# Patient Record
Sex: Male | Born: 1940
Health system: Southern US, Community
[De-identification: ages and names within clinical notes are randomized; demographics above are authoritative.]

## PROBLEM LIST (undated history)

## (undated) DIAGNOSIS — IMO0001 Reserved for inherently not codable concepts without codable children: Secondary | ICD-10-CM

## (undated) DIAGNOSIS — Z933 Colostomy status: Secondary | ICD-10-CM

## (undated) DIAGNOSIS — N400 Enlarged prostate without lower urinary tract symptoms: Secondary | ICD-10-CM

## (undated) DIAGNOSIS — K439 Ventral hernia without obstruction or gangrene: Secondary | ICD-10-CM

## (undated) DIAGNOSIS — K5720 Diverticulitis of large intestine with perforation and abscess without bleeding: Secondary | ICD-10-CM

## (undated) DIAGNOSIS — M1990 Unspecified osteoarthritis, unspecified site: Secondary | ICD-10-CM

## (undated) DIAGNOSIS — G4733 Obstructive sleep apnea (adult) (pediatric): Secondary | ICD-10-CM

## (undated) DIAGNOSIS — IMO0002 Reserved for concepts with insufficient information to code with codable children: Secondary | ICD-10-CM

## (undated) DIAGNOSIS — Z9989 Dependence on other enabling machines and devices: Secondary | ICD-10-CM

## (undated) DIAGNOSIS — R011 Cardiac murmur, unspecified: Secondary | ICD-10-CM

## (undated) DIAGNOSIS — J449 Chronic obstructive pulmonary disease, unspecified: Secondary | ICD-10-CM

## (undated) DIAGNOSIS — J189 Pneumonia, unspecified organism: Secondary | ICD-10-CM

## (undated) DIAGNOSIS — J329 Chronic sinusitis, unspecified: Secondary | ICD-10-CM

## (undated) DIAGNOSIS — E11621 Type 2 diabetes mellitus with foot ulcer: Secondary | ICD-10-CM

## (undated) DIAGNOSIS — L97509 Non-pressure chronic ulcer of other part of unspecified foot with unspecified severity: Secondary | ICD-10-CM

## (undated) DIAGNOSIS — G629 Polyneuropathy, unspecified: Secondary | ICD-10-CM

## (undated) HISTORY — DX: Dependence on other enabling machines and devices: Z99.89

## (undated) HISTORY — PX: CARDIAC CATHETERIZATION: SHX172

## (undated) HISTORY — PX: OTHER SURGICAL HISTORY: SHX169

## (undated) HISTORY — DX: Obstructive sleep apnea (adult) (pediatric): G47.33

## (undated) HISTORY — PX: INCISE AND DRAIN ABCESS: PRO64

## (undated) HISTORY — DX: Benign prostatic hyperplasia without lower urinary tract symptoms: N40.0

## (undated) HISTORY — PX: CHOLECYSTECTOMY: SHX55

## (undated) SURGERY — Surgical Case
Anesthesia: *Unknown

---

## 2002-02-25 ENCOUNTER — Encounter: Payer: Self-pay | Admitting: Otolaryngology

## 2002-02-25 ENCOUNTER — Encounter: Admission: RE | Admit: 2002-02-25 | Discharge: 2002-02-25 | Payer: Self-pay | Admitting: Otolaryngology

## 2004-11-25 ENCOUNTER — Encounter: Admission: RE | Admit: 2004-11-25 | Discharge: 2004-11-25 | Payer: Self-pay | Admitting: Family Medicine

## 2006-05-08 ENCOUNTER — Ambulatory Visit: Payer: Self-pay

## 2009-08-28 ENCOUNTER — Ambulatory Visit: Payer: Self-pay | Admitting: Family Medicine

## 2009-08-28 DIAGNOSIS — R7309 Other abnormal glucose: Secondary | ICD-10-CM | POA: Insufficient documentation

## 2009-08-28 DIAGNOSIS — N400 Enlarged prostate without lower urinary tract symptoms: Secondary | ICD-10-CM | POA: Insufficient documentation

## 2009-08-28 DIAGNOSIS — G4733 Obstructive sleep apnea (adult) (pediatric): Secondary | ICD-10-CM | POA: Insufficient documentation

## 2009-10-23 ENCOUNTER — Emergency Department: Payer: Self-pay

## 2009-10-24 ENCOUNTER — Inpatient Hospital Stay (HOSPITAL_COMMUNITY): Admission: EM | Admit: 2009-10-24 | Discharge: 2009-10-26 | Payer: Self-pay | Admitting: General Surgery

## 2009-11-02 ENCOUNTER — Ambulatory Visit (HOSPITAL_BASED_OUTPATIENT_CLINIC_OR_DEPARTMENT_OTHER): Admission: RE | Admit: 2009-11-02 | Discharge: 2009-11-03 | Payer: Self-pay | Admitting: Orthopedic Surgery

## 2009-11-03 ENCOUNTER — Telehealth: Payer: Self-pay | Admitting: Family Medicine

## 2009-11-05 ENCOUNTER — Telehealth (INDEPENDENT_AMBULATORY_CARE_PROVIDER_SITE_OTHER): Payer: Self-pay

## 2009-11-05 ENCOUNTER — Emergency Department (HOSPITAL_COMMUNITY): Admission: EM | Admit: 2009-11-05 | Discharge: 2009-11-05 | Payer: Self-pay | Admitting: Emergency Medicine

## 2009-11-05 ENCOUNTER — Telehealth: Payer: Self-pay | Admitting: Family Medicine

## 2010-02-26 ENCOUNTER — Ambulatory Visit: Payer: Self-pay | Admitting: Family Medicine

## 2010-02-26 DIAGNOSIS — G8921 Chronic pain due to trauma: Secondary | ICD-10-CM | POA: Insufficient documentation

## 2010-03-01 LAB — CONVERTED CEMR LAB
AST: 18 units/L (ref 0–37)
Albumin: 4 g/dL (ref 3.5–5.2)
BUN: 21 mg/dL (ref 6–23)
CO2: 24 meq/L (ref 19–32)
Calcium: 9.1 mg/dL (ref 8.4–10.5)
Chloride: 106 meq/L (ref 96–112)
Cholesterol: 197 mg/dL (ref 0–200)
Glucose, Bld: 108 mg/dL — ABNORMAL HIGH (ref 70–99)
HDL: 45 mg/dL (ref >39)
Hgb A1c MFr Bld: 5.8 % — ABNORMAL HIGH (ref <5.7)
Potassium: 3.9 meq/L (ref 3.5–5.3)

## 2010-03-03 ENCOUNTER — Ambulatory Visit: Payer: Self-pay | Admitting: Family Medicine

## 2010-03-03 DIAGNOSIS — J018 Other acute sinusitis: Secondary | ICD-10-CM

## 2010-03-15 ENCOUNTER — Telehealth: Payer: Self-pay | Admitting: Family Medicine

## 2010-03-26 ENCOUNTER — Encounter: Payer: Self-pay | Admitting: Family Medicine

## 2010-03-29 ENCOUNTER — Telehealth: Payer: Self-pay | Admitting: Family Medicine

## 2010-04-01 ENCOUNTER — Telehealth: Payer: Self-pay | Admitting: Family Medicine

## 2010-04-28 ENCOUNTER — Telehealth: Payer: Self-pay | Admitting: Family Medicine

## 2010-05-18 ENCOUNTER — Telehealth: Payer: Self-pay | Admitting: Family Medicine

## 2010-05-28 ENCOUNTER — Telehealth: Payer: Self-pay | Admitting: Family Medicine

## 2010-06-23 ENCOUNTER — Telehealth: Payer: Self-pay | Admitting: Family Medicine

## 2010-06-25 ENCOUNTER — Telehealth: Payer: Self-pay | Admitting: Family Medicine

## 2010-07-01 ENCOUNTER — Ambulatory Visit: Payer: Self-pay | Admitting: Family Medicine

## 2010-07-01 DIAGNOSIS — E785 Hyperlipidemia, unspecified: Secondary | ICD-10-CM

## 2010-07-02 ENCOUNTER — Encounter: Payer: Self-pay | Admitting: Family Medicine

## 2010-07-02 LAB — CONVERTED CEMR LAB
ALT: 22 units/L (ref 0–53)
AST: 20 units/L (ref 0–37)
Albumin: 3.9 g/dL (ref 3.5–5.2)
Alkaline Phosphatase: 54 units/L (ref 39–117)
Direct LDL: 114.7 mg/dL
Total CHOL/HDL Ratio: 5
VLDL: 42.4 mg/dL — ABNORMAL HIGH (ref 0.0–40.0)

## 2010-07-23 ENCOUNTER — Telehealth: Payer: Self-pay | Admitting: Family Medicine

## 2010-08-18 ENCOUNTER — Telehealth: Payer: Self-pay | Admitting: Family Medicine

## 2010-09-17 ENCOUNTER — Telehealth: Payer: Self-pay | Admitting: Family Medicine

## 2010-10-13 ENCOUNTER — Ambulatory Visit
Admission: RE | Admit: 2010-10-13 | Discharge: 2010-10-13 | Payer: Self-pay | Source: Home / Self Care | Attending: Family Medicine | Admitting: Family Medicine

## 2010-10-13 DIAGNOSIS — M2140 Flat foot [pes planus] (acquired), unspecified foot: Secondary | ICD-10-CM | POA: Insufficient documentation

## 2010-10-14 ENCOUNTER — Ambulatory Visit
Admission: RE | Admit: 2010-10-14 | Discharge: 2010-10-14 | Payer: Self-pay | Source: Home / Self Care | Attending: Family Medicine | Admitting: Family Medicine

## 2010-10-14 DIAGNOSIS — Q666 Other congenital valgus deformities of feet: Secondary | ICD-10-CM | POA: Insufficient documentation

## 2010-10-14 DIAGNOSIS — M2020 Hallux rigidus, unspecified foot: Secondary | ICD-10-CM | POA: Insufficient documentation

## 2010-10-14 DIAGNOSIS — G5760 Lesion of plantar nerve, unspecified lower limb: Secondary | ICD-10-CM | POA: Insufficient documentation

## 2010-10-14 DIAGNOSIS — M7750 Other enthesopathy of unspecified foot: Secondary | ICD-10-CM | POA: Insufficient documentation

## 2010-10-14 DIAGNOSIS — M76829 Posterior tibial tendinitis, unspecified leg: Secondary | ICD-10-CM | POA: Insufficient documentation

## 2010-10-20 ENCOUNTER — Telehealth: Payer: Self-pay | Admitting: Family Medicine

## 2010-11-11 NOTE — Assessment & Plan Note (Signed)
Summary: F/U AND TALK ABOUT BLOOD WORK/DLO   Vital Signs:  Patient profile:   70 year old male Height:      72 inches Weight:      242.13 pounds BMI:     32.96 Temp:     97.8 degrees F oral Pulse rate:   76 / minute Pulse rhythm:   regular BP sitting:   140 / 72  (left arm) Cuff size:   regular  Vitals Entered By: Linde Gillis CMA Duncan Dull) (Feb 26, 2010 3:22 PM) CC: follow-up visit   History of Present Illness: 70 yo here for multiple concerns.   1.  Borderline DM- was started on Metformin 500 mg two times a day last year.  Has never checked his blood sugar but has a meter at home.  We never received his old records.  Denies any symptoms of hypoglycemia.  2.  Chronic pain- fell off of the back of a truck in January.  Had bilateral hand surgery and shoulder surgery.  Still having terrible pain and difficulty sleeping.  Having to take Vicodin and Lunesta at night, which make him feel very drugged and sleepy the following morning. Still in PT, feels it is helping.      Current Medications (verified): 1)  Flomax 0.4 Mg Caps (Tamsulosin Hcl) .... Take 2 Tablet By Mouth At At Bedtime 2)  Aleve 220 Mg Tabs (Naproxen Sodium) .... As Needed For Arthritis 3)  Metformin Hcl 500 Mg Tabs (Metformin Hcl) .... Take 1 Tab Twice Daily 4)  Vicodin 5-500 Mg Tabs (Hydrocodone-Acetaminophen) .... Take One Tablet By Mouth Every 4-6 Hours As Needed For Pain 5)  Tramadol Hcl 50 Mg  Tabs (Tramadol Hcl) .Marland Kitchen.. 1 Tab By Mouth Two Times A Day As Needed Pain  Allergies (verified): No Known Drug Allergies  Review of Systems      See HPI General:  Complains of fatigue. Endo:  Denies excessive thirst and excessive urination.  Physical Exam  General:  Well-developed,well-nourished,in no acute distress; alert,appropriate and cooperative throughout examination Psych:  Cognition and judgment appear intact. Alert and cooperative with normal attention span and concentration. No apparent delusions, illusions,  hallucinations   Impression & Recommendations:  Problem # 1:  CHRONIC PAIN DUE TO TRAUMA (ICD-338.21) Assessment Deteriorated Time spent with patient 25 minutes, more than 50% of this time was spent counseling patient on chronic pain.  I do not have records from [facility] or from hand/shoulder surgeon.  Wife will get them for me. I explained that VIcodin and LUnesta combination are likely to blame for him feeling so fatigued all day. Discussed several options, agreed to try Tramadol and if necessary add Lunesta to Tramadol.  Less side effects, less addiction potential. Check BMET, LFTs today since he has been on Vicodin since January. Orders: Prescription Created Electronically [phone])  Problem # 2:  DIABETES MELLITUS, BORDERLINE (ICD-790.29) recheck a1c today. His updated medication list for this problem includes:    Metformin Hcl 500 Mg Tabs (Metformin hcl) .Marland Kitchen... Take 1 tab twice daily  Complete Medication List: 1)  Flomax 0.4 Mg Caps (Tamsulosin hcl) .... Take 2 tablet by mouth at at bedtime 2)  Aleve 220 Mg Tabs (Naproxen sodium) .... As needed for arthritis 3)  Metformin Hcl 500 Mg Tabs (Metformin hcl) .... Take 1 tab twice daily 4)  Vicodin 5-500 Mg Tabs (Hydrocodone-acetaminophen) .... Take one tablet by mouth every 4-6 hours as needed for pain 5)  Tramadol Hcl 50 Mg Tabs (Tramadol hcl) .Marland KitchenMarland KitchenMarland Kitchen  1 tab by mouth two times a day as needed pain  Patient Instructions: 1)  Good to see you. 2)  Let's try the Tramadol with the Lunesta, hold off on the Vicodin. 3)  labwork: fasting lipid, hepatic panel (V77.91), BMET (V58.69), a1c (250.00) Prescriptions: TRAMADOL HCL 50 MG  TABS (TRAMADOL HCL) 1 tab by mouth two times a day as needed pain  #60 x 0   Entered and Authorized by:   Ruthe Mannan MD   Signed by:   Ruthe Mannan MD on 02/26/2010   Method used:   [facility]        [facility] [phone][facility] (retail)       [address]       [place], [place]  [address]       Ph: [phone] or [phone]       Fax: [phone]   RxID:   [id number]   Current Allergies (reviewed today): No known allergies   Appended Document: F/U AND TALK ABOUT BLOOD WORK/DLO

## 2010-11-11 NOTE — Assessment & Plan Note (Signed)
Summary: MED FOLLOWUP/RBH   Vital Signs:  Patient profile:   70 year old male Height:      72 inches Weight:      236 pounds BMI:     32.12 Temp:     98.2 degrees F oral Pulse rate:   80 / minute Pulse rhythm:   regular BP sitting:   122 / 70  (right arm) Cuff size:   regular  Vitals Entered By: Linde Gillis CMA Duncan Dull) (July 01, 2010 12:15 PM) CC: medication follow up   History of Present Illness: 70 yo here for follow pain, DM, and HLD.   1.  Borderline DM- was started on Metformin 500 mg two times a day last year.  Has never checked his blood sugar but has a meter at home but backed down to 500 mg daily because was feeling a litte shaky.  No syncope, nausea, or vomiting.  Has improved since he backed off to 500 mg daily.    2.  Chronic pain- fell off of the back of a truck in January.  Had bilateral hand surgery and shoulder surgery.  Still having terrible pain and difficulty sleeping.  Having to take Vicodin and Lunesta at night, which make him feel very drugged and sleepy the following morning. Still in PT, feels it is helping.  Tramadol has helped a great deal but sometimes has to take 50 mg three times a day.  Spirits are better since he is slowly seeing improvement.  3.  Elevated TG- TG 362 in 02/2010.  LDL and HDL within normal limits.  He has been working on diet, lost 5 pounds since last office visit.  Current Medications (verified): 1)  Flomax 0.4 Mg Caps (Tamsulosin Hcl) .... Take 2 Tablet By Mouth At At Bedtime 2)  Aleve 220 Mg Tabs (Naproxen Sodium) .... As Needed For Arthritis 3)  Metformin Hcl 500 Mg Tabs (Metformin Hcl) .... Take 1 Tab By Mouth Daily 4)  Vicodin 5-500 Mg Tabs (Hydrocodone-Acetaminophen) .... Take One Tablet By Mouth Every 4-6 Hours As Needed For Pain 5)  Tramadol Hcl 50 Mg  Tabs (Tramadol Hcl) .Marland Kitchen.. 1 Tab By Mouth Every 6 Hours As Needed For Pain. 6)  Lunesta 3 Mg Tabs (Eszopiclone) .... One By Mouth At Bedtime As Needed  Allergies  (verified): No Known Drug Allergies  Past History:  Past Medical History: Last updated: 12-Sep-2009 borderline DM OSA- uses CPAP at night BPH  Past Surgical History: Last updated: 09/12/2009 Cholecystectomy 1986  Family History: Last updated: 09/12/2009 Mom died at 28- had DM, skin cancer Dad died at 58 from leukemia  Social History: Last updated: 09/12/09 LIves in [place] with his wife.  Former smoker.  Does not drink.  Works as a Games developer.  Review of Systems      See HPI General:  Denies malaise. Eyes:  Denies blurring. CV:  Denies chest pain or discomfort. Resp:  Denies shortness of breath. GI:  Denies abdominal pain and change in bowel habits.  Physical Exam  General:  Well-developed,well-nourished,in no acute distress; alert,appropriate and cooperative throughout examination Eyes:  vision grossly intact, pupils equal, pupils round, and pupils reactive to light.   Lungs:  Normal respiratory effort, chest expands symmetrically. Lungs are clear to auscultation, no crackles or wheezes. Heart:  Normal rate and regular rhythm. S1 and S2 normal without gallop, murmur, click, rub or other extra sounds. Abdomen:  Bowel sounds positive,abdomen soft and non-tender without masses, organomegaly or hernias noted. Extremities:  No  clubbing, cyanosis, edema, or deformity noted with normal full range of motion of all joints.   Neurologic:  alert & oriented X3.   Skin:  Intact without suspicious lesions or rashes Psych:  Cognition and judgment appear intact. Alert and cooperative with normal attention span and concentration. No apparent delusions, illusions, hallucinations   Impression & Recommendations:  Problem # 1:  HYPERLIPIDEMIA (ICD-272.4) Assessment Unchanged Tg likely improved with lifestyle changes.  Will recheck today.  Congratulated on weight loss. Orders: TLB-Lipid Panel (80061-LIPID)  Problem # 2:  DIABETES MELLITUS, BORDERLINE  (ICD-790.29) Assessment: Unchanged Will decrease Metformin to 500 mg daily and recheck a1c today. His updated medication list for this problem includes:    Metformin Hcl 500 Mg Tabs (Metformin hcl) .Marland Kitchen... Take 1 tab by mouth daily  Orders: Venipuncture (16109) TLB-A1C / Hgb A1C (Glycohemoglobin) (83036-A1C) Prescription Created Electronically [phone])  Problem # 3:  CHRONIC PAIN DUE TO TRAUMA (ICD-338.21) Assessment: Improved Slowly improving.  Continue PT and current pain meds.   Orders: Prescription Created Electronically [id number])  Complete Medication List: 1)  Flomax 0.4 Mg Caps (Tamsulosin hcl) .... Take 2 tablet by mouth at at bedtime 2)  Aleve 220 Mg Tabs (Naproxen sodium) .... As needed for arthritis 3)  Metformin Hcl 500 Mg Tabs (Metformin hcl) .... Take 1 tab by mouth daily 4)  Vicodin 5-500 Mg Tabs (Hydrocodone-acetaminophen) .... Take one tablet by mouth every 4-6 hours as needed for pain 5)  Tramadol Hcl 50 Mg Tabs (Tramadol hcl) .Marland Kitchen.. 1 tab by mouth every 6 hours as needed for pain. 6)  Lunesta 3 Mg Tabs (Eszopiclone) .... One by mouth at bedtime as needed  Other Orders: TLB-Hepatic/Liver Function Pnl (80076-HEPATIC) Prescriptions: TRAMADOL HCL 50 MG  TABS (TRAMADOL HCL) 1 tab by mouth every 6 hours as needed for pain.  #180 x 3   Entered and Authorized by:   Ruthe Mannan MD   Signed by:   Ruthe Mannan MD on 07/01/2010   Method used:   [facility] to        [facility] [phone]* (retail)       [address]       [place], [place]  [address]       Ph: [phone] or [phone]       Fax: [phone]   RxID:   [id number]   Current Allergies (reviewed today): No known allergies

## 2010-11-11 NOTE — Progress Notes (Signed)
Summary: refill request for tramadol  Phone Note Refill Request Call back at Home Phone [phone] Call back at [phone] Message from:  wife  Refills Requested: Medication #1:  TRAMADOL HCL 50 MG  TABS 1 tab by mouth two times a day as needed pain [facility] [place]  Pt is taking one in the morning and one at night so he will need and increased quantity.  Initial call taken by: Lowella Petties CMA,  March 29, 2010 3:55 PM    Prescriptions: TRAMADOL HCL 50 MG  TABS (TRAMADOL HCL) 1 tab by mouth two times a day as needed pain  #120 x 0   Entered and Authorized by:   Ruthe Mannan MD   Signed by:   Ruthe Mannan MD on 03/29/2010   Method used:   Electronically to        [facility] [phone]* (retail)       [address]       [place], [place]  [address]       Ph: [phone] or [phone]       Fax: [phone]   RxID:   [id number]

## 2010-11-11 NOTE — Progress Notes (Signed)
Summary: requests refill on abx  Phone Note Call from Patient Call back at Home Phone [phone]   Caller: Spouse Call For: Ruthe Mannan MD Summary of Call: Pt's wife states pt still has sinus sxs and chest congestion, still has loose productive cough.  Wife is asking if he can get a refill on abx.  [facility] [place]  He is still coughing up and blowing out yellow stuff. Initial call taken by: Lowella Petties CMA,  March 15, 2010 9:55 AM    New/Updated Medications: AZITHROMYCIN 250 MG  TABS (AZITHROMYCIN) 2 by  mouth today and then 1 daily for 4 days Prescriptions: AZITHROMYCIN 250 MG  TABS (AZITHROMYCIN) 2 by  mouth today and then 1 daily for 4 days  #6 x 0   Entered and Authorized by:   Ruthe Mannan MD   Signed by:   Ruthe Mannan MD on 03/15/2010   Method used:   Electronically to        [facility] [phone]* (retail)       [address]       [place], [place]  [address]       Ph: [phone] or [phone]       Fax: [phone]   RxID:   [id number]   Appended Document: requests refill on abx Patient's spouse advised as instructed via telephone.  [facility]

## 2010-11-11 NOTE — Progress Notes (Signed)
Summary: Rx Tramadol  Phone Note Refill Request Call back at [phone] Message from:  [facility] [facility] on June 23, 2010 11:08 AM  Refills Requested: Medication #1:  TRAMADOL HCL 50 MG  TABS 1 tab by mouth two times a day as needed pain   Last Refilled: 05/18/2010 Received e-script request please advise.     Method Requested: Telephone to Pharmacy Initial call taken by: Linde Gillis CMA Duncan Dull),  June 23, 2010 11:09 AM    Prescriptions: TRAMADOL HCL 50 MG  TABS (TRAMADOL HCL) 1 tab by mouth two times a day as needed pain  #120 x 0   Entered and Authorized by:   Ruthe Mannan MD   Signed by:   Ruthe Mannan MD on 06/23/2010   Method used:   Electronically to        [facility] [phone]* (retail)       [address]       [place], [place]  [address]       Ph: [phone] or [phone]       Fax: [phone]   RxID:   [id number]

## 2010-11-11 NOTE — Progress Notes (Signed)
Summary: tramadol  Phone Note Refill Request Call back at Home Phone [phone] Message from:  Patient on May 18, 2010 2:51 PM  Refills Requested: Medication #1:  TRAMADOL HCL 50 MG  TABS 1 tab by mouth two times a day as needed pain [facility] [place]  Initial call taken by: Melody Comas,  May 18, 2010 2:51 PM    Prescriptions: TRAMADOL HCL 50 MG  TABS (TRAMADOL HCL) 1 tab by mouth two times a day as needed pain  #120 x 0   Entered and Authorized by:   Ruthe Mannan MD   Signed by:   Ruthe Mannan MD on 05/18/2010   Method used:   Electronically to        [facility] [phone]* (retail)       [address]       [place], [place]  [address]       Ph: [phone] or [phone]       Fax: [phone]   RxID:   [id number]

## 2010-11-11 NOTE — Progress Notes (Signed)
Summary: refill request for lunesta  Phone Note Refill Request Message from:  Fax from Pharmacy  Refills Requested: Medication #1:  LUNESTA 3 MG TABS one by mouth at bedtime as needed.   Last Refilled: 06/25/2010 [facility] [place], [phone]  Initial call taken by: Lowella Petties CMA,  July 23, 2010 2:49 PM  Follow-up for Phone Call        Rx called to pharmacy Follow-up by: Linde Gillis CMA Duncan Dull),  July 23, 2010 4:12 PM    Prescriptions: LUNESTA 3 MG TABS (ESZOPICLONE) one by mouth at bedtime as needed  #30 x 0   Entered and Authorized by:   Ruthe Mannan MD   Signed by:   Ruthe Mannan MD on 07/23/2010   Method used:   Telephoned to ...       [facility] [phone]* (retail)       [address]       [place], [place]  [address]       Ph: [phone] or [phone]       Fax: [phone]   RxID:   [id number]

## 2010-11-11 NOTE — Progress Notes (Signed)
Summary: lunesta  Phone Note Refill Request Message from:  Fax from Pharmacy on August 18, 2010 11:31 AM  Refills Requested: Medication #1:  LUNESTA 3 MG TABS one by mouth at bedtime as needed.   Last Refilled: 07/23/2010 [facility] [place] [facility] [phone]  Initial call taken by: Melody Comas,  August 18, 2010 11:33 AM  Follow-up for Phone Call        Rx called to pharmacy Follow-up by: Linde Gillis CMA Duncan Dull),  August 18, 2010 11:44 AM    Prescriptions: LUNESTA 3 MG TABS (ESZOPICLONE) one by mouth at bedtime as needed  #30 x 0   Entered and Authorized by:   Ruthe Mannan MD   Signed by:   Ruthe Mannan MD on 08/18/2010   Method used:   Telephoned to ...       [facility] [phone]* (retail)       [address]       [place], [place]  [address]       Ph: [phone] or [phone]       Fax: [phone]   RxID:   [id number]

## 2010-11-11 NOTE — Assessment & Plan Note (Signed)
Summary: RIGHT FOOT/LEG SWELLING PER ARON/RBH   Vital Signs:  Patient profile:   70 year old male Height:      72 inches Weight:      238.75 pounds BMI:     32.50 Temp:     98.1 degrees F oral Pulse rate:   83 / minute Pulse rhythm:   regular BP sitting:   120 / 70  (left arm) Cuff size:   large  Vitals Entered By: Benny Lennert CMA Duncan Dull) (October 14, 2010 10:51 AM)  History of Present Illness: Chief complaint Right foot and leg swelling    Dr. Dayton Martes has asked that I evalute the patient for foot and leg pain, which is happening more on the right compared to left - but is longstanding.  Metatarsalgia and pain B forefeet. numbness around ball of feet and right is having swelling aching. Numbness mostly between the 4th and 5th MT heads, but having some elsewhere. This is greater, R > L.  supervisor -- everything, insurance related damage. Has been up on his feet on concrete for 50 years, up and down ladders, etc.   No specific trauma, but pain in the medial foot and medial aspect of lower leg with some mild swelling.     Allergies (verified): No Known Drug Allergies  Past History:  Past medical, surgical, family and social histories (including risk factors) reviewed, and no changes noted (except as noted below).  Past Medical History: Reviewed history from 08/28/2009 and no changes required. borderline DM OSA- uses CPAP at night BPH  Past Surgical History: Reviewed history from 08/28/2009 and no changes required. Cholecystectomy 1986  Family History: Reviewed history from 08/28/2009 and no changes required. Mom died at 68- had DM, skin cancer Dad died at 42 from leukemia  Social History: Reviewed history from 08/28/2009 and no changes required. LIves in [place] with his wife.  Former smoker.  Does not drink.  Works as a Games developer.  Review of Systems       REVIEW OF SYSTEMS  GEN: No systemic complaints, no fevers, chills, sweats, or other  acute illnesses MSK: Detailed in the HPI GI: tolerating PO intake without difficulty Neuro: No numbness, parasthesias, or tingling associated. Otherwise the pertinent positives of the ROS are noted above.    Physical Exam  General:  GEN: Well-developed,well-nourished,in no acute distress; alert,appropriate and cooperative throughout examination HEENT: Normocephalic and atraumatic without obvious abnormalities. No apparent alopecia or balding. Ears, externally no deformities PULM: Breathing comfortably in no respiratory distress EXT: No clubbing, cyanosis, or edema PSYCH: Normally interactive. Cooperative during the interview. Pleasant. Friendly and conversant. Not anxious or depressed appearing. Normal, full affect.  Msk:  walks with PRONATION, SEVERE, OUTWARD TURNED SOME ON THE RIGHT Echymosis: no Edema: no ROM: full LE B MT pain: YES, WITH SQUEEZE, MORE BETWEEN 3-5 ON R Callus pattern: 2-3 MT HEADS Lateral Mall: NT Medial Mall: NT Talus: NT Navicular: NT Cuboid: NT Calcaneous: NT Metatarsals: NT 5th MT: NT Phalanges: NT Achilles: NT Plantar Fascia: NT Fat Pad: NT Peroneals: NT Post Tib: TTP ALONG LENGTH Great Toe: R TOE WITH RELATIVE DECREASED MOTION Ant Drawer: neg ATFL: NT CFL: NT Deltoid: NT Long arch: SEVERE BREAKDOWN Transverse arch: COMPLETE BREAKDOWN, DROPPED ALL MT HEADS Hindfoot breakdown: L > R HINDFOOT VALGUS   Impression & Recommendations:  Problem # 1:  TIBIALIS TENDINITIS (ICD-726.72) Assessment New Severe foot breakdown throughout, i think hallux rigidus combined with pes planus and hindfoot breakdown is overworking posterior tib  tendons from attachment at navicular through the medial lower extremity.  Also with significant numbness, i suspect more from neuroma formation b/c localized, cannot rule out DM neuropathy component.  He has a brand new pair of shoes, with great support and native insole in it that he got yesterday from [employer].  I placed B MT cookies to help with metatarsalgia and hopefully take pressure off neuroma - I suggested injecting it, which I think would help a lot, but he wanted to proceed and try this first before.  Also, placed medial heel posts All of this will hopefull mechanically unload his PT.  I am bringing back in a month -- if still symptomatic, he is a great orthotics candidate, and i will probably make plastazote orthotic given DM.  >30 minutes spent in face to face time with patient, >50% spent in counselling or coordination of care: as above, discussion, gait analysis, anatomy discussion.  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 Take 2 tablet by mouth at at bedtime 2)  Aleve 220 Mg Tabs (Naproxen sodium) .... As needed for arthritis 3)  Metformin Hcl 500 Mg Tabs (Metformin hcl) .... Take 1 tab by mouth daily 4)  Vicodin 5-500 Mg Tabs (Hydrocodone-acetaminophen) .... Take one tablet by mouth every 4-6 hours as needed for pain 5)  Tramadol Hcl 50 Mg Tabs (Tramadol hcl) .Marland Kitchen.. 1 tab by mouth every 6 hours as needed for pain. 6)  Lunesta 3 Mg Tabs (Eszopiclone) .... One by mouth at bedtime as needed 7)  Azithromycin 250 Mg Tabs (Azithromycin) .... 2 by  mouth today and then 1 daily for 4 days  Patient Instructions: 1)  recheck Dr. Patsy Lager in 1 month 2)  8 AM or 2 PM appt, 45 minutes   Orders Added: 1)  Est. Patient Level IV [84696] 2)  Metatarsal Pads [L3649] 3)  Posting Heel Wedges  [L3350]    Current Allergies (reviewed today): No known allergies

## 2010-11-11 NOTE — Assessment & Plan Note (Signed)
Summary: SHOULDER,BACK PAIN,ST,CONGESTION   Vital Signs:  Patient profile:   70 year old male Height:      72 inches Weight:      240.6 pounds BMI:     32.75 Temp:     98.5 degrees F oral Pulse rate:   76 / minute Pulse rhythm:   regular BP sitting:   140 / 80  (left arm) Cuff size:   regular  Vitals Entered By: Benny Lennert CMA Duncan Dull) (Mar 03, 2010 10:58 AM)  History of Present Illness: 70 yo with over 5 days of worsening congestion.  Started with runny nose, now has severe facial pressure, body aches, and subjective fevers. Taking Tylenol, Mucinex, cough medicine with codeine without much relief of symptoms. Back hurts when he has coughing spells.  Just started coughing up yellow sputum. No wheezing, SOB, or CP.  Throat is raw.  Current Medications (verified): 1)  Flomax 0.4 Mg Caps (Tamsulosin Hcl) .... Take 2 Tablet By Mouth At At Bedtime 2)  Aleve 220 Mg Tabs (Naproxen Sodium) .... As Needed For Arthritis 3)  Metformin Hcl 500 Mg Tabs (Metformin Hcl) .... Take 1 Tab Twice Daily 4)  Vicodin 5-500 Mg Tabs (Hydrocodone-Acetaminophen) .... Take One Tablet By Mouth Every 4-6 Hours As Needed For Pain 5)  Tramadol Hcl 50 Mg  Tabs (Tramadol Hcl) .Marland Kitchen.. 1 Tab By Mouth Two Times A Day As Needed Pain 6)  Lunesta 3 Mg Tabs (Eszopiclone) .... One By Mouth At Bedtime As Needed 7)  Azithromycin 250 Mg  Tabs (Azithromycin) .... 2 By  Mouth Today and Then 1 Daily For 4 Days  Allergies (verified): No Known Drug Allergies  Review of Systems      See HPI General:  Complains of chills and fever. ENT:  Complains of earache, nasal congestion, postnasal drainage, sinus pressure, and sore throat. CV:  Denies chest pain or discomfort. Resp:  Complains of cough and sputum productive; denies shortness of breath and wheezing. GI:  Denies abdominal pain, diarrhea, nausea, and vomiting.  Physical Exam  General:  Well-developed,well-nourished,in no acute distress; alert,appropriate and  cooperative throughout examination, congested Ears:  TMs retracted bilaterally Nose:  boggy turbinates. sinues TTP throughout Mouth:  pharyngeal erythema.   Lungs:  Normal respiratory effort, chest expands symmetrically. Lungs are clear to auscultation, no crackles or wheezes. Heart:  Normal rate and regular rhythm. S1 and S2 normal without gallop, murmur, click, rub or other extra sounds. Extremities:  No clubbing, cyanosis, edema, or deformity noted with normal full range of motion of all joints.   Psych:  Cognition and judgment appear intact. Alert and cooperative with normal attention span and concentration. No apparent delusions, illusions, hallucinations   Impression & Recommendations:  Problem # 1:  OTHER ACUTE SINUSITIS (ICD-461.8) Assessment New Given duration and progression of symptoms, will treat for bacterial sinusitis with Abx. See pt instructions for details. His updated medication list for this problem includes:    Azithromycin 250 Mg Tabs (Azithromycin) .Marland Kitchen... 2 by  mouth today and then 1 daily for 4 days  Orders: Prescription Created Electronically [phone])  Complete Medication List: 1)  Flomax 0.4 Mg Caps (Tamsulosin hcl) .... Take 2 tablet by mouth at at bedtime 2)  Aleve 220 Mg Tabs (Naproxen sodium) .... As needed for arthritis 3)  Metformin Hcl 500 Mg Tabs (Metformin hcl) .... Take 1 tab twice daily 4)  Vicodin 5-500 Mg Tabs (Hydrocodone-acetaminophen) .... Take one tablet by mouth every 4-6 hours as needed for pain 5)  Tramadol Hcl 50 Mg Tabs (Tramadol hcl) .Marland Kitchen.. 1 tab by mouth two times a day as needed pain 6)  Lunesta 3 Mg Tabs (Eszopiclone) .... One by mouth at bedtime as needed 7)  Azithromycin 250 Mg Tabs (Azithromycin) .... 2 by  mouth today and then 1 daily for 4 days  Patient Instructions: 1)  Take zpack as directed.  Drink lots of fluids.  Treat sympotmatically with Mucinex, nasal saline irrigation, and Tylenol/Ibuprofen.You can use warm compresses.   Cough suppressant at night. Call if not improving as expected in 5-7 days.  Prescriptions: AZITHROMYCIN 250 MG  TABS (AZITHROMYCIN) 2 by  mouth today and then 1 daily for 4 days  #6 x 0   Entered and Authorized by:   Ruthe Mannan MD   Signed by:   Ruthe Mannan MD on 03/03/2010   Method used:   [facility]        [facility] [phone]* (retail)       [address]       [place], [place]  [address]       Ph: [phone] or [phone]       Fax: [phone]   RxID:   [id number]   Current Allergies (reviewed today): No known allergies

## 2010-11-11 NOTE — Progress Notes (Signed)
Summary: refill request for lunesta  Phone Note Refill Request Message from:  Fax from Pharmacy  Refills Requested: Medication #1:  LUNESTA 3 MG TABS one by mouth at bedtime as needed.   Last Refilled: 03/02/2010 [facility], [phone].  Initial call taken by: Lowella Petties CMA,  April 01, 2010 10:22 AM  Follow-up for Phone Call        [facility] Follow-up by: Lowella Petties CMA,  April 01, 2010 11:03 AM    Prescriptions: LUNESTA 3 MG TABS (ESZOPICLONE) one by mouth at bedtime as needed  #30 x 0   Entered and Authorized by:   Ruthe Mannan MD   Signed by:   Ruthe Mannan MD on 04/01/2010   Method used:   Telephoned to ...       [facility] [phone]* (retail)       [address]       [place], [place]  [address]       Ph: [phone] or [phone]       Fax: [phone]   RxID:   [id number]

## 2010-11-11 NOTE — Progress Notes (Signed)
Summary: Missed Flomax last night  Phone Note Call from Patient Call back at [phone]   Caller: Patient's family member Call For: Ruthe Mannan MD Summary of Call: Pt missed  last night dose of Flomax and pt wants to know if can take last night's dose now and still take his Flomax tonight or what should pt do. Please advise.  Initial call taken by: Lewanda Rife LPN,  November 03, 2009 9:49 AM  Follow-up for Phone Call        As it can lower your blood pressure, I would hold off on taking one now and resume with  tonight's dose. Follow-up by: Ruthe Mannan MD,  November 03, 2009 10:11 AM  Additional Follow-up for Phone Call Additional follow up Details #1::        Patient is at the surgical center now because he had a bad fall last Friday and has had to undergo some surgery on his wrists, had a concussion, etc.  They had put in a catheter and took it out this a.m. but now he cannot urinate and then realized that he didn't take the Flomax last evening.  By the time I could call back, he had already taken his Flomax.  Wife says he probably will not be allowed to go home until he urinates anyway. Additional Follow-up by: Delilah Shan CMA Duncan Dull),  November 03, 2009 10:22 AM    Additional Follow-up for Phone Call Additional follow up Details #2::    Yeah that is something they need to follow at surgical center.  If he is there, his BP is likely being monitored. Follow-up by: Ruthe Mannan MD,  November 03, 2009 10:25 AM

## 2010-11-11 NOTE — Progress Notes (Signed)
Summary: post of issues  Phone Note From Other Clinic Call back at [phone]   Caller: Penni Bombard at Dr. Isabelle Course Call For: Dr Dayton Martes Summary of Call: Penni Bombard called with pt had surgery on 11/02/09 and pt's wife is calling there office about pt not urinating, having nausea and today pt not having BM. Penni Bombard was advised post op problem. I spoke with Jacki Cones in triage also. Initial call taken by: Lewanda Rife LPN,  November 05, 2009 2:52 PM

## 2010-11-11 NOTE — Letter (Signed)
Summary: Records from Dr. Harlene Salts Office 1610 - 2010  Records from Dr. Harlene Salts Office 1993 - 2010   Imported By: Maryln Gottron 05/21/2010 13:15:31  _____________________________________________________________________  External Attachment:    Type:   Image     Comment:   External Document

## 2010-11-11 NOTE — Progress Notes (Signed)
Summary: Constipated  Phone Note Call from Patient Call back at Home Phone [phone]   Caller: Spouse Call For: Ruthe Mannan MD Summary of Call: Patient's wife called, he just had surgery on both wrists and shoulder on 11/02/2009.  He has not had a good bowel movement since before the surgery.  He went a little on 11/01/2009 but it was hard.  He is on pain medication-Hydrocodone.  They have tried milk of magnesia and suppositories and no relief.  He is very uncomfortable, he is catherized as well.   Initial call taken by: Linde Gillis CMA Duncan Dull),  November 05, 2009 4:21 PM  Follow-up for Phone Call        May need to go to ER if he is that uncomfortable he may need to be manually disimpacted. Follow-up by: Ruthe Mannan MD,  November 05, 2009 4:23 PM  Additional Follow-up for Phone Call Additional follow up Details #1::        Patient's wife advised as instructed. Additional Follow-up by: Linde Gillis CMA Duncan Dull),  November 05, 2009 4:36 PM

## 2010-11-11 NOTE — Progress Notes (Signed)
Summary: refill request for lunesta  Phone Note Refill Request Message from:  Fax from Pharmacy  Refills Requested: Medication #1:  LUNESTA 3 MG TABS one by mouth at bedtime as needed.   Last Refilled: 08/18/2010 [facility], [phone].  Initial call taken by: Lowella Petties CMA, AAMA,  September 17, 2010 10:18 AM  Follow-up for Phone Call        Rx called to pharmacy Follow-up by: Linde Gillis CMA Duncan Dull),  September 17, 2010 10:30 AM    Prescriptions: LUNESTA 3 MG TABS (ESZOPICLONE) one by mouth at bedtime as needed  #30 x 0   Entered and Authorized by:   Ruthe Mannan MD   Signed by:   Ruthe Mannan MD on 09/17/2010   Method used:   Telephoned to ...       [facility] [phone]* (retail)       [address]       [place], [place]  [address]       Ph: [phone] or [phone]       Fax: [phone]   RxID:   [id number]

## 2010-11-11 NOTE — Letter (Signed)
Summary: Generic Letter  [place] [facility]  [address] [place], [place] [address]   Phone: [phone]  Fax: [phone]    07/02/2010  [facility] [address] [place], [place]  [address]  Dear Mr. Ugarte,   We have received your lab results and Dr. Dayton Martes says that she is very pleased with your A1C.  She wants to continue to lower the dose of Metformin.  Bad cholesterol, (LDL) is just a little elevated and Good cholesterol (HDL) is a little low.  Keep working on decreasing added sugars, eliminate trans fats, and increase fiber.  We will recheck your labs at your next office visit.    Sincerely,       Linde Gillis CMA (AAMA)for Dr. Ruthe Mannan

## 2010-11-11 NOTE — Progress Notes (Signed)
Summary: refill request for lunesta  Phone Note Refill Request Message from:  Fax from Pharmacy  Refills Requested: Medication #1:  LUNESTA 3 MG TABS one by mouth at bedtime as needed.   Last Refilled: 04/01/2010 [facility], [phone]  Initial call taken by: Lowella Petties CMA,  April 28, 2010 12:24 PM  Follow-up for Phone Call        [facility] [place] Follow-up by: Lowella Petties CMA,  April 28, 2010 2:09 PM    Prescriptions: LUNESTA 3 MG TABS (ESZOPICLONE) one by mouth at bedtime as needed  #30 x 0   Entered and Authorized by:   Ruthe Mannan MD   Signed by:   Ruthe Mannan MD on 04/28/2010   Method used:   Telephoned to ...       [facility] [phone]* (retail)       [address]       [place], [place]  [address]       Ph: [phone] or [phone]       Fax: [phone]   RxID:   [id number]

## 2010-11-11 NOTE — Progress Notes (Signed)
Summary: refill request for lunesta  Phone Note Refill Request Call back at Home Phone [phone] Message from:  wife  Refills Requested: Medication #1:  LUNESTA 3 MG TABS one by mouth at bedtime as needed. [facility] [place].  Initial call taken by: Lowella Petties CMA,  May 28, 2010 8:52 AM  Follow-up for Phone Call        Rx called to pharmacy, left message on machine at home number that Rx was sent in. Follow-up by: Linde Gillis CMA Duncan Dull),  May 28, 2010 10:21 AM    Prescriptions: LUNESTA 3 MG TABS (ESZOPICLONE) one by mouth at bedtime as needed  #30 x 0   Entered and Authorized by:   Ruthe Mannan MD   Signed by:   Ruthe Mannan MD on 05/28/2010   Method used:   Telephoned to ...       [facility] [phone]* (retail)       [address]       [place], [place]  [address]       Ph: [phone] or [phone]       Fax: [phone]   RxID:   [id number]

## 2010-11-11 NOTE — Progress Notes (Signed)
Summary: vision problem  Phone Note Call from Patient Call back at [phone]   Caller: Patient Call For: Dr Hetty Ely Summary of Call: Pt having problems with vision after exercising. Appt made to evaluate. Pt insturcted not to exercise until seen at appt on 11/09/09 at 3:30pm per Dr. Hetty Ely. Initial call taken by: Lewanda Rife LPN,  November 05, 2009 3:06 PM

## 2010-11-11 NOTE — Progress Notes (Signed)
Summary: refill request for lunesta, tramadol  Phone Note Refill Request Call back at [facility] Phone [phone] Message from:  Patient  Refills Requested: Medication #1:  LUNESTA 3 MG TABS one by mouth at bedtime as needed.  Medication #2:  TRAMADOL HCL 50 MG  TABS 1 tab by mouth two times a day as needed pain Phoned request from pt's wife.  She states pt needs an early refill on tramadol because he has had to take 3 a day, instead of 2 for the last few days.  [facility]  Initial call taken by: Lowella Petties CMA,  June 25, 2010 11:40 AM  Follow-up for Phone Call        Rx's called to pharmacy Follow-up by: Linde Gillis CMA Duncan Dull),  June 25, 2010 12:37 PM    Prescriptions: LUNESTA 3 MG TABS (ESZOPICLONE) one by mouth at bedtime as needed  #30 x 0   Entered and Authorized by:   Ruthe Mannan MD   Signed by:   Ruthe Mannan MD on 06/25/2010   Method used:   Telephoned to ...       [facility] [phone]* (retail)       [address]       [place], [place]  [address]       Ph: [phone] or [phone]       Fax: [phone]   RxID:   [id number] TRAMADOL HCL 50 MG  TABS (TRAMADOL HCL) 1 tab by mouth two times a day as needed pain  #120 x 0   Entered and Authorized by:   Ruthe Mannan MD   Signed by:   Ruthe Mannan MD on 06/25/2010   Method used:   Telephoned to ...       [facility] [phone]* (retail)       [address]       [place], [place]  [address]       Ph: [phone] or [phone]       Fax: [phone]   RxID:   [id number]

## 2010-11-11 NOTE — Progress Notes (Signed)
Summary: lunesta   Phone Note Refill Request Message from:  Fax from Pharmacy on October 20, 2010 10:34 AM  Refills Requested: Medication #1:  LUNESTA 3 MG TABS one by mouth at bedtime as needed.   Last Refilled: 09/20/2010 [facility] [phone]  Initial call taken by: Melody Comas,  October 20, 2010 10:36 AM  Follow-up for Phone Call        Rx called to pharmacy Follow-up by: Linde Gillis CMA Duncan Dull),  October 20, 2010 10:38 AM    Prescriptions: LUNESTA 3 MG TABS (ESZOPICLONE) one by mouth at bedtime as needed  #30 x 0   Entered and Authorized by:   Ruthe Mannan MD   Signed by:   Ruthe Mannan MD on 10/20/2010   Method used:   Telephoned to ...       [facility] [phone]* (retail)       [address]       [place], [place]  [address]       Ph: [phone] or [phone]       Fax: [phone]   RxID:   [id number]
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
## 2010-11-15 ENCOUNTER — Telehealth: Payer: Self-pay | Admitting: Family Medicine

## 2010-11-23 ENCOUNTER — Telehealth: Payer: Self-pay | Admitting: Family Medicine

## 2010-11-25 NOTE — Progress Notes (Signed)
Summary: Metformin  Phone Note Refill Request Message from:  Fax from Pharmacy on November 15, 2010 12:14 PM  Refills Requested: Medication #1:  METFORMIN HCL 500 MG TABS Take 1 tab by mouth daily Our meds list says once daily.  RF request says two times a day.  Please clarify.  [facility] [place]   Method Requested: Electronic Initial call taken by: Delilah Shan CMA Duncan Dull),  November 15, 2010 12:14 PM  Follow-up for Phone Call        please call pt to clarify. Ruthe Mannan MD  November 15, 2010 12:26 PM  Spoke with wife Myriam Jacobson and she stated the he takes it once daily.  Will send Rx to pharmacy. Follow-up by: Linde Gillis CMA Duncan Dull),  November 15, 2010 12:39 PM    Prescriptions: METFORMIN HCL 500 MG TABS (METFORMIN HCL) Take 1 tab by mouth daily  #30 x 6   Entered by:   Linde Gillis CMA (AAMA)   Authorized by:   Ruthe Mannan MD   Signed by:   Linde Gillis CMA (AAMA) on 11/15/2010   Method used:   Electronically to        [facility] [phone]* (retail)       [address]       [place], [place]  [address]       Ph: [phone] or [phone]       Fax: [phone]   RxID:   [id number]   Prior Medications: FLOMAX 0.4 MG CAPS (TAMSULOSIN HCL) Take 2 tablet by mouth at at bedtime ALEVE 220 MG TABS (NAPROXEN SODIUM) as needed for arthritis METFORMIN HCL 500 MG TABS (METFORMIN HCL) Take 1 tab by mouth daily VICODIN 5-500 MG TABS (HYDROCODONE-ACETAMINOPHEN) take one tablet by mouth every 4-6 hours as needed for pain TRAMADOL HCL 50 MG  TABS (TRAMADOL HCL) 1 tab by mouth every 6 hours as needed for pain. LUNESTA 3 MG TABS (ESZOPICLONE) one by mouth at bedtime as needed Current Allergies: No known allergies

## 2010-12-01 NOTE — Progress Notes (Signed)
Summary: refill request for lunesta  Phone Note Refill Request Message from:  Fax from Pharmacy  Refills Requested: Medication #1:  LUNESTA 3 MG TABS one by mouth at bedtime as needed.   Last Refilled: 10/20/2010 [facility], [phone].  Initial call taken by: Lowella Petties CMA, AAMA,  November 23, 2010 9:35 AM  Follow-up for Phone Call        Rx called to pharmacy Follow-up by: Linde Gillis CMA Duncan Dull),  November 23, 2010 9:48 AM    Prescriptions: LUNESTA 3 MG TABS (ESZOPICLONE) one by mouth at bedtime as needed  #30 x 0   Entered and Authorized by:   Ruthe Mannan MD   Signed by:   Ruthe Mannan MD on 11/23/2010   Method used:   Telephoned to ...       [facility] [phone]* (retail)       [address]       [place], [place]  [address]       Ph: [phone] or [phone]       Fax: [phone]   RxID:   [id number]

## 2010-12-02 ENCOUNTER — Encounter: Payer: Self-pay | Admitting: Family Medicine

## 2010-12-02 ENCOUNTER — Ambulatory Visit (INDEPENDENT_AMBULATORY_CARE_PROVIDER_SITE_OTHER): Payer: Medicare Other | Admitting: Family Medicine

## 2010-12-02 DIAGNOSIS — Q666 Other congenital valgus deformities of feet: Secondary | ICD-10-CM

## 2010-12-02 DIAGNOSIS — G609 Hereditary and idiopathic neuropathy, unspecified: Secondary | ICD-10-CM

## 2010-12-02 DIAGNOSIS — M2140 Flat foot [pes planus] (acquired), unspecified foot: Secondary | ICD-10-CM

## 2010-12-02 DIAGNOSIS — R7309 Other abnormal glucose: Secondary | ICD-10-CM

## 2010-12-02 DIAGNOSIS — M76829 Posterior tibial tendinitis, unspecified leg: Secondary | ICD-10-CM

## 2010-12-02 DIAGNOSIS — M7750 Other enthesopathy of unspecified foot: Secondary | ICD-10-CM

## 2010-12-02 DIAGNOSIS — G5760 Lesion of plantar nerve, unspecified lower limb: Secondary | ICD-10-CM

## 2010-12-02 DIAGNOSIS — M2020 Hallux rigidus, unspecified foot: Secondary | ICD-10-CM

## 2010-12-07 NOTE — Assessment & Plan Note (Addendum)
Summary: 45 min f/u JRT   Vital Signs:  Patient profile:   70 year old male Height:      72 inches Weight:      239.25 pounds BMI:     32.57 Temp:     98.6 degrees F oral Pulse rate:   80 / minute Pulse rhythm:   regular BP sitting:   130 / 70  (left arm) Cuff size:   large  Vitals Entered By: Melody Comas (December 02, 2010 2:06 PM) CC: folow up   History of Present Illness:  f/u foot pain:  Longstanding foot pain with severe midfoot, forefoot, hindfoot breakdown and associated neuropathic changes, pure peripheral neuropathy vs. diabetic neuropathy.  neuropathic sensations pain comes and goes, good days and not, maybe a little bit better.  pain comes pretty rapidly.   some of the longer standing foot issues, swelling, forefoot issues are improved compared to issues, but they are still there. (medial posts and MT pads placed last time on OTC orthotic)  Chief complaint Right foot and leg swelling   10/14/2010 OV: Dr. Dayton Martes has asked that I evalute the patient for foot and leg pain, which is happening more on the right compared to left - but is longstanding.  Metatarsalgia and pain B forefeet. numbness around ball of feet and right is having swelling aching. Numbness mostly between the 4th and 5th MT heads, but having some elsewhere. This is greater, R > L.  supervisor -- everything, insurance related damage. Has been up on his feet on concrete for 50 years, up and down ladders, etc.   No specific trauma, but pain in the medial foot and medial aspect of lower leg with some mild swelling.     Allergies (verified): No Known Drug Allergies  Past History:  Past medical, surgical, family and social histories (including risk factors) reviewed, and no changes noted (except as noted below).  Past Medical History: borderline DM OSA- uses CPAP at night BPH  Past Surgical History: Reviewed history from 08/28/2009 and no changes required. Cholecystectomy 1986  Family  History: Reviewed history from 08/28/2009 and no changes required. Mom died at 33- had DM, skin cancer Dad died at 57 from leukemia  Social History: Reviewed history from 08/28/2009 and no changes required. LIves in [place] with his wife.  Former smoker.  Does not drink.  Works as a Games developer.  Review of Systems       REVIEW OF SYSTEMS  GEN: No systemic complaints, no fevers, chills, sweats, or other acute illnesses MSK: Detailed in the HPI GI: tolerating PO intake without difficulty Neuro: as above Otherwise the pertinent positives of the ROS are noted above.    Physical Exam  General:  GEN: Well-developed,well-nourished,in no acute distress; alert,appropriate and cooperative throughout examination HEENT: Normocephalic and atraumatic without obvious abnormalities. No apparent alopecia or balding. Ears, externally no deformities PULM: Breathing comfortably in no respiratory distress EXT: No clubbing, cyanosis, or edema PSYCH: Normally interactive. Cooperative during the interview. Pleasant. Friendly and conversant. Not anxious or depressed appearing. Normal, full affect.  Msk:  walks with PRONATION, SEVERE, OUTWARD TURNED SOME ON THE RIGHT Echymosis: no Edema: no ROM: full LE B MT pain: YES, WITH SQUEEZE, MORE BETWEEN 3-5 ON R - less compared to prior exam today Callus pattern: 2-3 MT HEADS Lateral Mall: NT Medial Mall: NT Talus: NT Navicular: NT Cuboid: NT Calcaneous: NT Metatarsals: NT 5th MT: NT Phalanges: NT Achilles: NT Plantar Fascia: NT Fat Pad: NT Peroneals: NT  Post Tib: TTP, minimally Great Toe: R TOE WITH RELATIVE DECREASED MOTION Ant Drawer: neg ATFL: NT CFL: NT Deltoid: NT Long arch: SEVERE BREAKDOWN Transverse arch: COMPLETE BREAKDOWN, DROPPED ALL MT HEADS Hindfoot breakdown: L > R HINDFOOT VALGUS   Impression & Recommendations:  Problem # 1:  PERIPHERAL NEUROPATHY (ICD-356.9)  Patient was fitted for a standard, cushioned,  semi-rigid PLASTAZOTE orthotic.  The orthotic was heated and the patient stood on the orthotic blank positioned on the orthotic stand. The patient was positioned in subtalar neutral position and 10 degrees of ankle dorsiflexion in a weight bearing stance. After molding, a stable [employer] EVA base was applied to the orthotic blank.   The blank was ground to a stable position for weight bearing. size: base: F4 posting: bilateral medial post, improved foot turn out and alignment at heel additional orthotic padding: none   Ideal candidate for plastazote orthotic, the standard materal used from DM neuropathy and peripheral neuropathy. Severe foot breakdown. I tried to be frank that if we can achieve any improvement in symptoms, then it is a win. 100% relief of symptoms is not realistic, but 25-75% is possible.  In long term mangement, I think neuroma contributes, but I think a considerable component is neuropathy. Neurontin or Lyrica may help with symptoms.  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 Take 1 tab by mouth daily  Orders: [employer], each unit ([id number])  Complete Medication List: 1)  Flomax 0.4 Mg Caps (Tamsulosin hcl) .... Take 2 tablet by mouth at at bedtime 2)  Aleve 220 Mg Tabs (Naproxen  sodium) .... As needed for arthritis 3)  Metformin Hcl 500 Mg Tabs (Metformin hcl) .... Take 1 tab by mouth daily 4)  Vicodin 5-500 Mg Tabs (Hydrocodone-acetaminophen) .... Take one tablet by mouth every 4-6 hours as needed for pain 5)  Tramadol Hcl 50 Mg Tabs (Tramadol hcl) .Marland Kitchen.. 1 tab by mouth every 6 hours as needed for pain. 6)  Lunesta 3 Mg Tabs (Eszopiclone) .... One by mouth at bedtime as needed   Orders Added: 1)  Est. Patient Level IV [88416] 2)  Orthotic Materials, each unit [L3002]    Current Allergies (reviewed today): No known allergies

## 2010-12-10 ENCOUNTER — Ambulatory Visit: Payer: Medicare Other | Admitting: Family Medicine

## 2010-12-13 ENCOUNTER — Ambulatory Visit: Payer: Medicare Other | Admitting: Family Medicine

## 2010-12-16 ENCOUNTER — Ambulatory Visit: Payer: Medicare Other | Admitting: Family Medicine

## 2010-12-23 ENCOUNTER — Telehealth: Payer: Self-pay | Admitting: Family Medicine

## 2010-12-26 LAB — GLUCOSE, CAPILLARY
Glucose-Capillary: 129 mg/dL — ABNORMAL HIGH (ref 70–99)
Glucose-Capillary: 134 mg/dL — ABNORMAL HIGH (ref 70–99)

## 2010-12-27 LAB — URINALYSIS, ROUTINE W REFLEX MICROSCOPIC
Glucose, UA: NEGATIVE mg/dL
Ketones, ur: NEGATIVE mg/dL
pH: 6 (ref 5.0–8.0)

## 2010-12-27 LAB — DIFFERENTIAL
Eosinophils Relative: 1 % (ref 0–5)
Lymphocytes Relative: 12 % (ref 12–46)
Lymphs Abs: 1.1 10*3/uL (ref 0.7–4.0)

## 2010-12-27 LAB — GLUCOSE, CAPILLARY
Glucose-Capillary: 108 mg/dL — ABNORMAL HIGH (ref 70–99)
Glucose-Capillary: 117 mg/dL — ABNORMAL HIGH (ref 70–99)
Glucose-Capillary: 136 mg/dL — ABNORMAL HIGH (ref 70–99)
Glucose-Capillary: 170 mg/dL — ABNORMAL HIGH (ref 70–99)

## 2010-12-27 LAB — BASIC METABOLIC PANEL
CO2: 25 mEq/L (ref 19–32)
Chloride: 103 mEq/L (ref 96–112)
GFR calc Af Amer: >60 mL/min (ref >60)
GFR calc Af Amer: >60 mL/min (ref >60)
GFR calc non Af Amer: >60 mL/min (ref >60)
Potassium: 3.8 mEq/L (ref 3.5–5.1)
Sodium: 136 mEq/L (ref 135–145)
Sodium: 138 mEq/L (ref 135–145)

## 2010-12-27 LAB — CBC
HCT: 42.9 % (ref 39.0–52.0)
Hemoglobin: 14.8 g/dL (ref 13.0–17.0)
Platelets: 219 10*3/uL (ref 150–400)
WBC: 9.1 10*3/uL (ref 4.0–10.5)

## 2010-12-27 LAB — URINE MICROSCOPIC-ADD ON

## 2010-12-27 LAB — URINE CULTURE: Colony Count: NO GROWTH

## 2010-12-28 NOTE — Progress Notes (Signed)
Summary: lunesta   Phone Note Refill Request Message from:  Fax from Pharmacy on December 23, 2010 4:06 PM  Refills Requested: Medication #1:  LUNESTA 3 MG TABS one by mouth at bedtime as needed.   Last Refilled: 11/23/2010 [facility]. [phone]  Initial call taken by: Melody Comas,  December 23, 2010 4:12 PM  Follow-up for Phone Call        please call in.  thanks. Crawford Givens MD  December 23, 2010 4:42 PM   Medication phoned to pharmacy.  Follow-up by: Delilah Shan CMA Jazmene Racz Dull),  December 23, 2010 4:42 PM    Prescriptions: LUNESTA 3 MG TABS (ESZOPICLONE) one by mouth at bedtime as needed  #30 x 0   Entered and Authorized by:   Crawford Givens MD   Signed by:   Crawford Givens MD on 12/23/2010   Method used:   Telephoned to ...       [phone][facility] (retail)       [address]       [place], [place]  [address]       Ph: [phone] or [phone]       Fax: [phone]   RxID:   [id number]

## 2011-01-13 ENCOUNTER — Other Ambulatory Visit: Payer: Self-pay

## 2011-01-13 MED ORDER — TRAMADOL HCL 50 MG PO TABS: 50.0000 mg | ORAL_TABLET | Freq: Four times a day (QID) | ORAL | Status: DC | PRN

## 2011-01-13 NOTE — Telephone Encounter (Signed)
Refilled. plz notify patient.  

## 2011-01-13 NOTE — Telephone Encounter (Signed)
Rx called in as directed.   

## 2011-01-31 ENCOUNTER — Other Ambulatory Visit: Payer: Self-pay | Admitting: *Deleted

## 2011-01-31 MED ORDER — ESZOPICLONE 3 MG PO TABS: 3.0000 mg | ORAL_TABLET | Freq: Every day | ORAL | Status: DC

## 2011-01-31 NOTE — Telephone Encounter (Signed)
Rx called to pharmacy

## 2011-02-22 ENCOUNTER — Encounter: Payer: Self-pay | Admitting: Family Medicine

## 2011-02-23 ENCOUNTER — Ambulatory Visit: Payer: Medicare Other | Admitting: Family Medicine

## 2011-02-25 ENCOUNTER — Ambulatory Visit: Payer: Medicare Other | Admitting: Family Medicine

## 2011-03-02 ENCOUNTER — Encounter: Payer: Self-pay | Admitting: Family Medicine

## 2011-03-02 ENCOUNTER — Ambulatory Visit (INDEPENDENT_AMBULATORY_CARE_PROVIDER_SITE_OTHER): Payer: Medicare Other | Admitting: Family Medicine

## 2011-03-02 VITALS — BP 140/80 | HR 60 | Temp 98.0°F | Wt 238.4 lb

## 2011-03-02 DIAGNOSIS — J018 Other acute sinusitis: Secondary | ICD-10-CM

## 2011-03-02 MED ORDER — AZITHROMYCIN 250 MG PO TABS: ORAL_TABLET | ORAL | Status: AC

## 2011-03-02 MED ORDER — TRAMADOL HCL 50 MG PO TABS: 50.0000 mg | ORAL_TABLET | Freq: Four times a day (QID) | ORAL | Status: DC | PRN

## 2011-03-02 NOTE — Progress Notes (Signed)
Subjective:     Nicholas [name] is a 70 y.o. male who presents for evaluation of sinus pain. Symptoms include: congestion, facial pain, fevers, mouth breathing and nasal congestion. Onset of symptoms was 4 weeks ago. Symptoms have been gradually worsening since that time. Past history is significant for no history of pneumonia or bronchitis. Patient is a non-smoker.  The PMH, PSH, Social History, Family History, Medications, and allergies have been reviewed in [facility], and have been updated if relevant.   Review of Systems See HPI  Objective:  BP 140/80  Pulse 60  Temp(Src) 98 F (36.7 C) (Oral)  Wt 238 lb 6.4 oz (108.138 kg) Gen:  Alert, pleasant, NAD HEENT:  Boggy turbinates, no obstruction.Sinuses TTP throughout. Resp:  CTA bilaterally CVS:  RRR Ext:  No edema    Assessment:    Acute bacterial sinusitis.    Plan:    Nasal saline sprays. Azithromycin per medication orders.

## 2011-04-07 ENCOUNTER — Other Ambulatory Visit: Payer: Self-pay | Admitting: *Deleted

## 2011-04-07 MED ORDER — ESZOPICLONE 3 MG PO TABS: 3.0000 mg | ORAL_TABLET | Freq: Every day | ORAL | Status: DC

## 2011-04-08 NOTE — Telephone Encounter (Signed)
[facility] 

## 2011-05-10 ENCOUNTER — Other Ambulatory Visit: Payer: Self-pay | Admitting: *Deleted

## 2011-05-10 MED ORDER — ESZOPICLONE 3 MG PO TABS: 3.0000 mg | ORAL_TABLET | Freq: Every day | ORAL | Status: DC

## 2011-05-10 NOTE — Telephone Encounter (Signed)
Rx called to pharmacy

## 2011-06-16 ENCOUNTER — Other Ambulatory Visit: Payer: Self-pay

## 2011-06-16 MED ORDER — ESZOPICLONE 3 MG PO TABS: 3.0000 mg | ORAL_TABLET | Freq: Every day | ORAL | Status: DC

## 2011-06-16 NOTE — Telephone Encounter (Signed)
Dr Dayton Martes is out of office and [facility] faxed refill request for Lunesta 3 mg. Last filled 05/10/11.Please advise. Fax request is in Dr Karle Starch in box.

## 2011-06-16 NOTE — Telephone Encounter (Signed)
Okay #30 x 0 

## 2011-06-16 NOTE — Telephone Encounter (Signed)
[facility]

## 2011-06-20 ENCOUNTER — Other Ambulatory Visit: Payer: Self-pay

## 2011-06-20 MED ORDER — METFORMIN HCL ER (OSM) 500 MG PO TB24: 500.0000 mg | ORAL_TABLET | Freq: Every day | ORAL | Status: DC

## 2011-06-20 NOTE — Telephone Encounter (Signed)
[facility] faxed refill request Metformin 500mg  #30 x 1 with note pt needs to call for appt.

## 2011-07-25 ENCOUNTER — Other Ambulatory Visit: Payer: Self-pay | Admitting: *Deleted

## 2011-07-25 MED ORDER — ESZOPICLONE 3 MG PO TABS: 3.0000 mg | ORAL_TABLET | Freq: Every day | ORAL | Status: DC

## 2011-07-25 NOTE — Telephone Encounter (Signed)
rx called into pharmacy

## 2011-07-25 NOTE — Telephone Encounter (Signed)
Okay #30 x 0 

## 2011-08-08 ENCOUNTER — Telehealth: Payer: Self-pay | Admitting: *Deleted

## 2011-08-08 NOTE — Telephone Encounter (Signed)
Form for diabetic supplies is on your desk, I checked with the patient and he does want these supplies.

## 2011-08-08 NOTE — Telephone Encounter (Signed)
In my box

## 2011-08-08 NOTE — Telephone Encounter (Signed)
Form faxed. Original placed up front for scanning.

## 2011-08-15 ENCOUNTER — Other Ambulatory Visit: Payer: Self-pay | Admitting: *Deleted

## 2011-08-15 MED ORDER — METFORMIN HCL ER (OSM) 500 MG PO TB24: 500.0000 mg | ORAL_TABLET | Freq: Every day | ORAL | Status: DC

## 2011-08-25 ENCOUNTER — Encounter: Payer: Self-pay | Admitting: Family Medicine

## 2011-08-25 ENCOUNTER — Ambulatory Visit (INDEPENDENT_AMBULATORY_CARE_PROVIDER_SITE_OTHER): Payer: Medicare Other | Admitting: Family Medicine

## 2011-08-25 VITALS — BP 130/78 | HR 80 | Temp 97.8°F | Wt 241.5 lb

## 2011-08-25 DIAGNOSIS — G8921 Chronic pain due to trauma: Secondary | ICD-10-CM

## 2011-08-25 DIAGNOSIS — E1140 Type 2 diabetes mellitus with diabetic neuropathy, unspecified: Secondary | ICD-10-CM | POA: Insufficient documentation

## 2011-08-25 DIAGNOSIS — S83419A Sprain of medial collateral ligament of unspecified knee, initial encounter: Secondary | ICD-10-CM | POA: Insufficient documentation

## 2011-08-25 DIAGNOSIS — M25569 Pain in unspecified knee: Secondary | ICD-10-CM | POA: Insufficient documentation

## 2011-08-25 DIAGNOSIS — E1149 Type 2 diabetes mellitus with other diabetic neurological complication: Secondary | ICD-10-CM

## 2011-08-25 DIAGNOSIS — E1142 Type 2 diabetes mellitus with diabetic polyneuropathy: Secondary | ICD-10-CM

## 2011-08-25 MED ORDER — NAPROXEN SODIUM 220 MG PO TABS: 220.0000 mg | ORAL_TABLET | Freq: Two times a day (BID) | ORAL | Status: DC

## 2011-08-25 MED ORDER — GABAPENTIN 300 MG PO CAPS: 300.0000 mg | ORAL_CAPSULE | Freq: Three times a day (TID) | ORAL | Status: DC

## 2011-08-25 NOTE — Patient Instructions (Signed)
Start out taking the Gabapentin 300 mg nightly and we can increase to three times a day if you tolerate it. You can apply ice to your knee for 20 to 30 minutes every 3 to 4 hours. Elevate your knee. Take Anaprox as directed.

## 2011-08-25 NOTE — Progress Notes (Signed)
70 yo here for Right knee pain.  Larey Seat over trash can two days ago, twisted right knee.  There was some swelling, that has improved. Most of his pain is in the medial knee but does not hurt to walk.  Bears weight without difficulty.  Not taking anything for it.  He is interested in taking something for diabetic neuropathy in his feet. Just ordered diabetic shoes.  REVIEW OF SYSTEMS  GEN: No systemic complaints, no fevers, chills, sweats, or other acute illnesses  MSK: Detailed in the HPI  GI: tolerating PO intake without difficulty  Neuro: No numbness, parasthesias, or tingling associated.  Otherwise the pertinent positives of the ROS are noted above.   Physical Exam  General: GEN: Well-developed,well-nourished,in no acute distress; alert,appropriate and cooperative throughout examination  HEENT: Normocephalic and atraumatic without obvious abnormalities. No apparent alopecia or balding. Ears, externally no deformities  PULM: Breathing comfortably in no respiratory distress  EXT: No clubbing, cyanosis, or edema  PSYCH: Normally interactive. Cooperative during the interview. Pleasant. Friendly and conversant. Not anxious or depressed appearing. Normal, full affect.  Right Knee- mild effusion, no pain with weight bearing, non TTP over joint lines,  Does have some pain with active flexion, otherwise unremarkable.   Impression & Recommendations:    1. Diabetic neuropathy   Deteriorated. Will add Gabapentin- see pt instructions for details. 2. Knee pain    New but improving- likely very minimal medial sprain but improving with every day. Handout given. Will use Anaprox and ice. The patient indicates understanding of these issues and agrees with the plan.

## 2011-08-29 ENCOUNTER — Other Ambulatory Visit: Payer: Self-pay | Admitting: *Deleted

## 2011-08-29 MED ORDER — ESZOPICLONE 3 MG PO TABS: 3.0000 mg | ORAL_TABLET | Freq: Every day | ORAL | Status: DC

## 2011-08-29 NOTE — Telephone Encounter (Signed)
Last refilled 07/25/2011

## 2011-08-29 NOTE — Telephone Encounter (Signed)
[facility] 

## 2011-09-28 ENCOUNTER — Other Ambulatory Visit: Payer: Self-pay | Admitting: Family Medicine

## 2011-09-28 NOTE — Telephone Encounter (Signed)
Rx sent electronically by Dr. Aron. 

## 2011-09-30 ENCOUNTER — Other Ambulatory Visit: Payer: Self-pay | Admitting: *Deleted

## 2011-09-30 MED ORDER — ESZOPICLONE 3 MG PO TABS: 3.0000 mg | ORAL_TABLET | Freq: Every day | ORAL | Status: DC

## 2011-09-30 NOTE — Telephone Encounter (Signed)
Form in your IN box 

## 2011-09-30 NOTE — Telephone Encounter (Signed)
[facility] 

## 2011-10-11 DIAGNOSIS — K439 Ventral hernia without obstruction or gangrene: Secondary | ICD-10-CM

## 2011-10-11 HISTORY — DX: Ventral hernia without obstruction or gangrene: K43.9

## 2011-10-14 DIAGNOSIS — J019 Acute sinusitis, unspecified: Secondary | ICD-10-CM | POA: Diagnosis not present

## 2011-10-14 DIAGNOSIS — J069 Acute upper respiratory infection, unspecified: Secondary | ICD-10-CM | POA: Diagnosis not present

## 2011-11-11 ENCOUNTER — Other Ambulatory Visit: Payer: Self-pay | Admitting: *Deleted

## 2011-11-11 MED ORDER — ESZOPICLONE 3 MG PO TABS: 3.0000 mg | ORAL_TABLET | Freq: Every day | ORAL | Status: DC

## 2011-11-11 NOTE — Telephone Encounter (Signed)
Rx called to pharmacy

## 2011-11-18 ENCOUNTER — Encounter: Payer: Self-pay | Admitting: Family Medicine

## 2011-11-18 ENCOUNTER — Ambulatory Visit (INDEPENDENT_AMBULATORY_CARE_PROVIDER_SITE_OTHER): Payer: Medicare Other | Admitting: Family Medicine

## 2011-11-18 VITALS — BP 120/64 | HR 76 | Temp 98.0°F | Wt 247.8 lb

## 2011-11-18 DIAGNOSIS — R600 Localized edema: Secondary | ICD-10-CM

## 2011-11-18 DIAGNOSIS — R609 Edema, unspecified: Secondary | ICD-10-CM | POA: Diagnosis not present

## 2011-11-18 DIAGNOSIS — R7309 Other abnormal glucose: Secondary | ICD-10-CM | POA: Diagnosis not present

## 2011-11-18 NOTE — Patient Instructions (Signed)
Good to see you. Try to keep your legs elevated as tolerated. If all your labs come back normal, we will start a low dose water pill (diuretic).

## 2011-11-18 NOTE — Progress Notes (Signed)
71 yo here for Right LE edema.  Has been ongoing for years, multiple injuries to right knee and leg.  Does not like to elevate his feet because they fall asleep. No SOB or chest pain.  Much worse after he has been on his feet all day, right leg gets a little swollen as well.  REVIEW OF SYSTEMS  GEN: No systemic complaints, no fevers, chills, sweats, or other acute illnesses  MSK: Detailed in the HPI  GI: tolerating PO intake without difficulty  Neuro: No numbness, parasthesias, or tingling associated.  Otherwise the pertinent positives of the ROS are noted above.   Physical Exam  BP 120/64  Pulse 76  Temp(Src) 98 F (36.7 C) (Oral)  Wt 247 lb 12 oz (112.379 kg)  General: GEN: Well-developed,well-nourished,in no acute distress; alert,appropriate and cooperative throughout examination  HEENT: Normocephalic and atraumatic without obvious abnormalities. No apparent alopecia or balding. Ears, externally no deformities  PULM: Breathing comfortably in no respiratory distress  EXT: No clubbing, cyanosis, or edema  PSYCH: Normally interactive. Cooperative during the interview. Pleasant. Friendly and conversant. Not anxious or depressed appearing. Normal, full affect.  Right LE- 1+ non pitting edema, trace edema on left.    Impression & Recommendations:    1. Lower extremity edema  Deteriorated. Likely dependent edema worsened by traumatic injury to leg. Will check labs to rule out other possible contributing factors.  If all normal, consider low dose as needed diuretic. The patient indicates understanding of these issues and agrees with the plan.  Basic Metabolic Panel (BMET), Hepatic function panel, TSH  2. DIABETES MELLITUS, BORDERLINE  HgB A1c

## 2011-11-19 LAB — HEMOGLOBIN A1C
Hgb A1c MFr Bld: 5.8 % — ABNORMAL HIGH (ref <5.7)
Mean Plasma Glucose: 120 mg/dL — ABNORMAL HIGH (ref <117)

## 2011-11-19 LAB — BASIC METABOLIC PANEL
Calcium: 9.3 mg/dL (ref 8.4–10.5)
Glucose, Bld: 119 mg/dL — ABNORMAL HIGH (ref 70–99)
Sodium: 137 mEq/L (ref 135–145)

## 2011-11-19 LAB — HEPATIC FUNCTION PANEL
ALT: 17 U/L (ref 0–53)
Bilirubin, Direct: 0.1 mg/dL (ref 0.0–0.3)
Total Bilirubin: 0.4 mg/dL (ref 0.3–1.2)

## 2011-11-19 LAB — TSH: TSH: 1.261 u[IU]/mL (ref 0.350–4.500)

## 2011-11-21 ENCOUNTER — Other Ambulatory Visit: Payer: Self-pay | Admitting: *Deleted

## 2011-11-21 MED ORDER — HYDROCHLOROTHIAZIDE 12.5 MG PO CAPS: 12.5000 mg | ORAL_CAPSULE | Freq: Every day | ORAL | Status: DC

## 2011-12-07 ENCOUNTER — Other Ambulatory Visit: Payer: Self-pay | Admitting: Family Medicine

## 2011-12-18 DIAGNOSIS — J019 Acute sinusitis, unspecified: Secondary | ICD-10-CM | POA: Diagnosis not present

## 2011-12-18 DIAGNOSIS — J209 Acute bronchitis, unspecified: Secondary | ICD-10-CM | POA: Diagnosis not present

## 2011-12-23 ENCOUNTER — Inpatient Hospital Stay (HOSPITAL_COMMUNITY)
Admission: EM | Admit: 2011-12-23 | Discharge: 2011-12-31 | DRG: 331 | Disposition: A | Discharge status: Home Health | Payer: Medicare Other | Attending: General Surgery | Admitting: General Surgery

## 2011-12-23 ENCOUNTER — Emergency Department (HOSPITAL_COMMUNITY): Payer: Medicare Other

## 2011-12-23 ENCOUNTER — Encounter (HOSPITAL_COMMUNITY): Payer: Self-pay | Admitting: Anesthesiology

## 2011-12-23 ENCOUNTER — Other Ambulatory Visit: Payer: Self-pay

## 2011-12-23 ENCOUNTER — Encounter (HOSPITAL_COMMUNITY): Admission: EM | Disposition: A | Discharge status: Home Health | Payer: Self-pay | Source: Home / Self Care

## 2011-12-23 ENCOUNTER — Encounter (HOSPITAL_COMMUNITY): Payer: Self-pay | Admitting: Emergency Medicine

## 2011-12-23 ENCOUNTER — Emergency Department (HOSPITAL_COMMUNITY): Payer: Medicare Other | Admitting: Anesthesiology

## 2011-12-23 DIAGNOSIS — K668 Other specified disorders of peritoneum: Secondary | ICD-10-CM | POA: Diagnosis present

## 2011-12-23 DIAGNOSIS — G4733 Obstructive sleep apnea (adult) (pediatric): Secondary | ICD-10-CM | POA: Diagnosis present

## 2011-12-23 DIAGNOSIS — N401 Enlarged prostate with lower urinary tract symptoms: Secondary | ICD-10-CM | POA: Diagnosis present

## 2011-12-23 DIAGNOSIS — F17200 Nicotine dependence, unspecified, uncomplicated: Secondary | ICD-10-CM | POA: Diagnosis present

## 2011-12-23 DIAGNOSIS — E119 Type 2 diabetes mellitus without complications: Secondary | ICD-10-CM | POA: Diagnosis not present

## 2011-12-23 DIAGNOSIS — G894 Chronic pain syndrome: Secondary | ICD-10-CM | POA: Diagnosis present

## 2011-12-23 DIAGNOSIS — G5790 Unspecified mononeuropathy of unspecified lower limb: Secondary | ICD-10-CM | POA: Diagnosis present

## 2011-12-23 DIAGNOSIS — N138 Other obstructive and reflux uropathy: Secondary | ICD-10-CM | POA: Diagnosis present

## 2011-12-23 DIAGNOSIS — E669 Obesity, unspecified: Secondary | ICD-10-CM | POA: Diagnosis not present

## 2011-12-23 DIAGNOSIS — R339 Retention of urine, unspecified: Secondary | ICD-10-CM | POA: Diagnosis not present

## 2011-12-23 DIAGNOSIS — K5732 Diverticulitis of large intestine without perforation or abscess without bleeding: Principal | ICD-10-CM | POA: Diagnosis present

## 2011-12-23 DIAGNOSIS — R109 Unspecified abdominal pain: Secondary | ICD-10-CM | POA: Diagnosis not present

## 2011-12-23 DIAGNOSIS — K631 Perforation of intestine (nontraumatic): Secondary | ICD-10-CM | POA: Diagnosis not present

## 2011-12-23 DIAGNOSIS — Z01811 Encounter for preprocedural respiratory examination: Secondary | ICD-10-CM | POA: Diagnosis not present

## 2011-12-23 DIAGNOSIS — K5720 Diverticulitis of large intestine with perforation and abscess without bleeding: Secondary | ICD-10-CM

## 2011-12-23 HISTORY — PX: LAPAROTOMY: SHX154

## 2011-12-23 HISTORY — PX: APPENDECTOMY: SHX54

## 2011-12-23 HISTORY — PX: COLOSTOMY REVISION: SHX5232

## 2011-12-23 HISTORY — DX: Polyneuropathy, unspecified: G62.9

## 2011-12-23 LAB — CBC
HCT: 41.4 % (ref 39.0–52.0)
MCV: 87.9 fL (ref 78.0–100.0)
Platelets: 165 10*3/uL (ref 150–400)
RBC: 4.71 MIL/uL (ref 4.22–5.81)
RDW: 13 % (ref 11.5–15.5)
WBC: 12.9 10*3/uL — ABNORMAL HIGH (ref 4.0–10.5)

## 2011-12-23 LAB — COMPREHENSIVE METABOLIC PANEL
ALT: 19 U/L (ref 0–53)
AST: 17 U/L (ref 0–37)
Alkaline Phosphatase: 57 U/L (ref 39–117)
CO2: 28 mEq/L (ref 19–32)
Chloride: 98 mEq/L (ref 96–112)
GFR calc Af Amer: >90 mL/min (ref >90)
GFR calc non Af Amer: 82 mL/min — ABNORMAL LOW (ref >90)
Glucose, Bld: 143 mg/dL — ABNORMAL HIGH (ref 70–99)
Sodium: 135 mEq/L (ref 135–145)
Total Bilirubin: 1.1 mg/dL (ref 0.3–1.2)

## 2011-12-23 LAB — GLUCOSE, CAPILLARY: Glucose-Capillary: 157 mg/dL — ABNORMAL HIGH (ref 70–99)

## 2011-12-23 LAB — DIFFERENTIAL
Basophils Absolute: 0 10*3/uL (ref 0.0–0.1)
Lymphocytes Relative: 5 % — ABNORMAL LOW (ref 12–46)
Lymphs Abs: 0.6 10*3/uL — ABNORMAL LOW (ref 0.7–4.0)
Neutro Abs: 11.4 10*3/uL — ABNORMAL HIGH (ref 1.7–7.7)

## 2011-12-23 LAB — URINE MICROSCOPIC-ADD ON

## 2011-12-23 LAB — URINALYSIS, ROUTINE W REFLEX MICROSCOPIC
Bilirubin Urine: NEGATIVE
Glucose, UA: NEGATIVE mg/dL
Ketones, ur: NEGATIVE mg/dL
Protein, ur: NEGATIVE mg/dL

## 2011-12-23 LAB — APTT: aPTT: 38 seconds — ABNORMAL HIGH (ref 24–37)

## 2011-12-23 SURGERY — Surgical Case
Anesthesia: *Unknown

## 2011-12-23 SURGERY — LAPAROTOMY, EXPLORATORY
Anesthesia: General | Site: Abdomen | Wound class: Dirty or Infected

## 2011-12-23 MED ORDER — HEPARIN SODIUM (PORCINE) 5000 UNIT/ML IJ SOLN
5000.0000 [IU] | Freq: Three times a day (TID) | INTRAMUSCULAR | Status: DC
  Administered 2011-12-24 – 2011-12-30 (×20): 5000 [IU] via SUBCUTANEOUS
  Filled 2011-12-23 (×24): qty 1

## 2011-12-23 MED ORDER — SODIUM CHLORIDE 0.9 % IV SOLN
1.0000 g | INTRAVENOUS | Status: DC
  Administered 2011-12-23 – 2011-12-30 (×8): 1 g via INTRAVENOUS
  Filled 2011-12-23 (×10): qty 1

## 2011-12-23 MED ORDER — ONDANSETRON HCL 4 MG/2ML IJ SOLN
INTRAMUSCULAR | Status: DC | PRN
  Administered 2011-12-23: 4 mg via INTRAVENOUS

## 2011-12-23 MED ORDER — ONDANSETRON HCL 4 MG/2ML IJ SOLN
4.0000 mg | Freq: Once | INTRAMUSCULAR | Status: AC
  Administered 2011-12-23: 4 mg via INTRAVENOUS
  Filled 2011-12-23: qty 2

## 2011-12-23 MED ORDER — DIPHENHYDRAMINE HCL 12.5 MG/5ML PO ELIX
12.5000 mg | ORAL_SOLUTION | Freq: Four times a day (QID) | ORAL | Status: DC | PRN
  Filled 2011-12-23: qty 5

## 2011-12-23 MED ORDER — ROCURONIUM BROMIDE 100 MG/10ML IV SOLN
INTRAVENOUS | Status: DC | PRN
  Administered 2011-12-23 (×2): 10 mg via INTRAVENOUS
  Administered 2011-12-23: 40 mg via INTRAVENOUS

## 2011-12-23 MED ORDER — INSULIN ASPART 100 UNIT/ML ~~LOC~~ SOLN
0.0000 [IU] | SUBCUTANEOUS | Status: DC
  Administered 2011-12-23: 2 [IU] via SUBCUTANEOUS
  Administered 2011-12-23: 3 [IU] via SUBCUTANEOUS
  Administered 2011-12-24 – 2011-12-29 (×7): 2 [IU] via SUBCUTANEOUS

## 2011-12-23 MED ORDER — HYDROMORPHONE HCL PF 1 MG/ML IJ SOLN
INTRAMUSCULAR | Status: DC | PRN
  Administered 2011-12-23 (×2): 1 mg via INTRAVENOUS

## 2011-12-23 MED ORDER — PANTOPRAZOLE SODIUM 40 MG IV SOLR: 40.0000 mg | Freq: Two times a day (BID) | INTRAVENOUS | Status: DC

## 2011-12-23 MED ORDER — PANTOPRAZOLE SODIUM 40 MG IV SOLR
40.0000 mg | INTRAVENOUS | Status: DC
  Administered 2011-12-24 – 2011-12-29 (×6): 40 mg via INTRAVENOUS
  Filled 2011-12-23 (×8): qty 40

## 2011-12-23 MED ORDER — IOHEXOL 300 MG/ML  SOLN
100.0000 mL | Freq: Once | INTRAMUSCULAR | Status: AC | PRN
  Administered 2011-12-23: 100 mL via INTRAVENOUS

## 2011-12-23 MED ORDER — SODIUM CHLORIDE 0.9 % IV SOLN
Freq: Once | INTRAVENOUS | Status: AC
  Administered 2011-12-23: 11:00:00 via INTRAVENOUS

## 2011-12-23 MED ORDER — LIDOCAINE HCL (CARDIAC) 20 MG/ML IV SOLN
INTRAVENOUS | Status: DC | PRN
  Administered 2011-12-23: 50 mg via INTRAVENOUS

## 2011-12-23 MED ORDER — METRONIDAZOLE IN NACL 5-0.79 MG/ML-% IV SOLN
500.0000 mg | Freq: Once | INTRAVENOUS | Status: AC
  Administered 2011-12-23: 500 mg via INTRAVENOUS
  Filled 2011-12-23: qty 100

## 2011-12-23 MED ORDER — MORPHINE SULFATE (PF) 1 MG/ML IV SOLN
INTRAVENOUS | Status: DC
  Administered 2011-12-23: 18:00:00 via INTRAVENOUS
  Administered 2011-12-23: 3 mg via INTRAVENOUS
  Administered 2011-12-24: 22.2 mg via INTRAVENOUS
  Administered 2011-12-24: 03:00:00 via INTRAVENOUS
  Administered 2011-12-24: 5.87 mg via INTRAVENOUS
  Administered 2011-12-24: 12:00:00 via INTRAVENOUS
  Administered 2011-12-24: 19.5 mg via INTRAVENOUS
  Administered 2011-12-24: 09:00:00 via INTRAVENOUS
  Administered 2011-12-24: 12 mg via INTRAVENOUS
  Administered 2011-12-24: 22.5 mg via INTRAVENOUS
  Administered 2011-12-25: 13:00:00 via INTRAVENOUS
  Administered 2011-12-25: 3 mg via INTRAVENOUS
  Administered 2011-12-25: 18 mg via INTRAVENOUS
  Administered 2011-12-25: 7.15 mg via INTRAVENOUS
  Administered 2011-12-25: 08:00:00 via INTRAVENOUS
  Administered 2011-12-25: 7.5 mg via INTRAVENOUS
  Administered 2011-12-25: 12 mg via INTRAVENOUS
  Administered 2011-12-26: 22:00:00 via INTRAVENOUS
  Administered 2011-12-26: 6 mg via INTRAVENOUS
  Administered 2011-12-26: 12 mg via INTRAVENOUS
  Administered 2011-12-26: 11.5 mg via INTRAVENOUS
  Administered 2011-12-26: 6 mg via INTRAVENOUS
  Administered 2011-12-26: 7.5 mg via INTRAVENOUS
  Administered 2011-12-27: 3 mg via INTRAVENOUS
  Administered 2011-12-27: 9.77 mg via INTRAVENOUS
  Administered 2011-12-27: 14:00:00 via INTRAVENOUS
  Administered 2011-12-27: 1.5 mg via INTRAVENOUS
  Administered 2011-12-27: 12 mg via INTRAVENOUS
  Administered 2011-12-27: 4.5 mg via INTRAVENOUS
  Administered 2011-12-27: 6 mg via INTRAVENOUS
  Administered 2011-12-28: 12 mg via INTRAVENOUS
  Administered 2011-12-28: 7.5 mg via INTRAVENOUS
  Administered 2011-12-28: 4.5 mg via INTRAVENOUS
  Administered 2011-12-28: 13:00:00 via INTRAVENOUS
  Administered 2011-12-28: 12 mg via INTRAVENOUS
  Administered 2011-12-28: 7.1 mg via INTRAVENOUS
  Administered 2011-12-28 – 2011-12-29 (×2): 6 mg via INTRAVENOUS
  Administered 2011-12-29: 9 mg via INTRAVENOUS
  Administered 2011-12-29: 6 mg via INTRAVENOUS
  Administered 2011-12-29: 6.85 mg via INTRAVENOUS
  Administered 2011-12-29: 20.47 mg via INTRAVENOUS
  Administered 2011-12-29: 6 mg via INTRAVENOUS
  Administered 2011-12-29 – 2011-12-30 (×2): via INTRAVENOUS
  Filled 2011-12-23 (×14): qty 25

## 2011-12-23 MED ORDER — LACTATED RINGERS IV SOLN: INTRAVENOUS | Status: DC

## 2011-12-23 MED ORDER — ONDANSETRON HCL 4 MG/2ML IJ SOLN
4.0000 mg | INTRAMUSCULAR | Status: DC | PRN
Start: 2011-12-23 — End: 2011-12-31
  Administered 2011-12-23 – 2011-12-30 (×7): 4 mg via INTRAVENOUS
  Filled 2011-12-23 (×8): qty 2

## 2011-12-23 MED ORDER — NEOSTIGMINE METHYLSULFATE 1 MG/ML IJ SOLN
INTRAMUSCULAR | Status: DC | PRN
  Administered 2011-12-23: 4 mg via INTRAVENOUS

## 2011-12-23 MED ORDER — ONDANSETRON HCL 4 MG/2ML IJ SOLN: 4.0000 mg | Freq: Four times a day (QID) | INTRAMUSCULAR | Status: DC | PRN

## 2011-12-23 MED ORDER — CIPROFLOXACIN IN D5W 400 MG/200ML IV SOLN
400.0000 mg | Freq: Once | INTRAVENOUS | Status: AC
  Administered 2011-12-23: 400 mg via INTRAVENOUS
  Filled 2011-12-23: qty 200

## 2011-12-23 MED ORDER — SODIUM CHLORIDE 0.9 % IV SOLN
INTRAVENOUS | Status: DC | PRN
  Administered 2011-12-23: 15:00:00 via INTRAVENOUS

## 2011-12-23 MED ORDER — CIPROFLOXACIN IN D5W 400 MG/200ML IV SOLN
400.0000 mg | Freq: Two times a day (BID) | INTRAVENOUS | Status: DC
  Filled 2011-12-23: qty 200

## 2011-12-23 MED ORDER — KETOROLAC TROMETHAMINE 30 MG/ML IJ SOLN
30.0000 mg | Freq: Once | INTRAMUSCULAR | Status: AC
  Administered 2011-12-23: 30 mg via INTRAVENOUS
  Filled 2011-12-23: qty 1

## 2011-12-23 MED ORDER — HYDROMORPHONE HCL PF 1 MG/ML IJ SOLN
INTRAMUSCULAR | Status: AC
  Administered 2011-12-23: 0.5 mg via INTRAVENOUS
  Filled 2011-12-23: qty 1

## 2011-12-23 MED ORDER — MORPHINE SULFATE 2 MG/ML IJ SOLN: 1.0000 mg | INTRAMUSCULAR | Status: DC | PRN

## 2011-12-23 MED ORDER — PROPOFOL 10 MG/ML IV EMUL
INTRAVENOUS | Status: DC | PRN
  Administered 2011-12-23: 180 mg via INTRAVENOUS

## 2011-12-23 MED ORDER — SODIUM CHLORIDE 0.9 % IV SOLN: INTRAVENOUS | Status: DC

## 2011-12-23 MED ORDER — LACTATED RINGERS IV SOLN
INTRAVENOUS | Status: DC
  Administered 2011-12-23 – 2011-12-24 (×2): via INTRAVENOUS
  Administered 2011-12-24: 1000 mL via INTRAVENOUS
  Administered 2011-12-24: 21:00:00 via INTRAVENOUS
  Administered 2011-12-25: 1000 mL via INTRAVENOUS
  Administered 2011-12-25: 13:00:00 via INTRAVENOUS
  Administered 2011-12-25 – 2011-12-26 (×2): 1000 mL via INTRAVENOUS
  Administered 2011-12-26 – 2011-12-30 (×11): via INTRAVENOUS

## 2011-12-23 MED ORDER — SODIUM CHLORIDE 0.9 % IV SOLN
1.0000 g | INTRAVENOUS | Status: DC | PRN
  Administered 2011-12-23: 1 g via INTRAVENOUS

## 2011-12-23 MED ORDER — SODIUM CHLORIDE 0.9 % IV SOLN
INTRAVENOUS | Status: AC
  Filled 2011-12-23: qty 1

## 2011-12-23 MED ORDER — SODIUM CHLORIDE 0.9 % IJ SOLN: 9.0000 mL | INTRAMUSCULAR | Status: DC | PRN

## 2011-12-23 MED ORDER — DIPHENHYDRAMINE HCL 50 MG/ML IJ SOLN: 12.5000 mg | Freq: Four times a day (QID) | INTRAMUSCULAR | Status: DC | PRN

## 2011-12-23 MED ORDER — LACTATED RINGERS IV SOLN
INTRAVENOUS | Status: DC | PRN
  Administered 2011-12-23 (×3): via INTRAVENOUS

## 2011-12-23 MED ORDER — MORPHINE SULFATE (PF) 1 MG/ML IV SOLN
INTRAVENOUS | Status: AC
  Administered 2011-12-24: 6 mg via INTRAVENOUS
  Filled 2011-12-23: qty 25

## 2011-12-23 MED ORDER — GLYCOPYRROLATE 0.2 MG/ML IJ SOLN
INTRAMUSCULAR | Status: DC | PRN
  Administered 2011-12-23: .6 mg via INTRAVENOUS

## 2011-12-23 MED ORDER — METRONIDAZOLE IN NACL 5-0.79 MG/ML-% IV SOLN
500.0000 mg | Freq: Three times a day (TID) | INTRAVENOUS | Status: DC
  Filled 2011-12-23 (×2): qty 100

## 2011-12-23 MED ORDER — HYDROMORPHONE HCL PF 1 MG/ML IJ SOLN
1.0000 mg | Freq: Once | INTRAMUSCULAR | Status: AC
  Administered 2011-12-23: 1 mg via INTRAVENOUS
  Filled 2011-12-23: qty 1

## 2011-12-23 MED ORDER — NALOXONE HCL 0.4 MG/ML IJ SOLN: 0.4000 mg | INTRAMUSCULAR | Status: DC | PRN

## 2011-12-23 MED ORDER — SUCCINYLCHOLINE CHLORIDE 20 MG/ML IJ SOLN
INTRAMUSCULAR | Status: DC | PRN
  Administered 2011-12-23: 100 mg via INTRAVENOUS

## 2011-12-23 MED ORDER — FENTANYL CITRATE 0.05 MG/ML IJ SOLN
INTRAMUSCULAR | Status: DC | PRN
  Administered 2011-12-23 (×2): 100 ug via INTRAVENOUS
  Administered 2011-12-23: 50 ug via INTRAVENOUS

## 2011-12-23 MED ORDER — HYDROMORPHONE HCL PF 1 MG/ML IJ SOLN
0.2500 mg | INTRAMUSCULAR | Status: DC | PRN
  Administered 2011-12-23 (×3): 0.5 mg via INTRAVENOUS

## 2011-12-23 MED ORDER — HYDROMORPHONE HCL PF 1 MG/ML IJ SOLN
INTRAMUSCULAR | Status: AC
  Filled 2011-12-23: qty 1

## 2011-12-23 MED ORDER — ONDANSETRON HCL 4 MG/2ML IJ SOLN
4.0000 mg | Freq: Four times a day (QID) | INTRAMUSCULAR | Status: DC | PRN
  Administered 2011-12-28: 4 mg via INTRAVENOUS

## 2011-12-23 SURGICAL SUPPLY — 52 items
APPLICATOR COTTON TIP 6IN STRL (MISCELLANEOUS) ×1 IMPLANT
BARRIER SKIN 2 3/4 (OSTOMY) ×2 IMPLANT
BARRIER SKIN OD2.25 2 3/4 FLNG (OSTOMY) IMPLANT
BLADE EXTENDED COATED 6.5IN (ELECTRODE) ×1 IMPLANT
BLADE HEX COATED 2.75 (ELECTRODE) ×2 IMPLANT
BRR SKN FLT 2.75X2.25 2 PC (OSTOMY) ×1
CANISTER SUCTION 2500CC (MISCELLANEOUS) ×2 IMPLANT
CLOTH BEACON ORANGE TIMEOUT ST (SAFETY) ×2 IMPLANT
COVER MAYO STAND STRL (DRAPES) ×2 IMPLANT
DRAIN CHANNEL 19F RND (DRAIN) ×1 IMPLANT
DRAPE LAPAROSCOPIC ABDOMINAL (DRAPES) ×2 IMPLANT
DRAPE UTILITY XL STRL (DRAPES) ×2 IMPLANT
DRAPE WARM FLUID 44X44 (DRAPE) ×1 IMPLANT
DRSG PAD ABDOMINAL 8X10 ST (GAUZE/BANDAGES/DRESSINGS) ×1 IMPLANT
ELECT REM PT RETURN 9FT ADLT (ELECTROSURGICAL) ×2
ELECTRODE REM PT RTRN 9FT ADLT (ELECTROSURGICAL) ×1 IMPLANT
GLOVE BIOGEL PI IND STRL 7.0 (GLOVE) ×1 IMPLANT
GLOVE BIOGEL PI INDICATOR 7.0 (GLOVE) ×2
GLOVE ECLIPSE 8.0 STRL XLNG CF (GLOVE) ×3 IMPLANT
GLOVE INDICATOR 8.0 STRL GRN (GLOVE) ×4 IMPLANT
GOWN STRL NON-REIN LRG LVL3 (GOWN DISPOSABLE) ×3 IMPLANT
GOWN STRL REIN XL XLG (GOWN DISPOSABLE) ×4 IMPLANT
KIT BASIN OR (CUSTOM PROCEDURE TRAY) ×2 IMPLANT
LIGASURE IMPACT 36 18CM CVD LR (INSTRUMENTS) ×1 IMPLANT
NS IRRIG 1000ML POUR BTL (IV SOLUTION) ×7 IMPLANT
PACK GENERAL/GYN (CUSTOM PROCEDURE TRAY) ×2 IMPLANT
POUCH OSTOMY 2 3/4  H 3804 (WOUND CARE) ×1
POUCH OSTOMY 2 3/4 H 3804 (WOUND CARE) ×1
POUCH OSTOMY 2 PC DRNBL 2.75 (WOUND CARE) IMPLANT
RELOAD PROXIMATE 75MM BLUE (ENDOMECHANICALS) ×6 IMPLANT
RELOAD STAPLE 75 3.8 BLU REG (ENDOMECHANICALS) IMPLANT
SPONGE DRAIN TRACH 4X4 STRL 2S (GAUZE/BANDAGES/DRESSINGS) ×1 IMPLANT
SPONGE GAUZE 4X4 12PLY (GAUZE/BANDAGES/DRESSINGS) ×2 IMPLANT
SPONGE LAP 18X18 X RAY DECT (DISPOSABLE) ×1 IMPLANT
STAPLER PROXIMATE 75MM BLUE (STAPLE) ×1 IMPLANT
STAPLER VISISTAT 35W (STAPLE) ×2 IMPLANT
SUCTION POOLE TIP (SUCTIONS) ×1 IMPLANT
SUT PDS AB 1 CTX 36 (SUTURE) IMPLANT
SUT PROLENE 2 0 CT2 30 (SUTURE) ×1 IMPLANT
SUT SILK 2 0 (SUTURE) ×2
SUT SILK 2 0 SH CR/8 (SUTURE) ×1 IMPLANT
SUT SILK 2-0 18XBRD TIE 12 (SUTURE) IMPLANT
SUT SILK 3 0 (SUTURE) ×2
SUT SILK 3 0 SH CR/8 (SUTURE) ×1 IMPLANT
SUT SILK 3-0 18XBRD TIE 12 (SUTURE) IMPLANT
SUT VIC AB 3-0 SH 8-18 (SUTURE) ×1 IMPLANT
SUT VICRYL 2 0 18  UND BR (SUTURE)
SUT VICRYL 2 0 18 UND BR (SUTURE) IMPLANT
TAPE CLOTH SURG 4X10 WHT LF (GAUZE/BANDAGES/DRESSINGS) ×1 IMPLANT
TOWEL OR 17X26 10 PK STRL BLUE (TOWEL DISPOSABLE) ×4 IMPLANT
TRAY FOLEY CATH 14FRSI W/METER (CATHETERS) IMPLANT
YANKAUER SUCT BULB TIP NO VENT (SUCTIONS) ×1 IMPLANT

## 2011-12-23 NOTE — Anesthesia Postprocedure Evaluation (Signed)
  Anesthesia Post-op Note  Patient: Nicholas [name]  Procedure(s) Performed: Procedure(s) (LRB): EXPLORATORY LAPAROTOMY (N/A) APPENDECTOMY (N/A) COLON RESECTION SIGMOID (N/A)  Patient Location: PACU  Anesthesia Type: General  Level of Consciousness: awake and alert   Airway and Oxygen Therapy: Patient Spontanous Breathing  Post-op Pain: mild  Post-op Assessment: Post-op Vital signs reviewed, Patient's Cardiovascular Status Stable, Respiratory Function Stable, Patent Airway and No signs of Nausea or vomiting  Post-op Vital Signs: stable  Complications: No apparent anesthesia complications

## 2011-12-23 NOTE — Op Note (Signed)
Operative Note  Nicholas [name] male 71 y.o. 12/23/2011  PREOPERATIVE DX:  Perforated viscous  POSTOPERATIVE DX:  Perforated sigmoid colon  PROCEDURE:  Exploratory laparotomy, sigmoid colectomy, colostomy, incidental appendectomy         Surgeon: Adolph Pollack   Assistants: Claud Kelp  Anesthesia: General endotracheal anesthesia  Indications: This is a 71 year old male with the acute onset of lower abdominal pain 24 hours ago. He presented to the emergency department and was found to have a pneumoperitoneum and findings suspicious for perforated sigmoid diverticulitis. He is brought to the operating room for emergency for laparotomy for perforated viscus.    Procedure Detail:  He was brought to the operating room placed supine on the operating table and a general anesthetic was administered. The hair on the abdominal wall was clipped. A Foley catheter and nasogastric tube were inserted. The abdominal wall was sterilely prepped and draped.  A lower midline incision was made through skin subcutaneous tissue and fascia. The peritoneum was then divided and the peritoneal cavity entered. Purulent fluid was noted in the pelvis. An inflamed segment of sigmoid colon was noted and was the source of the perforation. I extended the incision above the umbilicus for better exposure. Some of the small intestine was adhered to this area and these adhesions were divided sharply mobilizing the small intestine out of the pelvis.  The appendix was also in the pelvis and this was mobilized.  I chose an area of normal colon proximal to the perforation and divided this with the GIA stapler. I then divided the rectosigmoid junction with a GIA stapler. The mesentery was was divided close to the colon and above the ureter with the LigaSure device. Small bleeding vessels were controlled with suture ligatures. The specimen was handed off the field and sent to pathology.  The left colon was mobilized by  dividing its lateral attachments to allow enough for a colostomy to be brought through the abdominal wall without tension. I then copiously irrigated out the pelvis, the central abdomen, and all 4 quadrants with saline solution. The small intestine was examined and there were no intraloop abscesses.  I decided to perform an incidental appendectomy.  The mesoappendix was divided with the LigaSure. The appendix was then amputated off the cecum with the GIA stapler. It was sent to pathology. The staple line was solid.   The rectal stump staple line was marked with 2 2-0 Prolene sutures cut long. Hemostasis was adequate at this time.  A stab wound was placed in the right lower quadrant and a size 19 Blake drain was inserted through this and placed in the pelvis. It was anchored to the skin with nylon suture.  Following this, a circular full-thickness skin incision was made in the left midabdomen and carried through the subcutaneous tissues. The anterior and posterior fascias were identified and a cruciate incision was made in these. These were dilated to 2 fingerbreadths. The descending colon stapled off stump was brought up through this defect and the descending colon anchored to the anterior fascia with interrupted 2-0 Vicryl sutures. There was no tension present.   The fascia was then closed with a running #1 PDS suture. At this time needle sponge and instrument counts were reported to be correct. The subcutaneous tissue was left open. The colostomy was matured with interrupted 3-0 Vicryl sutures. A colostomy appliance was applied.  Saline moistened gauze was then packed into the midline wound and a bulky dry dressing was applied.  He tolerated  the procedure well without any apparent complications and was taken to the recovery room in satisfactory condition.    Findings: Perforated sigmoid colon  Estimated Blood Loss:  300 mL         Drains: JACKSON-PRATT (JP)          Blood Given: none           Specimens: Sigmoid colon        Complications:  * No complications entered in OR log *         Disposition: PACU - hemodynamically stable.         Condition: stable

## 2011-12-23 NOTE — Anesthesia Preprocedure Evaluation (Addendum)
Anesthesia Evaluation  Patient identified by MRN, date of birth, ID band Patient awake    Reviewed: Allergy & Precautions, H&P , NPO status , Patient's Chart, lab work & pertinent test results  Airway Mallampati: III TM Distance: >3 FB Neck ROM: full    Dental  (+) Missing and Dental Advisory Given,    Pulmonary neg pulmonary ROS, sleep apnea and Continuous Positive Airway Pressure Ventilation ,  breath sounds clear to auscultation  Pulmonary exam normal       Cardiovascular Exercise Tolerance: Good negative cardio ROS  Rhythm:regular Rate:Normal     Neuro/Psych negative neurological ROS  negative psych ROS   GI/Hepatic negative GI ROS, Neg liver ROS,   Endo/Other  negative endocrine ROSDiabetes mellitus-, Well Controlled, Type 2Borderline DM  Renal/GU negative Renal ROS  negative genitourinary   Musculoskeletal   Abdominal   Peds  Hematology negative hematology ROS (+)   Anesthesia Other Findings   Reproductive/Obstetrics negative OB ROS                          Anesthesia Physical Anesthesia Plan  ASA: III and Emergent  Anesthesia Plan: General ETT and General   Post-op Pain Management:    Induction: Intravenous  Airway Management Planned: Oral ETT  Additional Equipment:   Intra-op Plan:   Post-operative Plan: Extubation in OR  Informed Consent: I have reviewed the patients History and Physical, chart, labs and discussed the procedure including the risks, benefits and alternatives for the proposed anesthesia with the patient or authorized representative who has indicated his/her understanding and acceptance.   Dental Advisory Given  Plan Discussed with: CRNA and Surgeon  Anesthesia Plan Comments:         Anesthesia Quick Evaluation

## 2011-12-23 NOTE — Transfer of Care (Signed)
Immediate Anesthesia Transfer of Care Note  Patient: Nicholas [name]  Procedure(s) Performed: Procedure(s) (LRB): EXPLORATORY LAPAROTOMY (N/A) APPENDECTOMY (N/A) COLON RESECTION SIGMOID (N/A)  Patient Location: PACU  Anesthesia Type: General  Level of Consciousness: awake and alert   Airway & Oxygen Therapy: Patient Spontanous Breathing and Patient connected to face mask oxygen  Post-op Assessment: Report given to PACU RN and Post -op Vital signs reviewed and stable  Post vital signs: Reviewed and stable  Complications: No apparent anesthesia complications

## 2011-12-23 NOTE — ED Provider Notes (Signed)
Medical screening examination/treatment/procedure(s) were conducted as a shared visit with non-physician practitioner(s) and myself.  I personally evaluated the patient during the encounter.  71 year old male with abdominal pain. Abdomen is diffusely tender with voluntary guarding. CT scan shows evidence of sigmoid diverticulitis with perforation. There is a moderate amount of free air. Surgery was consulted that, Marlyne Beards. Will evaluate patient in the emergency room. Patient is n.p.o. Abx ordered. Patient does have a prior surgical history significant for cholecystectomy done approximately 20 years ago. Patient cannot recall the name of the surgeon that did then.  Raeford Razor, MD 12/24/11 [phone]

## 2011-12-23 NOTE — H&P (Signed)
Nicholas [name] is an 71 y.o. male.   Chief Complaint: Severe lower abdominal pain HPI: This is a 71 year old male in his usual state of health until yesterday 3:30 PM when he developed the abrupt onset of severe sharp and twisting lower bowel pain that persisted. He tried to void or have a bowel movement and neither of these helped with the pain. He had a fever of 103 and chills last night. He presented to the emergency department and was evaluated. A CT scan demonstrated a pneumoperitoneum with inflammatory changes around the sigmoid colon consistent with perforated sigmoid diverticulitis. He was started on intravenous ciprofloxacin and Flagyl and we were called to see him.  He has been taking a Z-Pak for an upper respiratory infection.  Past Medical History  Diagnosis Date  . Diabetes mellitus     boarderline  . OSA on CPAP   . BPH (benign prostatic hypertrophy)   . Neuropathy       Chronic pain syndrome  Past Surgical History  Procedure Date  . Cholecystectomy     Left hand and shoulder surgery.  Family History  Problem Relation Age of Onset  . Diabetes Mother   . Cancer Mother     skin  . Cancer Father     Leukemia   Social History:  reports that he has quit smoking. He does not have any smokeless tobacco history on file. He reports that he does not drink alcohol. His drug history not on file.  Allergies:  Allergies  Allergen Reactions  . Sudafed (Pseudoephedrine Hcl)     "makes me feel drunk"    Medications Prior to Admission  Medication Dose Route Frequency Provider Last Rate Last Dose  . 0.9 %  sodium chloride infusion   Intravenous Once Scarlette Calico C. [place], PA 50 mL/hr at 12/23/11 1043    . ciprofloxacin (CIPRO) IVPB 400 mg  400 mg Intravenous Once Scarlette Calico C. [place], [place]      . HYDROmorphone (DILAUDID) injection 1 mg  1 mg Intravenous Once Scarlette Calico C. [place], PA   1 mg at 12/23/11 1042  . iohexol (OMNIPAQUE) 300 MG/ML solution 100 mL  100 mL Intravenous Once PRN  Medication Radiologist, MD   100 mL at 12/23/11 1346  . ketorolac (TORADOL) 30 MG/ML injection 30 mg  30 mg Intravenous Once Scarlette Calico C. [place], PA   30 mg at 12/23/11 1308  . metroNIDAZOLE (FLAGYL) IVPB 500 mg  500 mg Intravenous Once Scarlette Calico C. [place], PA      . ondansetron [facility]) injection 4 mg  4 mg Intravenous Once Scarlette Calico C. [place], [place]   4 mg at 12/23/11 1042   Medications Prior to Admission  Medication Sig Dispense Refill  . gabapentin (NEURONTIN) 300 MG capsule TAKE ONE CAPSULE BY MOUTH 3 TIMES A DAY  90 capsule  2  . metformin (FORTAMET) 500 MG (OSM) 24 hr tablet Take 1 tablet (500 mg total) by mouth daily.  30 tablet  6  . Tamsulosin HCl (FLOMAX) 0.4 MG CAPS Take 0.8 mg by mouth at bedtime.       . traMADol (ULTRAM) 50 MG tablet TAKE 1 TABLET BY MOUTH EVERY 6 HOURS AS NEEDED FOR PAIN  180 tablet  3   Prior to Admission medications   Medication Sig Start Date End Date Taking? Authorizing Provider  acetaminophen (TYLENOL) 500 MG tablet Take 1,000 mg by mouth every 6 (six) hours as needed. For pain.   Yes Historical Provider, MD  aspirin EC 81 MG  tablet Take 81 mg by mouth daily.   Yes Historical Provider, MD  gabapentin (NEURONTIN) 300 MG capsule TAKE ONE CAPSULE BY MOUTH 3 TIMES A DAY 12/07/11  Yes Dianne Dun, MD  metformin (FORTAMET) 500 MG (OSM) 24 hr tablet Take 1 tablet (500 mg total) by mouth daily. 08/15/11  Yes Dianne Dun, MD  naproxen sodium (ANAPROX) 220 MG tablet Take 220 mg by mouth 2 (two) times daily as needed. For pain.   Yes Historical Provider, MD  Tamsulosin HCl (FLOMAX) 0.4 MG CAPS Take 0.8 mg by mouth at bedtime.    Yes Historical Provider, MD  traMADol (ULTRAM) 50 MG tablet TAKE 1 TABLET BY MOUTH EVERY 6 HOURS AS NEEDED FOR PAIN 09/28/11  Yes Dianne Dun, MD  azithromycin (ZITHROMAX) 250 MG tablet Take 250-500 mg by mouth daily.    Historical Provider, MD   Results for orders placed during the hospital encounter of 12/23/11 (from the past 48 hour(s))  CBC      Status: Abnormal   Collection Time   12/23/11 10:35 AM      Component Value Range Comment   WBC 12.9 (*) 4.0 - 10.5 (K/uL)    RBC 4.71  4.22 - 5.81 (MIL/uL)    Hemoglobin 15.0  13.0 - 17.0 (g/dL)    HCT 96.0  45.4 - 09.8 (%)    MCV 87.9  78.0 - 100.0 (fL)    MCH 31.8  26.0 - 34.0 (pg)    MCHC 36.2 (*) 30.0 - 36.0 (g/dL)    RDW 11.9  14.7 - 82.9 (%)    Platelets 165  150 - 400 (K/uL)   DIFFERENTIAL     Status: Abnormal   Collection Time   12/23/11 10:35 AM      Component Value Range Comment   Neutrophils Relative 88 (*) 43 - 77 (%)    Neutro Abs 11.4 (*) 1.7 - 7.7 (K/uL)    Lymphocytes Relative 5 (*) 12 - 46 (%)    Lymphs Abs 0.6 (*) 0.7 - 4.0 (K/uL)    Monocytes Relative 7  3 - 12 (%)    Monocytes Absolute 0.9  0.1 - 1.0 (K/uL)    Eosinophils Relative 0  0 - 5 (%)    Eosinophils Absolute 0.0  0.0 - 0.7 (K/uL)    Basophils Relative 0  0 - 1 (%)    Basophils Absolute 0.0  0.0 - 0.1 (K/uL)   COMPREHENSIVE METABOLIC PANEL     Status: Abnormal   Collection Time   12/23/11 10:35 AM      Component Value Range Comment   Sodium 135  135 - 145 (mEq/L)    Potassium 3.7  3.5 - 5.1 (mEq/L)    Chloride 98  96 - 112 (mEq/L)    CO2 28  19 - 32 (mEq/L)    Glucose, Bld 143 (*) 70 - 99 (mg/dL)    BUN 14  6 - 23 (mg/dL)    Creatinine, Ser 5.62  0.50 - 1.35 (mg/dL)    Calcium 9.8  8.4 - 10.5 (mg/dL)    Total Protein 7.4  6.0 - 8.3 (g/dL)    Albumin 3.8  3.5 - 5.2 (g/dL)    AST 17  0 - 37 (U/L)    ALT 19  0 - 53 (U/L)    Alkaline Phosphatase 57  39 - 117 (U/L)    Total Bilirubin 1.1  0.3 - 1.2 (mg/dL)    GFR calc non Af [place]yse Dago  82 (*) >90 (mL/min)    GFR calc Af Amer >90  >90 (mL/min)   LIPASE, BLOOD     Status: Normal   Collection Time   12/23/11 10:35 AM      Component Value Range Comment   Lipase 18  11 - 59 (U/L)   URINALYSIS, ROUTINE W REFLEX MICROSCOPIC     Status: Abnormal   Collection Time   12/23/11 10:51 AM      Component Value Range Comment   Color, Urine YELLOW  YELLOW      APPearance CLEAR  CLEAR     Specific Gravity, Urine 1.013  1.005 - 1.030     pH 7.0  5.0 - 8.0     Glucose, UA NEGATIVE  NEGATIVE (mg/dL)    Hgb urine dipstick SMALL (*) NEGATIVE     Bilirubin Urine NEGATIVE  NEGATIVE     Ketones, ur NEGATIVE  NEGATIVE (mg/dL)    Protein, ur NEGATIVE  NEGATIVE (mg/dL)    Urobilinogen, UA 1.0  0.0 - 1.0 (mg/dL)    Nitrite NEGATIVE  NEGATIVE     Leukocytes, UA NEGATIVE  NEGATIVE    URINE MICROSCOPIC-ADD ON     Status: Normal   Collection Time   12/23/11 10:51 AM      Component Value Range Comment   WBC, UA 0-2  <3 (WBC/hpf)    RBC / HPF 3-6  <3 (RBC/hpf)    Bacteria, UA RARE  RARE     Ct Abdomen Pelvis W Contrast  12/23/2011  *RADIOLOGY REPORT*  Clinical Data: Abdominal pain, nausea.  CT ABDOMEN AND PELVIS WITH CONTRAST  Technique:  Multidetector CT imaging of the abdomen and pelvis was performed following the standard protocol during bolus administration of intravenous contrast.  Contrast:  11/05/2009  Comparison: None.  Findings: There is free intraperitoneal air noted.  The probable source is the sigmoid colon.  Diverticulosis changes with surrounding inflammatory stranding compatible with active diverticulitis.  Secondary reactive wall thickening within the adjacent small bowel loops.  No other focal bowel abnormality noted.  Heart is normal size.  Lung bases are clear.  Liver, spleen, pancreas, adrenals and kidneys are unremarkable. Mild aneurysmal dilatation of the infrarenal abdominal aorta, 3.1 cm maximally.  Foley catheter is in place with the bladder decompressed.  IMPRESSION: Sigmoid diverticulosis and active diverticulitis with evidence of bowel perforation.  Free air scattered throughout the abdomen and pelvis, most pronounced adjacent to the liver in the upper abdomen.  These results were called by telephone on 12/23/2011  at  1/55 the to  [facility], who verbally acknowledged these results.  Original Report Authenticated By: Cyndie Chime, M.D.      Review of Systems  Constitutional: Positive for fever and chills.  HENT: Positive for congestion.   Respiratory: Negative.   Cardiovascular: Negative for chest pain.  Gastrointestinal: Positive for abdominal pain and constipation. Negative for blood in stool.  Genitourinary: Negative for dysuria.  Musculoskeletal: Positive for joint pain.  Neurological: Negative.   Endo/Heme/Allergies: Does not bruise/bleed easily.    Blood pressure 126/57, pulse 92, temperature 97.8 F (36.6 C), temperature source Oral, resp. rate 16, SpO2 95.00%. Physical Exam  Constitutional:       Ill appearing overweight male.  HENT:  Head: Normocephalic and atraumatic.  Eyes: EOM are normal. No scleral icterus.  Neck: Normal range of motion.  Cardiovascular: Normal rate and regular rhythm.   Respiratory: Effort normal and breath sounds normal.  GI: Soft. He exhibits no mass.  There is tenderness (diffuse to palpation and percussion). There is guarding.       Right subcostal scar  Musculoskeletal: He exhibits edema (RLE-chronic).       Right hand and shoulder scars  Lymphadenopathy:    He has no cervical adenopathy.  Neurological: He is alert.  Skin: Skin is warm and dry.  Psychiatric: He has a normal mood and affect. His behavior is normal.     Assessment/Plan Acute abdomen secondary to bowel perforation most likely perforated sigmoid diverticulitis.  Plan. Emergency exploratory laparotomy with possible partial colectomy and colostomy.  I have explained the procedure and risks of colon resection and colostomy.  Risks include but are not limited to bleeding, infection, wound problems, anesthesia, problems with the colostomy, injury to intraabominal organs (such as intestine, spleen, kidney, bladder, ureter, etc.), ileus, irregular bowel habits.  He seems to understand and agrees to proceed.  Benjamine Strout J 12/23/2011, 2:26 PM

## 2011-12-23 NOTE — Progress Notes (Signed)
Spoke with patient about CPAP, pt wears at home although states would prefer to not wear tonight and was concerned about comfort of mask with NG tube in place. Pt is on 2L via nasal cannula. VSS. RT will assist as needed and patient instructed to call if at any point this changes. RN aware.

## 2011-12-23 NOTE — Anesthesia Procedure Notes (Signed)
Procedure Name: Intubation Date/Time: 12/23/2011 3:50 PM Performed by: [place], Caydee Talkington C Pre-anesthesia Checklist: Patient identified, Timeout performed, Emergency Drugs available, Suction available and Patient being monitored Patient Re-evaluated:Patient Re-evaluated prior to inductionOxygen Delivery Method: Circle system utilized Preoxygenation: Pre-oxygenation with 100% oxygen Intubation Type: IV induction, Rapid sequence and Cricoid Pressure applied Laryngoscope Size: Mac and 4 Grade View: Grade I Tube type: Oral Tube size: 7.5 mm Number of attempts: 1 Airway Equipment and Method: Stylet Placement Confirmation: ETT inserted through vocal cords under direct vision,  breath sounds checked- equal and bilateral,  positive ETCO2 and CO2 detector Secured at: 21 cm Tube secured with: Tape Dental Injury: Teeth and Oropharynx as per pre-operative assessment

## 2011-12-23 NOTE — H&P (Signed)
Nicholas [name] is an 71 y.o. male.   Chief Complaint: Abdominal pain started yesterday HPI: The patient is a 71 year old gentleman who had the onset of abdominal pain yesterday. He's become progressively worse and was finally brought to the emergency room this morning around 9 AM. Admission labs shows white count of 12,900. CT scan was obtained that showed sigmoid diverticulitis with evidence of bowel perforation with scattered free air throughout the abdomen the pelvis most pronounced adjacent to the liver and the upper abdomen. The ER physician contacted [place] and he was seen in the emergency room and evaluated by Dr. Abbey Chatters. His opinion patient has a perforated sigmoid diverticulitis and requires surgery at this time. We anticipate a colostomy this was discussed with the patient's wife in detail. Antibiotics have been ordered, preadmission lab studies are being completed, we plan to the operating room as soon as possible.  Past Medical History  Diagnosis Date  . Diabetes mellitus on Metformin       . OSA on CPAP  /  Wife will bring in tomorrow.  He does not know settings   . BPH (benign prostatic hypertrophy)   . Neuropathy both lower legs and feet. Chronic arm and hand pain after trauma BMI 34.4 TOBACCO USE 40 YEARS 1-3PPD Cardiac catheterization during sleep apnea reportedly showed nonobstructive disease.      Past Surgical History  Procedure Date  . Cholecystectomy Bilateral hand and arm surgery on Right after trauma     Family History  Problem Relation Age of Onset  . Diabetes Mother   . Cancer Mother     skin  . Cancer Father     Leukemia   Social History:  reports that he has quit smoking. He does not have any smokeless tobacco history on file. He reports that he does not drink alcohol. His drug history not on file.  Allergies:  Allergies  Allergen Reactions  . Sudafed (Pseudoephedrine Hcl)     "makes me feel drunk"  HOME MEDS: Tramadol 50 mg when necessary, Flomax 0.4  mg, 2 daily, gabapentin 300 mg 3 times a day, metformin 500 mg daily  Medications Prior to Admission  Medication Dose Route Frequency Provider Last Rate Last Dose  . 0.9 %  sodium chloride infusion   Intravenous Once Scarlette Calico C. [place], PA 50 mL/hr at 12/23/11 1043    . ciprofloxacin (CIPRO) IVPB 400 mg  400 mg Intravenous Once Scarlette Calico C. [place], [place]      . HYDROmorphone (DILAUDID) injection 1 mg  1 mg Intravenous Once Scarlette Calico C. [place], PA   1 mg at 12/23/11 1042  . iohexol (OMNIPAQUE) 300 MG/ML solution 100 mL  100 mL Intravenous Once PRN Medication Radiologist, MD   100 mL at 12/23/11 1346  . ketorolac (TORADOL) 30 MG/ML injection 30 mg  30 mg Intravenous Once Scarlette Calico C. [place], PA   30 mg at 12/23/11 1308  . metroNIDAZOLE (FLAGYL) IVPB 500 mg  500 mg Intravenous Once Scarlette Calico C. [place], [place]      . ondansetron [facility]) injection 4 mg  4 mg Intravenous Once Scarlette Calico C. [place], [place]   4 mg at 12/23/11 1042   Medications Prior to Admission  Medication Sig Dispense Refill  . gabapentin (NEURONTIN) 300 MG capsule TAKE ONE CAPSULE BY MOUTH 3 TIMES A DAY  90 capsule  2  . metformin (FORTAMET) 500 MG (OSM) 24 hr tablet Take 1 tablet (500 mg total) by mouth daily.  30 tablet  6  . Tamsulosin HCl (  FLOMAX) 0.4 MG CAPS Take 0.8 mg by mouth at bedtime.       . traMADol (ULTRAM) 50 MG tablet TAKE 1 TABLET BY MOUTH EVERY 6 HOURS AS NEEDED FOR PAIN  180 tablet  3    Results for orders placed during the hospital encounter of 12/23/11 (from the past 48 hour(s))  CBC     Status: Abnormal   Collection Time   12/23/11 10:35 AM      Component Value Range Comment   WBC 12.9 (*) 4.0 - 10.5 (K/uL)    RBC 4.71  4.22 - 5.81 (MIL/uL)    Hemoglobin 15.0  13.0 - 17.0 (g/dL)    HCT 09.8  11.9 - 14.7 (%)    MCV 87.9  78.0 - 100.0 (fL)    MCH 31.8  26.0 - 34.0 (pg)    MCHC 36.2 (*) 30.0 - 36.0 (g/dL)    RDW 82.9  56.2 - 13.0 (%)    Platelets 165  150 - 400 (K/uL)   DIFFERENTIAL     Status: Abnormal   Collection  Time   12/23/11 10:35 AM      Component Value Range Comment   Neutrophils Relative 88 (*) 43 - 77 (%)    Neutro Abs 11.4 (*) 1.7 - 7.7 (K/uL)    Lymphocytes Relative 5 (*) 12 - 46 (%)    Lymphs Abs 0.6 (*) 0.7 - 4.0 (K/uL)    Monocytes Relative 7  3 - 12 (%)    Monocytes Absolute 0.9  0.1 - 1.0 (K/uL)    Eosinophils Relative 0  0 - 5 (%)    Eosinophils Absolute 0.0  0.0 - 0.7 (K/uL)    Basophils Relative 0  0 - 1 (%)    Basophils Absolute 0.0  0.0 - 0.1 (K/uL)   COMPREHENSIVE METABOLIC PANEL     Status: Abnormal   Collection Time   12/23/11 10:35 AM      Component Value Range Comment   Sodium 135  135 - 145 (mEq/L)    Potassium 3.7  3.5 - 5.1 (mEq/L)    Chloride 98  96 - 112 (mEq/L)    CO2 28  19 - 32 (mEq/L)    Glucose, Bld 143 (*) 70 - 99 (mg/dL)    BUN 14  6 - 23 (mg/dL)    Creatinine, Ser 8.65  0.50 - 1.35 (mg/dL)    Calcium 9.8  8.4 - 10.5 (mg/dL)    Total Protein 7.4  6.0 - 8.3 (g/dL)    Albumin 3.8  3.5 - 5.2 (g/dL)    AST 17  0 - 37 (U/L)    ALT 19  0 - 53 (U/L)    Alkaline Phosphatase 57  39 - 117 (U/L)    Total Bilirubin 1.1  0.3 - 1.2 (mg/dL)    GFR calc non Af Amer 82 (*) >90 (mL/min)    GFR calc Af Amer >90  >90 (mL/min)   LIPASE, BLOOD     Status: Normal   Collection Time   12/23/11 10:35 AM      Component Value Range Comment   Lipase 18  11 - 59 (U/L)   URINALYSIS, ROUTINE W REFLEX MICROSCOPIC     Status: Abnormal   Collection Time   12/23/11 10:51 AM      Component Value Range Comment   Color, Urine YELLOW  YELLOW     APPearance CLEAR  CLEAR     Specific Gravity, Urine 1.013  1.005 -  1.030     pH 7.0  5.0 - 8.0     Glucose, UA NEGATIVE  NEGATIVE (mg/dL)    Hgb urine dipstick SMALL (*) NEGATIVE     Bilirubin Urine NEGATIVE  NEGATIVE     Ketones, ur NEGATIVE  NEGATIVE (mg/dL)    Protein, ur NEGATIVE  NEGATIVE (mg/dL)    Urobilinogen, UA 1.0  0.0 - 1.0 (mg/dL)    Nitrite NEGATIVE  NEGATIVE     Leukocytes, UA NEGATIVE  NEGATIVE    URINE MICROSCOPIC-ADD ON      Status: Normal   Collection Time   12/23/11 10:51 AM      Component Value Range Comment   WBC, UA 0-2  <3 (WBC/hpf)    RBC / HPF 3-6  <3 (RBC/hpf)    Bacteria, UA RARE  RARE     Ct Abdomen Pelvis W Contrast  12/23/2011  *RADIOLOGY REPORT*  Clinical Data: Abdominal pain, nausea.  CT ABDOMEN AND PELVIS WITH CONTRAST  Technique:  Multidetector CT imaging of the abdomen and pelvis was performed following the standard protocol during bolus administration of intravenous contrast.  Contrast:  11/05/2009  Comparison: None.  Findings: There is free intraperitoneal air noted.  The probable source is the sigmoid colon.  Diverticulosis changes with surrounding inflammatory stranding compatible with active diverticulitis.  Secondary reactive wall thickening within the adjacent small bowel loops.  No other focal bowel abnormality noted.  Heart is normal size.  Lung bases are clear.  Liver, spleen, pancreas, adrenals and kidneys are unremarkable. Mild aneurysmal dilatation of the infrarenal abdominal aorta, 3.1 cm maximally.  Foley catheter is in place with the bladder decompressed.  IMPRESSION: Sigmoid diverticulosis and active diverticulitis with evidence of bowel perforation.  Free air scattered throughout the abdomen and pelvis, most pronounced adjacent to the liver in the upper abdomen.  These results were called by telephone on 12/23/2011  at  1/55 the to  [facility], who verbally acknowledged these results.  Original Report Authenticated By: Cyndie Chime, M.D.   Dg Chest Port 1 View  12/23/2011  *RADIOLOGY REPORT*  Clinical Data: Preop  PORTABLE CHEST - 1 VIEW  Comparison: None.  Findings: Cardiomediastinal silhouette is unremarkable.  No acute infiltrate or pleural effusion.  No pulmonary edema.  Bony thorax is unremarkable.  IMPRESSION: No active disease.  Original Report Authenticated By: Natasha Mead, M.D.    Review of Systems  Constitutional: Positive for fever and chills. Negative for weight  loss, malaise/fatigue and diaphoresis.  Eyes: Negative.   Respiratory: Positive for cough, sputum production, shortness of breath (DOE) and wheezing.        Uses CPAP to breath at night.  Does not know setting i have ask wife to bring in tomorrow.  SINUS issues/infection, with draining sinus make breathing more difficult  Cardiovascular: Positive for orthopnea and leg swelling.  Gastrointestinal: Positive for nausea, abdominal pain and constipation. Negative for vomiting, diarrhea, blood in stool and melena.  Genitourinary:       Chronic problems with starting and stopping urine  Musculoskeletal:       Chronic arm pain from trauma. Bilat lower extremity neuropathy on neurontin  Skin: Negative.   Neurological: Negative.  Negative for weakness.  Endo/Heme/Allergies: Negative.   Psychiatric/Behavioral: Negative.     Blood pressure 126/57, pulse 92, temperature 97.8 F (36.6 C), temperature source Oral, resp. rate 16, SpO2 95.00%. Physical Exam  Constitutional: He is oriented to person, place, and time. He appears well-developed and well-nourished. No distress.  Overweight  NAD  HENT:  Head: Normocephalic.  Nose: Nose normal.  Mouth/Throat: Oropharynx is clear and moist.  Eyes: Conjunctivae and EOM are normal. Pupils are equal, round, and reactive to light. Right eye exhibits no discharge. Left eye exhibits no discharge. No scleral icterus.  Neck: Normal range of motion. Neck supple. No JVD present. No tracheal deviation present. No thyromegaly present.  Cardiovascular: Exam reveals no gallop and no friction rub.   Murmur (I-II/VI SEM) heard.      HR 105 RANGE 154/75 92-94% SAT ON r/a.  Respiratory: Effort normal and breath sounds normal. No respiratory distress. He has no wheezes. He has no rales. He exhibits no tenderness.  GI: He exhibits distension. He exhibits no mass. There is tenderness (diffusely tender, all over). There is rebound and guarding.       Hypoactive BS    Musculoskeletal: He exhibits edema (trace in LE).  Lymphadenopathy:    He has no cervical adenopathy.  Neurological: He is alert and oriented to person, place, and time. No cranial nerve deficit.  Skin: Skin is warm and dry. No rash noted. He is not diaphoretic. No erythema.  Psychiatric: He has a normal mood and affect. His behavior is normal. Judgment and thought content normal.     Assessment/Plan 1.Sigmoid diverticulitis with perforation. 2. Diabetes mellitus on oral medication. 3. Obstructive sleep apnea with CPAP. 4. Chronic hand and arm pain status post trauma 5. Lower extremity neuropathy. 6. Significant BPH 7. History of heavy tobacco use. 8. BMI of 34.4  Plan: Patient's been prepped currently and we'll plan to the OR soon as possible. Further workup and treatment as indicated. Risk and benefits were discussed with the patient and his wife by Dr. Abbey Chatters. Will [facility] physician assistant for Dr. Avel Peace.  Anica Alcaraz 12/23/2011, 2:36 PM

## 2011-12-23 NOTE — ED Notes (Signed)
Per EMS.  Pt has been having lower abd pain radiating to groin with nausea and dry heaves since yesterday at 1500.  Pt states he has new trouble urinating.  EMS states abd feels tight and is tender.

## 2011-12-23 NOTE — ED Notes (Signed)
JXB:[id number]<GN> Expected date:12/23/11<BR> Expected time: 9:21 AM<BR> Means of arrival:Ambulance<BR> Comments:<BR> Urinary retention

## 2011-12-23 NOTE — ED Provider Notes (Signed)
History     CSN: [id number]  Arrival date & time 12/23/11  1308   First MD Initiated Contact with Patient 12/23/11 1003      Chief Complaint  Patient presents with  . Abdominal Pain  . Dysuria  . Nausea    (Consider location/radiation/quality/duration/timing/severity/associated sxs/prior treatment) HPI Comments: Patient here with lower abdominal pain with radiation into his right groin - reports pain and fever started yesterday - over the night last night wife reports fever over 103.  Reports dribbling urine and now with worsening pain that radiates above the umbilicus.  Last urination was earlier today but continues to dribble.  Reports abdomen feels "tight" and cannot put his hands on it.   Patient is a 71 y.o. male presenting with abdominal pain and dysuria. The history is provided by the patient and the spouse. No language interpreter was used.  Abdominal Pain The primary symptoms of the illness include abdominal pain, fever, nausea, vomiting and dysuria. The primary symptoms of the illness do not include fatigue, shortness of breath, diarrhea, hematemesis or hematochezia. The current episode started yesterday. The onset of the illness was gradual. The problem has been gradually worsening.  The abdominal pain began 6 to 12 hours ago. The pain came on gradually. The abdominal pain has been gradually worsening since its onset. The abdominal pain is located in the LLQ, RLQ and suprapubic region. The abdominal pain radiates to the epigastric region. The severity of the abdominal pain is 10/10. The abdominal pain is relieved by nothing.  The fever began yesterday. The fever has been gradually worsening since its onset. The maximum temperature recorded prior to his arrival was 103 to 104 F. The temperature was taken by an oral thermometer.  The dysuria is associated with frequency. The dysuria is not associated with hematuria or urgency.  The patient states that she believes she is currently  not pregnant. The patient has not had a change in bowel habit. Additional symptoms associated with the illness include chills, anorexia and frequency. Symptoms associated with the illness do not include diaphoresis, heartburn, constipation, urgency, hematuria or back pain.  Dysuria  Associated symptoms include chills, nausea, vomiting and frequency. Pertinent negatives include no hematuria and no urgency.    Past Medical History  Diagnosis Date  . Diabetes mellitus     boarderline  . OSA on CPAP   . BPH (benign prostatic hypertrophy)   . Neuropathy     Past Surgical History  Procedure Date  . Cholecystectomy     Family History  Problem Relation Age of Onset  . Diabetes Mother   . Cancer Mother     skin  . Cancer Father     Leukemia    History  Substance Use Topics  . Smoking status: Former Games developer  . Smokeless tobacco: Not on file  . Alcohol Use: No      Review of Systems  Constitutional: Positive for fever and chills. Negative for diaphoresis and fatigue.  Respiratory: Negative for shortness of breath.   Gastrointestinal: Positive for nausea, vomiting, abdominal pain and anorexia. Negative for heartburn, diarrhea, constipation, hematochezia and hematemesis.  Genitourinary: Positive for dysuria and frequency. Negative for urgency and hematuria.  Musculoskeletal: Negative for back pain.  All other systems reviewed and are negative.    Allergies  Sudafed  Home Medications   Current Outpatient Rx  Name Route Sig Dispense Refill  . GABAPENTIN 300 MG PO CAPS  TAKE ONE CAPSULE BY MOUTH 3 TIMES A DAY 90  capsule 2  . METFORMIN HCL ER (OSM) 500 MG PO TB24 Oral Take 1 tablet (500 mg total) by mouth daily. 30 tablet 6  . NAPROXEN SODIUM 220 MG PO TABS Oral Take 1 tablet (220 mg total) by mouth 2 (two) times daily with a meal. 60 tablet 0  . TAMSULOSIN HCL 0.4 MG PO CAPS  Take two tablets by mouth at bedtime     . TRAMADOL HCL 50 MG PO TABS  TAKE 1 TABLET BY MOUTH EVERY 6  HOURS AS NEEDED FOR PAIN 180 tablet 3    BP 126/57  Pulse 92  Temp(Src) 97.8 F (36.6 C) (Oral)  Resp 16  SpO2 95%  Physical Exam  Nursing note and vitals reviewed. Constitutional: He is oriented to person, place, and time. He appears well-developed and well-nourished. He appears distressed.  HENT:  Head: Normocephalic and atraumatic.  Right Ear: External ear normal.  Left Ear: External ear normal.  Nose: Nose normal.  Mouth/Throat: Oropharynx is clear and moist. No oropharyngeal exudate.  Eyes: Conjunctivae are normal. Pupils are equal, round, and reactive to light. No scleral icterus.  Neck: Normal range of motion. Neck supple.  Cardiovascular: Normal rate, regular rhythm and normal heart sounds.  Exam reveals no gallop and no friction rub.   No murmur heard. Pulmonary/Chest: Effort normal and breath sounds normal. No respiratory distress. He has no wheezes. He has no rales. He exhibits no tenderness.  Abdominal: Bowel sounds are normal. He exhibits distension. He exhibits no mass. There is generalized tenderness. There is guarding. There is no CVA tenderness.    Musculoskeletal: Normal range of motion. He exhibits edema. He exhibits no tenderness.       2+ pitting edema  Lymphadenopathy:    He has no cervical adenopathy.  Neurological: He is alert and oriented to person, place, and time. No cranial nerve deficit.  Skin: Skin is warm and dry. No rash noted. No erythema. No pallor.  Psychiatric: He has a normal mood and affect. His behavior is normal. Judgment and thought content normal.    ED Course  Procedures (including critical care time)   Labs Reviewed  CBC  DIFFERENTIAL  COMPREHENSIVE METABOLIC PANEL  LIPASE, BLOOD  URINALYSIS, ROUTINE W REFLEX MICROSCOPIC  URINE CULTURE   No results found.  Results for orders placed during the hospital encounter of 12/23/11  CBC      Component Value Range   WBC 12.9 (*) 4.0 - 10.5 (K/uL)   RBC 4.71  4.22 - 5.81 (MIL/uL)     Hemoglobin 15.0  13.0 - 17.0 (g/dL)   HCT 28.4  13.2 - 44.0 (%)   MCV 87.9  78.0 - 100.0 (fL)   MCH 31.8  26.0 - 34.0 (pg)   MCHC 36.2 (*) 30.0 - 36.0 (g/dL)   RDW 10.2  72.5 - 36.6 (%)   Platelets 165  150 - 400 (K/uL)  DIFFERENTIAL      Component Value Range   Neutrophils Relative 88 (*) 43 - 77 (%)   Neutro Abs 11.4 (*) 1.7 - 7.7 (K/uL)   Lymphocytes Relative 5 (*) 12 - 46 (%)   Lymphs Abs 0.6 (*) 0.7 - 4.0 (K/uL)   Monocytes Relative 7  3 - 12 (%)   Monocytes Absolute 0.9  0.1 - 1.0 (K/uL)   Eosinophils Relative 0  0 - 5 (%)   Eosinophils Absolute 0.0  0.0 - 0.7 (K/uL)   Basophils Relative 0  0 - 1 (%)   Basophils  Absolute 0.0  0.0 - 0.1 (K/uL)  COMPREHENSIVE METABOLIC PANEL      Component Value Range   Sodium 135  135 - 145 (mEq/L)   Potassium 3.7  3.5 - 5.1 (mEq/L)   Chloride 98  96 - 112 (mEq/L)   CO2 28  19 - 32 (mEq/L)   Glucose, Bld 143 (*) 70 - 99 (mg/dL)   BUN 14  6 - 23 (mg/dL)   Creatinine, Ser 5.40  0.50 - 1.35 (mg/dL)   Calcium 9.8  8.4 - 98.1 (mg/dL)   Total Protein 7.4  6.0 - 8.3 (g/dL)   Albumin 3.8  3.5 - 5.2 (g/dL)   AST 17  0 - 37 (U/L)   ALT 19  0 - 53 (U/L)   Alkaline Phosphatase 57  39 - 117 (U/L)   Total Bilirubin 1.1  0.3 - 1.2 (mg/dL)   GFR calc non Af Amer 82 (*) >90 (mL/min)   GFR calc Af Amer >90  >90 (mL/min)  LIPASE, BLOOD      Component Value Range   Lipase 18  11 - 59 (U/L)  URINALYSIS, ROUTINE W REFLEX MICROSCOPIC      Component Value Range   Color, Urine YELLOW  YELLOW    APPearance CLEAR  CLEAR    Specific Gravity, Urine 1.013  1.005 - 1.030    pH 7.0  5.0 - 8.0    Glucose, UA NEGATIVE  NEGATIVE (mg/dL)   Hgb urine dipstick SMALL (*) NEGATIVE    Bilirubin Urine NEGATIVE  NEGATIVE    Ketones, ur NEGATIVE  NEGATIVE (mg/dL)   Protein, ur NEGATIVE  NEGATIVE (mg/dL)   Urobilinogen, UA 1.0  0.0 - 1.0 (mg/dL)   Nitrite NEGATIVE  NEGATIVE    Leukocytes, UA NEGATIVE  NEGATIVE   URINE MICROSCOPIC-ADD ON      Component Value Range    WBC, UA 0-2  <3 (WBC/hpf)   RBC / HPF 3-6  <3 (RBC/hpf)   Bacteria, UA RARE  RARE    Ct Abdomen Pelvis W Contrast  12/23/2011  *RADIOLOGY REPORT*  Clinical Data: Abdominal pain, nausea.  CT ABDOMEN AND PELVIS WITH CONTRAST  Technique:  Multidetector CT imaging of the abdomen and pelvis was performed following the standard protocol during bolus administration of intravenous contrast.  Contrast:  11/05/2009  Comparison: None.  Findings: There is free intraperitoneal air noted.  The probable source is the sigmoid colon.  Diverticulosis changes with surrounding inflammatory stranding compatible with active diverticulitis.  Secondary reactive wall thickening within the adjacent small bowel loops.  No other focal bowel abnormality noted.  Heart is normal size.  Lung bases are clear.  Liver, spleen, pancreas, adrenals and kidneys are unremarkable. Mild aneurysmal dilatation of the infrarenal abdominal aorta, 3.1 cm maximally.  Foley catheter is in place with the bladder decompressed.  IMPRESSION: Sigmoid diverticulosis and active diverticulitis with evidence of bowel perforation.  Free air scattered throughout the abdomen and pelvis, most pronounced adjacent to the liver in the upper abdomen.  These results were called by telephone on 12/23/2011  at  1/55 the to  [facility], who verbally acknowledged these results.  Original Report Authenticated By: Cyndie Chime, M.D.     Perforated Diverticulitis Urinary Retention    MDM  Patient here with acute onset of lower abdominal pain with nausea and vomiting - intially placed foley catheter after which we obtained about 1000 cc dark yellow urine, the patient reports moderate relief from this, continues with ttp to bilateral  lower quads and will get CT scan which ended up showing perforated diverticulitis - placed on cipro and flagyl IV and called General Surgery who will see the patient here in the ED.        Izola Price [place], [place] 12/23/11 1413

## 2011-12-24 DIAGNOSIS — K5720 Diverticulitis of large intestine with perforation and abscess without bleeding: Secondary | ICD-10-CM

## 2011-12-24 HISTORY — DX: Diverticulitis of large intestine with perforation and abscess without bleeding: K57.20

## 2011-12-24 LAB — GLUCOSE, CAPILLARY
Glucose-Capillary: 127 mg/dL — ABNORMAL HIGH (ref 70–99)
Glucose-Capillary: 125 mg/dL — ABNORMAL HIGH (ref 70–99)

## 2011-12-24 LAB — BASIC METABOLIC PANEL
BUN: 12 mg/dL (ref 6–23)
CO2: 27 mEq/L (ref 19–32)
Chloride: 103 mEq/L (ref 96–112)
Creatinine, Ser: 0.91 mg/dL (ref 0.50–1.35)

## 2011-12-24 LAB — CBC
HCT: 35.2 % — ABNORMAL LOW (ref 39.0–52.0)
MCHC: 35.5 g/dL (ref 30.0–36.0)
MCV: 89.3 fL (ref 78.0–100.0)
Platelets: 149 10*3/uL — ABNORMAL LOW (ref 150–400)
RDW: 13.2 % (ref 11.5–15.5)
WBC: 16.2 10*3/uL — ABNORMAL HIGH (ref 4.0–10.5)

## 2011-12-24 NOTE — Progress Notes (Signed)
2000-Pt refused CPAP for the night, RT to monitor and assess as needed

## 2011-12-24 NOTE — Progress Notes (Signed)
Patient refused both vaccines 

## 2011-12-24 NOTE — Progress Notes (Signed)
1 Day Post-Op  Subjective: Complains of abdominal pain.    Objective: Vital signs in last 24 hours: Temp:  [98.2 F (36.8 C)-100.6 F (38.1 C)] 98.3 F (36.8 C) (03/16 0750) Pulse Rate:  [89-106] 89  (03/16 0600) Resp:  [11-24] 15  (03/16 0842) BP: (111-156)/(59-76) 122/62 mmHg (03/16 0600) SpO2:  [94 %-100 %] 96 % (03/16 0842) Weight:  [241 lb 13.5 oz (109.7 kg)] 241 lb 13.5 oz (109.7 kg) (03/16 0309)    Intake/Output from previous day: 03/15 0701 - 03/16 0700 In: 5525 [I.V.:5385; NG/GT:90; IV Piggyback:50] Out: 2530 [Urine:1850; Emesis/NG output:475; Drains:105; Blood:100] Intake/Output this shift:    General appearance: alert, cooperative and no distress Resp: nonlabored Cardio: normal rate, regular rhythm GI: soft, appropriate tenderness, ostomy looks healthy, no output, dark NG output  Lab Results:   [facility] 12/24/11 0347 12/23/11 1035  WBC 16.2* 12.9*  HGB 12.5* 15.0  HCT 35.2* 41.4  PLT 149* 165   BMET  Basename 12/24/11 0347 12/23/11 1035  NA 137 135  K 3.8 3.7  CL 103 98  CO2 27 28  GLUCOSE 130* 143*  BUN 12 14  CREATININE 0.91 0.99  CALCIUM 8.5 9.8   PT/INR  Basename 12/23/11 1445  LABPROT 15.1  INR 1.17   ABG No results found for this basename: PHART:2,PCO2:2,PO2:2,HCO3:2 in the last 72 hours  Studies/Results: Ct Abdomen Pelvis W Contrast  12/23/2011  *RADIOLOGY REPORT*  Clinical Data: Abdominal pain, nausea.  CT ABDOMEN AND PELVIS WITH CONTRAST  Technique:  Multidetector CT imaging of the abdomen and pelvis was performed following the standard protocol during bolus administration of intravenous contrast.  Contrast:  11/05/2009  Comparison: None.  Findings: There is free intraperitoneal air noted.  The probable source is the sigmoid colon.  Diverticulosis changes with surrounding inflammatory stranding compatible with active diverticulitis.  Secondary reactive wall thickening within the adjacent small bowel loops.  No other focal bowel  abnormality noted.  Heart is normal size.  Lung bases are clear.  Liver, spleen, pancreas, adrenals and kidneys are unremarkable. Mild aneurysmal dilatation of the infrarenal abdominal aorta, 3.1 cm maximally.  Foley catheter is in place with the bladder decompressed.  IMPRESSION: Sigmoid diverticulosis and active diverticulitis with evidence of bowel perforation.  Free air scattered throughout the abdomen and pelvis, most pronounced adjacent to the liver in the upper abdomen.  These results were called by telephone on 12/23/2011  at  1/55 the to  [facility], who verbally acknowledged these results.  Original Report Authenticated By: Cyndie Chime, M.D.   Dg Chest Port 1 View  12/23/2011  *RADIOLOGY REPORT*  Clinical Data: Preop  PORTABLE CHEST - 1 VIEW  Comparison: None.  Findings: Cardiomediastinal silhouette is unremarkable.  No acute infiltrate or pleural effusion.  No pulmonary edema.  Bony thorax is unremarkable.  IMPRESSION: No active disease.  Original Report Authenticated By: Natasha Mead, M.D.    Anti-infectives: Anti-infectives     Start     Dose/Rate Route Frequency Ordered Stop   12/24/11 0000   ciprofloxacin (CIPRO) IVPB 400 mg  Status:  Discontinued        400 mg 200 mL/hr over 60 Minutes Intravenous Every 12 hours 12/23/11 1508 12/23/11 1851   12/23/11 2200   metroNIDAZOLE (FLAGYL) IVPB 500 mg  Status:  Discontinued        500 mg 100 mL/hr over 60 Minutes Intravenous Every 8 hours 12/23/11 1508 12/23/11 1851   12/23/11 1900   ertapenem (INVANZ) 1 g in  sodium chloride 0.9 % 50 mL IVPB        1 g 100 mL/hr over 30 Minutes Intravenous Every 24 hours 12/23/11 1851     12/23/11 1400   ciprofloxacin (CIPRO) IVPB 400 mg        400 mg 200 mL/hr over 60 Minutes Intravenous  Once 12/23/11 1357 12/23/11 1603   12/23/11 1400   metroNIDAZOLE (FLAGYL) IVPB 500 mg        500 mg 100 mL/hr over 60 Minutes Intravenous  Once 12/23/11 1357 12/23/11 1507          Assessment/Plan: s/p  Procedure(s) (LRB): EXPLORATORY LAPAROTOMY (N/A) APPENDECTOMY (N/A) COLON RESECTION SIGMOID (N/A) pain control and continued NG tube.  Awaiting bowel function.  may be able to remove foley but his mobility is limited due to pain and tubes that will plan on leaving today and hope for removal tomorrow.  Mobilize. He may be stable for transfer to the floor if the bed is needed.  LOS: 1 day    Nicholas [name] DAVID 12/24/2011

## 2011-12-24 NOTE — ED Provider Notes (Signed)
Medical screening examination/treatment/procedure(s) were conducted as a shared visit with non-physician practitioner(s) and myself.  I personally evaluated the patient during the encounter.  71 year old male with diffuse abdominal pain and inability to void. Gradual onset yesterday and progressively worsening. Foley catheter was placed with return of over a liter of fluid. Patient's pain persisted though despite catheter placement. Abdomen is diffusely tender and distended. CT scan was ordered which subsequently showed perforated diverticulitis. Abx ordered. Surgery was consulted and evaluated the patient in emergency room and subsequent OR.  CRITICAL CARE Performed by: Raeford Razor   Total critical care time: 35 minutes  Critical care time was exclusive of separately billable procedures and treating other patients.  Critical care was necessary to treat or prevent imminent or life-threatening deterioration.  Critical care was time spent personally by me on the following activities: development of treatment plan with patient and/or surrogate as well as nursing, discussions with consultants, evaluation of patient's response to treatment, examination of patient, obtaining history from patient or surrogate, ordering and performing treatments and interventions, ordering and review of laboratory studies, ordering and review of radiographic studies, pulse oximetry and re-evaluation of patient's condition.    Raeford Razor, MD 12/24/11 [phone]

## 2011-12-25 LAB — URINE CULTURE
Colony Count: NO GROWTH
Culture: NO GROWTH

## 2011-12-25 LAB — DIFFERENTIAL
Eosinophils Absolute: 0 10*3/uL (ref 0.0–0.7)
Eosinophils Relative: 0 % (ref 0–5)
Lymphs Abs: 0.7 10*3/uL (ref 0.7–4.0)
Monocytes Absolute: 0.9 10*3/uL (ref 0.1–1.0)
Monocytes Relative: 9 % (ref 3–12)

## 2011-12-25 LAB — CBC
HCT: 34.4 % — ABNORMAL LOW (ref 39.0–52.0)
Hemoglobin: 11.6 g/dL — ABNORMAL LOW (ref 13.0–17.0)
MCH: 30.8 pg (ref 26.0–34.0)
MCV: 91.2 fL (ref 78.0–100.0)
Platelets: 155 10*3/uL (ref 150–400)
RBC: 3.77 MIL/uL — ABNORMAL LOW (ref 4.22–5.81)

## 2011-12-25 LAB — BASIC METABOLIC PANEL
CO2: 31 mEq/L (ref 19–32)
Calcium: 8.6 mg/dL (ref 8.4–10.5)
Chloride: 101 mEq/L (ref 96–112)
Glucose, Bld: 117 mg/dL — ABNORMAL HIGH (ref 70–99)
Sodium: 138 mEq/L (ref 135–145)

## 2011-12-25 LAB — GLUCOSE, CAPILLARY
Glucose-Capillary: 101 mg/dL — ABNORMAL HIGH (ref 70–99)
Glucose-Capillary: 98 mg/dL (ref 70–99)

## 2011-12-25 MED ORDER — VITAMINS A & D EX OINT
TOPICAL_OINTMENT | CUTANEOUS | Status: AC
  Administered 2011-12-25: 11:00:00
  Filled 2011-12-25: qty 5

## 2011-12-25 MED ORDER — ACETAMINOPHEN 650 MG RE SUPP
650.0000 mg | Freq: Four times a day (QID) | RECTAL | Status: DC | PRN
  Filled 2011-12-25: qty 1

## 2011-12-25 MED ORDER — BLISTEX EX OINT
TOPICAL_OINTMENT | CUTANEOUS | Status: AC
  Administered 2011-12-25: 18:00:00
  Filled 2011-12-25: qty 10

## 2011-12-25 MED ORDER — ACETAMINOPHEN 80 MG RE SUPP: 80.0000 mg | Freq: Four times a day (QID) | RECTAL | Status: DC | PRN

## 2011-12-25 MED ORDER — PHENOL 1.4 % MT LIQD
1.0000 | OROMUCOSAL | Status: DC | PRN
  Filled 2011-12-25: qty 177

## 2011-12-25 NOTE — Progress Notes (Signed)
Spoke with pt about wearing cpap tonight. He continues to refuse due to NG tube in his nose. Advised to call if he changed his mind.  Jacqulynn Cadet RRT

## 2011-12-25 NOTE — Progress Notes (Signed)
2 Days Post-Op  Subjective: Stable and alert. Getting up to chair, but states this is difficult due to abdominal pain. Still has NG tube with low to moderate bilious drainage. No colostomy output yet. Still has Foley. No shortness of breath.  Objective: Vital signs in last 24 hours: Temp:  [97.9 F (36.6 C)-99.2 F (37.3 C)] 98.3 F (36.8 C) (03/17 0425) Pulse Rate:  [82-95] 89  (03/17 0425) Resp:  [8-18] 16  (03/17 0805) BP: (122-146)/(64-82) 134/71 mmHg (03/17 0425) SpO2:  [93 %-96 %] 95 % (03/17 0805) Weight:  [245 lb (111.131 kg)] 245 lb (111.131 kg) (03/16 2230) Last BM Date: 12/22/11  Intake/Output from previous day: 03/16 0701 - 03/17 0700 In: 3093.1 [I.V.:3093.1] Out: 2885 [Urine:2340; Emesis/NG output:500; Drains:45] Intake/Output this shift:    General appearance:  alert. Cooperative. Mild distress from incisional pain. Color good. Resp: breath sounds clear bilaterally anteriorly. Decreased breath sounds at bases. No wheeze or rhonchi. GI: abdomen is generally soft but diffusely tender. Bowel sounds are absent. Midline wound is packed open no bleeding or unusual drainage. Colostomy stoma is pink and healthy but no output or flatus yet. JP serosanguineous  Lab Results:  Results for orders placed during the hospital encounter of 12/23/11 (from the past 24 hour(s))  GLUCOSE, CAPILLARY     Status: Abnormal   Collection Time   12/24/11 12:08 PM      Component Value Range   Glucose-Capillary 115 (*) 70 - 99 (mg/dL)   Comment 1 Documented in Chart     Comment 2 Notify RN    GLUCOSE, CAPILLARY     Status: Abnormal   Collection Time   12/24/11  3:52 PM      Component Value Range   Glucose-Capillary 126 (*) 70 - 99 (mg/dL)   Comment 1 Documented in Chart     Comment 2 Notify RN    GLUCOSE, CAPILLARY     Status: Abnormal   Collection Time   12/24/11  7:38 PM      Component Value Range   Glucose-Capillary 125 (*) 70 - 99 (mg/dL)   Comment 1 Documented in Chart     Comment  2 Notify RN    GLUCOSE, CAPILLARY     Status: Abnormal   Collection Time   12/25/11 12:04 AM      Component Value Range   Glucose-Capillary 101 (*) 70 - 99 (mg/dL)  GLUCOSE, CAPILLARY     Status: Abnormal   Collection Time   12/25/11  4:24 AM      Component Value Range   Glucose-Capillary 125 (*) 70 - 99 (mg/dL)  BASIC METABOLIC PANEL     Status: Abnormal   Collection Time   12/25/11  5:45 AM      Component Value Range   Sodium 138  135 - 145 (mEq/L)   Potassium 3.8  3.5 - 5.1 (mEq/L)   Chloride 101  96 - 112 (mEq/L)   CO2 31  19 - 32 (mEq/L)   Glucose, Bld 117 (*) 70 - 99 (mg/dL)   BUN 12  6 - 23 (mg/dL)   Creatinine, Ser 1.19  0.50 - 1.35 (mg/dL)   Calcium 8.6  8.4 - 14.7 (mg/dL)   GFR calc non Af Amer 85 (*) >90 (mL/min)   GFR calc Af Amer >90  >90 (mL/min)  CBC     Status: Abnormal   Collection Time   12/25/11  5:45 AM      Component Value Range   WBC  10.0  4.0 - 10.5 (K/uL)   RBC 3.77 (*) 4.22 - 5.81 (MIL/uL)   Hemoglobin 11.6 (*) 13.0 - 17.0 (g/dL)   HCT 45.4 (*) 09.8 - 52.0 (%)   MCV 91.2  78.0 - 100.0 (fL)   MCH 30.8  26.0 - 34.0 (pg)   MCHC 33.7  30.0 - 36.0 (g/dL)   RDW 11.9  14.7 - 82.9 (%)   Platelets 155  150 - 400 (K/uL)  DIFFERENTIAL     Status: Abnormal   Collection Time   12/25/11  5:45 AM      Component Value Range   Neutrophils Relative 84 (*) 43 - 77 (%)   Neutro Abs 8.4 (*) 1.7 - 7.7 (K/uL)   Lymphocytes Relative 7 (*) 12 - 46 (%)   Lymphs Abs 0.7  0.7 - 4.0 (K/uL)   Monocytes Relative 9  3 - 12 (%)   Monocytes Absolute 0.9  0.1 - 1.0 (K/uL)   Eosinophils Relative 0  0 - 5 (%)   Eosinophils Absolute 0.0  0.0 - 0.7 (K/uL)   Basophils Relative 0  0 - 1 (%)   Basophils Absolute 0.0  0.0 - 0.1 (K/uL)  GLUCOSE, CAPILLARY     Status: Abnormal   Collection Time   12/25/11  7:48 AM      Component Value Range   Glucose-Capillary 109 (*) 70 - 99 (mg/dL)     Studies/Results: @[id number] @     . ertapenem  1 g Intravenous Q24H  . heparin subcutaneous   5,000 Units Subcutaneous Q8H  . insulin aspart  0-15 Units Subcutaneous Q4H  . morphine   Intravenous Q4H  . pantoprazole (PROTONIX) IV  40 mg Intravenous Q24H     Assessment/Plan: s/p Procedure(s): EXPLORATORY LAPAROTOMY APPENDECTOMY COLON RESECTION SIGMOID  Patient is transferred to the floor. Will try to mobilize out of bed more.  Discontinue Foley when voiding trial today. Continue antibiotics. Begin twice a day wound care and packing.      LOS: 2 days    Nicholas [name] M 12/25/2011  . .prob

## 2011-12-26 LAB — GLUCOSE, CAPILLARY: Glucose-Capillary: 82 mg/dL (ref 70–99)

## 2011-12-26 LAB — CBC
MCHC: 34.2 g/dL (ref 30.0–36.0)
Platelets: 169 10*3/uL (ref 150–400)
RDW: 13.1 % (ref 11.5–15.5)
WBC: 7.3 10*3/uL (ref 4.0–10.5)

## 2011-12-26 LAB — BASIC METABOLIC PANEL
BUN: 13 mg/dL (ref 6–23)
Chloride: 102 mEq/L (ref 96–112)
GFR calc Af Amer: >90 mL/min (ref >90)
GFR calc non Af Amer: 88 mL/min — ABNORMAL LOW (ref >90)
Potassium: 3.5 mEq/L (ref 3.5–5.1)
Sodium: 138 mEq/L (ref 135–145)

## 2011-12-26 MED ORDER — ACETAMINOPHEN 10 MG/ML IV SOLN
1000.0000 mg | Freq: Four times a day (QID) | INTRAVENOUS | Status: AC
  Administered 2011-12-26 – 2011-12-27 (×4): 1000 mg via INTRAVENOUS
  Filled 2011-12-26 (×4): qty 100

## 2011-12-26 NOTE — Progress Notes (Signed)
Occupational Therapy Note Chart reviewed. Pt with NG tube and nsg states he can be disconnected from it for therapy. Nsg with another patient. Will check back as able to coordinate with nsg for this. Judithann Sauger OTR/L [phone] 12/26/2011

## 2011-12-26 NOTE — Progress Notes (Signed)
3 Days Post-Op  Subjective: Afebrile, VSS, K+3.5 other labs OK, Not moving much it hurts, Foley replace yesterday for urinary retention. He has hx of chronic pain from hand and arm surg. Only 325 from NG yesterday.  Objective: Vital signs in last 24 hours: Temp:  [98.2 F (36.8 C)-99.4 F (37.4 C)] 98.9 F (37.2 C) (03/18 0412) Pulse Rate:  [70-87] 70  (03/18 0412) Resp:  [14-18] 16  (03/18 0429) BP: (117-158)/(69-84) 117/69 mmHg (03/18 0412) SpO2:  [95 %-98 %] 97 % (03/18 0429) Last BM Date: 12/22/11  Intake/Output from previous day: 03/17 0701 - 03/18 0700 In: 3201.8 [I.V.:3111.8; NG/GT:30; IV Piggyback:60] Out: 2085 [Urine:1700; Emesis/NG output:325; Drains:60] Intake/Output this shift:    General appearance: alert, cooperative and moderately obese Resp: clear to auscultation bilaterally and decreased sounds at the Bases GI: soft, not distended, rare bowel sounds, Incision looks good, wet to dry dressing.  Colostomy some bloody to clear fluid, no gas currently  Lab Results:   [facility] 12/26/11 0524 12/25/11 0545  WBC 7.3 10.0  HGB 11.7* 11.6*  HCT 34.2* 34.4*  PLT 169 155    Lab 12/23/11 1035  AST 17  ALT 19  ALKPHOS 57  BILITOT 1.1  PROT 7.4  ALBUMIN 3.8    BMET  Basename 12/26/11 0524 12/25/11 0545  NA 138 138  K 3.5 3.8  CL 102 101  CO2 31 31  GLUCOSE 94 117*  BUN 13 12  CREATININE 0.82 0.90  CALCIUM 8.6 8.6   PT/INR  Basename 12/23/11 1445  LABPROT 15.1  INR 1.17     Studies/Results: No results found.  Anti-infectives: Anti-infectives     Start     Dose/Rate Route Frequency Ordered Stop   12/24/11 0000   ciprofloxacin (CIPRO) IVPB 400 mg  Status:  Discontinued        400 mg 200 mL/hr over 60 Minutes Intravenous Every 12 hours 12/23/11 1508 12/23/11 1851   12/23/11 2200   metroNIDAZOLE (FLAGYL) IVPB 500 mg  Status:  Discontinued        500 mg 100 mL/hr over 60 Minutes Intravenous Every 8 hours 12/23/11 1508 12/23/11 1851   12/23/11  1900   ertapenem (INVANZ) 1 g in sodium chloride 0.9 % 50 mL IVPB        1 g 100 mL/hr over 30 Minutes Intravenous Every 24 hours 12/23/11 1851     12/23/11 1400   ciprofloxacin (CIPRO) IVPB 400 mg        400 mg 200 mL/hr over 60 Minutes Intravenous  Once 12/23/11 1357 12/23/11 1603   12/23/11 1400   metroNIDAZOLE (FLAGYL) IVPB 500 mg        500 mg 100 mL/hr over 60 Minutes Intravenous  Once 12/23/11 1357 12/23/11 1507         Current Facility-Administered Medications  Medication Dose Route Frequency Provider Last Rate Last Dose  . acetaminophen (TYLENOL) suppository 650 mg  650 mg Rectal Q6H PRN Lodema Pilot, DO      . diphenhydrAMINE (BENADRYL) injection 12.5 mg  12.5 mg Intravenous Q6H PRN Adolph Pollack, MD       Or  . diphenhydrAMINE (BENADRYL) 12.5 MG/5ML elixir 12.5 mg  12.5 mg Oral Q6H PRN Adolph Pollack, MD      . ertapenem [facility]) 1 g in sodium chloride 0.9 % 50 mL IVPB  1 g Intravenous Q24H Adolph Pollack, MD   1 g at 12/25/11 1804  . heparin injection 5,000 Units  5,000  Units Subcutaneous Q8H Adolph Pollack, MD   5,000 Units at 12/26/11 0532  . insulin aspart (novoLOG) injection 0-15 Units  0-15 Units Subcutaneous Q4H Adolph Pollack, MD   2 Units at 12/25/11 0435  . lactated ringers infusion   Intravenous Continuous Lodema Pilot, DO 130 mL/hr at 12/26/11 0532 1,000 mL at 12/26/11 0532  . lip balm (BLISTEX) ointment           . morphine 1 MG/ML PCA injection   Intravenous Q4H Adolph Pollack, MD   6 mg at 12/26/11 0429  . naloxone [facility]) injection 0.4 mg  0.4 mg Intravenous PRN Adolph Pollack, MD       And  . sodium chloride 0.9 % injection 9 mL  9 mL Intravenous PRN Adolph Pollack, MD      . ondansetron [facility]) injection 4 mg  4 mg Intravenous Q4H PRN Adolph Pollack, MD   4 mg at 12/24/11 1722  . ondansetron (ZOFRAN) injection 4 mg  4 mg Intravenous Q6H PRN Adolph Pollack, MD      . pantoprazole (PROTONIX) injection 40 mg  40 mg  Intravenous Q24H Adolph Pollack, MD   40 mg at 12/25/11 1804  . phenol (CHLORASEPTIC) mouth spray 1 spray  1 spray Mouth/Throat PRN Lodema Pilot, DO      . vitamin A & D ointment           . DISCONTD: acetaminophen (TYLENOL) suppository 80 mg  80 mg Rectal Q6H PRN Ernestene Mention, MD        Assessment/Plan Perforated sigmoid colon s/p Exploratory laparotomy, sigmoid colectomy, colostomy, incidental appendectomy 12/23/11 DR.Rosenbower  Diabetes mellitus on Metformin        .  OSA on CPAP / Wife will bring in tomorrow. He does not know settings    .  BPH (benign prostatic hypertrophy)    .  Neuropathy both lower legs and feet.  Chronic arm and hand pain after trauma  BMI 34.4  TOBACCO USE 40 YEARS 1-3PPD  Cardiac catheterization during sleep apnea reportedly showed nonobstructive disease.      Plan:  He has a CPAP machine, but cant use with NG, and O2 tubes in.  We need to move him and work on IS.  I will add some IV tylenol for pain, ask OT/PT and nursing to help get him moving some.  Leave foley in another day or so. He was on Flomax 0.8 daily  pre op   POD3   LOS: 3 days    JENNINGS,WILLARD 12/26/2011

## 2011-12-26 NOTE — Progress Notes (Signed)
RT at beside to discuss CPAP with pt.  Pt continues to refuse to wear CPAP at night due to NG Tube.  Pt encouraged to notify RN/ RT of any concerns.

## 2011-12-26 NOTE — H&P (Signed)
Patient seen and examined.  Agree with above note.

## 2011-12-26 NOTE — Consult Note (Signed)
WOC ostomy consult  Stoma type/location: LLQ Colostomy (Temporary) Stomal assessment/size: Visualized through pouch: approximately 1 and 3/4 inch round, pink, raised, moist.  No function yet.  Patient still has NGT. Peristomal assessment: Not visualized Treatment options for stomal/peristomal skin: None. Output: think small amount of bloody exudate. Ostomy pouching: 2pc.  Education provided: I introduced myself to patient and significant other, Myriam Jacobson. Both understand the role of the WOC Nurse in post op care.  Patient education booklet provided.  I will see tomorrow and perform pouch change, also begin teaching. I will follow with you.   Thanks, Ladona Mow, MSN, RN, [facility], CWOCN [phone])

## 2011-12-27 NOTE — Progress Notes (Signed)
Pt NG tube has been removed.  Pt still prefers to not wear CPAP at this time and stay on his nasal cannula.   Pt is tolerating his cannula well at this time.  Pt encouraged to notify RN/ RT of any concerns.

## 2011-12-27 NOTE — Progress Notes (Signed)
4 Days Post-Op  Subjective: Tm 99.4, VSS, only 50 cc recorded NG drainage. JP drain shows serous-bloody drainage,75 cc recorded. Objective: Vital signs in last 24 hours: Temp:  [98.3 F (36.8 C)-98.5 F (36.9 C)] 98.3 F (36.8 C) (03/19 0403) Pulse Rate:  [69-73] 73  (03/19 0403) Resp:  [15-16] 16  (03/19 0438) BP: (118-143)/(67-75) 143/71 mmHg (03/19 0403) SpO2:  [95 %-98 %] 96 % (03/19 0438) Last BM Date: 12/22/11  Intake/Output from previous day: 03/18 0701 - 03/19 0700 In: 3399.8 [I.V.:2949.8; IV Piggyback:450] Out: 2200 [Urine:2075; Emesis/NG output:50; Drains:75] Intake/Output this shift:    General appearance: alert, cooperative, no distress and mildly obese Resp: clear to auscultation bilaterally GI: soft, +BS, gas in ostomy bag, along with clear bloody looking fluid.  wound looks fine, wet to dry dressing.  Lab Results:   Basename 12/26/11 0524 12/25/11 0545  WBC 7.3 10.0  HGB 11.7* 11.6*  HCT 34.2* 34.4*  PLT 169 155    BMET  Basename 12/26/11 0524 12/25/11 0545  NA 138 138  K 3.5 3.8  CL 102 101  CO2 31 31  GLUCOSE 94 117*  BUN 13 12  CREATININE 0.82 0.90  CALCIUM 8.6 8.6   PT/INR No results found for this basename: LABPROT:2,INR:2 in the last 72 hours   Lab 12/23/11 1035  AST 17  ALT 19  ALKPHOS 57  BILITOT 1.1  PROT 7.4  ALBUMIN 3.8     Lipase     Component Value Date/Time   LIPASE 18 12/23/2011 1035     Studies/Results: No results found.  Medications:    . acetaminophen  1,000 mg Intravenous Q6H  . ertapenem  1 g Intravenous Q24H  . heparin subcutaneous  5,000 Units Subcutaneous Q8H  . insulin aspart  0-15 Units Subcutaneous Q4H  . morphine   Intravenous Q4H  . pantoprazole (PROTONIX) IV  40 mg Intravenous Q24H    Assessment/Plan:  POD4 Perforated sigmoid colon s/p Exploratory laparotomy, sigmoid colectomy, colostomy, incidental appendectomy 12/23/11 DR.Rosenbower   Diabetes mellitus on Metformin        .  OSA on CPAP /  Wife will bring in tomorrow. He does not know settings    .  BPH (benign prostatic hypertrophy)    .  Neuropathy both lower legs and feet.  Chronic arm and hand pain after trauma  BMI 34.4  TOBACCO USE 40 YEARS 1-3PPD  Cardiac catheterization during sleep apnea reportedly showed nonobstructive disease.      Plan:  Clamp NG, continue to mobilize, start sips and ice chips.  LOS: 4 days    Nicholas [name] 12/27/2011

## 2011-12-27 NOTE — Evaluation (Signed)
Physical Therapy Evaluation Patient Details Name: Nicholas [name] MRN: [id number] DOB: 10/19/1940 Today's Date: 12/27/2011  Problem List:  Patient Active Problem List  Diagnoses  . HYPERLIPIDEMIA  . SLEEP APNEA, OBSTRUCTIVE  . CHRONIC PAIN DUE TO TRAUMA  . OTHER ACUTE SINUSITIS  . DIABETES MELLITUS, BORDERLINE  . BENIGN PROSTATIC HYPERTROPHY, MILD, HX OF  . MORTON'S NEUROMA, RIGHT  . METATARSALGIA  . TIBIALIS TENDINITIS  . PES PLANUS  . HALLUX RIGIDUS, ACQUIRED  . OTHER CONGENITAL VALGUS DEFORMITY OF FEET  . PERIPHERAL NEUROPATHY  . Diabetic neuropathy  . Knee MCL sprain  . Knee pain  . Lower extremity edema  . Diverticulitis of colon with perforation    Past Medical History:  Past Medical History  Diagnosis Date  . Diabetes mellitus     boarderline  . OSA on CPAP   . BPH (benign prostatic hypertrophy)   . Neuropathy    Past Surgical History:  Past Surgical History  Procedure Date  . Cholecystectomy     PT Assessment/Plan/Recommendation PT Assessment Clinical Impression Statement: pt tolerated ambulation farther today, pt very motivated to walk.  pt can benefit from PT to improve mobility, strength to DC to iome. PT Recommendation/Assessment: Patient will need skilled PT in the acute care venue PT Problem List: Decreased activity tolerance;Decreased mobility;Pain PT Therapy Diagnosis : Difficulty walking;Acute pain PT Plan PT Frequency: Min 3X/week PT Treatment/Interventions: DME instruction;Gait training;Patient/family education;Functional mobility training PT Recommendation Recommendations for Other Services: OT consult Follow Up Recommendations: No PT follow up Equipment Recommended: None recommended by OT PT Goals  Acute Rehab PT Goals PT Goal Formulation: With patient/family Time For Goal Achievement: 7 days Pt will Roll Supine to Right Side: Independently PT Goal: Rolling Supine to Right Side - Progress: Goal set today Pt will Roll Supine to Left Side:  Independently PT Goal: Rolling Supine to Left Side - Progress: Goal set today Pt will go Supine/Side to Sit: with supervision;with HOB 0 degrees PT Goal: Supine/Side to Sit - Progress: Goal set today Pt will go Sit to Supine/Side: with supervision;with HOB 0 degrees PT Goal: Sit to Supine/Side - Progress: Goal set today Pt will go Sit to Stand: with supervision PT Goal: Sit to Stand - Progress: Goal set today Pt will go Stand to Sit: with supervision PT Goal: Stand to Sit - Progress: Goal set today Pt will Ambulate: >150 feet;with supervision;with least restrictive assistive device PT Goal: Ambulate - Progress: Goal set today  PT Evaluation Precautions/Restrictions  Precautions Precautions:  (drain, colostomy, abd. incision) Restrictions Weight Bearing Restrictions: No Prior Functioning  Home Living Lives With: Spouse Type of Home: House Home Layout: One level Home Access: Stairs to enter [employer] of Steps: 2 (small step then up to porch then 1 step inside) Bathroom Shower/Tub: Engineer, manufacturing systems: Handicapped height Home Adaptive Equipment: None Prior Function Level of Independence: Independent with basic ADLs;Independent with transfers;Independent with gait Driving: Yes Vocation: Full time employment Cognition Cognition Arousal/Alertness: Awake/alert Overall Cognitive Status: Appears within functional limits for tasks assessed Orientation Level: Oriented X4 Sensation/Coordination Sensation Light Touch: Appears Intact Extremity Assessment RUE Assessment RUE Assessment: Within Functional Limits LUE Assessment LUE Assessment: Within Functional Limits RLE Assessment RLE Assessment: Within Functional Limits LLE Assessment LLE Assessment: Within Functional Limits Mobility (including Balance) Bed Mobility Bed Mobility: Yes Supine to Sit: 4: Min assist;HOB elevated (Comment degrees) Supine to Sit Details (indicate cue type and reason): min  verbal cues Transfers Transfers: Yes Sit to Stand: 4: Min assist;With upper  extremity assist;From bed Sit to Stand Details (indicate cue type and reason): min verbal cues Stand to Sit: 4: Min assist;With upper extremity assist;To chair/3-in-1 Stand to Sit Details: min verbal cues for hand placement  Ambulation/Gait Ambulation/Gait: Yes Ambulation/Gait Assistance: 1: +2 Total assist (+1 for equipment/O2/pt=80) Ambulation/Gait Assistance Details (indicate cue type and reason): pt did stop several times to stand and reast. Ambulation Distance (Feet): 180 Feet Assistive device: Rolling walker Gait Pattern: Step-through pattern Gait velocity: slow  Posture/Postural Control Posture/Postural Control: No significant limitations Exercise    End of Session PT - End of Session Activity Tolerance: Patient tolerated treatment well Patient left: in chair;with call bell in reach;with family/visitor present Nurse Communication: Mobility status for transfers General Behavior During Session: [facility] for tasks performed Cognition: [facility] for tasks performed  Rada Hay 12/27/2011, 12:05 PM  [phone]

## 2011-12-27 NOTE — Evaluation (Addendum)
Occupational Therapy Evaluation Patient Details Name: Nicholas [name] MRN: [id number] DOB: 1941-01-29 Today's Date: 12/27/2011  Problem List:  Patient Active Problem List  Diagnoses  . HYPERLIPIDEMIA  . SLEEP APNEA, OBSTRUCTIVE  . CHRONIC PAIN DUE TO TRAUMA  . OTHER ACUTE SINUSITIS  . DIABETES MELLITUS, BORDERLINE  . BENIGN PROSTATIC HYPERTROPHY, MILD, HX OF  . MORTON'S NEUROMA, RIGHT  . METATARSALGIA  . TIBIALIS TENDINITIS  . PES PLANUS  . HALLUX RIGIDUS, ACQUIRED  . OTHER CONGENITAL VALGUS DEFORMITY OF FEET  . PERIPHERAL NEUROPATHY  . Diabetic neuropathy  . Knee MCL sprain  . Knee pain  . Lower extremity edema  . Diverticulitis of colon with perforation    Past Medical History:  Past Medical History  Diagnosis Date  . Diabetes mellitus     boarderline  . OSA on CPAP   . BPH (benign prostatic hypertrophy)   . Neuropathy    Past Surgical History:  Past Surgical History  Procedure Date  . Cholecystectomy     OT Assessment/Plan/Recommendation OT Assessment Clinical Impression Statement: Pt presents with s/p colostomy and is currently limited by pain. Will benefit from skilled OT services to increase his independence for discharge home with family.  OT Recommendation/Assessment: Patient will need skilled OT in the acute care venue OT Problem List: Decreased strength;Pain OT Therapy Diagnosis : Generalized weakness;Acute pain OT Plan OT Frequency: Min 2X/week OT Treatment/Interventions: Self-care/ADL training;Therapeutic activities;DME and/or AE instruction;Patient/family education OT Recommendation Follow Up Recommendations: No OT follow up NO DME recommended for OT Individuals Consulted Consulted and Agree with Results and Recommendations: Patient;Family member/caregiver Family Member Consulted: wife OT Goals Acute Rehab OT Goals OT Goal Formulation: With patient/family Time For Goal Achievement: 7 days ADL Goals Pt Will Perform Grooming: with  supervision;Standing at sink;Other (comment) (for 3 tasks to build activity tolerance) ADL Goal: Grooming - Progress: Goal set today Pt Will Perform Upper Body Bathing: with supervision;Other (comment) (own setup with AD PRN) ADL Goal: Upper Body Bathing - Progress: Goal set today Pt Will Perform Lower Body Bathing: with supervision;with adaptive equipment;Sit to stand from chair;Sit to stand from bed;Other (comment) (if pt decides to obtain AE versus wife assist) ADL Goal: Lower Body Bathing - Progress: Goal set today Pt Will Perform Upper Body Dressing: with supervision;Other (comment) (for own setup with AD) ADL Goal: Upper Body Dressing - Progress: Goal set today Pt Will Perform Lower Body Dressing: with supervision;with adaptive equipment;Sit to stand from chair;Sit to stand from bed;Other (comment) (if pt decides to obtain AE) ADL Goal: Lower Body Dressing - Progress: Goal set today Pt Will Transfer to Toilet: with supervision;Other (comment) (stand for urinal/toilet vs sit on comfort height) ADL Goal: Toilet Transfer - Progress: Goal set today Pt Will Perform Toileting - Clothing Manipulation: with supervision;Standing ADL Goal: Toileting - Clothing Manipulation - Progress: Goal set today  OT Evaluation Precautions/Restrictions  Restrictions Weight Bearing Restrictions: No Prior Functioning Home Living Lives With: Spouse Type of Home: House Home Layout: One level Home Access: Stairs to enter [employer] of Steps: 2 (small step then up to porch then 1 step inside) Bathroom Shower/Tub: Engineer, manufacturing systems: Handicapped height Home Adaptive Equipment: None Prior Function Level of Independence: Independent with basic ADLs;Independent with transfers;Independent with gait Driving: Yes Vocation: Full time employment ADL ADL Eating/Feeding: Other (comment) (ice chips only) Grooming: Simulated;Wash/dry face;Set up Where Assessed - Grooming: Supine, head of bed  up Upper Body Bathing: Simulated;Chest;Right arm;Left arm;Abdomen;Moderate assistance Upper Body Bathing Details (indicate cue type and  reason): to manage lines/tubes Where Assessed - Upper Body Bathing: Sitting, bed;Unsupported Lower Body Bathing: Simulated;Moderate assistance;Other (comment) Lower Body Bathing Details (indicate cue type and reason): pt states he couldnt cross legs up PTA. too painful to reach forward. Educated on AE options for LB ADL and pt to decide on AE versus have assist.  Where Assessed - Lower Body Bathing: Sit to stand from bed Upper Body Dressing: Simulated;Moderate assistance Upper Body Dressing Details (indicate cue type and reason): to manage lines/tubes for gown Where Assessed - Upper Body Dressing: Standing Lower Body Dressing: Simulated;Maximal assistance Lower Body Dressing Details (indicate cue type and reason): too painful to manage starting LB clothing right now Where Assessed - Lower Body Dressing: Sit to stand from bed Toilet Transfer: Simulated;Other (comment);Minimal assistance (simulate standing for urinal with min assist. has catheter) Toileting - Clothing Manipulation: Simulated;Minimal assistance Where Assessed - Toileting Clothing Manipulation: Standing Toileting - Hygiene: Not assessed Tub/Shower Transfer: Not assessed Tub/Shower Transfer Method: Not assessed Equipment Used: Rolling walker ADL Comments: Educated pt and wife on AE options. Pt states he may just have his wife assist but wife interested in AE options. Pt states he will think about it and decide. Pt states it feels good to walk.  Vision/Perception    Cognition Cognition Arousal/Alertness: Awake/alert Overall Cognitive Status: Appears within functional limits for tasks assessed Orientation Level: Oriented X4 Sensation/Coordination Sensation Light Touch: Appears Intact Extremity Assessment RUE Assessment RUE Assessment: Within Functional Limits LUE Assessment LUE  Assessment: Within Functional Limits Mobility  Bed Mobility Bed Mobility: Yes Supine to Sit: 4: Min assist;HOB elevated (Comment degrees) Supine to Sit Details (indicate cue type and reason): min verbal cues Transfers Sit to Stand: 4: Min assist;With upper extremity assist;From bed Sit to Stand Details (indicate cue type and reason): min verbal cues Stand to Sit: 4: Min assist;With upper extremity assist;To chair/3-in-1 Stand to Sit Details: min verbal cues for hand placement  Exercises   End of Session OT - End of Session Activity Tolerance: Patient tolerated treatment well Patient left: in chair;with call bell in reach;with family/visitor present General Behavior During Session: [facility] for tasks performed Cognition: [facility] for tasks performed  cotx with PT  Lennox Laity [phone] 12/27/2011, 11:16 AM

## 2011-12-28 LAB — GLUCOSE, CAPILLARY
Glucose-Capillary: 104 mg/dL — ABNORMAL HIGH (ref 70–99)
Glucose-Capillary: 104 mg/dL — ABNORMAL HIGH (ref 70–99)
Glucose-Capillary: 111 mg/dL — ABNORMAL HIGH (ref 70–99)
Glucose-Capillary: 87 mg/dL (ref 70–99)
Glucose-Capillary: 90 mg/dL (ref 70–99)
Glucose-Capillary: 93 mg/dL (ref 70–99)
Glucose-Capillary: 96 mg/dL (ref 70–99)
Glucose-Capillary: 99 mg/dL (ref 70–99)
Glucose-Capillary: 94 mg/dL (ref 70–99)
Glucose-Capillary: 102 mg/dL — ABNORMAL HIGH (ref 70–99)

## 2011-12-28 NOTE — Consult Note (Signed)
WOC ostomy consult  Stoma type/location: LLQ, end colostomy Stomal assessment/size:1 5/8" x 2 " oval stoma, budded from the skin, pink and moist Peristomal assessment: intact without problems Treatment options for stomal/peristomal skin: none needed Output flatus, scant brown liq Ostomy pouching: 2pc 2 3/4" pouch utilized today, due to stoma size. Will order supplies to bedside  Education provided: Basic ostomy and peristomal skin care, demonstrated measurement of stoma and cutting of wafer to pt and his wife.  Teaching booklet in the room, wife has started to review this.  Discussed diet, activity. Emotional support provided for body image changes. Explained ostomy surgery and creation of stoma. Encouraged pt and wife to write down question should they have them before next scheduled visit with ostomy nurse.  Would recommend [facility] for further ostomy education reinforcement. Began explanation of types of pouching. Will enroll in [place] [employer] dc program and send samples to pts home.  WOC team will follow Arielys [place] Marlena Clipper, [place] [phone]

## 2011-12-28 NOTE — Progress Notes (Signed)
Pt continues to refuse CPAP, will assist as needed.

## 2011-12-28 NOTE — Progress Notes (Signed)
5 Days Post-Op  Subjective: Still prettty sore, slow to move in bed.  Allot of gas in colostomy bag, no stool same clear bloody fluid as before.  Some nausea yesterday with clears. Afebrile, VSS, Bp creeping up some, only 50 recorded from NG Objective: Vital signs in last 24 hours: Temp:  [98.7 F (37.1 C)-98.9 F (37.2 C)] 98.7 F (37.1 C) (03/20 0549) Pulse Rate:  [69-84] 73  (03/20 0549) Resp:  [13-21] 13  (03/20 0549) BP: (144-159)/(77-81) 144/77 mmHg (03/20 0549) SpO2:  [96 %-99 %] 96 % (03/20 0549) Last BM Date: 12/22/11  Intake/Output from previous day: 03/19 0701 - 03/20 0700 In: 3047 [P.O.:118; I.V.:2879; IV Piggyback:50] Out: 3630 [Urine:3400; Emesis/NG output:45; Drains:185] Intake/Output this shift:    General appearance: alert, cooperative and no distress Resp: clear to auscultation bilaterally ABD:  Still tender and sore, very slow to move on PCA.  +BS, wound looks good Lab Results:   Basename 12/26/11 0524  WBC 7.3  HGB 11.7*  HCT 34.2*  PLT 169    BMET  Basename 12/26/11 0524  NA 138  K 3.5  CL 102  CO2 31  GLUCOSE 94  BUN 13  CREATININE 0.82  CALCIUM 8.6   PT/INR No results found for this basename: LABPROT:2,INR:2 in the last 72 hours   Lab 12/23/11 1035  AST 17  ALT 19  ALKPHOS 57  BILITOT 1.1  PROT 7.4  ALBUMIN 3.8     Lipase     Component Value Date/Time   LIPASE 18 12/23/2011 1035     Studies/Results: No results found.  Medications:    . ertapenem  1 g Intravenous Q24H  . heparin subcutaneous  5,000 Units Subcutaneous Q8H  . insulin aspart  0-15 Units Subcutaneous Q4H  . morphine   Intravenous Q4H  . pantoprazole (PROTONIX) IV  40 mg Intravenous Q24H    Assessment/Plan Perforated sigmoid colon s/p Exploratory laparotomy, sigmoid colectomy, colostomy, incidental appendectomy 12/23/11 DR.Rosenbower   Diabetes mellitus on Metformin        .  OSA on CPAP / Wife will bring in tomorrow. He does not know settings    .   BPH (benign prostatic hypertrophy)    .  Neuropathy both lower legs and feet.  Chronic arm and hand pain after trauma  BMI 34.4  TOBACCO USE 40 YEARS 1-3PPD  Cardiac catheterization during sleep apnea reportedly showed nonobstructive disease.      Plan: Continue clear liquids and mobilize.  Increase diet after some stool coming thru colostomy. Recheck labs tomorrow.    LOS: 5 days    Rina Adney 12/28/2011

## 2011-12-29 LAB — GLUCOSE, CAPILLARY
Glucose-Capillary: 117 mg/dL — ABNORMAL HIGH (ref 70–99)
Glucose-Capillary: 121 mg/dL — ABNORMAL HIGH (ref 70–99)
Glucose-Capillary: 94 mg/dL (ref 70–99)
Glucose-Capillary: 94 mg/dL (ref 70–99)

## 2011-12-29 LAB — CBC
Hemoglobin: 12.9 g/dL — ABNORMAL LOW (ref 13.0–17.0)
MCH: 30.7 pg (ref 26.0–34.0)
MCHC: 34.8 g/dL (ref 30.0–36.0)
Platelets: 203 10*3/uL (ref 150–400)
RBC: 4.2 MIL/uL — ABNORMAL LOW (ref 4.22–5.81)

## 2011-12-29 LAB — COMPREHENSIVE METABOLIC PANEL
ALT: 92 U/L — ABNORMAL HIGH (ref 0–53)
AST: 93 U/L — ABNORMAL HIGH (ref 0–37)
Alkaline Phosphatase: 71 U/L (ref 39–117)
Calcium: 8.9 mg/dL (ref 8.4–10.5)
Glucose, Bld: 102 mg/dL — ABNORMAL HIGH (ref 70–99)
Potassium: 3.7 mEq/L (ref 3.5–5.1)
Sodium: 138 mEq/L (ref 135–145)
Total Protein: 6 g/dL (ref 6.0–8.3)

## 2011-12-29 NOTE — Progress Notes (Signed)
Patient ID: Nicholas [name], male   DOB: [birth year], 71 y.o.   MRN: [id number] Nicholas [name] 71 y.o.  Body mass index is 33.23 kg/(m^2).  Patient Active Problem List  Diagnoses  . HYPERLIPIDEMIA  . SLEEP APNEA, OBSTRUCTIVE  . CHRONIC PAIN DUE TO TRAUMA  . OTHER ACUTE SINUSITIS  . DIABETES MELLITUS, BORDERLINE  . BENIGN PROSTATIC HYPERTROPHY, MILD, HX OF  . MORTON'S NEUROMA, RIGHT  . METATARSALGIA  . TIBIALIS TENDINITIS  . PES PLANUS  . HALLUX RIGIDUS, ACQUIRED  . OTHER CONGENITAL VALGUS DEFORMITY OF FEET  . PERIPHERAL NEUROPATHY  . Diabetic neuropathy  . Knee MCL sprain  . Knee pain  . Lower extremity edema  . Diverticulitis of colon with perforation    Allergies  Allergen Reactions  . Sudafed (Pseudoephedrine Hcl)     "makes me feel drunk"    Past Surgical History  Procedure Date  . Cholecystectomy    Nicholas Mannan, MD, MD 1. Diverticulitis of colon with perforation     Mr. Nicholas [name] was transferred down to 5 west. He appears to be feeling better and his ostomy is working well. He is ready to advance to a full liquid diet and this has been done. Hopefully he will be ready for discharge in a couple of days. Nicholas B. Daphine Deutscher, MD, [facility] [phone] beeper [phone]  12/29/2011 8:29 AM

## 2011-12-29 NOTE — Progress Notes (Signed)
Physical Therapy Treatment Patient Details Name: Nicholas [name] MRN: [id number] DOB: Sep 11, 1941 Today's Date: 12/29/2011  PT Assessment/Plan  PT - Assessment/Plan Comments on Treatment Session: Pt progressing well with mobility.  PT Plan: Discharge plan remains appropriate Equipment Recommended: Rolling walker with 5" wheels (may be beneficial, depending on progress) PT Goals  Acute Rehab PT Goals PT Goal: Supine/Side to Sit - Progress: Progressing toward goal PT Goal: Sit to Supine/Side - Progress: Progressing toward goal PT Goal: Sit to Stand - Progress: Progressing toward goal PT Goal: Stand to Sit - Progress: Progressing toward goal PT Goal: Ambulate - Progress: Progressing toward goal  PT Treatment Precautions/Restrictions  Precautions Precautions:  (drain, colostomy, abd. incision) Restrictions Weight Bearing Restrictions: No Mobility (including Balance) Bed Mobility Bed Mobility: Yes Supine to Sit: HOB elevated (Comment degrees);6: Modified independent (Device/Increase time) Supine to Sit Details (indicate cue type and reason): HOB 60 degrees. Increased time Sit to Supine: 4: Min assist;HOB elevated (comment degrees) Sit to Supine - Details (indicate cue type and reason): Assist for LEs onto bed.  Transfers Transfers: Yes Sit to Stand Details (indicate cue type and reason): Min-guard assist VCs safety, hand placement Stand to Sit Details: Min-guard assist. VCs safety, hand placement Ambulation/Gait Ambulation/Gait: Yes Ambulation/Gait Assistance: 4: Min assist Ambulation/Gait Assistance Details (indicate cue type and reason): Assist to stabilize intermittently. Began ambulation with pt pushing IV pole, but switched over to RW for improved gait pattern and support.  Ambulation Distance (Feet): 500 Feet Assistive device: Rolling walker Gait Pattern: Step-through pattern;Trunk flexed    Exercise    End of Session PT - End of Session Activity Tolerance: Patient  tolerated treatment well Patient left: in bed;with call bell in reach;with family/visitor present General Behavior During Session: [facility] for tasks performed Cognition: [facility] for tasks performed  Nicholas [name] [facility] 12/29/2011, 3:44 PM [phone]

## 2011-12-30 LAB — GLUCOSE, CAPILLARY
Glucose-Capillary: 99 mg/dL (ref 70–99)
Glucose-Capillary: 97 mg/dL (ref 70–99)
Glucose-Capillary: 113 mg/dL — ABNORMAL HIGH (ref 70–99)

## 2011-12-30 MED ORDER — METFORMIN HCL ER 500 MG PO TB24
500.0000 mg | ORAL_TABLET | Freq: Every day | ORAL | Status: DC
  Administered 2011-12-31: 500 mg via ORAL
  Filled 2011-12-30: qty 1

## 2011-12-30 MED ORDER — ASPIRIN EC 81 MG PO TBEC
81.0000 mg | DELAYED_RELEASE_TABLET | Freq: Every day | ORAL | Status: DC
  Administered 2011-12-30 – 2011-12-31 (×2): 81 mg via ORAL
  Filled 2011-12-30 (×2): qty 1

## 2011-12-30 MED ORDER — NAPROXEN SODIUM 220 MG PO TABS: 220.0000 mg | ORAL_TABLET | Freq: Two times a day (BID) | ORAL | Status: DC | PRN

## 2011-12-30 MED ORDER — NAPROXEN 250 MG PO TABS
250.0000 mg | ORAL_TABLET | Freq: Two times a day (BID) | ORAL | Status: DC | PRN
  Filled 2011-12-30: qty 1

## 2011-12-30 MED ORDER — INSULIN ASPART 100 UNIT/ML ~~LOC~~ SOLN: 0.0000 [IU] | Freq: Three times a day (TID) | SUBCUTANEOUS | Status: DC

## 2011-12-30 MED ORDER — TAMSULOSIN HCL 0.4 MG PO CAPS
0.8000 mg | ORAL_CAPSULE | Freq: Every day | ORAL | Status: DC
  Administered 2011-12-30: 0.8 mg via ORAL
  Filled 2011-12-30 (×2): qty 2

## 2011-12-30 MED ORDER — GABAPENTIN 300 MG PO CAPS
300.0000 mg | ORAL_CAPSULE | Freq: Three times a day (TID) | ORAL | Status: DC
  Administered 2011-12-30 – 2011-12-31 (×4): 300 mg via ORAL
  Filled 2011-12-30 (×6): qty 1

## 2011-12-30 MED ORDER — METFORMIN HCL ER 500 MG PO TB24: 500.0000 mg | ORAL_TABLET | Freq: Every day | ORAL | Status: DC

## 2011-12-30 MED ORDER — OXYCODONE-ACETAMINOPHEN 5-325 MG PO TABS
1.0000 | ORAL_TABLET | ORAL | Status: DC | PRN
  Administered 2011-12-30 – 2011-12-31 (×7): 1 via ORAL
  Filled 2011-12-30: qty 1
  Filled 2011-12-30: qty 2
  Filled 2011-12-30 (×4): qty 1

## 2011-12-30 NOTE — Consult Note (Signed)
WOC ostomy consult  Stoma type/location: LLQ,colostomy Stomal assessment/size: 15/8" x 2 " oval budded stoma, pink and moist Output pasty brown fecal material Ostomy pouching: 2pc. 2 3/4", due for next change aprox. Monday of next week.  2 extra wafers and pouches at bedside for pt dc purposes.  Enrolled in [place] secure start dc program. Education provided: Had pt sit up on the side of the bed, emptied pouch with minimal assist and education provided on using wick of toilet paper to clean pouch after empty.  Wife at bedside to observe as well.  Pt independent with lock and roll closure.  Wife demonstrated cutting new wafer per template provided by [facility] nurse from last pouch change, wife and pt able to demonstrate attaching wafer to pouch and disconnecting pouch for disposal.  They did not have any other questions today at my visit.  Ostomy teaching booklet with wife.  Reviewed clothing options and how to wear pants, underware, undershirt and belt with pouch in place.  Will have HHRN for further support and education once home.  Provided WOC contact info.  [address], RN, [place] [phone]

## 2011-12-30 NOTE — Progress Notes (Addendum)
7 Days Post-Op  Subjective: Diet: full liquids, TM99, VSS,  Lot's of gas, no BM so far in colostomy bag.  Objective: Vital signs in last 24 hours: Temp:  [97.9 F (36.6 C)-99.1 F (37.3 C)] 97.9 F (36.6 C) (03/22 0602) Pulse Rate:  [69-80] 71  (03/22 0602) Resp:  [14-20] 18  (03/22 0602) BP: (119-144)/(65-86) 128/78 mmHg (03/22 0602) SpO2:  [94 %-100 %] 98 % (03/22 0602) Last BM Date: 12/22/11  Intake/Output from previous day: 03/21 0701 - 03/22 0700 In: 3608.2 [P.O.:720; I.V.:2888.2] Out: 3925 [Urine:3925] Intake/Output this shift:    General appearance: alert, cooperative and no distress Resp: clear to auscultation bilaterally and few rales at base. GI: Gas in ostomy bag, no stool, open abdominal wound is ok, clean and pink.  abd is still distended +BS  Lab Results:   Nicholas [name] 12/29/11 0456  WBC 6.0  HGB 12.9*  HCT 37.1*  PLT 203    BMET  Basename 12/29/11 0456  NA 138  K 3.7  CL 101  CO2 32  GLUCOSE 102*  BUN 9  CREATININE 0.75  CALCIUM 8.9   PT/INR No results found for this basename: LABPROT:2,INR:2 in the last 72 hours   Lab 12/29/11 0456 12/23/11 1035  AST 93* 17  ALT 92* 19  ALKPHOS 71 57  BILITOT 0.4 1.1  PROT 6.0 7.4  ALBUMIN 2.5* 3.8     Lipase     Component Value Date/Time   LIPASE 18 12/23/2011 1035     Studies/Results: No results found.  Medications:    . ertapenem  1 g Intravenous Q24H  . heparin subcutaneous  5,000 Units Subcutaneous Q8H  . insulin aspart  0-15 Units Subcutaneous Q4H  . morphine   Intravenous Q4H  . pantoprazole (PROTONIX) IV  40 mg Intravenous Q24H    Assessment/Plan Perforated sigmoid colon s/p Exploratory laparotomy, sigmoid colectomy, colostomy, incidental appendectomy 12/23/11 DR.Rosenbower  POD 7 dAY 7 iNVANZ Urinary retention post op  Diabetes mellitus on Metformin        .  OSA on CPAP / Wife will bring in tomorrow. He does not know settings    .  BPH (benign prostatic hypertrophy)    .   Neuropathy both lower legs and feet.  Chronic arm and hand pain after trauma  BMI 34.4  TOBACCO USE 40 YEARS 1-3PPD  Cardiac catheterization during sleep apnea reportedly showed nonobstructive disease.      Plan:  D/C PCA, oral and IV pain meds.  D/c FOLEY, restart  Most of  His home meds/flomax.  LOS: 7 days    Jennice Renegar 12/30/2011   Will discontinue JP and advance to regular diet.  Hopeful discharge tomorrow

## 2011-12-31 MED ORDER — OXYCODONE-ACETAMINOPHEN 5-325 MG PO TABS: 1.0000 | ORAL_TABLET | ORAL | Status: AC | PRN

## 2011-12-31 NOTE — Discharge Summary (Signed)
Physician Discharge Summary [facility]  Patient ID: Nicholas [name] MRN: [id number] DOB/AGE: 12-Jun-1941 71 y.o.  Admit date: 12/23/2011 Discharge date: 12/31/2011  Admission Diagnoses:  Perforated diverticulitis  Discharge Diagnoses:  Principal Problem:  *Diverticulitis of colon with perforation   Discharged Condition: good  Hospital Course: patient admitted urgently with perforated diverticulitis.  To OR for laparotomy and sigmoid colectomy with descending colostomy.  Post op course relatively straightforward with wound care, ostomy care.  IV Invanz given for 7 days.  Diet advanced as tolerated to regular.  Prepared for discharge home on 8th post op day.  Consults: None  Significant Diagnostic Studies: none  Treatments: surgery: ex lap, sigmoid colectomy, colostomy  Discharge Exam: Blood pressure 127/74, pulse 71, temperature 98 F (36.7 C), temperature source Oral, resp. rate 16, height 6' (1.829 m), weight 245 lb (111.131 kg), SpO2 97.00%. HEENT - clear Chest - clear bilat Cor - RRR Abd - soft without distension; ostomy LLQ with stool and gas; midline wound with intact dressing Ext - no edema Neuro - grossly intact; no focal deficits  Disposition: Home with family; will arrange HHN to start 01/01/2012  Discharge Orders    Future Orders Please Complete By Expires   Diet - low sodium heart healthy      Increase activity slowly      Discharge instructions      Comments:   DISCHARGE INSTRUCTIONS TO PATIENT  REMINDER:  Carry a list of your medications and allergies with you at all times Call your pharmacy at least 1 week in advance to refill prescriptions Do not mix any prescribed pain medicine with alcohol Do not drive any motor vehicles while taking pain medication Take medications with food unless otherwise directed  Follow-up appointments (date to return to physician): Please call [phone] to confirm your follow up appointment with your  surgeon.  Call your Surgeon if you have: Temperature greater than 100.4 Persistent nausea and vomiting Severe uncontrolled pain Redness, tenderness, or signs of infection (pain, swelling, redness, odor or green/yellow discharge around the site) Difficulty breathing, headache or visual disturbances Hives Persistent dizziness or light-headedness Any other questions or concerns you may have after discharge  In an emergency, call 911 or go to an Emergency Department at a nearby hospital.   Diet: Begin with liquids, and if they are tolerated, resume your usual diet.  Avoid spicy, greasy or heavy foods.  If you have nausea or vomiting, go back to liquids.  If you cannot keep liquids down, call your doctor.  Avoid alcohol consumption while on prescription pain medications. Good nutrition promotes healing. Increase fiber and fluids.   ADDITIONAL INSTRUCTIONS: Ostomy care and wound dressing changes per Home Health Nurse - to begin visits on Sunday, March 24th.  Velora Heckler, MD, [facility] Office: [phone]   I understand and acknowledge receipt of the above instructions.  Patient or Guardian Signature                                                               Date/Time                                                                                                                                        R.N.'s Signature                                                                    Date/Time    Change dressing (specify)      Comments:   Dressing change: daily wet to dry with NS and gauze.  Stoma care as instructed. tmg     Medication List  As of 12/31/2011  8:37 AM   STOP taking these medications         azithromycin 250 MG tablet         TAKE these medications         acetaminophen 500 MG tablet   Commonly  known as: TYLENOL   Take 1,000 mg by mouth every 6 (six) hours as needed. For pain.      aspirin EC 81 MG tablet   Take 81 mg by mouth daily.      FLOMAX 0.4 MG Caps   Generic drug: Tamsulosin HCl   Take 0.8 mg by mouth at bedtime.      gabapentin 300 MG capsule   Commonly known as: NEURONTIN   TAKE ONE CAPSULE BY MOUTH 3 TIMES A DAY      metformin 500 MG (OSM) 24 hr tablet   Commonly known as: FORTAMET   Take 1 tablet (500 mg total) by mouth daily.      naproxen sodium 220 MG tablet   Commonly known as: ANAPROX   Take 220 mg by mouth 2 (two) times daily as needed. For pain.      oxyCODONE-acetaminophen 5-325 MG per tablet   Commonly known as: PERCOCET   Take 1-2 tablets by mouth every 4 (four) hours as needed.      traMADol 50 MG tablet   Commonly known as: ULTRAM   TAKE 1 TABLET BY MOUTH EVERY 6 HOURS AS NEEDED FOR PAIN           Follow-up Information    Follow up with Ruthe Mannan, MD .        Velora Heckler, MD, [facility] Office: [phone]   Signed: Velora Heckler 12/31/2011, 8:37 AM

## 2011-12-31 NOTE — Progress Notes (Signed)
CARE MANAGEMENT NOTE 12/31/2011  Patient:  HOGAN, HOOBLER   Account Number:  [id number]  Date Initiated:  12/31/2011  Documentation initiated by:  Jasmon Graffam  Subjective/Objective Assessment:   71 yo male admitted with diverticulitis. PTA pt lived with spouse.     Action/Plan:   Home when stable   Anticipated DC Date:  12/31/2011   Anticipated DC Plan:  HOME W HOME HEALTH SERVICES  In-house referral  NA      DC Planning Services  CM consult      [facility] Choice  HOME HEALTH   Choice offered to / List presented to:  C-1 Patient   DME arranged  NA      DME agency  NA     HH arranged  HH-1 RN      [facility] agency  [facility]   Status of service:  Completed, signed off Medicare Important Message given?   (If response is "NO", the following Medicare IM given date fields will be blank) Date Medicare IM given:   Date Additional Medicare IM given:    Discharge Disposition:    Per UR Regulation:    If discussed at Long Length of Stay Meetings, dates discussed:    Comments:  12/30/10 1220 Leonie Green [phone] Cm spoke with pt concerning dc planning. Per pt choice [facility] to provide [facility] for wound and ostomy care. Per pt received adequate education from [facility] RN and Architect. Ostomy wafer changed prior to discharge, [facility] contacted  concerning new referral. Per [facility] rep Talmadge [employer] start of care scheduled 01/01/12. pt agrees with plan of care.

## 2011-12-31 NOTE — Discharge Summary (Signed)
DISCHARGE INSTRUCTIONS TO PATIENT  REMINDER:   Carry a list of your medications and allergies with you at all times  Call your pharmacy at least 1 week in advance to refill prescriptions  Do not mix any prescribed pain medicine with alcohol  Do not drive any motor vehicles while taking pain medication  Take medications with food unless otherwise directed  Follow-up appointments (date to return to physician): Please call [phone] to confirm your follow up appointment with your surgeon.  Call your Surgeon if you have:  Temperature greater than 100.4  Persistent nausea and vomiting  Severe uncontrolled pain  Redness, tenderness, or signs of infection (pain, swelling, redness, odor or green/yellow discharge around the site)  Difficulty breathing, headache or visual disturbances  Hives  Persistent dizziness or light-headedness  Any other questions or concerns you may have after discharge  In an emergency, call 911 or go to an Emergency Department at a nearby hospital.  Diet: Begin with liquids, and if they are tolerated, resume your usual diet.  Avoid spicy, greasy or heavy foods.  If you have nausea or vomiting, go back to liquids.  If you cannot keep liquids down, call your doctor.  Avoid alcohol consumption while on prescription pain medications. Good nutrition promotes healing. Increase fiber and fluids.   ADDITIONAL INSTRUCTIONS: Home health nurse to provide wound care instruction and ostomy care instruction beginning Sunday, March 24th. Follow up in [facility] office with Dr. Avel Peace - call for appointment at [phone].  Velora Heckler, MD, [facility] Office: [phone]   I understand and acknowledge receipt of the above instructions.                                                                                                                                         Patient or Guardian Signature                                                                Date/Time  R.N.'s Signature                                                                    Date/Time

## 2011-12-31 NOTE — Progress Notes (Signed)
Patient has ben refusing cpap and PA notes indicate patient may be discharged tomorrow. Will discuss with patient in AM if he wants to try our cpap unit if he is not discharged.

## 2011-12-31 NOTE — Progress Notes (Signed)
Patient for d/c to home today. Patient empty colostomy bag. Wafer and bag changed per this nurse with patient and son observing.

## 2012-01-01 DIAGNOSIS — Z433 Encounter for attention to colostomy: Secondary | ICD-10-CM | POA: Diagnosis not present

## 2012-01-01 DIAGNOSIS — Z48815 Encounter for surgical aftercare following surgery on the digestive system: Secondary | ICD-10-CM | POA: Diagnosis not present

## 2012-01-01 DIAGNOSIS — E1149 Type 2 diabetes mellitus with other diabetic neurological complication: Secondary | ICD-10-CM | POA: Diagnosis not present

## 2012-01-01 DIAGNOSIS — E1142 Type 2 diabetes mellitus with diabetic polyneuropathy: Secondary | ICD-10-CM | POA: Diagnosis not present

## 2012-01-02 LAB — GLUCOSE, CAPILLARY: Glucose-Capillary: 110 mg/dL — ABNORMAL HIGH (ref 70–99)

## 2012-01-04 ENCOUNTER — Encounter (HOSPITAL_COMMUNITY): Payer: Self-pay | Admitting: General Surgery

## 2012-01-04 ENCOUNTER — Telehealth (INDEPENDENT_AMBULATORY_CARE_PROVIDER_SITE_OTHER): Payer: Self-pay

## 2012-01-04 NOTE — Telephone Encounter (Signed)
Protocol hydrocodone called [facility] [place] [phone].

## 2012-01-11 ENCOUNTER — Telehealth (INDEPENDENT_AMBULATORY_CARE_PROVIDER_SITE_OTHER): Payer: Self-pay

## 2012-01-11 NOTE — Telephone Encounter (Signed)
Hydrocodone refill called into [facility] [place]. Left message for patient.

## 2012-01-11 NOTE — Telephone Encounter (Signed)
Patients wife called for husband wanting refill hydrocodone 5/325. Sig colectomy 3/15. Already had protocol refilled on 3/27. Please advise (TR on vacation).Marland KitchenMarland Kitchen

## 2012-01-16 ENCOUNTER — Other Ambulatory Visit: Payer: Self-pay | Admitting: *Deleted

## 2012-01-16 NOTE — Telephone Encounter (Signed)
Pt's states he's not taking this.  It was given to him for swelling in his feet and legs but he says it doesn't help.  He doesn't want this refilled.  Advised pharmacy.

## 2012-01-16 NOTE — Telephone Encounter (Signed)
Please call pt to find out how is taking this- is he taking one tablet daily or just as needed? Thanks

## 2012-01-16 NOTE — Telephone Encounter (Signed)
Faxed request from [facility] [place] for HCTZ 12.5 mg's.  This isnt on med list.

## 2012-01-17 ENCOUNTER — Other Ambulatory Visit: Payer: Self-pay | Admitting: *Deleted

## 2012-01-17 NOTE — Telephone Encounter (Signed)
I think this was refilled yesterday correct?  If not, ok to refill 30 day supply with 2 refills.

## 2012-01-17 NOTE — Telephone Encounter (Signed)
Patient pharmacy requesting refill on HCTZ 12.5 mg tablets take one tablet daily. Medication was discontinued on 12-23-2011. Please advise.

## 2012-01-17 NOTE — Telephone Encounter (Signed)
Pt told me yesterday that he doesn't take this medicine and doesn't want this refilled. I told the pharmacist that yesterday, and have told them again today.  They apologized for the error.

## 2012-01-18 ENCOUNTER — Encounter (INDEPENDENT_AMBULATORY_CARE_PROVIDER_SITE_OTHER): Payer: Self-pay | Admitting: General Surgery

## 2012-01-18 ENCOUNTER — Ambulatory Visit (INDEPENDENT_AMBULATORY_CARE_PROVIDER_SITE_OTHER): Payer: Medicare Other | Admitting: General Surgery

## 2012-01-18 VITALS — BP 127/80 | HR 85 | Temp 98.2°F | Resp 20 | Ht 72.0 in | Wt 231.6 lb

## 2012-01-18 DIAGNOSIS — Z9889 Other specified postprocedural states: Secondary | ICD-10-CM

## 2012-01-18 MED ORDER — HYDROCODONE-ACETAMINOPHEN 5-325 MG PO TABS: 1.0000 | ORAL_TABLET | Freq: Four times a day (QID) | ORAL | Status: AC | PRN

## 2012-01-18 NOTE — Patient Instructions (Signed)
Continue current wound care. Continue light activities.

## 2012-01-18 NOTE — Progress Notes (Signed)
Operation: Emergency exploratory laparotomy with sigmoid colectomy and colostomy for perforated diverticulitis  Date: December 23, 2011  Pathology:  Diverticulitis with abscess  HPI:  He is here for his first postoperative visit.  His midline wound is healing by secondary intention and his wife is doing damp to dry dressing changes.  The colostomy is working well. He is eating well. He still has intermittent nursing visits.   Physical Exam:  Gen.-he looks well and in no acute distress  Abdomen-left-sided colostomy is pink and draining stool. Midline incision is healing nicely with granulation tissue present.   Assessment:  He is doing well postoperatively. He has never had a colonoscopy.  We briefly discussed the process for colostomy closure.  Plan:  Continue wound care and light activities. I refilled his hydrocodone. Return visit one month.

## 2012-01-25 ENCOUNTER — Telehealth: Payer: Self-pay | Admitting: *Deleted

## 2012-01-25 NOTE — Telephone Encounter (Signed)
Ok to send

## 2012-01-25 NOTE — Telephone Encounter (Signed)
Form from [employer] is on your desk.  They are asking for medical records for documentation of patient's need for diabetic supplies.  OK to send?

## 2012-01-25 NOTE — Telephone Encounter (Signed)
Faxed office notes from 11/18/11 and 08/23/11 and lab from 11/20/10.

## 2012-02-23 ENCOUNTER — Ambulatory Visit (INDEPENDENT_AMBULATORY_CARE_PROVIDER_SITE_OTHER): Payer: Medicare Other | Admitting: General Surgery

## 2012-02-23 ENCOUNTER — Encounter (INDEPENDENT_AMBULATORY_CARE_PROVIDER_SITE_OTHER): Payer: Self-pay | Admitting: General Surgery

## 2012-02-23 VITALS — BP 117/60 | HR 81 | Temp 97.9°F | Ht 72.0 in | Wt 231.4 lb

## 2012-02-23 DIAGNOSIS — Z9889 Other specified postprocedural states: Secondary | ICD-10-CM

## 2012-02-23 NOTE — Patient Instructions (Signed)
Activities as tolerated.  May return to full duty work.

## 2012-02-23 NOTE — Progress Notes (Signed)
Operation: Emergency exploratory laparotomy with sigmoid colectomy and colostomy for perforated diverticulitis  Date: December 23, 2011  Pathology:  Diverticulitis with abscess  HPI:  He is here for another postoperative visit.  His midline wound has healed.  The colostomy is working well. He is eating well. His energy level is good and he wants to go back to work.  He has never had a colonoscopy.   Physical Exam:  Gen.-he looks well and in no acute distress  Abdomen-left-sided colostomy is pink and draining stool. Midline incision is healed and is solid.   Assessment:   Wound has healed and his is doing well.  Plan:  Activities as tolerated.  May return to work.  Will make referral for colonoscopy and then do a barium enema after the colonoscopy to see the distance between the Hartmann's pouch and colostomy.  RTC 6 weeks.

## 2012-03-01 ENCOUNTER — Other Ambulatory Visit (INDEPENDENT_AMBULATORY_CARE_PROVIDER_SITE_OTHER): Payer: Self-pay | Admitting: General Surgery

## 2012-03-01 ENCOUNTER — Telehealth: Payer: Self-pay | Admitting: Internal Medicine

## 2012-03-01 DIAGNOSIS — K5732 Diverticulitis of large intestine without perforation or abscess without bleeding: Secondary | ICD-10-CM

## 2012-03-01 NOTE — Telephone Encounter (Signed)
Informed Nicholas [name] at [facility] that Dr Rhea Belton can do the COLON thru stoma and thru the rectum. This will be discussed on pt's 04/02/12 visit. Cyndra Numbers will go ahead and schedule the Barium Enema.

## 2012-03-01 NOTE — Telephone Encounter (Signed)
Dr Rhea Belton, can you look at Dr Maris Berger note from 02/23/12 OV? Can we schedule a COLON thru the stoma and rectum and do the BR on the same day? Thanks.

## 2012-03-01 NOTE — Telephone Encounter (Signed)
Okay to schedule for colonoscopy via stoma and can look at rectal stump same day. Likely barium study will need to be on a different day given sedation for our exam, etc.

## 2012-03-12 ENCOUNTER — Telehealth (INDEPENDENT_AMBULATORY_CARE_PROVIDER_SITE_OTHER): Payer: Self-pay

## 2012-03-12 ENCOUNTER — Other Ambulatory Visit: Payer: Self-pay

## 2012-03-12 MED ORDER — METFORMIN HCL ER (OSM) 500 MG PO TB24: 500.0000 mg | ORAL_TABLET | Freq: Every day | ORAL | Status: DC

## 2012-03-12 NOTE — Telephone Encounter (Signed)
[facility] [place] faxed refill metformin 500 mg #30 x 6.

## 2012-03-12 NOTE — Telephone Encounter (Signed)
Pt's wife called to reschedule barium enema for 6/4.  I rescheduled for 9:00 am, 6/6.  She is aware.

## 2012-03-13 ENCOUNTER — Other Ambulatory Visit (HOSPITAL_COMMUNITY): Payer: Medicare Other

## 2012-03-15 ENCOUNTER — Ambulatory Visit (HOSPITAL_COMMUNITY)
Admission: RE | Admit: 2012-03-15 | Discharge: 2012-03-15 | Disposition: A | Discharge status: Home / Self Care | Payer: Medicare Other | Source: Ambulatory Visit | Attending: General Surgery | Admitting: General Surgery

## 2012-03-15 DIAGNOSIS — Z01818 Encounter for other preprocedural examination: Secondary | ICD-10-CM | POA: Insufficient documentation

## 2012-03-15 DIAGNOSIS — K5732 Diverticulitis of large intestine without perforation or abscess without bleeding: Secondary | ICD-10-CM | POA: Diagnosis not present

## 2012-03-15 DIAGNOSIS — Z5189 Encounter for other specified aftercare: Secondary | ICD-10-CM | POA: Diagnosis not present

## 2012-03-30 ENCOUNTER — Encounter: Payer: Self-pay | Admitting: Internal Medicine

## 2012-04-02 ENCOUNTER — Ambulatory Visit: Payer: Medicare Other | Admitting: Internal Medicine

## 2012-04-05 ENCOUNTER — Ambulatory Visit (INDEPENDENT_AMBULATORY_CARE_PROVIDER_SITE_OTHER): Payer: Medicare Other | Admitting: General Surgery

## 2012-04-05 VITALS — BP 134/72 | HR 86 | Temp 98.0°F | Ht 72.0 in | Wt 234.6 lb

## 2012-04-05 DIAGNOSIS — Z433 Encounter for attention to colostomy: Secondary | ICD-10-CM | POA: Diagnosis not present

## 2012-04-05 NOTE — Patient Instructions (Signed)
Call Page Spiro at [phone] and make an appointment with her to check your colostomy site.

## 2012-04-05 NOTE — Progress Notes (Signed)
Operation: Emergency exploratory laparotomy with sigmoid colectomy and colostomy for perforated diverticulitis  Date: December 23, 2011  Pathology:  Diverticulitis with abscess  HPI:  He is here a follow up  visit.  He is just getting his strength back is has been able to work fairly well.  He is having some problems with the skin around his colostomy site.  He underwent a colonoscopy last month.  He also had a barium enema which I have reviewed.    Physical Exam:  Gen.-he looks well and in no acute distress  Abdomen-left-sided colostomy is pink and draining stool. Midline incision is healed and is solid.   Assessment:  He is doing well now.  I discussed colostomy reversal with him but he is not quite ready for this as he has some things he needs to do first.  Plan:  RTC one month.  Will get colonoscopy report.

## 2012-04-06 ENCOUNTER — Telehealth: Payer: Self-pay | Admitting: *Deleted

## 2012-04-06 NOTE — Telephone Encounter (Addendum)
Pt cancelled his COLON and had the BE. Dr Abbey Chatters needs the COLON from the stoma to be done so he reverse the colostomy.

## 2012-04-06 NOTE — Telephone Encounter (Signed)
Message copied by Florene Glen on Fri Apr 06, 2012  2:47 PM ------      Message from: Beverley Fiedler      Created: Thu Apr 05, 2012 10:41 PM       Eryc't remember if I included you, but please touch base with patient and get colonoscopy scheduled.            ----- Message -----         From: Adolph Pollack, MD         Sent: 04/05/2012   4:54 PM           To: Beverley Fiedler, MD            Tawni Carnes,            How did his colonoscopy turn out.  I could not find it in EPIC.            [place] Manners.

## 2012-04-09 NOTE — Telephone Encounter (Signed)
Pt scheduled for previsit 04/13/12@4pm . Colon scheduled in the [facility] 04/18/12@4pm . Spoke with pts wife and they are aware of appt dates and times.

## 2012-04-13 ENCOUNTER — Ambulatory Visit (AMBULATORY_SURGERY_CENTER): Payer: Medicare Other | Admitting: *Deleted

## 2012-04-13 VITALS — Ht 72.0 in | Wt 237.6 lb

## 2012-04-13 DIAGNOSIS — Z1211 Encounter for screening for malignant neoplasm of colon: Secondary | ICD-10-CM

## 2012-04-13 MED ORDER — NA SULFATE-K SULFATE-MG SULF 17.5-3.13-1.6 GM/177ML PO SOLN: 1.0000 | ORAL | Status: AC

## 2012-04-18 ENCOUNTER — Encounter: Payer: Self-pay | Admitting: Internal Medicine

## 2012-04-18 ENCOUNTER — Other Ambulatory Visit: Payer: Self-pay | Admitting: Family Medicine

## 2012-04-18 ENCOUNTER — Ambulatory Visit (AMBULATORY_SURGERY_CENTER): Payer: Medicare Other | Admitting: Internal Medicine

## 2012-04-18 VITALS — BP 106/82 | HR 75 | Temp 97.2°F | Resp 20 | Ht 72.0 in | Wt 237.0 lb

## 2012-04-18 DIAGNOSIS — D126 Benign neoplasm of colon, unspecified: Secondary | ICD-10-CM

## 2012-04-18 DIAGNOSIS — F411 Generalized anxiety disorder: Secondary | ICD-10-CM | POA: Diagnosis not present

## 2012-04-18 DIAGNOSIS — Z933 Colostomy status: Secondary | ICD-10-CM | POA: Diagnosis not present

## 2012-04-18 DIAGNOSIS — K6289 Other specified diseases of anus and rectum: Secondary | ICD-10-CM | POA: Diagnosis not present

## 2012-04-18 DIAGNOSIS — Z1211 Encounter for screening for malignant neoplasm of colon: Secondary | ICD-10-CM | POA: Diagnosis not present

## 2012-04-18 LAB — GLUCOSE, CAPILLARY: Glucose-Capillary: 84 mg/dL (ref 70–99)

## 2012-04-18 MED ORDER — SODIUM CHLORIDE 0.9 % IV SOLN: 500.0000 mL | INTRAVENOUS | Status: DC

## 2012-04-18 NOTE — Progress Notes (Signed)
Patient did not experience any of the following events: a burn prior to discharge; a fall within the facility; wrong site/side/patient/procedure/implant event; or a hospital transfer or hospital admission upon discharge from the facility. (G8907) Patient did not have preoperative order for IV antibiotic SSI prophylaxis. (G8918)  

## 2012-04-18 NOTE — Progress Notes (Signed)
Propofol per m smith crna. See scanned intra procedure report. ewm 

## 2012-04-18 NOTE — Patient Instructions (Signed)
FOLLOW UP WITH DR. Abbey Chatters AS SCHEDULED.   YOU HAD AN ENDOSCOPIC PROCEDURE TODAY AT THE [facility]: Refer to the procedure report that was given to you for any specific questions about what was found during the examination.  If the procedure report does not answer your questions, please call your gastroenterologist to clarify.  If you requested that your care partner not be given the details of your procedure findings, then the procedure report has been included in a sealed envelope for you to review at your convenience later.  YOU SHOULD EXPECT: Some feelings of bloating in the abdomen. Passage of more gas than usual.  Walking can help get rid of the air that was put into your GI tract during the procedure and reduce the bloating. If you had a lower endoscopy (such as a colonoscopy or flexible sigmoidoscopy) you may notice spotting of blood in your stool or on the toilet paper. If you underwent a bowel prep for your procedure, then you may not have a normal bowel movement for a few days.  DIET: Your first meal following the procedure should be a light meal and then it is ok to progress to your normal diet.  A half-sandwich or bowl of soup is an example of a good first meal.  Heavy or fried foods are harder to digest and may make you feel nauseous or bloated.  Likewise meals heavy in dairy and vegetables can cause extra gas to form and this can also increase the bloating.  Drink plenty of fluids but you should avoid alcoholic beverages for 24 hours.  ACTIVITY: Your care partner should take you home directly after the procedure.  You should plan to take it easy, moving slowly for the rest of the day.  You can resume normal activity the day after the procedure however you should NOT DRIVE or use heavy machinery for 24 hours (because of the sedation medicines used during the test).    SYMPTOMS TO REPORT IMMEDIATELY: A gastroenterologist can be reached at any hour.  During normal business  hours, 8:30 AM to 5:00 PM Monday through Friday, call [phone].  After hours and on weekends, please call the GI answering service at [phone] who will take a message and have the physician on call contact you.   Following lower endoscopy (colonoscopy or flexible sigmoidoscopy):  Excessive amounts of blood in the stool  Significant tenderness or worsening of abdominal pains  Swelling of the abdomen that is new, acute  Fever of 100F or higher  Following upper endoscopy (EGD)  Vomiting of blood or coffee ground material  New chest pain or pain under the shoulder blades  Painful or persistently difficult swallowing  New shortness of breath  Fever of 100F or higher  Black, tarry-looking stools  FOLLOW UP: If any biopsies were taken you will be contacted by phone or by letter within the next 1-3 weeks.  Call your gastroenterologist if you have not heard about the biopsies in 3 weeks.  Our staff will call the home number listed on your records the next business day following your procedure to check on you and address any questions or concerns that you may have at that time regarding the information given to you following your procedure. This is a courtesy call and so if there is no answer at the home number and we have not heard from you through the emergency physician on call, we will assume that you have returned to your regular daily  activities without incident.  SIGNATURES/CONFIDENTIALITY: You and/or your care partner have signed paperwork which will be entered into your electronic medical record.  These signatures attest to the fact that that the information above on your After Visit Summary has been reviewed and is understood.  Full responsibility of the confidentiality of this discharge information lies with you and/or your care-partner.  

## 2012-04-18 NOTE — Progress Notes (Signed)
ENCOURAGED PATIENT/WIFE TO PICK UP SOMETHING FOR DINNER ON THE WAY HOME.

## 2012-04-18 NOTE — Op Note (Signed)
[facility] [address] [place], [place]  [address]  COLONOSCOPY PROCEDURE REPORT  PATIENT:  Nicholas [name], Nicholas [name]  MR#:  [id number] BIRTHDATE:  November 17, 1940, 71 yrs. old  GENDER:  male ENDOSCOPIST:  Carie Caddy. Rogers Ditter, MD REF. BY:  Avel Peace, M.D. PROCEDURE DATE:  04/18/2012 PROCEDURE:  Colonoscopy with biopsy, Colonoscopy with snare polypectomy, Colon with cold biopsy polypectomy ASA CLASS:  Class II INDICATIONS:  Routine Risk Screening, history of diverticulitis s/p sigmoid resection March 2013 MEDICATIONS:   MAC sedation, administered by CRNA, propofol (Diprivan) 250 mg IV  DESCRIPTION OF PROCEDURE:   After the risks benefits and alternatives of the procedure were thoroughly explained, informed consent was obtained.  Digital rectal exam was performed and revealed no rectal masses., normal stoma and ostomy The LB CF-[id number] endoscope was introduced through the anus and advanced to the cecum, which was identified by both the appendix and ileocecal valve, without limitations.  The quality of the prep was good, using MoviPrep.  The instrument was then slowly withdrawn as the colon was fully examined. <<PROCEDUREIMAGES>>  FINDINGS:  Normal appearing colostomy.  Colonoscope introduced 1st via stoma and advanced to cecum.  Three sessile polyps, 3 - 5 mm were found in the descending colon. The polyps were removed using cold biopsy forceps.  Mild diverticulosis was found in the descending colon.  Scope then advanced via anus to sigmoid stump. Mild proctitis was identified in the rectum.  There was also thickened mucus in the rectum.  This could represent diversion colitis/proctitis. Multiple biopsies were obtained and sent to pathology.  A 6 mm sessile polyp was found in the sigmoid colon. Polyp was snared, then cauterized with monopolar cautery. Retrieval was successful.  A 2 mm sessile polyp was found in the recto-sigmoid colon. The polyp was removed using cold  biopsy forceps.   Retroflexion was not performed due to proctitis. The scope was then withdrawn from the cecum and the procedure completed.  COMPLICATIONS:  None ENDOSCOPIC IMPRESSION: 1) 3 sessile polyps in the descending colon. Removed and sent to pathology. 2) Mild diverticulosis in the descending colon 3) Mild proctitis, possible diversion colitis.  Multiple biopsies obtained. 4) Sessile polyp in the sigmoid colon. Removed and sent to pathology. 5) Sessile polyp in the recto-sigmoid colon. Removed and sent to pathology.  RECOMMENDATIONS: 1) Await pathology results 2) High fiber diet. 3) If the polyps removed today are proven to be adenomatous (pre-cancerous) polyps, you will need a colonoscopy in 3 years. Otherwise you should continue to follow colorectal cancer screening guidelines for "routine risk" patients with a colonoscopy in 10 years. You will receive a letter within 1-2 weeks with the results of your biopsy as well as final recommendations. Please call my office if you have not received a letter after 3 weeks. 4) Follow with Dr. Abbey Chatters as scheduled.  Carie Caddy. Rhea Belton, MD  CC:  Ruthe Mannan MD Avel Peace, MD The Patient  n. eSIGNED:   Carie Caddy. Janea Schwenn at 04/18/2012 05:25 PM  Stefani Dama, [phone]

## 2012-04-19 ENCOUNTER — Telehealth: Payer: Self-pay | Admitting: *Deleted

## 2012-04-19 NOTE — Telephone Encounter (Signed)
  Follow up Call-  Call back number 04/18/2012  Post procedure Call Back phone  # [phone]  Permission to leave phone message Yes     Patient questions:  Do you have a fever, pain , or abdominal swelling? no Pain Score  0 *  Have you tolerated food without any problems? yes  Have you been able to return to your normal activities? yes  Do you have any questions about your discharge instructions: Diet   no Medications  no Follow up visit  no  Do you have questions or concerns about your Care? no  Actions: * If pain score is 4 or above: No action needed, pain <4.

## 2012-04-24 ENCOUNTER — Encounter: Payer: Self-pay | Admitting: Internal Medicine

## 2012-05-03 ENCOUNTER — Other Ambulatory Visit: Payer: Self-pay | Admitting: *Deleted

## 2012-05-03 MED ORDER — ESZOPICLONE 3 MG PO TABS: 3.0000 mg | ORAL_TABLET | Freq: Every day | ORAL | Status: DC

## 2012-05-03 NOTE — Telephone Encounter (Signed)
Medicine called to [facility]. 

## 2012-05-03 NOTE — Telephone Encounter (Signed)
Faxed refill request for lunesta 3 mg's from [facility].  This is no longer on med list.

## 2012-05-03 NOTE — Telephone Encounter (Signed)
Please phone in as entered below. 

## 2012-05-16 ENCOUNTER — Encounter (INDEPENDENT_AMBULATORY_CARE_PROVIDER_SITE_OTHER): Payer: Self-pay | Admitting: General Surgery

## 2012-05-16 ENCOUNTER — Ambulatory Visit (INDEPENDENT_AMBULATORY_CARE_PROVIDER_SITE_OTHER): Payer: Medicare Other | Admitting: General Surgery

## 2012-05-16 VITALS — BP 120/68 | HR 81 | Ht 72.0 in | Wt 231.6 lb

## 2012-05-16 DIAGNOSIS — Z933 Colostomy status: Secondary | ICD-10-CM

## 2012-05-16 NOTE — Patient Instructions (Signed)
The colostomy specialist is Berline Lopes at [facility] on [address], [phone].

## 2012-05-16 NOTE — Progress Notes (Signed)
Operation: Emergency exploratory laparotomy with sigmoid colectomy and colostomy for perforated diverticulitis  Date: December 23, 2011  Pathology:  Diverticulitis with abscess  HPI:  He is here another follow up  visit.  He has his strength back and is very busy with work and other things.  He is still having some problems with the skin around his colostomy site.  I reviewed the colonoscopy which was essentially unremarkable.  Physical Exam:  Gen.-he looks well and in no acute distress  Abdomen-left-sided colostomy is pink and draining stool. Midline incision is healed and is solid.   Assessment:  He is doing well now.  He is interested in colostomy reversal in the fall. Plan:  RTC 6 weeks at which time we will discuss the procedure and get it scheduled.

## 2012-06-22 ENCOUNTER — Encounter (INDEPENDENT_AMBULATORY_CARE_PROVIDER_SITE_OTHER): Payer: Medicare Other | Admitting: General Surgery

## 2012-07-02 DIAGNOSIS — J019 Acute sinusitis, unspecified: Secondary | ICD-10-CM | POA: Diagnosis not present

## 2012-07-06 DIAGNOSIS — M5137 Other intervertebral disc degeneration, lumbosacral region: Secondary | ICD-10-CM | POA: Diagnosis not present

## 2012-07-06 DIAGNOSIS — IMO0001 Reserved for inherently not codable concepts without codable children: Secondary | ICD-10-CM | POA: Diagnosis not present

## 2012-07-06 DIAGNOSIS — M999 Biomechanical lesion, unspecified: Secondary | ICD-10-CM | POA: Diagnosis not present

## 2012-07-09 ENCOUNTER — Other Ambulatory Visit: Payer: Self-pay | Admitting: Family Medicine

## 2012-07-09 ENCOUNTER — Telehealth: Payer: Self-pay | Admitting: Family Medicine

## 2012-07-09 DIAGNOSIS — IMO0001 Reserved for inherently not codable concepts without codable children: Secondary | ICD-10-CM | POA: Diagnosis not present

## 2012-07-09 DIAGNOSIS — M5137 Other intervertebral disc degeneration, lumbosacral region: Secondary | ICD-10-CM | POA: Diagnosis not present

## 2012-07-09 DIAGNOSIS — M999 Biomechanical lesion, unspecified: Secondary | ICD-10-CM | POA: Diagnosis not present

## 2012-07-09 NOTE — Telephone Encounter (Signed)
Medicine called to pharmacy. 

## 2012-07-09 NOTE — Telephone Encounter (Signed)
Medicine called to [name].

## 2012-07-09 NOTE — Telephone Encounter (Signed)
The pt called hoping to get a refill on hydrocodone.  Callback - [phone]  Thanks!

## 2012-07-10 DIAGNOSIS — M5137 Other intervertebral disc degeneration, lumbosacral region: Secondary | ICD-10-CM | POA: Diagnosis not present

## 2012-07-10 DIAGNOSIS — M999 Biomechanical lesion, unspecified: Secondary | ICD-10-CM | POA: Diagnosis not present

## 2012-07-10 DIAGNOSIS — IMO0001 Reserved for inherently not codable concepts without codable children: Secondary | ICD-10-CM | POA: Diagnosis not present

## 2012-07-11 DIAGNOSIS — M5137 Other intervertebral disc degeneration, lumbosacral region: Secondary | ICD-10-CM | POA: Diagnosis not present

## 2012-07-11 DIAGNOSIS — IMO0001 Reserved for inherently not codable concepts without codable children: Secondary | ICD-10-CM | POA: Diagnosis not present

## 2012-07-11 DIAGNOSIS — M999 Biomechanical lesion, unspecified: Secondary | ICD-10-CM | POA: Diagnosis not present

## 2012-07-15 DIAGNOSIS — M999 Biomechanical lesion, unspecified: Secondary | ICD-10-CM | POA: Diagnosis not present

## 2012-07-15 DIAGNOSIS — M5137 Other intervertebral disc degeneration, lumbosacral region: Secondary | ICD-10-CM | POA: Diagnosis not present

## 2012-07-15 DIAGNOSIS — IMO0001 Reserved for inherently not codable concepts without codable children: Secondary | ICD-10-CM | POA: Diagnosis not present

## 2012-07-16 DIAGNOSIS — M999 Biomechanical lesion, unspecified: Secondary | ICD-10-CM | POA: Diagnosis not present

## 2012-07-16 DIAGNOSIS — M5137 Other intervertebral disc degeneration, lumbosacral region: Secondary | ICD-10-CM | POA: Diagnosis not present

## 2012-07-16 DIAGNOSIS — IMO0001 Reserved for inherently not codable concepts without codable children: Secondary | ICD-10-CM | POA: Diagnosis not present

## 2012-07-24 ENCOUNTER — Other Ambulatory Visit (INDEPENDENT_AMBULATORY_CARE_PROVIDER_SITE_OTHER): Payer: Self-pay | Admitting: General Surgery

## 2012-07-24 ENCOUNTER — Ambulatory Visit (INDEPENDENT_AMBULATORY_CARE_PROVIDER_SITE_OTHER): Payer: Medicare Other | Admitting: General Surgery

## 2012-07-24 ENCOUNTER — Encounter (INDEPENDENT_AMBULATORY_CARE_PROVIDER_SITE_OTHER): Payer: Self-pay | Admitting: General Surgery

## 2012-07-24 VITALS — BP 130/70 | HR 74 | Temp 98.1°F | Resp 14 | Ht 72.0 in | Wt 235.0 lb

## 2012-07-24 DIAGNOSIS — Z933 Colostomy status: Secondary | ICD-10-CM

## 2012-07-24 NOTE — Progress Notes (Signed)
Patient ID: Nicholas [name], male   DOB: 10/10/1941, 71 y.o.   MRN: [id number]  No chief complaint on file.   HPI Nicholas [name] is a 71 y.o. male.   HPI  He is here now to discuss colostomy closure. He has been doing well and has is normal energy level. He is not having any problems with the colostomy.  Past Medical History  Diagnosis Date  . Diabetes mellitus     boarderline  . OSA on CPAP   . BPH (benign prostatic hypertrophy)   . Neuropathy     Past Surgical History  Procedure Date  . Cholecystectomy   . Laparotomy 12/23/2011    Procedure: EXPLORATORY LAPAROTOMY;  Surgeon: Adolph Pollack, MD;  Location: [facility];  Service: General;  Laterality: N/A;  . Appendectomy 12/23/2011    Procedure: APPENDECTOMY;  Surgeon: Adolph Pollack, MD;  Location: [facility];  Service: General;  Laterality: N/A;  . Colostomy revision 12/23/2011    Procedure: COLON RESECTION SIGMOID;  Surgeon: Adolph Pollack, MD;  Location: [facility];  Service: General;  Laterality: N/AGertie Gowda procedure     Family History  Problem Relation Age of Onset  . Diabetes Mother   . Cancer Mother     skin  . Cancer Father     Leukemia  . Colon cancer Neg Hx     Social History History  Substance Use Topics  . Smoking status: Former Games developer  . Smokeless tobacco: Never Used  . Alcohol Use: No    Allergies  Allergen Reactions  . Sudafed (Pseudoephedrine Hcl)     "makes me feel drunk"    Current Outpatient Prescriptions  Medication Sig Dispense Refill  . acetaminophen (TYLENOL) 500 MG tablet Take 1,000 mg by mouth every 6 (six) hours as needed. For pain.      Marland Kitchen aspirin EC 81 MG tablet Take 81 mg by mouth daily.      . Eszopiclone (ESZOPICLONE) 3 MG TABS Take 1 tablet (3 mg total) by mouth at bedtime. Take immediately before bedtime  30 tablet  0  . HYDROcodone-acetaminophen (NORCO/VICODIN) 5-325 MG per tablet TAKE 1 TABLET BY MOUTH EVERY 6 HOURS AS NEEDED FOR PAIN  30 tablet  1  . metformin (FORTAMET) 500 MG  (OSM) 24 hr tablet Take 1 tablet (500 mg total) by mouth daily.  30 tablet  6  . Tamsulosin HCl (FLOMAX) 0.4 MG CAPS Take 0.8 mg by mouth at bedtime.       . traMADol (ULTRAM) 50 MG tablet TAKE 1 TABLET BY MOUTH EVERY 6 HOURS AS NEEDED FOR PAIN  180 tablet  3  . DISCONTD: hydrochlorothiazide (MICROZIDE) 12.5 MG capsule Take 1 capsule (12.5 mg total) by mouth daily.  30 capsule  0    Review of Systems Review of Systems  Constitutional: Negative.   Respiratory: Negative.   Cardiovascular: Negative.   Gastrointestinal: Negative.     Blood pressure 130/70, pulse 74, temperature 98.1 F (36.7 C), resp. rate 14, height 6' (1.829 m), weight 235 lb (106.595 kg).  Physical Exam Physical Exam  Constitutional: No distress.       Overweight male  HENT:  Head: Normocephalic and atraumatic.  Eyes: EOM are normal. No scleral icterus.  Neck: Neck supple.  Cardiovascular: Normal rate and regular rhythm.   Pulmonary/Chest: Effort normal and breath sounds normal.  Abdominal: Soft. He exhibits no mass. There is no tenderness.       Left midabdominal colostomy draining  stool. Midline scar that is intact. Right subcostal scar.  Lymphadenopathy:    He has no cervical adenopathy.    Data Reviewed Old note  Assessment    Colostomy status. He is interested in colostomy closure.    Plan    Laparoscopic assisted colostomy closure. He does not want any blood products even if it is a life and death situation.  I have explained the procedure and risks.  Risks include but are not limited to bleeding, infection, wound problems, anesthesia, anastomotic leak, need for colostomy, injury to intraabominal organs (such as intestine, spleen, kidney, bladder, ureter, etc.), ileus, irregular bowel habits.  He seems to understand and agrees to proceed.       Giorgi Debruin J 07/24/2012, 5:31 PM

## 2012-07-24 NOTE — Patient Instructions (Signed)
[facility]  ONE-DAY (1) PRE-OP HOME COLON PREP INSTRUCTIONS: ** MIRALAX / GATORADE PREP **  Fill the two prescriptions at a pharmacy of your choice.  You must follow the instructions below carefully.  If you have questions or problems, please call and speak to someone in the clinic department at our office:   [phone].  MIRALAX - GATORADE -- DULCOLAX TABS:   Fill the prescriptions for MIRALAX  (255 gm bottle)    In addition, purchase four (4) DULCOLAX TABLETS (no prescription required), and one 64 oz GATORADE.  (Do NOT purchase red Gatorade; any other flavor is acceptable).  ANITIBIOTICS:   There will be 2 different antibiotics.     Take both prescriptions THE AFTERNOON BEFORE your surgery, at the times written on the bottles.  INSTRUCTIONS: 1. Five days prior to your procedure do not eat nuts, popcorn, or fruit with seeds.  Stop all fiber supplements such as Metamucil, Citrucel, etc.  2. The day before your procedure: o 6:00am:  take (4) Dulcolax tablets.  You should remain on clear liquids for the entire day.   CLEAR LIQUIDS: clear bouillon, broth, jello (NOT RED), black coffee, tea, soda, etc o 10:00am:  add the bottle of MiraLax to the 64-oz bottle of Gatorade, and dissolve.  Begin drinking the Gatorade mixture until gone (8 oz every 15-30 minutes).  Continue clear liquids until midnight (or bedtime). o Take the antibiotics at the times instructed on the bottles.  3. The day of your procedure:   Do not eat or drink ANYTHING after midnight before your surgery.     If you take Heart or Blood Pressure medicine, ask the pre-op nurses about these during your preop appointment.   Further pre-operative instructions will be given to you from the hospital.   Expect to be contacted 5-7 days before your surgery.       

## 2012-08-14 ENCOUNTER — Telehealth: Payer: Self-pay | Admitting: *Deleted

## 2012-08-14 ENCOUNTER — Ambulatory Visit: Payer: Medicare Other

## 2012-08-14 NOTE — Telephone Encounter (Signed)
Pt's wife is calling to schedule a flu shot and pneumovax.  Ok for him to get the pneumovax?

## 2012-08-14 NOTE — Telephone Encounter (Signed)
Nurse appt scheduled

## 2012-08-14 NOTE — Telephone Encounter (Signed)
Yes ok to give

## 2012-08-15 ENCOUNTER — Ambulatory Visit: Payer: Medicare Other

## 2012-08-16 ENCOUNTER — Inpatient Hospital Stay: Admit: 2012-08-16 | Payer: Self-pay | Admitting: General Surgery

## 2012-08-16 SURGERY — CLOSURE, COLOSTOMY, LAPAROSCOPIC
Anesthesia: General

## 2012-09-05 ENCOUNTER — Encounter: Payer: Self-pay | Admitting: Family Medicine

## 2012-09-05 ENCOUNTER — Ambulatory Visit (INDEPENDENT_AMBULATORY_CARE_PROVIDER_SITE_OTHER): Payer: Medicare Other | Admitting: Family Medicine

## 2012-09-05 VITALS — BP 120/72 | HR 88 | Temp 97.9°F | Wt 240.0 lb

## 2012-09-05 DIAGNOSIS — R230 Cyanosis: Secondary | ICD-10-CM | POA: Diagnosis not present

## 2012-09-05 DIAGNOSIS — Z23 Encounter for immunization: Secondary | ICD-10-CM | POA: Diagnosis not present

## 2012-09-05 DIAGNOSIS — L819 Disorder of pigmentation, unspecified: Secondary | ICD-10-CM

## 2012-09-05 DIAGNOSIS — E1140 Type 2 diabetes mellitus with diabetic neuropathy, unspecified: Secondary | ICD-10-CM

## 2012-09-05 NOTE — Progress Notes (Signed)
71 yo here for: Blue discoloration in right toes.  He has a h/o diabetic neuropathy with minimal feeling in his toes. Lab Results  Component Value Date   HGBA1C 5.8* 11/18/2011   Past two months, he noticed that his toes are getting progressively "blue." Initially was only right 2nd toe and now has progressed to involve right 3rd toe as well.  He denies any known trauma.  He is unsure if his toes feel cold.  Has tried gabapentin in past for neuropathy.  Did not like side effects and did not feel it was helpful.  On Tramadol now which he feel is more effective.  Scheduled to have colostomy take down on 09/27/2012.  Patient Active Problem List  Diagnosis  . HYPERLIPIDEMIA  . SLEEP APNEA, OBSTRUCTIVE  . CHRONIC PAIN DUE TO TRAUMA  . OTHER ACUTE SINUSITIS  . DIABETES MELLITUS, BORDERLINE  . BENIGN PROSTATIC HYPERTROPHY, MILD, HX OF  . MORTON'S NEUROMA, RIGHT  . METATARSALGIA  . TIBIALIS TENDINITIS  . PES PLANUS  . HALLUX RIGIDUS, ACQUIRED  . OTHER CONGENITAL VALGUS DEFORMITY OF FEET  . PERIPHERAL NEUROPATHY  . Diabetic neuropathy  . Knee MCL sprain  . Knee pain  . Lower extremity edema  . Diverticulitis of colon with perforation  . Colostomy care  . Discoloration of skin of toe   Past Medical History  Diagnosis Date  . Diabetes mellitus     boarderline  . OSA on CPAP   . BPH (benign prostatic hypertrophy)   . Neuropathy    Past Surgical History  Procedure Date  . Cholecystectomy   . Laparotomy 12/23/2011    Procedure: EXPLORATORY LAPAROTOMY;  Surgeon: Adolph Pollack, MD;  Location: [facility];  Service: General;  Laterality: N/A;  . Appendectomy 12/23/2011    Procedure: APPENDECTOMY;  Surgeon: Adolph Pollack, MD;  Location: [facility];  Service: General;  Laterality: N/A;  . Colostomy revision 12/23/2011    Procedure: COLON RESECTION SIGMOID;  Surgeon: Adolph Pollack, MD;  Location: [facility];  Service: General;  Laterality: N/AGertie Gowda procedure    History    Substance Use Topics  . Smoking status: Former Games developer  . Smokeless tobacco: Never Used  . Alcohol Use: No   Family History  Problem Relation Age of Onset  . Diabetes Mother   . Cancer Mother     skin  . Cancer Father     Leukemia  . Colon cancer Neg Hx    Allergies  Allergen Reactions  . Sudafed (Pseudoephedrine Hcl)     "makes me feel drunk"   Current Outpatient Prescriptions on File Prior to Visit  Medication Sig Dispense Refill  . acetaminophen (TYLENOL) 500 MG tablet Take 1,000 mg by mouth every 6 (six) hours as needed. For pain.      Marland Kitchen aspirin EC 81 MG tablet Take 81 mg by mouth daily.      . Eszopiclone (ESZOPICLONE) 3 MG TABS Take 1 tablet (3 mg total) by mouth at bedtime. Take immediately before bedtime  30 tablet  0  . HYDROcodone-acetaminophen (NORCO/VICODIN) 5-325 MG per tablet TAKE 1 TABLET BY MOUTH EVERY 6 HOURS AS NEEDED FOR PAIN  30 tablet  1  . metformin (FORTAMET) 500 MG (OSM) 24 hr tablet Take 1 tablet (500 mg total) by mouth daily.  30 tablet  6  . Tamsulosin HCl (FLOMAX) 0.4 MG CAPS Take 0.8 mg by mouth at bedtime.       . traMADol (ULTRAM) 50 MG  tablet TAKE 1 TABLET BY MOUTH EVERY 6 HOURS AS NEEDED FOR PAIN  180 tablet  3  . [DISCONTINUED] hydrochlorothiazide (MICROZIDE) 12.5 MG capsule Take 1 capsule (12.5 mg total) by mouth daily.  30 capsule  0   The PMH, PSH, Social History, Family History, Medications, and allergies have been reviewed in [facility], and have been updated if relevant.   REVIEW OF SYSTEMS  GEN: No systemic complaints, no fevers, chills, sweats, or other acute illnesses   Physical Exam  General: GEN: Well-developed,well-nourished,in no acute distress; alert,appropriate and cooperative throughout examination  HEENT: Normocephalic and atraumatic without obvious abnormalities. No apparent alopecia or balding. Ears, externally no deformities  PULM: Breathing comfortably in no respiratory distress  EXT:  Right foot- 2nd and third toe (and toe  nails) with bluish discoloration, warm, pedal pulses mildly dimished but equal  PSYCH: Normally interactive. Cooperative during the interview. Pleasant. Friendly and conversant. Not anxious or depressed appearing. Normal, full affect.   Impression & Recommendations:    1. Need for prophylactic vaccination and inoculation against influenza  Flu vaccine greater than or equal to 3yo preservative free IM  2. Blue toes  New and per pt, progressive.  Since he does have decreased feeling in his toes, I question if he had a trauma to his toes that he did not feel.  Given that he is unaware of any trauma, will order LE dopplers to rule out thromboemolism and to assess blood flow. The patient indicates understanding of these issues and agrees with the plan.  Lower Extremity Arterial Doppler Right  3. Immunization due  Pneumococcal polysaccharide vaccine 23-valent greater than or equal to 2yo subcutaneous/IM

## 2012-09-05 NOTE — Patient Instructions (Addendum)
Good to see you. Have a Happy Thanksgiving. Please stop by to see Nicholas [name] on your way out. 

## 2012-09-10 ENCOUNTER — Encounter (INDEPENDENT_AMBULATORY_CARE_PROVIDER_SITE_OTHER): Payer: Medicare Other

## 2012-09-10 DIAGNOSIS — I739 Peripheral vascular disease, unspecified: Secondary | ICD-10-CM

## 2012-09-10 DIAGNOSIS — R230 Cyanosis: Secondary | ICD-10-CM

## 2012-09-13 ENCOUNTER — Ambulatory Visit (INDEPENDENT_AMBULATORY_CARE_PROVIDER_SITE_OTHER): Payer: Medicare Other | Admitting: Family Medicine

## 2012-09-13 ENCOUNTER — Encounter: Payer: Self-pay | Admitting: Family Medicine

## 2012-09-13 VITALS — BP 122/78 | HR 84 | Temp 98.0°F | Wt 240.0 lb

## 2012-09-13 DIAGNOSIS — Z23 Encounter for immunization: Secondary | ICD-10-CM

## 2012-09-13 DIAGNOSIS — I999 Unspecified disorder of circulatory system: Secondary | ICD-10-CM | POA: Diagnosis not present

## 2012-09-13 DIAGNOSIS — L8990 Pressure ulcer of unspecified site, unspecified stage: Secondary | ICD-10-CM

## 2012-09-13 MED ORDER — SULFAMETHOXAZOLE-TRIMETHOPRIM 800-160 MG PO TABS: 1.0000 | ORAL_TABLET | Freq: Two times a day (BID) | ORAL | Status: DC

## 2012-09-13 NOTE — Patient Instructions (Addendum)
Good to see you. Please stop by to see Shirlee Limerick on your way out.  Take Bactrim DS as directed- 1 tablet twice daily for 10 days.  Please pick up Tegaderm from pharmacy once you run out.    Pressure Ulcer A pressure ulcer is a sore where the skin breaks down and exposes deeper layers of tissue. It develops in areas of the body where there is unrelieved pressure. Pressure ulcers are usually found over a boney area, such as the shoulder blades, spine, lower back, hips, knees, ankles, and heels. CAUSES   Decreased ability to move.  Decreased ability to feel pain or discomfort.  Moisture from urine or stool.  Poor nutrition.  Pulling sheets that are under a patient when changing his or her position. DIAGNOSIS  Your caregiver will diagnose your pressure ulcer based on its appearance. Your caregiver may determine the stage of your pressure ulcer as well. Your caregiver may request tests to check for infection, assess your circulation, or to check for other diseases, such as diabetes. TREATMENT  Treatment of your pressure ulcer begins with determining what stage the ulcer is in. Your treatment team may include your caregiver, a wound care specialist, a nutritionist, a physical therapist, and a Careers adviser. Treatments include:   Moving or repositioning every 1 to 2 hours.  Using beds or mattresses to shift your body weight and pressure points frequently.  Improving your diet.  Cleaning and bandaging (dressing) the open wound.  Giving antibiotic medicines.  Removing damaged tissue.  Surgery and sometimes skin grafts. HOME CARE INSTRUCTIONS  Follow the care plan that was started in the hospital.  Avoid staying in the same position for more than 2 hours. Use padding, devices, or mattresses to cushion your pressure points as directed by your caregiver.  Eat well. Take nutritional supplements and vitamins as directed by your caregiver.  Keep all follow-up appointments.  Take pain medicine  as directed by your caregiver. SEEK MEDICAL CARE IF:   Your pressure ulcer is not improving.  You do not know how to care for your pressure ulcer.  You notice other areas of redness on your skin. SEEK IMMEDIATE MEDICAL CARE IF:   You have increasing redness, swelling, or pain in your pressure ulcer.  You notice pus, or increased pus, coming from your pressure ulcer.  You have a fever.  You notice a bad smell coming from the wound or dressing.  Your pressure ulcer opens up again. MAKE SURE YOU:   Understand these instructions.  Will watch your condition.  Will get help right away if you are not doing well or get worse. Document Released: 09/26/2005 Document Revised: 12/19/2011 Document Reviewed: 05/13/2011 [facility] Patient Information 2013 [place], [place].

## 2012-09-13 NOTE — Progress Notes (Addendum)
71 yo here for- ?infection of right foot.  Saw him last week for blue discoloration in right toes.   LE arterial dopplers showed now evidence of arterial disease bilateral, normal ABIs bilaterally, normal right great toe pressure with ABNORMAL left great toe pressure.  He has a h/o diabetic neuropathy with minimal feeling in his toes. Lab Results  Component Value Date   HGBA1C 5.8* 11/18/2011   Scheduled to have colostomy take down on 09/27/2012.  About a week ago, started having pain in left 2nd toe and noticed blood in his sock.  Patient Active Problem List  Diagnosis  . HYPERLIPIDEMIA  . SLEEP APNEA, OBSTRUCTIVE  . CHRONIC PAIN DUE TO TRAUMA  . OTHER ACUTE SINUSITIS  . DIABETES MELLITUS, BORDERLINE  . BENIGN PROSTATIC HYPERTROPHY, MILD, HX OF  . MORTON'S NEUROMA, RIGHT  . METATARSALGIA  . TIBIALIS TENDINITIS  . PES PLANUS  . HALLUX RIGIDUS, ACQUIRED  . OTHER CONGENITAL VALGUS DEFORMITY OF FEET  . PERIPHERAL NEUROPATHY  . Diabetic neuropathy  . Knee MCL sprain  . Knee pain  . Lower extremity edema  . Diverticulitis of colon with perforation  . Colostomy care  . Discoloration of skin of toe   Past Medical History  Diagnosis Date  . Diabetes mellitus     boarderline  . OSA on CPAP   . BPH (benign prostatic hypertrophy)   . Neuropathy    Past Surgical History  Procedure Date  . Cholecystectomy   . Laparotomy 12/23/2011    Procedure: EXPLORATORY LAPAROTOMY;  Surgeon: Adolph Pollack, MD;  Location: [facility];  Service: General;  Laterality: N/A;  . Appendectomy 12/23/2011    Procedure: APPENDECTOMY;  Surgeon: Adolph Pollack, MD;  Location: [facility];  Service: General;  Laterality: N/A;  . Colostomy revision 12/23/2011    Procedure: COLON RESECTION SIGMOID;  Surgeon: Adolph Pollack, MD;  Location: [facility];  Service: General;  Laterality: N/AGertie Gowda procedure    History  Substance Use Topics  . Smoking status: Former Games developer  . Smokeless tobacco: Never Used   . Alcohol Use: No   Family History  Problem Relation Age of Onset  . Diabetes Mother   . Cancer Mother     skin  . Cancer Father     Leukemia  . Colon cancer Neg Hx    Allergies  Allergen Reactions  . Sudafed (Pseudoephedrine Hcl)     "makes me feel drunk"   Current Outpatient Prescriptions on File Prior to Visit  Medication Sig Dispense Refill  . acetaminophen (TYLENOL) 500 MG tablet Take 1,000 mg by mouth every 6 (six) hours as needed. For pain.      Marland Kitchen aspirin EC 81 MG tablet Take 81 mg by mouth daily.      . Eszopiclone (ESZOPICLONE) 3 MG TABS Take 1 tablet (3 mg total) by mouth at bedtime. Take immediately before bedtime  30 tablet  0  . HYDROcodone-acetaminophen (NORCO/VICODIN) 5-325 MG per tablet TAKE 1 TABLET BY MOUTH EVERY 6 HOURS AS NEEDED FOR PAIN  30 tablet  1  . metformin (FORTAMET) 500 MG (OSM) 24 hr tablet Take 1 tablet (500 mg total) by mouth daily.  30 tablet  6  . Tamsulosin HCl (FLOMAX) 0.4 MG CAPS Take 0.8 mg by mouth at bedtime.       . traMADol (ULTRAM) 50 MG tablet TAKE 1 TABLET BY MOUTH EVERY 6 HOURS AS NEEDED FOR PAIN  180 tablet  3  . [DISCONTINUED] hydrochlorothiazide (MICROZIDE)  12.5 MG capsule Take 1 capsule (12.5 mg total) by mouth daily.  30 capsule  0   The PMH, PSH, Social History, Family History, Medications, and allergies have been reviewed in [facility], and have been updated if relevant.   REVIEW OF SYSTEMS  GEN: No systemic complaints, no fevers, chills, sweats, or other acute illnesses   Physical Exam  BP 122/78  Pulse 84  Temp 98 F (36.7 C)  Wt 240 lb (108.863 kg)  General: GEN: Well-developed,well-nourished,in no acute distress; alert,appropriate and cooperative throughout examination  HEENT: Normocephalic and atraumatic without obvious abnormalities. No apparent alopecia or balding. Ears, externally no deformities  PULM: Breathing comfortably in no respiratory distress  EXT:   Left foot-  Mildly decreased pedal pulses (equal). Stage  2 pressure ulcer 2 cm x 3 cm under second toe PSYCH: Normally interactive. Cooperative during the interview. Pleasant. Friendly and conversant. Not anxious or depressed appearing. Normal, full affect.   Impression & Recommendations:    1. Vascular abnormality  Ambulatory referral to Vascular Surgery   Will refer to vascular for further evaluation.  2.  Pressure ulcer- Given that is a diabetic, will treat with Bactrim DS 1 tablet twice daily x 10 days.  Applied tegaderm and discussed how to change dressing and proper care. The patient indicates understanding of these issues and agrees with the plan.

## 2012-09-13 NOTE — Addendum Note (Signed)
Addended by: Eliezer Bottom on: 09/13/2012 01:11 PM   Modules accepted: Orders

## 2012-09-17 ENCOUNTER — Ambulatory Visit: Payer: Medicare Other | Admitting: Cardiovascular Disease

## 2012-09-19 ENCOUNTER — Encounter (HOSPITAL_COMMUNITY): Payer: Self-pay | Admitting: Pharmacy Technician

## 2012-09-20 ENCOUNTER — Telehealth (INDEPENDENT_AMBULATORY_CARE_PROVIDER_SITE_OTHER): Payer: Self-pay | Admitting: General Surgery

## 2012-09-20 ENCOUNTER — Encounter: Payer: Self-pay | Admitting: Cardiovascular Disease

## 2012-09-20 ENCOUNTER — Ambulatory Visit (INDEPENDENT_AMBULATORY_CARE_PROVIDER_SITE_OTHER): Payer: Medicare Other | Admitting: Cardiovascular Disease

## 2012-09-20 VITALS — BP 120/68 | HR 66 | Ht 72.0 in | Wt 242.8 lb

## 2012-09-20 DIAGNOSIS — E1149 Type 2 diabetes mellitus with other diabetic neurological complication: Secondary | ICD-10-CM

## 2012-09-20 DIAGNOSIS — R0602 Shortness of breath: Secondary | ICD-10-CM

## 2012-09-20 DIAGNOSIS — E1142 Type 2 diabetes mellitus with diabetic polyneuropathy: Secondary | ICD-10-CM

## 2012-09-20 DIAGNOSIS — E1140 Type 2 diabetes mellitus with diabetic neuropathy, unspecified: Secondary | ICD-10-CM

## 2012-09-20 DIAGNOSIS — R011 Cardiac murmur, unspecified: Secondary | ICD-10-CM

## 2012-09-20 NOTE — Telephone Encounter (Signed)
Pt's wife was recommended to call and alert Dr. Abbey Chatters of new development.  Pt is NIDDM and has new onset discoloration of toes on Lt foot.  Dr. Dayton Martes (PCP) aware and working him up, with referral to vascular.  All notes in Epic.

## 2012-09-20 NOTE — Assessment & Plan Note (Signed)
The patient has symptoms of diabetic neuropathy. The discoloration involving the second and third right toes has resolved completely. Most likely was trauma related. No evidence of peripheral arterial disease on that side. His pulses are normal with normal toe pressure. On the left side, the ABI was normal but the toe pressure was mildly decreased. His dorsalis pedis pulse is mildly diminished and he likely has some atherosclerosis there. However, he has no symptoms of claudication or nonhealing ulcers. Thus, I recommend continuing medical therapy of his diabetes as well as daily aspirin.

## 2012-09-20 NOTE — Assessment & Plan Note (Signed)
He does have a cardiac murmur of an aortic etiology which does not seem to be severe. I will obtain an echocardiogram for evaluation.

## 2012-09-20 NOTE — Progress Notes (Signed)
HPI  This is a pleasant 71 year old male who was referred by Dr.Aron for evaluation of possible peripheral arterial disease. The patient is a previous smoker and has known history of type 2 diabetes. He has no previous cardiac history. Few months ago, he had bluish discoloration under the nail beds of the second and third toes on the right side. There might have been a trauma. That has resolved spontaneously completely. He had a recent blister on the second left toe and that's also healing. He has no claudication or rest pain. He has chronic numbness related to diabetic neuropathy .He underwent ABI which was normal bilaterally. However, the toe pressure was mildly decreased on the left side. He has no history of aortic aneurysm. He reports colon rupture early this year and he has a colostomy bag. He denies any chest pain. He gets dyspnea if he overexerts himself. No orthopnea or PND.  Allergies  Allergen Reactions  . Sudafed (Pseudoephedrine Hcl)     "makes me feel drunk"     Current Outpatient Prescriptions on File Prior to Visit  Medication Sig Dispense Refill  . acetaminophen (TYLENOL) 500 MG tablet Take 1,000 mg by mouth every 6 (six) hours as needed. For pain.      Marland Kitchen erythromycin base (E-MYCIN) 500 MG tablet Take 500 mg by mouth 3 (three) times daily. For one day      . Eszopiclone (ESZOPICLONE) 3 MG TABS Take 1 tablet (3 mg total) by mouth at bedtime. Take immediately before bedtime  30 tablet  0  . metformin (FORTAMET) 500 MG (OSM) 24 hr tablet Take 1 tablet (500 mg total) by mouth daily.  30 tablet  6  . naproxen sodium (ANAPROX) 220 MG tablet Take 220 mg by mouth 2 (two) times daily as needed. For pain      . sulfamethoxazole-trimethoprim (BACTRIM DS,SEPTRA DS) 800-160 MG per tablet Take 1 tablet by mouth 2 (two) times daily.  20 tablet  0  . Tamsulosin HCl (FLOMAX) 0.4 MG CAPS Take 0.8 mg by mouth at bedtime.       Marland Kitchen aspirin EC 81 MG tablet Take 81 mg by mouth daily.      .  [DISCONTINUED] hydrochlorothiazide (MICROZIDE) 12.5 MG capsule Take 1 capsule (12.5 mg total) by mouth daily.  30 capsule  0     Past Medical History  Diagnosis Date  . Diabetes mellitus     boarderline  . OSA on CPAP   . BPH (benign prostatic hypertrophy)   . Neuropathy      Past Surgical History  Procedure Date  . Cholecystectomy   . Laparotomy 12/23/2011    Procedure: EXPLORATORY LAPAROTOMY;  Surgeon: Adolph Pollack, MD;  Location: [facility];  Service: General;  Laterality: N/A;  . Appendectomy 12/23/2011    Procedure: APPENDECTOMY;  Surgeon: Adolph Pollack, MD;  Location: [facility];  Service: General;  Laterality: N/A;  . Colostomy revision 12/23/2011    Procedure: COLON RESECTION SIGMOID;  Surgeon: Adolph Pollack, MD;  Location: [facility];  Service: General;  Laterality: N/AGertie Gowda procedure   . Cardiac catheterization     [facility]     Family History  Problem Relation Age of Onset  . Diabetes Mother   . Cancer Mother     skin  . Heart disease Mother   . Heart attack Mother   . Cancer Father     Leukemia  . Colon cancer Neg Hx  History   Social History  . Marital Status: Married    Spouse Name: N/A    Number of Children: N/A  . Years of Education: N/A   Occupational History  . Games developer    Social History Main Topics  . Smoking status: Former Smoker -- 2.0 packs/day for 40 years    Types: Cigarettes  . Smokeless tobacco: Never Used  . Alcohol Use: No  . Drug Use: No  . Sexually Active:    Other Topics Concern  . Not on file   Social History Narrative  . No narrative on file     ROS Constitutional: Negative for fever, chills, diaphoresis, activity change, appetite change and fatigue.  HENT: Negative for hearing loss, nosebleeds, congestion, sore throat, facial swelling, drooling, trouble swallowing, neck pain, voice change, sinus pressure and tinnitus.  Eyes: Negative for photophobia, pain, discharge and visual disturbance.    Respiratory: Negative for apnea, cough, chest tightness and wheezing.  Cardiovascular: Negative for chest pain, palpitations and leg swelling.  Gastrointestinal: Negative for nausea, vomiting, abdominal pain, diarrhea, constipation, blood in stool and abdominal distention.  Genitourinary: Negative for dysuria, urgency, frequency, hematuria and decreased urine volume.  Musculoskeletal: Negative for myalgias, back pain, joint swelling, arthralgias and gait problem.  Skin: Negative for color change, pallor, rash and wound.  Neurological: Negative for dizziness, tremors, seizures, syncope, speech difficulty, weakness, light-headedness, numbness and headaches.  Psychiatric/Behavioral: Negative for suicidal ideas, hallucinations, behavioral problems and agitation. The patient is not nervous/anxious.     PHYSICAL EXAM   BP 120/68  Pulse 66  Ht 6' (1.829 m)  Wt 242 lb 12 oz (110.111 kg)  BMI 32.92 kg/m2 Constitutional: He is oriented to person, place, and time. He appears well-developed and well-nourished. No distress.  HENT: No nasal discharge.  Head: Normocephalic and atraumatic.  Eyes: Pupils are equal and round. Right eye exhibits no discharge. Left eye exhibits no discharge.  Neck: Normal range of motion. Neck supple. No JVD present. No thyromegaly present. No carotid bruits.  Cardiovascular: Normal rate, regular rhythm, normal heart sounds and. Exam reveals no gallop and no friction rub. There is a 2/6 systolic ejection murmur which is early peaking at the aortic area and left sternal border. Pulmonary/Chest: Effort normal and breath sounds normal. No stridor. No respiratory distress. He has no wheezes. He has no rales. He exhibits no tenderness.  Abdominal: Soft. Bowel sounds are normal. He exhibits no distension. There is no tenderness. There is no rebound and no guarding.  Musculoskeletal: Normal range of motion. He exhibits no edema and no tenderness.  Neurological: He is alert and  oriented to person, place, and time. Coordination normal.  Skin: Skin is warm and dry. No rash noted. He is not diaphoretic. No erythema. No pallor.  Psychiatric: He has a normal mood and affect. His behavior is normal. Judgment and thought content normal.  Vascular: Normal femoral pulses. Posterior tibial: Normal on both sides. Dorsalis pedis: Normal on the right side and slightly diminished on the left side.     HQI:ONGEX  Rhythm  -Prominent R(V1) -nonspecific.   -  Nonspecific T-abnormality.   ABNORMAL    ASSESSMENT AND PLAN

## 2012-09-20 NOTE — Patient Instructions (Signed)
20 JACEON HEIBERGER  09/20/2012   Your procedure is scheduled on: 09/27/12   Report to [facility] at 0900 AM.  Call this number if you have problems the morning of surgery: [phone]   Remember:   Do not eat food:After Midnight.  May have clear liquids:until Midnight .    Take these medicines the morning of surgery with A SIP OF WATER:   Do not wear jewelry, .  Do not wear lotions, powders, or perfumes.  . Men may shave face and neck.  Do not bring valuables to the hospital.  Contacts, dentures or bridgework may not be worn into surgery.  Leave suitcase in the car. After surgery it may be brought to your room.  For patients admitted to the hospital, checkout time is 11:00 AM the day of discharge.              SEE CHG INSTRUCTION SHEET    Please read over the following fact sheets that you were given: MRSA Information, coughing and deep breathing exercisese, leg exercises, Blood Transfusion Fact Sheet               Failure to comply with these instructions may result in cancellation of your surgery.              Patient Signature ________________________________________________________             Nurse Signature ________________________________________________________

## 2012-09-20 NOTE — Patient Instructions (Addendum)
Your physician has requested that you have an echocardiogram. Echocardiography is a painless test that uses sound waves to create images of your heart. It provides your doctor with information about the size and shape of your heart and how well your heart's chambers and valves are working. This procedure takes approximately one hour. There are no restrictions for this procedure.  Follow up as needed 

## 2012-09-21 ENCOUNTER — Encounter (HOSPITAL_COMMUNITY): Payer: Self-pay

## 2012-09-21 ENCOUNTER — Encounter (HOSPITAL_COMMUNITY)
Admission: RE | Admit: 2012-09-21 | Discharge: 2012-09-21 | Disposition: A | Discharge status: Home / Self Care | Payer: Medicare Other | Source: Ambulatory Visit | Attending: General Surgery | Admitting: General Surgery

## 2012-09-21 HISTORY — DX: Reserved for concepts with insufficient information to code with codable children: IMO0002

## 2012-09-21 HISTORY — DX: Cardiac murmur, unspecified: R01.1

## 2012-09-21 HISTORY — DX: Unspecified osteoarthritis, unspecified site: M19.90

## 2012-09-21 HISTORY — DX: Chronic sinusitis, unspecified: J32.9

## 2012-09-21 LAB — COMPREHENSIVE METABOLIC PANEL
Albumin: 3.7 g/dL (ref 3.5–5.2)
BUN: 17 mg/dL (ref 6–23)
Calcium: 9.6 mg/dL (ref 8.4–10.5)
Creatinine, Ser: 1.02 mg/dL (ref 0.50–1.35)
Potassium: 4.4 mEq/L (ref 3.5–5.1)
Total Protein: 7.1 g/dL (ref 6.0–8.3)

## 2012-09-21 LAB — CBC WITH DIFFERENTIAL/PLATELET
Basophils Relative: 0 % (ref 0–1)
Eosinophils Absolute: 0.1 10*3/uL (ref 0.0–0.7)
Hemoglobin: 14.2 g/dL (ref 13.0–17.0)
MCH: 32 pg (ref 26.0–34.0)
MCHC: 36.4 g/dL — ABNORMAL HIGH (ref 30.0–36.0)
Monocytes Absolute: 0.8 10*3/uL (ref 0.1–1.0)
Monocytes Relative: 16 % — ABNORMAL HIGH (ref 3–12)
Neutrophils Relative %: 61 % (ref 43–77)

## 2012-09-21 LAB — SURGICAL PCR SCREEN
MRSA, PCR: NEGATIVE
Staphylococcus aureus: NEGATIVE

## 2012-09-21 LAB — PROTIME-INR
INR: 1.01 (ref 0.00–1.49)
Prothrombin Time: 13.2 seconds (ref 11.6–15.2)

## 2012-09-26 ENCOUNTER — Institutional Professional Consult (permissible substitution): Payer: Medicare Other | Admitting: Cardiovascular Disease

## 2012-09-27 ENCOUNTER — Encounter (HOSPITAL_COMMUNITY): Payer: Self-pay | Admitting: Anesthesiology

## 2012-09-27 ENCOUNTER — Inpatient Hospital Stay (HOSPITAL_COMMUNITY)
Admission: RE | Admit: 2012-09-27 | Discharge: 2012-10-05 | DRG: 330 | Disposition: A | Discharge status: Home / Self Care | Payer: Medicare Other | Source: Ambulatory Visit | Attending: General Surgery | Admitting: General Surgery

## 2012-09-27 ENCOUNTER — Encounter (HOSPITAL_COMMUNITY): Admission: RE | Disposition: A | Discharge status: Home / Self Care | Payer: Self-pay | Source: Ambulatory Visit

## 2012-09-27 ENCOUNTER — Encounter (HOSPITAL_COMMUNITY): Payer: Self-pay | Admitting: Surgery

## 2012-09-27 ENCOUNTER — Inpatient Hospital Stay (HOSPITAL_COMMUNITY): Payer: Medicare Other | Admitting: Anesthesiology

## 2012-09-27 ENCOUNTER — Encounter (HOSPITAL_COMMUNITY): Payer: Self-pay

## 2012-09-27 DIAGNOSIS — K5289 Other specified noninfective gastroenteritis and colitis: Secondary | ICD-10-CM | POA: Diagnosis not present

## 2012-09-27 DIAGNOSIS — D62 Acute posthemorrhagic anemia: Secondary | ICD-10-CM | POA: Diagnosis not present

## 2012-09-27 DIAGNOSIS — K560 Paralytic ileus: Secondary | ICD-10-CM | POA: Diagnosis not present

## 2012-09-27 DIAGNOSIS — K921 Melena: Secondary | ICD-10-CM | POA: Diagnosis not present

## 2012-09-27 DIAGNOSIS — E119 Type 2 diabetes mellitus without complications: Secondary | ICD-10-CM | POA: Diagnosis present

## 2012-09-27 DIAGNOSIS — R7309 Other abnormal glucose: Secondary | ICD-10-CM | POA: Diagnosis present

## 2012-09-27 DIAGNOSIS — K660 Peritoneal adhesions (postprocedural) (postinfection): Secondary | ICD-10-CM | POA: Diagnosis present

## 2012-09-27 DIAGNOSIS — Z01812 Encounter for preprocedural laboratory examination: Secondary | ICD-10-CM

## 2012-09-27 DIAGNOSIS — G4733 Obstructive sleep apnea (adult) (pediatric): Secondary | ICD-10-CM | POA: Diagnosis present

## 2012-09-27 DIAGNOSIS — Z433 Encounter for attention to colostomy: Principal | ICD-10-CM

## 2012-09-27 DIAGNOSIS — Z933 Colostomy status: Secondary | ICD-10-CM

## 2012-09-27 HISTORY — PX: COLOSTOMY TAKEDOWN: SHX5258

## 2012-09-27 LAB — GLUCOSE, CAPILLARY
Glucose-Capillary: 112 mg/dL — ABNORMAL HIGH (ref 70–99)
Glucose-Capillary: 138 mg/dL — ABNORMAL HIGH (ref 70–99)
Glucose-Capillary: 161 mg/dL — ABNORMAL HIGH (ref 70–99)

## 2012-09-27 SURGERY — CLOSURE, COLOSTOMY, LAPAROSCOPIC
Anesthesia: General | Site: Abdomen | Wound class: Clean Contaminated

## 2012-09-27 MED ORDER — ONDANSETRON HCL 4 MG/2ML IJ SOLN
INTRAMUSCULAR | Status: DC | PRN
  Administered 2012-09-27: 4 mg via INTRAVENOUS

## 2012-09-27 MED ORDER — PROPOFOL 10 MG/ML IV BOLUS
INTRAVENOUS | Status: DC | PRN
  Administered 2012-09-27: 200 mg via INTRAVENOUS

## 2012-09-27 MED ORDER — MORPHINE SULFATE (PF) 1 MG/ML IV SOLN
INTRAVENOUS | Status: DC
  Administered 2012-09-27: 15:00:00 via INTRAVENOUS
  Administered 2012-09-27: 6 mg via INTRAVENOUS
  Administered 2012-09-27: 13.5 mg via INTRAVENOUS
  Administered 2012-09-28: 28.43 mg via INTRAVENOUS
  Administered 2012-09-28 (×3): via INTRAVENOUS
  Administered 2012-09-28: 14.45 mg via INTRAVENOUS
  Administered 2012-09-28: 12 mg via INTRAVENOUS
  Administered 2012-09-28: 9 mg via INTRAVENOUS
  Administered 2012-09-28: 03:00:00 via INTRAVENOUS
  Administered 2012-09-28: 16.5 mg via INTRAVENOUS
  Administered 2012-09-29: 19:00:00 via INTRAVENOUS
  Administered 2012-09-29: 21.35 mg via INTRAVENOUS
  Administered 2012-09-29: 12:00:00 via INTRAVENOUS
  Administered 2012-09-29: 10.5 mg via INTRAVENOUS
  Administered 2012-09-29: 30.9 mg via INTRAVENOUS
  Administered 2012-09-29: 16 mg via INTRAVENOUS
  Administered 2012-09-29: 32.31 mg via INTRAVENOUS
  Administered 2012-09-29: 13.5 mg via INTRAVENOUS
  Administered 2012-09-30: 14.88 mg via INTRAVENOUS
  Administered 2012-09-30: 4.37 mg via INTRAVENOUS
  Administered 2012-09-30: 04:00:00 via INTRAVENOUS
  Administered 2012-09-30: 9 mg via INTRAVENOUS
  Administered 2012-09-30: 19:00:00 via INTRAVENOUS
  Administered 2012-09-30: 10.5 mg via INTRAVENOUS
  Administered 2012-09-30: 22.08 mg via INTRAVENOUS
  Administered 2012-09-30: 11:00:00 via INTRAVENOUS
  Administered 2012-09-30: 4.5 mg via INTRAVENOUS
  Administered 2012-10-01: 08:00:00 via INTRAVENOUS
  Administered 2012-10-01: 4.5 mg via INTRAVENOUS
  Filled 2012-09-27 (×12): qty 25

## 2012-09-27 MED ORDER — LACTATED RINGERS IV SOLN
INTRAVENOUS | Status: DC
  Administered 2012-09-27: 1000 mL via INTRAVENOUS

## 2012-09-27 MED ORDER — ROCURONIUM BROMIDE 100 MG/10ML IV SOLN
INTRAVENOUS | Status: DC | PRN
  Administered 2012-09-27: 10 mg via INTRAVENOUS
  Administered 2012-09-27: 20 mg via INTRAVENOUS
  Administered 2012-09-27: 10 mg via INTRAVENOUS
  Administered 2012-09-27: 50 mg via INTRAVENOUS
  Administered 2012-09-27 (×2): 10 mg via INTRAVENOUS

## 2012-09-27 MED ORDER — LACTATED RINGERS IR SOLN
Status: DC | PRN
  Administered 2012-09-27: 1000 mL

## 2012-09-27 MED ORDER — DIPHENHYDRAMINE HCL 50 MG/ML IJ SOLN: 12.5000 mg | Freq: Four times a day (QID) | INTRAMUSCULAR | Status: DC | PRN

## 2012-09-27 MED ORDER — PANTOPRAZOLE SODIUM 40 MG IV SOLR
40.0000 mg | INTRAVENOUS | Status: DC
  Administered 2012-09-27 – 2012-10-03 (×7): 40 mg via INTRAVENOUS
  Filled 2012-09-27 (×9): qty 40

## 2012-09-27 MED ORDER — ALVIMOPAN 12 MG PO CAPS
12.0000 mg | ORAL_CAPSULE | Freq: Once | ORAL | Status: AC
  Administered 2012-09-27: 12 mg via ORAL
  Filled 2012-09-27: qty 1

## 2012-09-27 MED ORDER — NEOSTIGMINE METHYLSULFATE 1 MG/ML IJ SOLN
INTRAMUSCULAR | Status: DC | PRN
  Administered 2012-09-27: 5 mg via INTRAVENOUS

## 2012-09-27 MED ORDER — HYDROMORPHONE HCL PF 1 MG/ML IJ SOLN
0.2500 mg | INTRAMUSCULAR | Status: DC | PRN
  Administered 2012-09-27 (×4): 0.5 mg via INTRAVENOUS

## 2012-09-27 MED ORDER — DIPHENHYDRAMINE HCL 12.5 MG/5ML PO ELIX
12.5000 mg | ORAL_SOLUTION | Freq: Four times a day (QID) | ORAL | Status: DC | PRN
  Filled 2012-09-27: qty 5

## 2012-09-27 MED ORDER — GLYCOPYRROLATE 0.2 MG/ML IJ SOLN
INTRAMUSCULAR | Status: DC | PRN
  Administered 2012-09-27: 0.6 mg via INTRAVENOUS

## 2012-09-27 MED ORDER — MIDAZOLAM HCL 5 MG/5ML IJ SOLN
INTRAMUSCULAR | Status: DC | PRN
  Administered 2012-09-27: 2 mg via INTRAVENOUS

## 2012-09-27 MED ORDER — BUPIVACAINE-EPINEPHRINE PF 0.5-1:200000 % IJ SOLN
INTRAMUSCULAR | Status: DC | PRN
  Administered 2012-09-27: 10 mL

## 2012-09-27 MED ORDER — ONDANSETRON HCL 4 MG/2ML IJ SOLN: 4.0000 mg | Freq: Four times a day (QID) | INTRAMUSCULAR | Status: DC | PRN

## 2012-09-27 MED ORDER — DEXTROSE 5 % IV SOLN
2.0000 g | INTRAVENOUS | Status: AC
  Administered 2012-09-27: 2 g via INTRAVENOUS
  Filled 2012-09-27: qty 2

## 2012-09-27 MED ORDER — DEXTROSE 5 % IV SOLN
1.0000 g | Freq: Four times a day (QID) | INTRAVENOUS | Status: AC
  Administered 2012-09-27 – 2012-09-28 (×3): 1 g via INTRAVENOUS
  Filled 2012-09-27 (×3): qty 1

## 2012-09-27 MED ORDER — TAMSULOSIN HCL 0.4 MG PO CAPS
0.8000 mg | ORAL_CAPSULE | Freq: Every day | ORAL | Status: DC
  Administered 2012-09-27 – 2012-10-04 (×8): 0.8 mg via ORAL
  Filled 2012-09-27 (×9): qty 2

## 2012-09-27 MED ORDER — LACTATED RINGERS IV SOLN: INTRAVENOUS | Status: DC

## 2012-09-27 MED ORDER — BUPIVACAINE-EPINEPHRINE 0.5% -1:200000 IJ SOLN
INTRAMUSCULAR | Status: AC
  Filled 2012-09-27: qty 1

## 2012-09-27 MED ORDER — MORPHINE SULFATE (PF) 1 MG/ML IV SOLN
INTRAVENOUS | Status: AC
  Administered 2012-09-27: 16.43 mg
  Filled 2012-09-27: qty 25

## 2012-09-27 MED ORDER — ONDANSETRON HCL 4 MG/2ML IJ SOLN: 4.0000 mg | INTRAMUSCULAR | Status: DC | PRN

## 2012-09-27 MED ORDER — ALVIMOPAN 12 MG PO CAPS
12.0000 mg | ORAL_CAPSULE | Freq: Two times a day (BID) | ORAL | Status: DC
  Administered 2012-09-28 – 2012-09-30 (×5): 12 mg via ORAL
  Filled 2012-09-27 (×6): qty 1

## 2012-09-27 MED ORDER — ACETAMINOPHEN 10 MG/ML IV SOLN
INTRAVENOUS | Status: DC | PRN
  Administered 2012-09-27: 1000 mg via INTRAVENOUS

## 2012-09-27 MED ORDER — INSULIN ASPART 100 UNIT/ML ~~LOC~~ SOLN
0.0000 [IU] | SUBCUTANEOUS | Status: DC
  Administered 2012-09-27 (×2): 3 [IU] via SUBCUTANEOUS
  Administered 2012-09-28 (×3): 2 [IU] via SUBCUTANEOUS
  Administered 2012-09-28: 3 [IU] via SUBCUTANEOUS
  Administered 2012-09-28 – 2012-09-29 (×2): 2 [IU] via SUBCUTANEOUS
  Administered 2012-09-29: 3 [IU] via SUBCUTANEOUS
  Administered 2012-09-29 (×2): 2 [IU] via SUBCUTANEOUS

## 2012-09-27 MED ORDER — HYDROMORPHONE HCL PF 1 MG/ML IJ SOLN
INTRAMUSCULAR | Status: AC
  Filled 2012-09-27: qty 1

## 2012-09-27 MED ORDER — SODIUM CHLORIDE 0.9 % IJ SOLN: 9.0000 mL | INTRAMUSCULAR | Status: DC | PRN

## 2012-09-27 MED ORDER — LIDOCAINE HCL (CARDIAC) 20 MG/ML IV SOLN
INTRAVENOUS | Status: DC | PRN
  Administered 2012-09-27: 50 mg via INTRAVENOUS

## 2012-09-27 MED ORDER — LACTATED RINGERS IV SOLN
INTRAVENOUS | Status: DC | PRN
  Administered 2012-09-27 (×3): via INTRAVENOUS

## 2012-09-27 MED ORDER — HEPARIN SODIUM (PORCINE) 5000 UNIT/ML IJ SOLN
5000.0000 [IU] | Freq: Three times a day (TID) | INTRAMUSCULAR | Status: DC
  Administered 2012-09-28 – 2012-09-30 (×7): 5000 [IU] via SUBCUTANEOUS
  Filled 2012-09-27 (×10): qty 1

## 2012-09-27 MED ORDER — FENTANYL CITRATE 0.05 MG/ML IJ SOLN
INTRAMUSCULAR | Status: DC | PRN
  Administered 2012-09-27 (×2): 100 ug via INTRAVENOUS
  Administered 2012-09-27 (×2): 50 ug via INTRAVENOUS
  Administered 2012-09-27 (×2): 100 ug via INTRAVENOUS

## 2012-09-27 MED ORDER — KCL IN DEXTROSE-NACL 20-5-0.9 MEQ/L-%-% IV SOLN
INTRAVENOUS | Status: DC
  Administered 2012-09-27 – 2012-10-03 (×10): via INTRAVENOUS
  Filled 2012-09-27 (×16): qty 1000

## 2012-09-27 MED ORDER — NALOXONE HCL 0.4 MG/ML IJ SOLN: 0.4000 mg | INTRAMUSCULAR | Status: DC | PRN

## 2012-09-27 MED ORDER — HYDROMORPHONE HCL PF 1 MG/ML IJ SOLN
INTRAMUSCULAR | Status: DC | PRN
  Administered 2012-09-27: 0.5 mg via INTRAVENOUS
  Administered 2012-09-27: 1 mg via INTRAVENOUS
  Administered 2012-09-27: 0.5 mg via INTRAVENOUS

## 2012-09-27 MED ORDER — PROMETHAZINE HCL 25 MG/ML IJ SOLN: 6.2500 mg | INTRAMUSCULAR | Status: DC | PRN

## 2012-09-27 MED ORDER — ONDANSETRON HCL 4 MG PO TABS: 4.0000 mg | ORAL_TABLET | Freq: Four times a day (QID) | ORAL | Status: DC | PRN

## 2012-09-27 SURGICAL SUPPLY — 76 items
APPLIER CLIP 5 13 M/L LIGAMAX5 (MISCELLANEOUS)
APPLIER CLIP ROT 10 11.4 M/L (STAPLE)
APR CLP MED LRG 11.4X10 (STAPLE)
APR CLP MED LRG 5 ANG JAW (MISCELLANEOUS)
BLADE EXTENDED COATED 6.5IN (ELECTRODE) ×2 IMPLANT
BLADE HEX COATED 2.75 (ELECTRODE) ×3 IMPLANT
BLADE SURG SZ10 CARB STEEL (BLADE) ×2 IMPLANT
CABLE HIGH FREQUENCY MONO STRZ (ELECTRODE) ×2 IMPLANT
CANISTER SUCTION 2500CC (MISCELLANEOUS) ×2 IMPLANT
CANNULA ENDOPATH XCEL 11M (ENDOMECHANICALS) IMPLANT
CELLS DAT CNTRL 66122 CELL SVR (MISCELLANEOUS) IMPLANT
CLIP APPLIE 5 13 M/L LIGAMAX5 (MISCELLANEOUS) IMPLANT
CLIP APPLIE ROT 10 11.4 M/L (STAPLE) IMPLANT
CLOTH BEACON ORANGE TIMEOUT ST (SAFETY) ×2 IMPLANT
COVER MAYO STAND STRL (DRAPES) ×2 IMPLANT
DECANTER SPIKE VIAL GLASS SM (MISCELLANEOUS) ×2 IMPLANT
DISSECTOR BLUNT TIP ENDO 5MM (MISCELLANEOUS) IMPLANT
DRAIN CHANNEL 19F RND (DRAIN) ×1 IMPLANT
DRAPE LAPAROSCOPIC ABDOMINAL (DRAPES) ×2 IMPLANT
DRAPE LG THREE QUARTER DISP (DRAPES) ×1 IMPLANT
DRAPE WARM FLUID 44X44 (DRAPE) ×2 IMPLANT
ELECT REM PT RETURN 9FT ADLT (ELECTROSURGICAL) ×2
ELECTRODE REM PT RTRN 9FT ADLT (ELECTROSURGICAL) ×1 IMPLANT
EVACUATOR 1/8 PVC DRAIN (DRAIN) ×1 IMPLANT
FILTER SMOKE EVAC LAPAROSHD (FILTER) ×1 IMPLANT
GLOVE BIOGEL PI IND STRL 7.0 (GLOVE) ×1 IMPLANT
GLOVE BIOGEL PI INDICATOR 7.0 (GLOVE) ×1
GLOVE ECLIPSE 8.0 STRL XLNG CF (GLOVE) ×4 IMPLANT
GLOVE INDICATOR 8.0 STRL GRN (GLOVE) ×4 IMPLANT
GOWN STRL NON-REIN LRG LVL3 (GOWN DISPOSABLE) ×3 IMPLANT
GOWN STRL REIN XL XLG (GOWN DISPOSABLE) ×5 IMPLANT
KIT BASIN OR (CUSTOM PROCEDURE TRAY) ×2 IMPLANT
LEGGING LITHOTOMY PAIR STRL (DRAPES) ×1 IMPLANT
LIGASURE IMPACT 36 18CM CVD LR (INSTRUMENTS) IMPLANT
NS IRRIG 1000ML POUR BTL (IV SOLUTION) ×4 IMPLANT
PENCIL BUTTON HOLSTER BLD 10FT (ELECTRODE) ×3 IMPLANT
RELOAD PROXIMATE 75MM BLUE (ENDOMECHANICALS) ×2 IMPLANT
RELOAD STAPLE 75 3.8 BLU REG (ENDOMECHANICALS) IMPLANT
RETRACTOR WND ALEXIS 18 MED (MISCELLANEOUS) IMPLANT
RTRCTR WOUND ALEXIS 18CM MED (MISCELLANEOUS)
SCALPEL HARMONIC ACE (MISCELLANEOUS) ×1 IMPLANT
SCISSORS LAP 5X35 DISP (ENDOMECHANICALS) ×2 IMPLANT
SET IRRIG TUBING LAPAROSCOPIC (IRRIGATION / IRRIGATOR) ×1 IMPLANT
SLEEVE XCEL OPT CAN 5 100 (ENDOMECHANICALS) IMPLANT
SOLUTION ANTI FOG 6CC (MISCELLANEOUS) ×2 IMPLANT
SPONGE GAUZE 4X4 12PLY (GAUZE/BANDAGES/DRESSINGS) ×2 IMPLANT
SPONGE LAP 18X18 X RAY DECT (DISPOSABLE) ×4 IMPLANT
STAPLER PROXIMATE 75MM BLUE (STAPLE) ×1 IMPLANT
STAPLER VISISTAT 35W (STAPLE) ×2 IMPLANT
SUCTION POOLE TIP (SUCTIONS) ×2 IMPLANT
SUT ETHILON 2 0 PS N (SUTURE) ×1 IMPLANT
SUT PDS AB 1 CTX 36 (SUTURE) ×2 IMPLANT
SUT PDS AB 1 TP1 96 (SUTURE) IMPLANT
SUT PROLENE 2 0 SH DA (SUTURE) IMPLANT
SUT SILK 2 0 (SUTURE) ×2
SUT SILK 2 0 SH CR/8 (SUTURE) ×2 IMPLANT
SUT SILK 2-0 18XBRD TIE 12 (SUTURE) ×1 IMPLANT
SUT SILK 3 0 (SUTURE) ×2
SUT SILK 3 0 SH CR/8 (SUTURE) ×2 IMPLANT
SUT SILK 3-0 18XBRD TIE 12 (SUTURE) ×1 IMPLANT
SUT VICRYL 2 0 18  UND BR (SUTURE) ×1
SUT VICRYL 2 0 18 UND BR (SUTURE) ×1 IMPLANT
SUT VICRYL 3 0 (SUTURE) ×2 IMPLANT
SYS LAPSCP GELPORT 120MM (MISCELLANEOUS)
SYSTEM LAPSCP GELPORT 120MM (MISCELLANEOUS) IMPLANT
TAPE CLOTH SURG 4X10 WHT LF (GAUZE/BANDAGES/DRESSINGS) ×1 IMPLANT
TOWEL OR 17X26 10 PK STRL BLUE (TOWEL DISPOSABLE) ×4 IMPLANT
TRAY FOLEY CATH 14FRSI W/METER (CATHETERS) ×2 IMPLANT
TRAY LAP CHOLE (CUSTOM PROCEDURE TRAY) ×2 IMPLANT
TROCAR BLADELESS OPT 5 100 (ENDOMECHANICALS) IMPLANT
TROCAR BLADELESS OPT 5 75 (ENDOMECHANICALS) ×5 IMPLANT
TROCAR XCEL BLUNT TIP 100MML (ENDOMECHANICALS) IMPLANT
TROCAR XCEL NON-BLD 11X100MML (ENDOMECHANICALS) IMPLANT
TUBING INSUFFLATION 10FT LAP (TUBING) ×2 IMPLANT
YANKAUER SUCT BULB TIP 10FT TU (MISCELLANEOUS) ×2 IMPLANT
YANKAUER SUCT BULB TIP NO VENT (SUCTIONS) ×2 IMPLANT

## 2012-09-27 NOTE — Anesthesia Postprocedure Evaluation (Signed)
Anesthesia Post Note  Patient: Nicholas [name]  Procedure(s) Performed: Procedure(s) (LRB): LAPAROSCOPIC COLOSTOMY TAKEDOWN (N/A)  Anesthesia type: General  Patient location: PACU  Post pain: Pain level controlled  Post assessment: Post-op Vital signs reviewed  Last Vitals: BP 160/89  Pulse 77  Temp 36.5 C (Oral)  Resp 13  Ht 6' (1.829 m)  Wt 243 lb 0.3 oz (110.232 kg)  BMI 32.96 kg/m2  SpO2 94%  Post vital signs: Reviewed  Level of consciousness: sedated  Complications: No apparent anesthesia complications

## 2012-09-27 NOTE — Preoperative (Signed)
Beta Blockers   Reason not to administer Beta Blockers:Not Applicable 

## 2012-09-27 NOTE — Op Note (Signed)
Operative Note  Nicholas [name] male 71 y.o. 09/27/2012  PREOPERATIVE DX:  Descending colostomy  POSTOPERATIVE DX:  Same  PROCEDURE:  Laparoscopic lysis of adhesions (45 minutes), segmental colectomy, and colostomy closure         Surgeon: Adolph Pollack   Assistants: Cyndia Bent, M.D.  Anesthesia: General endotracheal anesthesia  Indications: This is a 71 year old male had perforated sigmoid diverticulitis in March of this year. He underwent emergency sigmoid colectomy and colostomy. He has recovered and is now admitted for colostomy closure.    Procedure Detail:  He was seen in the holding room. He was brought to the operating room placed supine on the operating table a general anesthetic was administered. He was placed in the lithotomy position. A Foley catheter was inserted. The hair of the abdominal wall was clipped. The left-sided colostomy bag applied to remove. Abdominal area colostomy were sterilely prepped and draped.  An incision was made and the right lateral abdomen the skin and subcutaneous tissue were dissected down to the anterior fascial layer. I made an incision in anterior fascia and placed a pursestring suture of 0 Vicryl around this. I then identified the posterior fascia and peritoneum and made an incision in both of these entrying into the peritoneal cavity under direct vision.A Hassan trocar was introduced into the peritoneal cavity and a pneumoperitoneum was created by insufflating CO2 gas. The laparoscope was introduced. Adhesions between the omentum and anterior abdominal wall were noted. A trocar placed in the right lower quadrant and one was also placed in the epigastric region. Using sharp dissection I divided adhesions between the abdominal wall and the omentum. I also used a Harmonic scalpel to further divide some of these adhesions. I divided adhesions between the small bowel and the colon and colostomy site as well as the omentum and the colostomy  site. Subsequently all adhesions were dissected free from the abdominal wall.  I then looked in the pelvis and identified the suture on the rectosigmoid stump. I divided some adhesions mobilizing this off the lateral pelvic wall.  Next I approached the colostomy and made an elliptical incision around it. Using electrocautery I dissected the colostomy free from the subcutaneous tissue and the fascia completely mobilizing it. It could be extended below the pubic bone. I performed a segmental colectomy with the GIA stapler and handed this off the field.  I then closed the fascia the colostomy site with a running #1 PDS suture. The midportion was left slightly open and a 12 mm trocar was placed through this.  The abdomen was reinsufflated and I mobilized the rectosigmoid stump some more. At this point time I then made a limited lower midline incision and completed mobilization of the rectum sigmoid stump under direct vision.  The proximal portion of the colon was then placed in a side to side fashion at the rectosigmoid stump. A side to side stapled anastomosis was performed with the GIA stapler. The common defect was closed with a running 3-0 Vicryl suture full-thickness followed by 3-0 silk Lembert sutures for a 2 layer closure. The staple line and the anastomosis was noted to be hemostatic prior to closing the common defect. A crotch stitch of 3-0 silk was used. The anastomosis was patent, viable, and under no tension.  Next a rigid proctosigmoidoscope was introduced in the anus and air was insufflated the area proximal to the anastomosis was occluded and anastomosis was placed under water. There is no evidence of leak.  Abdomen was copiously irrigated  and the fluid evacuated. The right lower quadrant trocar was removed and a 19 Blake drain was placed through this small incision and into the pelvic area. It was anchored to the skin with a 3-0 nylon suture.  The limited lower midline incision fascia was  then closed with a running #1 PDS suture. The abdomen was reinsufflated and a four-quadrant inspection was performed. There is no evidence of bleeding or organ injury. The trocars were all removed. The colostomy site fascia was closure was completed by tying the sutures together. The right mid abdomen incision was closed by tightening down and tying the 0 Vicryl suture. The colostomy site was packed with moistened gauze. The other skin incisions were closed with staples. Sterile dressings were applied to all areas.  He tolerated the procedure well without any apparent complications and was taken to the recovery in satisfactory condition.  Estimated Blood Loss:  300 mL         Drains: JACKSON-PRATT (JP)  Blood Given: none          Specimens: Segment of colon with colostomy        Complications:  * No complications entered in OR log *         Disposition: PACU - hemodynamically stable.         Condition: stable

## 2012-09-27 NOTE — Interval H&P Note (Signed)
History and Physical Interval Note:  09/27/2012 11:29 AM  Nicholas [name]  has presented today for surgery, with the diagnosis of colostomy  The various methods of treatment have been discussed with the patient and family. After consideration of risks, benefits and other options for treatment, the patient has consented to  Procedure(s) (LRB) with comments: LAPAROSCOPIC COLOSTOMY TAKEDOWN (N/A) - Laparoscopic Colostomy Takedown as a surgical intervention .  The patient's history has been reviewed, patient examined, no change in status, stable for surgery.  I have reviewed the patient's chart and labs.  Questions were answered to the patient's satisfaction.     [name] [facility]

## 2012-09-27 NOTE — Transfer of Care (Signed)
Immediate Anesthesia Transfer of Care Note  Patient: Nicholas [name]  Procedure(s) Performed: Procedure(s) (LRB) with comments: LAPAROSCOPIC COLOSTOMY TAKEDOWN (N/A) - Laparoscopic Colostomy Takedown  Patient Location: PACU  Anesthesia Type:General  Level of Consciousness: awake, patient cooperative, lethargic and responds to stimulation  Airway & Oxygen Therapy: Patient Spontanous Breathing and Patient connected to face mask oxygen  Post-op Assessment: Report given to PACU RN, Post -op Vital signs reviewed and stable and Patient moving all extremities  Post vital signs: Reviewed and stable  Complications: No apparent anesthesia complications

## 2012-09-27 NOTE — H&P (Signed)
Nicholas [name] is an 71 y.o. male.   Chief Complaint:   Here for elective colostomy closure HPI:   He had perforated sigmoid diverticulitis in March of this year and underwent an emergency sigmoid colectomy and colostomy.  He had a slow postoperative recovery but is now ready for colostomy closure.  Past Medical History  Diagnosis Date  . Diabetes mellitus     boarderline  . BPH (benign prostatic hypertrophy)   . Neuropathy   . OSA on CPAP     cpap- doe snot know settings   . Arthritis   . Sinus infection     dx 12/13   . Heart murmur     noted on visit of 09/20/12- [facility]   . Blister of foot or toe     left ssecond toe healing per note of 09/21/12     Past Surgical History  Procedure Date  . Cholecystectomy   . Laparotomy 12/23/2011    Procedure: EXPLORATORY LAPAROTOMY;  Surgeon: Adolph Pollack, MD;  Location: [facility];  Service: General;  Laterality: N/A;  . Appendectomy 12/23/2011    Procedure: APPENDECTOMY;  Surgeon: Adolph Pollack, MD;  Location: [facility];  Service: General;  Laterality: N/A;  . Colostomy revision 12/23/2011    Procedure: COLON RESECTION SIGMOID;  Surgeon: Adolph Pollack, MD;  Location: [facility];  Service: General;  Laterality: N/AGertie Gowda procedure   . Cardiac catheterization     [facility]  . Bilateral hand surgery      metal placed in hands due to trauma in 2011   . Right shoulder surgery      related to trauma     Family History  Problem Relation Age of Onset  . Diabetes Mother   . Cancer Mother     skin  . Heart disease Mother   . Heart attack Mother   . Cancer Father     Leukemia  . Colon cancer Neg Hx    Social History:  reports that he quit smoking about 18 years ago. His smoking use included Cigarettes. He has a 80 pack-year smoking history. He has quit using smokeless tobacco. He reports that he does not drink alcohol or use illicit drugs.  Allergies:  Allergies  Allergen Reactions  . Sudafed (Pseudoephedrine Hcl)     "makes me feel  drunk"    Medications Prior to Admission  Medication Sig Dispense Refill  . acetaminophen (TYLENOL) 500 MG tablet Take 1,000 mg by mouth every 6 (six) hours as needed. For pain.      Marland Kitchen erythromycin base (E-MYCIN) 500 MG tablet Take 500 mg by mouth 3 (three) times daily. For one day      . guaiFENesin (MUCINEX) 600 MG 12 hr tablet Take 600 mg by mouth 2 (two) times daily.      . metformin (FORTAMET) 500 MG (OSM) 24 hr tablet Take 1 tablet (500 mg total) by mouth daily.  30 tablet  6  . Tamsulosin HCl (FLOMAX) 0.4 MG CAPS Take 0.8 mg by mouth at bedtime.       . traMADol (ULTRAM) 50 MG tablet Take 100 mg by mouth 2 (two) times daily.      Marland Kitchen aspirin EC 81 MG tablet Take 81 mg by mouth daily.      . naproxen sodium (ANAPROX) 220 MG tablet Take 220 mg by mouth 2 (two) times daily as needed. For pain      . sulfamethoxazole-trimethoprim (BACTRIM DS,SEPTRA DS) 800-160 MG  per tablet Take 1 tablet by mouth 2 (two) times daily. Due to complete on 09/21/12        Results for orders placed during the hospital encounter of 09/27/12 (from the past 48 hour(s))  GLUCOSE, CAPILLARY     Status: Abnormal   Collection Time   09/27/12  9:12 AM      Component Value Range Comment   Glucose-Capillary 112 (*) 70 - 99 mg/dL    No results found.  Review of Systems  Constitutional: Negative for fever and chills.  HENT: Positive for congestion (chronic).   Gastrointestinal: Negative for nausea, vomiting and diarrhea.    Blood pressure 141/79, pulse 91, temperature 98.3 F (36.8 C), temperature source Oral, resp. rate 18, SpO2 97.00%. Physical Exam  Constitutional: No distress.       Overweight male.  HENT:  Head: Normocephalic and atraumatic.  Cardiovascular: Normal rate and regular rhythm.   Respiratory: Effort normal and breath sounds normal.  GI: Soft.       RUQ scar.  Midline scar.  Left sided colostomy.  Musculoskeletal: He exhibits no edema.  Neurological: He is alert.  Skin: Skin is warm and  dry.     Assessment/Plan Colostomy.  Plan:  Laparoscopic assisted colostomy closure.  Procedure and risks have been discussed with him.  Daren Doswell J 09/27/2012, 11:25 AM

## 2012-09-27 NOTE — Anesthesia Preprocedure Evaluation (Signed)
Anesthesia Evaluation  Patient identified by MRN, date of birth, ID band Patient awake    Reviewed: Allergy & Precautions, H&P , NPO status , Patient's Chart, lab work & pertinent test results  Airway Mallampati: III TM Distance: >3 FB Neck ROM: full    Dental  (+) Missing, Dental Advisory Given and Partial Upper,    Pulmonary neg pulmonary ROS, sleep apnea and Continuous Positive Airway Pressure Ventilation ,  breath sounds clear to auscultation  Pulmonary exam normal       Cardiovascular Exercise Tolerance: Good negative cardio ROS  Rhythm:regular Rate:Normal     Neuro/Psych negative neurological ROS  negative psych ROS   GI/Hepatic negative GI ROS, Neg liver ROS,   Endo/Other  diabetes, Type 2, Oral Hypoglycemic AgentsBorderline DM  Renal/GU negative Renal ROS  negative genitourinary   Musculoskeletal   Abdominal   Peds  Hematology negative hematology ROS (+)   Anesthesia Other Findings   Reproductive/Obstetrics negative OB ROS                           Anesthesia Physical Anesthesia Plan  ASA: II  Anesthesia Plan: General   Post-op Pain Management:    Induction: Intravenous  Airway Management Planned: Oral ETT  Additional Equipment:   Intra-op Plan:   Post-operative Plan: Extubation in OR  Informed Consent: I have reviewed the patients History and Physical, chart, labs and discussed the procedure including the risks, benefits and alternatives for the proposed anesthesia with the patient or authorized representative who has indicated his/her understanding and acceptance.   Dental advisory given  Plan Discussed with: CRNA  Anesthesia Plan Comments:         Anesthesia Quick Evaluation

## 2012-09-28 ENCOUNTER — Encounter (HOSPITAL_COMMUNITY): Payer: Self-pay | Admitting: General Surgery

## 2012-09-28 DIAGNOSIS — Z433 Encounter for attention to colostomy: Secondary | ICD-10-CM | POA: Diagnosis not present

## 2012-09-28 DIAGNOSIS — E119 Type 2 diabetes mellitus without complications: Secondary | ICD-10-CM

## 2012-09-28 DIAGNOSIS — K660 Peritoneal adhesions (postprocedural) (postinfection): Secondary | ICD-10-CM | POA: Diagnosis not present

## 2012-09-28 DIAGNOSIS — Z933 Colostomy status: Secondary | ICD-10-CM

## 2012-09-28 HISTORY — DX: Colostomy status: Z93.3

## 2012-09-28 LAB — BASIC METABOLIC PANEL
BUN: 9 mg/dL (ref 6–23)
Creatinine, Ser: 0.91 mg/dL (ref 0.50–1.35)
GFR calc Af Amer: >90 mL/min (ref >90)
GFR calc non Af Amer: 84 mL/min — ABNORMAL LOW (ref >90)
Glucose, Bld: 146 mg/dL — ABNORMAL HIGH (ref 70–99)
Potassium: 4.1 mEq/L (ref 3.5–5.1)

## 2012-09-28 LAB — GLUCOSE, CAPILLARY
Glucose-Capillary: 149 mg/dL — ABNORMAL HIGH (ref 70–99)
Glucose-Capillary: 139 mg/dL — ABNORMAL HIGH (ref 70–99)
Glucose-Capillary: 117 mg/dL — ABNORMAL HIGH (ref 70–99)
Glucose-Capillary: 141 mg/dL — ABNORMAL HIGH (ref 70–99)

## 2012-09-28 LAB — CBC
HCT: 35.3 % — ABNORMAL LOW (ref 39.0–52.0)
Hemoglobin: 12.2 g/dL — ABNORMAL LOW (ref 13.0–17.0)
MCHC: 34.6 g/dL (ref 30.0–36.0)
MCV: 89.8 fL (ref 78.0–100.0)
RDW: 13.6 % (ref 11.5–15.5)

## 2012-09-28 NOTE — Progress Notes (Signed)
Spoke with pt in regards to nighttime CPAP, pt currently refuses and is comfortable on 2L nasal cannula. Understands to call if this decision changes RT is available for any further assistance.

## 2012-09-28 NOTE — Care Management Note (Unsigned)
    Page 1 of 1   09/28/2012     4:31:38 PM   CARE MANAGEMENT NOTE 09/28/2012  Patient:  Nicholas [name], Nicholas [name]   Account Number:  [id number]  Date Initiated:  09/28/2012  Documentation initiated by:  Lanier Clam  Subjective/Objective Assessment:   ADMITTED W/ELECTIVE COLOSTOMY CLOSURE.     Action/Plan:   [facility] HOME W/SPOUSE.   Anticipated DC Date:  10/03/2012   Anticipated DC Plan:  HOME/SELF CARE      DC Planning Services  CM consult      Choice offered to / List presented to:             Status of service:  In process, will continue to follow Medicare Important Message given?   (If response is "NO", the following Medicare IM given date fields will be blank) Date Medicare IM given:   Date Additional Medicare IM given:    Discharge Disposition:    Per UR Regulation:  Reviewed for med. necessity/level of care/duration of stay  If discussed at Long Length of Stay Meetings, dates discussed:    Comments:  09/28/12 [facility] RN,BSN NCM [phone]

## 2012-09-28 NOTE — Progress Notes (Signed)
1 Day Post-Op  Subjective: Very sore.  A little nausea.  Objective: Vital signs in last 24 hours: Temp:  [97.4 F (36.3 C)-99.1 F (37.3 C)] 99.1 F (37.3 C) (12/20 1005) Pulse Rate:  [77-106] 84  (12/20 1005) Resp:  [8-18] 12  (12/20 1005) BP: (110-201)/(66-112) 160/91 mmHg (12/20 1005) SpO2:  [92 %-100 %] 96 % (12/20 1005) FiO2 (%):  [33 %-39 %] 39 % (12/20 0010) Weight:  [243 lb 0.3 oz (110.232 kg)] 243 lb 0.3 oz (110.232 kg) (12/19 1636)    Intake/Output from previous day: 12/19 0701 - 12/20 0700 In: 3839.2 [I.V.:3579.2; IV Piggyback:100] Out: 1940 [Urine:1600; Drains:90; Blood:250] Intake/Output this shift: Total I/O In: 0  Out: 260 [Urine:250; Drains:10]  PE: General- In NAD Abdomen-soft, occasional bowel sound, dressings dry, serosanguinous drain output  Lab Results:   Basename 09/28/12 0428  WBC 12.7*  HGB 12.2*  HCT 35.3*  PLT 175   BMET  Basename 09/28/12 0428  NA 136  K 4.1  CL 103  CO2 27  GLUCOSE 146*  BUN 9  CREATININE 0.91  CALCIUM 8.4   PT/INR No results found for this basename: LABPROT:2,INR:2 in the last 72 hours Comprehensive Metabolic Panel:    Component Value Date/Time   NA 136 09/28/2012 0428   K 4.1 09/28/2012 0428   CL 103 09/28/2012 0428   CO2 27 09/28/2012 0428   BUN 9 09/28/2012 0428   CREATININE 0.91 09/28/2012 0428   CREATININE 0.98 11/18/2011 1624   GLUCOSE 146* 09/28/2012 0428   CALCIUM 8.4 09/28/2012 0428   AST 21 09/21/2012 1005   ALT 21 09/21/2012 1005   ALKPHOS 61 09/21/2012 1005   BILITOT 0.3 09/21/2012 1005   PROT 7.1 09/21/2012 1005   ALBUMIN 3.7 09/21/2012 1005     Studies/Results: No results found.  Anti-infectives: Anti-infectives     Start     Dose/Rate Route Frequency Ordered Stop   09/27/12 1800   cefOXitin (MEFOXIN) 1 g in dextrose 5 % 50 mL IVPB        1 g 100 mL/hr over 30 Minutes Intravenous Every 6 hours 09/27/12 1635 09/28/12 0613   09/27/12 0837   cefOXitin (MEFOXIN) 2 g in dextrose 5  % 50 mL IVPB        2 g 100 mL/hr over 30 Minutes Intravenous On call to O.R. 09/27/12 0837 09/27/12 1157          Assessment Principal Problem:  *Descending colostomy s/p reversal 09/27/12-stable overnight Active Problems:  SLEEP APNEA, OBSTRUCTIVE-on CPAP at night  DIABETES MELLITUS-cbgs < 200 on SSI    LOS: 1 day   Plan: OOB. Clear liquids.  Begin dressing changes to open colostomy site wound tomorrow.   Nicholas [name] J 09/28/2012

## 2012-09-29 LAB — GLUCOSE, CAPILLARY
Glucose-Capillary: 115 mg/dL — ABNORMAL HIGH (ref 70–99)
Glucose-Capillary: 131 mg/dL — ABNORMAL HIGH (ref 70–99)
Glucose-Capillary: 158 mg/dL — ABNORMAL HIGH (ref 70–99)
Glucose-Capillary: 123 mg/dL — ABNORMAL HIGH (ref 70–99)
Glucose-Capillary: 174 mg/dL — ABNORMAL HIGH (ref 70–99)

## 2012-09-29 LAB — CBC
HCT: 31.4 % — ABNORMAL LOW (ref 39.0–52.0)
MCV: 91.8 fL (ref 78.0–100.0)
Platelets: 149 10*3/uL — ABNORMAL LOW (ref 150–400)
RBC: 3.42 MIL/uL — ABNORMAL LOW (ref 4.22–5.81)
RDW: 14 % (ref 11.5–15.5)
WBC: 9.9 10*3/uL (ref 4.0–10.5)

## 2012-09-29 LAB — BASIC METABOLIC PANEL
CO2: 30 mEq/L (ref 19–32)
Chloride: 104 mEq/L (ref 96–112)
GFR calc Af Amer: >90 mL/min (ref >90)
Potassium: 3.7 mEq/L (ref 3.5–5.1)

## 2012-09-29 MED ORDER — INSULIN ASPART 100 UNIT/ML ~~LOC~~ SOLN
0.0000 [IU] | Freq: Three times a day (TID) | SUBCUTANEOUS | Status: DC
  Administered 2012-09-30 – 2012-10-01 (×5): 2 [IU] via SUBCUTANEOUS

## 2012-09-29 MED ORDER — INSULIN ASPART 100 UNIT/ML ~~LOC~~ SOLN: 0.0000 [IU] | Freq: Every day | SUBCUTANEOUS | Status: DC

## 2012-09-29 MED ORDER — INSULIN ASPART 100 UNIT/ML ~~LOC~~ SOLN: 0.0000 [IU] | Freq: Three times a day (TID) | SUBCUTANEOUS | Status: DC

## 2012-09-29 NOTE — Progress Notes (Signed)
2 Days Post-Op  Subjective: Hasn't urinated since foley taken out around 0700. No emesis. No flatus. Some burping. Very sore  Objective: Vital signs in last 24 hours: Temp:  [97.8 F (36.6 C)-99.1 F (37.3 C)] 97.8 F (36.6 C) (12/21 0603) Pulse Rate:  [84-106] 106  (12/21 0603) Resp:  [12-19] 16  (12/21 0604) BP: (159-165)/(74-93) 164/79 mmHg (12/21 0603) SpO2:  [94 %-98 %] 96 % (12/21 0604) FiO2 (%):  [30 %-41 %] 41 % (12/21 0604)    Intake/Output from previous day: 12/20 0701 - 12/21 0700 In: 3104.2 [P.O.:480; I.V.:2624.2] Out: 2175 [Urine:2125; Drains:50] Intake/Output this shift:    Alert, nad cta b/l Reg Obese, distended, incision c/d/i. Old ostomy site - fascia intact. No cellulitis; +BS Drain - serosang  Lab Results:   [facility] 09/29/12 0418 09/28/12 0428  WBC 9.9 12.7*  HGB 10.8* 12.2*  HCT 31.4* 35.3*  PLT 149* 175   BMET  Basename 09/29/12 0418 09/28/12 0428  NA 138 136  K 3.7 4.1  CL 104 103  CO2 30 27  GLUCOSE 126* 146*  BUN 6 9  CREATININE 0.95 0.91  CALCIUM 8.5 8.4   PT/INR No results found for this basename: LABPROT:2,INR:2 in the last 72 hours ABG No results found for this basename: PHART:2,PCO2:2,PO2:2,HCO3:2 in the last 72 hours  Studies/Results: No results found.  Anti-infectives: Anti-infectives     Start     Dose/Rate Route Frequency Ordered Stop   09/27/12 1800   cefOXitin (MEFOXIN) 1 g in dextrose 5 % 50 mL IVPB        1 g 100 mL/hr over 30 Minutes Intravenous Every 6 hours 09/27/12 1635 09/28/12 0613   09/27/12 0837   cefOXitin (MEFOXIN) 2 g in dextrose 5 % 50 mL IVPB        2 g 100 mL/hr over 30 Minutes Intravenous On call to O.R. 09/27/12 0837 09/27/12 1157          Assessment/Plan: s/p Procedure(s) (LRB) with comments: LAPAROSCOPIC COLOSTOMY TAKEDOWN (N/A) - Laparoscopic Colostomy Takedown  Await return of bowel function - do not adv diet Blood sugars ok Cont VTE prophylaxis Ambulate If doesn't urinate -  replace foley  Mary Sella. Andrey Campanile, MD, FACS General, Bariatric, & Minimally Invasive Surgery [facility], [place]   LOS: 2 days    Atilano Ina 09/29/2012

## 2012-09-29 NOTE — Progress Notes (Signed)
Spoke with pt regarding cpap.  Pt refuses to wear it tonight, and prefers his Bison @2l .  Pt was advised that RT available all night should he change his mind, and encouraged him to let his nurse know.  RN notified.

## 2012-09-30 LAB — BASIC METABOLIC PANEL
CO2: 28 mEq/L (ref 19–32)
Calcium: 8.4 mg/dL (ref 8.4–10.5)
GFR calc Af Amer: >90 mL/min (ref >90)
Sodium: 137 mEq/L (ref 135–145)

## 2012-09-30 LAB — CBC
MCV: 90.6 fL (ref 78.0–100.0)
Platelets: 142 10*3/uL — ABNORMAL LOW (ref 150–400)
RBC: 3.2 MIL/uL — ABNORMAL LOW (ref 4.22–5.81)
RDW: 13.5 % (ref 11.5–15.5)
WBC: 7.6 10*3/uL (ref 4.0–10.5)

## 2012-09-30 LAB — GLUCOSE, CAPILLARY: Glucose-Capillary: 125 mg/dL — ABNORMAL HIGH (ref 70–99)

## 2012-09-30 MED ORDER — GI COCKTAIL ~~LOC~~
30.0000 mL | Freq: Once | ORAL | Status: DC
  Filled 2012-09-30 (×2): qty 30

## 2012-09-30 MED ORDER — HEPARIN SODIUM (PORCINE) 5000 UNIT/ML IJ SOLN
5000.0000 [IU] | Freq: Three times a day (TID) | INTRAMUSCULAR | Status: DC
  Administered 2012-10-01 – 2012-10-03 (×6): 5000 [IU] via SUBCUTANEOUS
  Filled 2012-09-30 (×13): qty 1

## 2012-09-30 NOTE — Progress Notes (Signed)
3 Days Post-Op  Subjective: Urinated spont yesterday. Having bloody BMs. +flatus but still burping quite a lot. Feels bloated and got nauseous. Mild tachy  Objective: Vital signs in last 24 hours: Temp:  [98.5 F (36.9 C)-99.6 F (37.6 C)] 98.8 F (37.1 C) (12/22 0540) Pulse Rate:  [101-118] 101  (12/22 0540) Resp:  [10-20] 13  (12/22 0540) BP: (127-158)/(73-86) 132/79 mmHg (12/22 0540) SpO2:  [94 %-98 %] 98 % (12/22 0540)    Intake/Output from previous day: 12/21 0701 - 12/22 0700 In: 2826.7 [P.O.:480; I.V.:2346.7] Out: 1095 [Urine:1000; Drains:95] Intake/Output this shift:    Alert, nad cta ant b/l Reg rhythm Distended, soft, dressing c/d/i. jp-serosang. hypobs No edema  Lab Results:   Basename 09/30/12 0430 09/29/12 0418  WBC 7.6 9.9  HGB 10.0* 10.8*  HCT 29.0* 31.4*  PLT 142* 149*   BMET  Basename 09/30/12 0430 09/29/12 0418  NA 137 138  K 3.6 3.7  CL 105 104  CO2 28 30  GLUCOSE 129* 126*  BUN 7 6  CREATININE 0.86 0.95  CALCIUM 8.4 8.5   PT/INR No results found for this basename: LABPROT:2,INR:2 in the last 72 hours ABG No results found for this basename: PHART:2,PCO2:2,PO2:2,HCO3:2 in the last 72 hours  Studies/Results: No results found.  Anti-infectives: Anti-infectives     Start     Dose/Rate Route Frequency Ordered Stop   09/27/12 1800   cefOXitin (MEFOXIN) 1 g in dextrose 5 % 50 mL IVPB        1 g 100 mL/hr over 30 Minutes Intravenous Every 6 hours 09/27/12 1635 09/28/12 0613   09/27/12 0837   cefOXitin (MEFOXIN) 2 g in dextrose 5 % 50 mL IVPB        2 g 100 mL/hr over 30 Minutes Intravenous On call to O.R. 09/27/12 0837 09/27/12 1157          Assessment/Plan: s/p Procedure(s) (LRB) with comments: LAPAROSCOPIC COLOSTOMY TAKEDOWN (N/A) - Laparoscopic Colostomy Takedown  Will hold subcu heparin given bloody BMs and drop in hgb. Resume in AM. Repeat cbc in AM Will keep on clears still distended and still having large amounts of  belching. Hopefully will be able to adv diet in am Ambulate, scds Cont home BP meds Blood sugars ok  Mary Sella. Andrey Campanile, MD, FACS General, Bariatric, & Minimally Invasive Surgery [facility], [place]   LOS: 3 days    Atilano Ina 09/30/2012

## 2012-09-30 NOTE — Progress Notes (Signed)
Patient refusing CPAP at this time. Encouraged to call if he changes his mind. RT will continue to monitor. 

## 2012-09-30 NOTE — Progress Notes (Signed)
Patient has 2 bm's with bright blood.  The second bm with less blood noted then first.

## 2012-09-30 NOTE — Progress Notes (Signed)
Discussed with Dr.Wilson that patient continues to have dark maroon bm's x3 this evening.  Heparin on hold at this time. Patient has CBC for am.  Vital signs stable.  Instructed to check Hemoglobin before 6 am Heparin dose.

## 2012-10-01 LAB — CBC
HCT: 27.2 % — ABNORMAL LOW (ref 39.0–52.0)
Hemoglobin: 9.2 g/dL — ABNORMAL LOW (ref 13.0–17.0)
MCH: 30.9 pg (ref 26.0–34.0)
MCHC: 33.8 g/dL (ref 30.0–36.0)
MCV: 91.3 fL (ref 78.0–100.0)
RBC: 2.98 MIL/uL — ABNORMAL LOW (ref 4.22–5.81)

## 2012-10-01 LAB — GLUCOSE, CAPILLARY
Glucose-Capillary: 124 mg/dL — ABNORMAL HIGH (ref 70–99)
Glucose-Capillary: 122 mg/dL — ABNORMAL HIGH (ref 70–99)

## 2012-10-01 MED ORDER — OXYCODONE-ACETAMINOPHEN 5-325 MG PO TABS
1.0000 | ORAL_TABLET | ORAL | Status: DC | PRN
  Administered 2012-10-01: 2 via ORAL
  Administered 2012-10-01 – 2012-10-02 (×6): 1 via ORAL
  Administered 2012-10-02 – 2012-10-03 (×2): 2 via ORAL
  Administered 2012-10-03 – 2012-10-04 (×6): 1 via ORAL
  Administered 2012-10-04: 2 via ORAL
  Administered 2012-10-04 – 2012-10-05 (×10): 1 via ORAL
  Filled 2012-10-01 (×3): qty 1
  Filled 2012-10-01: qty 2
  Filled 2012-10-01: qty 1
  Filled 2012-10-01: qty 2
  Filled 2012-10-01 (×5): qty 1
  Filled 2012-10-01 (×3): qty 2
  Filled 2012-10-01 (×7): qty 1
  Filled 2012-10-01: qty 2
  Filled 2012-10-01 (×4): qty 1

## 2012-10-01 MED ORDER — MORPHINE SULFATE 2 MG/ML IJ SOLN
1.0000 mg | INTRAMUSCULAR | Status: DC | PRN
  Administered 2012-10-02 – 2012-10-03 (×3): 2 mg via INTRAVENOUS
  Filled 2012-10-01 (×3): qty 1

## 2012-10-01 NOTE — Progress Notes (Signed)
Hemoglobin with decrease from 09-30-12, patient  had a total of 4 maroon colored bowel movements throughout the 7p-7a shift. SQ Heparin not given at 6 am.  Current vital signs are 98.5, 89, 144/79, 97%2l nasal cannula.  Patient and resting comfortably in bed.

## 2012-10-01 NOTE — Progress Notes (Signed)
4 Days Post-Op  Subjective: Still pretty sore, up to BR, wife says stools were bloody, yesterday, but getting better. He has CPAP in room but not using with PCA.  Objective: Vital signs in last 24 hours: Temp:  [98.5 F (36.9 C)-98.9 F (37.2 C)] 98.5 F (36.9 C) (12/23 0456) Pulse Rate:  [98-108] 98  (12/23 0456) Resp:  [13-20] 17  (12/23 0744) BP: (144-159)/(79-83) 144/79 mmHg (12/23 0456) SpO2:  [97 %-100 %] 97 % (12/23 0744) Last BM Date: 10/01/12 580 PO recorded yesterday and 240 PO today,  Diet: clears, Multiple stools yesterday. Afebrile, BP trending up some, CBC OK Intake/Output from previous day: 12/22 0701 - 12/23 0700 In: 2170 [P.O.:580; I.V.:1590] Out: 130 [Drains:130] Intake/Output this shift: Total I/O In: 840 [P.O.:240; I.V.:600] Out: 40 [Drains:40]  General appearance: alert, cooperative and no distress Resp: clear to auscultation bilaterally and down some in the bases GI: soft, still distended, few BS, + flatus and BM  Lab Results:   [facility] 10/01/12 0456 09/30/12 0430  WBC 5.9 7.6  HGB 9.2* 10.0*  HCT 27.2* 29.0*  PLT 141* 142*    BMET  Basename 09/30/12 0430 09/29/12 0418  NA 137 138  K 3.6 3.7  CL 105 104  CO2 28 30  GLUCOSE 129* 126*  BUN 7 6  CREATININE 0.86 0.95  CALCIUM 8.4 8.5   PT/INR No results found for this basename: LABPROT:2,INR:2 in the last 72 hours  No results found for this basename: AST:5,ALT:5,ALKPHOS:5,BILITOT:5,PROT:5,ALBUMIN:5 in the last 168 hours   Lipase     Component Value Date/Time   LIPASE 18 12/23/2011 1035     Studies/Results: No results found.  Medications:    . gi cocktail  30 mL Oral Once  . heparin subcutaneous  5,000 Units Subcutaneous Q8H  . insulin aspart  0-15 Units Subcutaneous TID WC  . insulin aspart  0-5 Units Subcutaneous QHS  . morphine   Intravenous Q4H  . pantoprazole (PROTONIX) IV  40 mg Intravenous Q24H  . Tamsulosin HCl  0.8 mg Oral QHS    Assessment/Plan Laparoscopic lysis  of adhesions (45 minutes), segmental colectomy, and colostomy closure - 09/27/12 - Dr. Abbey Chatters.  S/p perforated sigmoid with diverticulitis 12/2011. Urinary retention post op   Anemia, acute blood loss - Hgb - 9.2 - 10/01/2012  DVT proph - restarted SQ Heparin today   Diabetes mellitus on Metformin        .  OSA on CPAP / Wife will bring in tomorrow. He does not know settings    .  BPH (benign prostatic hypertrophy)    .  Neuropathy both lower legs and feet.  Chronic arm and hand pain after trauma  BMI 34.4  TOBACCO USE 40 YEARS 1-3PPD  Cardiac catheterization during sleep apnea reportedly showed nonobstructive disease.     Plan:  Wet to dry dressing to ostomy site, d/c PCA,  FULL liquids, resume CPAP, make sure he has PO's and IV pain med.  Drainage is still clear-bloody-serous color. Heparin held yesterday and this Am for bloody stools, I will restart and recheck cbc in AM.   LOS: 4 days    JENNINGS,WILLARD 10/01/2012  Agree with above. Seems to be doing well. He has a lot of family in the room (8 people). On full liquid diet.  Ovidio Kin, MD, [facility] Surgery Pager: [phone] Office phone:  [phone]

## 2012-10-01 NOTE — Progress Notes (Signed)
Spoke to [employer], states ok to give heparin, informed of HGB and HCT that heparin was held yesterday due to maroon colored stools but pt. Only reports one bloody stool today, Will states ok to give heparin and he will recheck a cbc tomorrow. Patient currently up bathing,

## 2012-10-01 NOTE — Progress Notes (Signed)
Discussed patient bloody bowel movements with Dr. Maisie Fus.  Ordered to hold Heparin x1.

## 2012-10-01 NOTE — Progress Notes (Signed)
Pt has been refusing CPAP, RT will assist as needed.

## 2012-10-01 NOTE — Progress Notes (Signed)
Patient states drsg changed by Zola Button, Pa and Will paged to see if ok to hold heparin, patient states he had another loose bloody stool, i did not visualize patient had flushed, awaiting callback.

## 2012-10-02 LAB — CBC
Hemoglobin: 9.3 g/dL — ABNORMAL LOW (ref 13.0–17.0)
MCH: 31.3 pg (ref 26.0–34.0)
MCV: 90.2 fL (ref 78.0–100.0)
Platelets: 162 10*3/uL (ref 150–400)
RBC: 2.97 MIL/uL — ABNORMAL LOW (ref 4.22–5.81)

## 2012-10-02 LAB — GLUCOSE, CAPILLARY: Glucose-Capillary: 100 mg/dL — ABNORMAL HIGH (ref 70–99)

## 2012-10-02 NOTE — Progress Notes (Signed)
Patient ID: Nicholas [name], male   DOB: 03/10/1941, 71 y.o.   MRN: [id number] 5 Days Post-Op  Subjective: Pt feels ok, but complains of bloating.  No nausea, not much of an appetite.  Passing some flatus, no BM today.  Objective: Vital signs in last 24 hours: Temp:  [98.1 F (36.7 C)-98.7 F (37.1 C)] 98.1 F (36.7 C) (12/24 0547) Pulse Rate:  [82-86] 86  (12/24 0547) Resp:  [18-20] 18  (12/24 0547) BP: (121-142)/(71-84) 142/83 mmHg (12/24 0547) SpO2:  [92 %-97 %] 97 % (12/24 0547) Last BM Date: 10/01/12  Intake/Output from previous day: 12/23 0701 - 12/24 0700 In: 2580 [P.O.:480; I.V.:2100] Out: 130 [Drains:130] Intake/Output this shift:    PE: Abd: soft, bloated, hypoactive BS, incision c/d/i with staples present.  Drain with serosang output.  Ostomy site is packed  Lab Results:   Basename 10/02/12 0420 10/01/12 0456  WBC 5.1 5.9  HGB 9.3* 9.2*  HCT 26.8* 27.2*  PLT 162 141*   BMET  Basename 09/30/12 0430  NA 137  K 3.6  CL 105  CO2 28  GLUCOSE 129*  BUN 7  CREATININE 0.86  CALCIUM 8.4   PT/INR No results found for this basename: LABPROT:2,INR:2 in the last 72 hours CMP     Component Value Date/Time   NA 137 09/30/2012 0430   K 3.6 09/30/2012 0430   CL 105 09/30/2012 0430   CO2 28 09/30/2012 0430   GLUCOSE 129* 09/30/2012 0430   BUN 7 09/30/2012 0430   CREATININE 0.86 09/30/2012 0430   CREATININE 0.98 11/18/2011 1624   CALCIUM 8.4 09/30/2012 0430   PROT 7.1 09/21/2012 1005   ALBUMIN 3.7 09/21/2012 1005   AST 21 09/21/2012 1005   ALT 21 09/21/2012 1005   ALKPHOS 61 09/21/2012 1005   BILITOT 0.3 09/21/2012 1005   GFRNONAA 86* 09/30/2012 0430   GFRAA >90 09/30/2012 0430   Lipase     Component Value Date/Time   LIPASE 18 12/23/2011 1035       Studies/Results: No results found.  Anti-infectives: Anti-infectives     Start     Dose/Rate Route Frequency Ordered Stop   09/27/12 1800   cefOXitin (MEFOXIN) 1 g in dextrose 5 % 50 mL IVPB        1  g 100 mL/hr over 30 Minutes Intravenous Every 6 hours 09/27/12 1635 09/28/12 0613   09/27/12 0837   cefOXitin (MEFOXIN) 2 g in dextrose 5 % 50 mL IVPB        2 g 100 mL/hr over 30 Minutes Intravenous On call to O.R. 09/27/12 0837 09/27/12 1157           Assessment/Plan  1. S/p colostomy reversal  Laparoscopic lysis of adhesions (45 minutes), segmental colectomy, and colostomy closure - 09/27/12 - Dr. Abbey Chatters  2. Mild post op ileus 3. DM  4.  Post anemia stable - Hgb - 9.3 - 10/02/2012 5.  DVT proph -  SQ Heparin  Plan: 1. Will keep on full liquids today and not advance. 2. Cont to mobilize 3. hgb stable, will cont DVT prophylaxis  4. CBG in good control 5. Cont wound care for old ostomy site  LOS: 5 days    OSBORNE,KELLY E 10/02/2012, 9:16 AM Pager: [phone]  Still with moderate ileus.  Taking po's but not much out rectum.  No specific complaint.  He is doing a good job of walking. His abdomen is mildly distended, but has active bowel sounds.  His incisions  look okay.  He has serous fluid in his JP.  It has only put out 20 cc today. Agree with going slowly. Wife in room.  Ovidio Kin, MD, [facility] Surgery Pager: [phone] Office phone:  [phone]

## 2012-10-03 LAB — GLUCOSE, CAPILLARY
Glucose-Capillary: 113 mg/dL — ABNORMAL HIGH (ref 70–99)
Glucose-Capillary: 88 mg/dL (ref 70–99)
Glucose-Capillary: 107 mg/dL — ABNORMAL HIGH (ref 70–99)

## 2012-10-03 NOTE — Progress Notes (Signed)
Patient ID: Nicholas [name], male   DOB: [birth year], 71 y.o.   MRN: [id number] 6 Days Post-Op  Subjective: No major complaints but still feels "bloated". He had a small bowel movement yesterday and is passing flatus. He still however feels a little uncomfortable. Tolerating full liquids without nausea or vomiting there  Objective: Vital signs in last 24 hours: Temp:  [98.2 F (36.8 C)-99.7 F (37.6 C)] 98.2 F (36.8 C) (12/25 0618) Pulse Rate:  [78-93] 78  (12/25 0618) Resp:  [18] 18  (12/25 0618) BP: (118-133)/(64-71) 118/70 mmHg (12/25 0618) SpO2:  [97 %-98 %] 97 % (12/25 0618) Last BM Date: 10/02/12  Intake/Output from previous day: 12/24 0701 - 12/25 0700 In: 3708.8 [P.O.:1920; I.V.:1788.8] Out: 405 [Urine:300; Drains:105] Intake/Output this shift:    General appearance: alert, cooperative and no distress GI: moderately distended and tympanitic but soft and nontender. Incision/Wound: clean and dry without signs of infection. JP drainage was serosanguineous.  Lab Results:   Basename 10/02/12 0420 10/01/12 0456  WBC 5.1 5.9  HGB 9.3* 9.2*  HCT 26.8* 27.2*  PLT 162 141*   BMET No results found for this basename: NA:2,K:2,CL:2,CO2:2,GLUCOSE:2,BUN:2,CREATININE:2,CALCIUM:2 in the last 72 hours   Studies/Results: No results found.  Anti-infectives: Anti-infectives     Start     Dose/Rate Route Frequency Ordered Stop   09/27/12 1800   cefOXitin (MEFOXIN) 1 g in dextrose 5 % 50 mL IVPB        1 g 100 mL/hr over 30 Minutes Intravenous Every 6 hours 09/27/12 1635 09/28/12 0613   09/27/12 0837   cefOXitin (MEFOXIN) 2 g in dextrose 5 % 50 mL IVPB        2 g 100 mL/hr over 30 Minutes Intravenous On call to O.R. 09/27/12 0837 09/27/12 1157          Assessment/Plan: s/p Procedure(s): LAPAROSCOPIC COLOSTOMY TAKEDOWN Still some degree of ileus. No apparent significant complication. Continue full liquids today. Try to increase ambulation. Labs in a.m.   LOS: 6 days     Yaslin Kirtley T 10/03/2012

## 2012-10-04 ENCOUNTER — Other Ambulatory Visit: Payer: Self-pay | Admitting: Family Medicine

## 2012-10-04 DIAGNOSIS — E119 Type 2 diabetes mellitus without complications: Secondary | ICD-10-CM | POA: Diagnosis not present

## 2012-10-04 LAB — CBC WITH DIFFERENTIAL/PLATELET
Basophils Absolute: 0 10*3/uL (ref 0.0–0.1)
Basophils Relative: 0 % (ref 0–1)
Eosinophils Absolute: 0.1 10*3/uL (ref 0.0–0.7)
HCT: 26.9 % — ABNORMAL LOW (ref 39.0–52.0)
Hemoglobin: 9.1 g/dL — ABNORMAL LOW (ref 13.0–17.0)
MCV: 90.6 fL (ref 78.0–100.0)
Monocytes Relative: 13 % — ABNORMAL HIGH (ref 3–12)
Neutro Abs: 2.8 10*3/uL (ref 1.7–7.7)
Neutrophils Relative %: 61 % (ref 43–77)
Platelets: 178 10*3/uL (ref 150–400)
RBC: 2.97 MIL/uL — ABNORMAL LOW (ref 4.22–5.81)
RDW: 13.7 % (ref 11.5–15.5)
WBC: 4.6 10*3/uL (ref 4.0–10.5)

## 2012-10-04 LAB — GLUCOSE, CAPILLARY: Glucose-Capillary: 108 mg/dL — ABNORMAL HIGH (ref 70–99)

## 2012-10-04 LAB — BASIC METABOLIC PANEL
BUN: 7 mg/dL (ref 6–23)
GFR calc Af Amer: >90 mL/min (ref >90)
GFR calc non Af Amer: 84 mL/min — ABNORMAL LOW (ref >90)
Potassium: 3.5 mEq/L (ref 3.5–5.1)
Sodium: 137 mEq/L (ref 135–145)

## 2012-10-04 MED ORDER — HEPARIN SODIUM (PORCINE) 5000 UNIT/ML IJ SOLN
5000.0000 [IU] | Freq: Three times a day (TID) | INTRAMUSCULAR | Status: DC
  Filled 2012-10-04 (×5): qty 1

## 2012-10-04 MED ORDER — METFORMIN HCL ER 500 MG PO TB24
500.0000 mg | ORAL_TABLET | Freq: Every day | ORAL | Status: DC
  Administered 2012-10-04 – 2012-10-05 (×2): 500 mg via ORAL
  Filled 2012-10-04 (×4): qty 1

## 2012-10-04 MED ORDER — POLYETHYLENE GLYCOL 3350 17 G PO PACK
17.0000 g | PACK | Freq: Every day | ORAL | Status: DC
  Administered 2012-10-04 – 2012-10-05 (×2): 17 g via ORAL

## 2012-10-04 NOTE — Progress Notes (Signed)
ATTENDING ADDENDUM:  I personally reviewed patient's record, examined the patient, and formulated the following plan:  Doing well.  So far has tolerated regular diet.  Feels better but no real BM yet.  Hopefully will continue to improve and d/c to home tomorrow.

## 2012-10-04 NOTE — Progress Notes (Signed)
Patient ID: Nicholas [name], male   DOB: 01/31/1941, 71 y.o.   MRN: [id number] 7 Days Post-Op  Subjective: Pt feels better today.  Tolerating fulls.  Flatus, minimal BM yesterday.  Less bloated.  Objective: Vital signs in last 24 hours: Temp:  [98 F (36.7 C)-98.8 F (37.1 C)] 98.4 F (36.9 C) (12/26 0549) Pulse Rate:  [70-76] 70  (12/26 0549) Resp:  [18-20] 18  (12/26 0549) BP: (130-164)/(73-78) 164/78 mmHg (12/26 0549) SpO2:  [96 %-98 %] 96 % (12/26 0549) Last BM Date: 10/02/12  Intake/Output from previous day: 12/25 0701 - 12/26 0700 In: 2651.3 [P.O.:840; I.V.:1811.3] Out: 65 [Drains:65] Intake/Output this shift: Total I/O In: 660 [P.O.:360; I.V.:300] Out: 430 [Urine:400; Drains:30]  PE: Abd: softer, less distended, JP with serosang output, appropriately tender, wound is clean and packed.  Incision with staples and is c/d/i  Lab Results:   Basename 10/04/12 0435 10/02/12 0420  WBC 4.6 5.1  HGB 9.1* 9.3*  HCT 26.9* 26.8*  PLT 178 162   BMET  Basename 10/04/12 0435  NA 137  K 3.5  CL 103  CO2 27  GLUCOSE 106*  BUN 7  CREATININE 0.91  CALCIUM 8.8   PT/INR No results found for this basename: LABPROT:2,INR:2 in the last 72 hours CMP     Component Value Date/Time   NA 137 10/04/2012 0435   K 3.5 10/04/2012 0435   CL 103 10/04/2012 0435   CO2 27 10/04/2012 0435   GLUCOSE 106* 10/04/2012 0435   BUN 7 10/04/2012 0435   CREATININE 0.91 10/04/2012 0435   CREATININE 0.98 11/18/2011 1624   CALCIUM 8.8 10/04/2012 0435   PROT 7.1 09/21/2012 1005   ALBUMIN 3.7 09/21/2012 1005   AST 21 09/21/2012 1005   ALT 21 09/21/2012 1005   ALKPHOS 61 09/21/2012 1005   BILITOT 0.3 09/21/2012 1005   GFRNONAA 84* 10/04/2012 0435   GFRAA >90 10/04/2012 0435   Lipase     Component Value Date/Time   LIPASE 18 12/23/2011 1035       Studies/Results: No results found.  Anti-infectives: Anti-infectives     Start     Dose/Rate Route Frequency Ordered Stop   09/27/12 1800    cefOXitin (MEFOXIN) 1 g in dextrose 5 % 50 mL IVPB        1 g 100 mL/hr over 30 Minutes Intravenous Every 6 hours 09/27/12 1635 09/28/12 0613   09/27/12 0837   cefOXitin (MEFOXIN) 2 g in dextrose 5 % 50 mL IVPB        2 g 100 mL/hr over 30 Minutes Intravenous On call to O.R. 09/27/12 0837 09/27/12 1157           Assessment/Plan  1. S/p colostomy takedown 2. DM 3. Mild post op ileus, improving  Plan: 1. Advance to carb mod diet 2. Hold heparin for today secondary to epistaxis, resume Heparin in am 3. Miralax daily as patient very concerned about constipation. 4. SL IV 5. Hopefully home tomorrow if doing well.   LOS: 7 days    Nikiyah Fackler E 10/04/2012, 11:13 AM Pager: [phone]

## 2012-10-05 LAB — GLUCOSE, CAPILLARY
Glucose-Capillary: 100 mg/dL — ABNORMAL HIGH (ref 70–99)
Glucose-Capillary: 106 mg/dL — ABNORMAL HIGH (ref 70–99)

## 2012-10-05 MED ORDER — OXYCODONE-ACETAMINOPHEN 5-325 MG PO TABS: 1.0000 | ORAL_TABLET | ORAL | Status: DC | PRN

## 2012-10-05 NOTE — Discharge Summary (Signed)
Patient ID: Nicholas [name] MRN: [id number] DOB/AGE: 1941/02/14 71 y.o.  Admit date: 09/27/2012 Discharge date: 10/05/2012  Procedures: Laparoscopic lysis of adhesions (45 minutes), segmental colectomy, and colostomy closure  Consults: None  Reason for Admission: He had perforated sigmoid diverticulitis in March of this year and underwent an emergency sigmoid colectomy and colostomy. He had a slow postoperative recovery but is now ready for colostomy closure.  Admission Diagnoses:  Patient Active Problem List  Diagnosis  . HYPERLIPIDEMIA  . SLEEP APNEA, OBSTRUCTIVE  . CHRONIC PAIN DUE TO TRAUMA  . OTHER ACUTE SINUSITIS  . DIABETES MELLITUS, BORDERLINE  . BENIGN PROSTATIC HYPERTROPHY, MILD, HX OF  . MORTON'S NEUROMA, RIGHT  . METATARSALGIA  . TIBIALIS TENDINITIS  . PES PLANUS  . HALLUX RIGIDUS, ACQUIRED  . OTHER CONGENITAL VALGUS DEFORMITY OF FEET  . PERIPHERAL NEUROPATHY  . Diabetic neuropathy  . Knee MCL sprain  . Knee pain  . Lower extremity edema  . Diverticulitis of colon with perforation  . Colostomy care  . Discoloration of skin of toe  . Vascular abnormality  . Murmur, cardiac    Hospital Course: The patient was admitted and taken to the operating room where he underwent a reversal of his colostomy.  The patient was given Entereg.  He tolerated the procedure well.  He was started on clear liquids, but maintained here for several days due to abdominal distention and some bloating.  He had some bloody BMs on POD#3 and his DVT prophylaxis was held.  His hgb stabilized out and he stopped having bloody BMs and his prophylaxis was resumed the following day.  He was advanced to full liquids on POD#4, but also remained here for several days.  He was passing some flatus, but still distended.  Finally on POD# 7, he was much less bloated and had better bowel function.  His diet was able to be advanced.  He tolerated this well.  Hid JP remained serosanguinous and was dc prior to  discharge home.  His wound remained clean and packed as well.  The patient was otherwise stable for dc home on POD# 8.  Discharge Diagnoses:  Principal Problem:  *Descending colostomy s/p reversal 09/27/12 Active Problems:  SLEEP APNEA, OBSTRUCTIVE  DIABETES MELLITUS, BORDERLINE   Discharge Medications:   Medication List     As of 10/05/2012  1:49 PM    STOP taking these medications         erythromycin base 500 MG tablet   Commonly known as: E-MYCIN      sulfamethoxazole-trimethoprim 800-160 MG per tablet   Commonly known as: BACTRIM DS,SEPTRA DS      traMADol 50 MG tablet   Commonly known as: ULTRAM      TAKE these medications         acetaminophen 500 MG tablet   Commonly known as: TYLENOL   Take 1,000 mg by mouth every 6 (six) hours as needed. For pain.      aspirin EC 81 MG tablet   Take 81 mg by mouth daily.      FLOMAX 0.4 MG Caps   Generic drug: Tamsulosin HCl   Take 0.8 mg by mouth at bedtime.      guaiFENesin 600 MG 12 hr tablet   Commonly known as: MUCINEX   Take 600 mg by mouth 2 (two) times daily.      metformin 500 MG (OSM) 24 hr tablet   Commonly known as: FORTAMET   Take 1 tablet (500 mg total)  by mouth daily.      naproxen sodium 220 MG tablet   Commonly known as: ANAPROX   Take 220 mg by mouth 2 (two) times daily as needed. For pain      oxyCODONE-acetaminophen 5-325 MG per tablet   Commonly known as: PERCOCET/ROXICET   Take 1-2 tablets by mouth every 4 (four) hours as needed.        Discharge Instructions:     Follow-up Information    Follow up with ROSENBOWER,TODD J, MD. In 3 weeks. (our office will call you)    Contact information:   [address] [place] [address] [phone]        Milas Hock early next week for staple removal.  Signed: OSBORNE,KELLY E 10/05/2012, 1:49 PM  Agree with above.  Ovidio Kin, MD [facility] [phone]

## 2012-10-05 NOTE — Progress Notes (Signed)
Patient ID: Nicholas [name], male   DOB: [birth year], 71 y.o.   MRN: [id number] 8 Days Post-Op  Subjective: Pt feels well except concerned he has not had a BM yet.  Otherwise tolerating a solid diet  Objective: Vital signs in last 24 hours: Temp:  [98.2 F (36.8 C)-99.4 F (37.4 C)] 98.2 F (36.8 C) (12/27 0538) Pulse Rate:  [65-71] 71  (12/27 0538) Resp:  [16-18] 16  (12/27 0538) BP: (117-135)/(59-77) 135/77 mmHg (12/27 0538) SpO2:  [96 %-97 %] 97 % (12/27 0538) Last BM Date: 10/02/12  Intake/Output from previous day: 12/26 0701 - 12/27 0700 In: 1020 [P.O.:720; I.V.:300] Out: 765 [Urine:700; Drains:65] Intake/Output this shift:    PE: Abd: soft, minimally tender, wound clean and packed, incisions with staples are c/d/i.  JP serosang. +BS  Lab Results:   [facility] 10/04/12 0435  WBC 4.6  HGB 9.1*  HCT 26.9*  PLT 178   BMET  Basename 10/04/12 0435  NA 137  K 3.5  CL 103  CO2 27  GLUCOSE 106*  BUN 7  CREATININE 0.91  CALCIUM 8.8   PT/INR No results found for this basename: LABPROT:2,INR:2 in the last 72 hours CMP     Component Value Date/Time   NA 137 10/04/2012 0435   K 3.5 10/04/2012 0435   CL 103 10/04/2012 0435   CO2 27 10/04/2012 0435   GLUCOSE 106* 10/04/2012 0435   BUN 7 10/04/2012 0435   CREATININE 0.91 10/04/2012 0435   CREATININE 0.98 11/18/2011 1624   CALCIUM 8.8 10/04/2012 0435   PROT 7.1 09/21/2012 1005   ALBUMIN 3.7 09/21/2012 1005   AST 21 09/21/2012 1005   ALT 21 09/21/2012 1005   ALKPHOS 61 09/21/2012 1005   BILITOT 0.3 09/21/2012 1005   GFRNONAA 84* 10/04/2012 0435   GFRAA >90 10/04/2012 0435   Lipase     Component Value Date/Time   LIPASE 18 12/23/2011 1035       Studies/Results: No results found.  Anti-infectives: Anti-infectives     Start     Dose/Rate Route Frequency Ordered Stop   09/27/12 1800   cefOXitin (MEFOXIN) 1 g in dextrose 5 % 50 mL IVPB        1 g 100 mL/hr over 30 Minutes Intravenous Every 6 hours 09/27/12  1635 09/28/12 0613   09/27/12 0837   cefOXitin (MEFOXIN) 2 g in dextrose 5 % 50 mL IVPB        2 g 100 mL/hr over 30 Minutes Intravenous On call to O.R. 09/27/12 0837 09/27/12 1157           Assessment/Plan  1. S/p colostomy takedown  Laparoscopic lysis of adhesions (45 minutes), segmental colectomy, and colostomy closure - 09/27/12 - Dr. Abbey Chatters. 2. DM  Plan: 1. Patient hesitant to have a glycerin suppository.  He wants to try prune juice and prunes first as this usually works well for him.  Once he has a BM, then he can go home.  JP drain will be removed prior to dc.   LOS: 8 days    OSBORNE,KELLY E 10/05/2012, 11:31 AM  He has now had a good BM and is ready to go home. His wife is in the room. His discharge instructions have been reviewed.  Ovidio Kin, MD, [facility] Pager: [phone] Office phone:  [phone]  Pager: [phone]

## 2012-10-08 ENCOUNTER — Ambulatory Visit (INDEPENDENT_AMBULATORY_CARE_PROVIDER_SITE_OTHER): Payer: No Typology Code available for payment source | Admitting: General Surgery

## 2012-10-08 ENCOUNTER — Encounter (INDEPENDENT_AMBULATORY_CARE_PROVIDER_SITE_OTHER): Payer: Self-pay

## 2012-10-08 VITALS — BP 136/70 | HR 80 | Temp 97.3°F | Resp 18 | Ht 72.0 in | Wt 225.4 lb

## 2012-10-08 DIAGNOSIS — Z4802 Encounter for removal of sutures: Secondary | ICD-10-CM

## 2012-10-08 NOTE — Progress Notes (Signed)
Patient came in today for staple removal s/p colostomy.  I cleansed the skin with chloraprep and then removed the staples.  I then applied benzoin and steri-strips over the incision.  The patient did not complain of any n/v/f and handled this procedure well.  The skin was slightly irritated from the staples but only normal post-operative appearances.

## 2012-10-11 ENCOUNTER — Telehealth (INDEPENDENT_AMBULATORY_CARE_PROVIDER_SITE_OTHER): Payer: Self-pay

## 2012-10-11 NOTE — Telephone Encounter (Signed)
The wife called requesting a refill on the Percocet.  She states maybe just one more prescription will be enough to get him through.  He had a colectomy and ostomy closure on 12/19.  Dr Abbey Chatters is unavailable so I will ask another physician.  The wife states they can pick it up tomorrow.

## 2012-10-12 ENCOUNTER — Telehealth (INDEPENDENT_AMBULATORY_CARE_PROVIDER_SITE_OTHER): Payer: Self-pay

## 2012-10-12 NOTE — Telephone Encounter (Signed)
[facility] Rx for Percocet 5/325 #40 has been signed and is waiting at front desk for the pt to p/u.

## 2012-10-17 ENCOUNTER — Encounter (INDEPENDENT_AMBULATORY_CARE_PROVIDER_SITE_OTHER): Payer: Self-pay | Admitting: General Surgery

## 2012-10-17 ENCOUNTER — Ambulatory Visit (INDEPENDENT_AMBULATORY_CARE_PROVIDER_SITE_OTHER): Payer: Medicare Other | Admitting: General Surgery

## 2012-10-17 VITALS — BP 132/68 | HR 70 | Temp 97.9°F | Resp 16 | Ht 72.0 in | Wt 229.2 lb

## 2012-10-17 DIAGNOSIS — Z9889 Other specified postprocedural states: Secondary | ICD-10-CM

## 2012-10-17 NOTE — Progress Notes (Signed)
Procedure:  Laparoscopic assisted colostomy closure  Date:  09/27/2012  Pathology:  Benign  History:  He is here for his first postoperative visit. His bowels are working well. His wife is doing wet to dry dressing changes to his open left upper quadrant wound.  Exam: General- Is in NAD. Abdomen-left upper quadrant wound is clean and healing in by secondary intention and limited lower midline incision and trocar incisions are clean and intact.  Assessment:  Doing well postoperatively.  Plan:  Continue current wound care. Continue light activities. Start high fiber diet and we discussed this. Return visit 3-4 weeks.

## 2012-10-17 NOTE — Patient Instructions (Signed)
Continue light activities. Start high-fiber diet as discussed. May drive.

## 2012-10-24 ENCOUNTER — Ambulatory Visit (INDEPENDENT_AMBULATORY_CARE_PROVIDER_SITE_OTHER): Payer: Medicare Other | Admitting: Family Medicine

## 2012-10-24 VITALS — BP 120/60 | HR 64 | Temp 98.0°F | Wt 232.0 lb

## 2012-10-24 DIAGNOSIS — E1142 Type 2 diabetes mellitus with diabetic polyneuropathy: Secondary | ICD-10-CM

## 2012-10-24 DIAGNOSIS — E1149 Type 2 diabetes mellitus with other diabetic neurological complication: Secondary | ICD-10-CM | POA: Diagnosis not present

## 2012-10-24 DIAGNOSIS — E1140 Type 2 diabetes mellitus with diabetic neuropathy, unspecified: Secondary | ICD-10-CM

## 2012-10-24 MED ORDER — PREGABALIN 75 MG PO CAPS: 75.0000 mg | ORAL_CAPSULE | Freq: Two times a day (BID) | ORAL | Status: DC

## 2012-10-24 NOTE — Progress Notes (Signed)
72 yo here to discuss his diabetic neuropathy. Doing well post operatively.  He has a h/o diabetic neuropathy with minimal feeling in his toes. Lab Results  Component Value Date   HGBA1C 5.8* 11/18/2011    Has tried gabapentin in past for neuropathy.  Did not like side effects and did not feel it was helpful.  On Tramadol now which he feel is more effective but at times, feels his neuropathy is often unbearable. Cannot elevated his legs without feeling "like I am standing on ice- burning/cold/sensation of pin pricks."   Patient Active Problem List  Diagnosis  . HYPERLIPIDEMIA  . SLEEP APNEA, OBSTRUCTIVE  . CHRONIC PAIN DUE TO TRAUMA  . DIABETES MELLITUS, BORDERLINE  . BENIGN PROSTATIC HYPERTROPHY, MILD, HX OF  . PES PLANUS  . HALLUX RIGIDUS, ACQUIRED  . OTHER CONGENITAL VALGUS DEFORMITY OF FEET  . PERIPHERAL NEUROPATHY  . Diabetic neuropathy  . Diverticulitis of colon with perforation  . Colostomy care  . Discoloration of skin of toe  . Vascular abnormality  . Murmur, cardiac  . Descending colostomy s/p reversal 09/27/12   Past Medical History  Diagnosis Date  . Diabetes mellitus     boarderline  . BPH (benign prostatic hypertrophy)   . Neuropathy   . OSA on CPAP     cpap- doe snot know settings   . Arthritis   . Sinus infection     dx 12/13   . Heart murmur     noted on visit of 09/20/12- [facility]   . Blister of foot or toe     left ssecond toe healing per note of 09/21/12    Past Surgical History  Procedure Date  . Cholecystectomy   . Laparotomy 12/23/2011    Procedure: EXPLORATORY LAPAROTOMY;  Surgeon: Adolph Pollack, MD;  Location: [facility];  Service: General;  Laterality: N/A;  . Appendectomy 12/23/2011    Procedure: APPENDECTOMY;  Surgeon: Adolph Pollack, MD;  Location: [facility];  Service: General;  Laterality: N/A;  . Colostomy revision 12/23/2011    Procedure: COLON RESECTION SIGMOID;  Surgeon: Adolph Pollack, MD;  Location: [facility];  Service: General;   Laterality: N/AGertie Gowda procedure   . Cardiac catheterization     [facility]  . Bilateral hand surgery      metal placed in hands due to trauma in 2011   . Right shoulder surgery      related to trauma   . Colostomy takedown 09/27/2012    Procedure: LAPAROSCOPIC COLOSTOMY TAKEDOWN;  Surgeon: Adolph Pollack, MD;  Location: [facility];  Service: General;  Laterality: N/A;  Laparoscopic Colostomy Takedown   History  Substance Use Topics  . Smoking status: Former Smoker -- 2.0 packs/day for 40 years    Types: Cigarettes    Quit date: 10/10/1993  . Smokeless tobacco: Former Neurosurgeon  . Alcohol Use: No   Family History  Problem Relation Age of Onset  . Diabetes Mother   . Cancer Mother     skin  . Heart disease Mother   . Heart attack Mother   . Cancer Father     Leukemia  . Colon cancer Neg Hx    Allergies  Allergen Reactions  . Sudafed (Pseudoephedrine Hcl)     "makes me feel drunk"   Current Outpatient Prescriptions on File Prior to Visit  Medication Sig Dispense Refill  . acetaminophen (TYLENOL) 500 MG tablet Take 1,000 mg by mouth every 6 (six) hours as needed. For pain.      Marland Kitchen  aspirin EC 81 MG tablet Take 81 mg by mouth daily.      Marland Kitchen guaiFENesin (MUCINEX) 600 MG 12 hr tablet Take 600 mg by mouth 2 (two) times daily.      . metformin (FORTAMET) 500 MG (OSM) 24 hr tablet Take 1 tablet (500 mg total) by mouth daily.  30 tablet  6  . naproxen sodium (ANAPROX) 220 MG tablet Take 220 mg by mouth 2 (two) times daily as needed. For pain      . Tamsulosin HCl (FLOMAX) 0.4 MG CAPS Take 0.8 mg by mouth at bedtime.       . traMADol (ULTRAM) 50 MG tablet Take 50 mg by mouth every 6 (six) hours as needed.      . [DISCONTINUED] hydrochlorothiazide (MICROZIDE) 12.5 MG capsule Take 1 capsule (12.5 mg total) by mouth daily.  30 capsule  0   The PMH, PSH, Social History, Family History, Medications, and allergies have been reviewed in [facility], and have been updated if relevant.   REVIEW OF SYSTEMS   GEN: No systemic complaints, no fevers, chills, sweats, or other acute illnesses   Physical Exam  BP 120/60  Pulse 64  Temp 98 F (36.7 C)  Wt 232 lb (105.235 kg)  General: GEN: Well-developed,well-nourished,in no acute distress; alert,appropriate and cooperative throughout examination  PSYCH: Normally interactive. Cooperative during the interview. Pleasant. Friendly and conversant. Not anxious or depressed appearing. Normal, full affect.   Impression & Recommendations:    1. Diabetic neuropathy    >15 min spent with face to face with patient, >50% counseling and/or coordinating care.  Discussed different treatment options.  We will try Lyrica- start at out at 75 mg twice daily. Pt to call in a few weeks with an update.  The patient indicates understanding of these issues and agrees with the plan.

## 2012-10-24 NOTE — Patient Instructions (Addendum)
Call me in a couple of weeks with an update.  Pregabalin capsules What is this medicine? PREGABALIN (pre GAB a lin) is used to treat nerve pain from diabetes, shingles, spinal cord injury, and fibromyalgia. It is also used to control seizures in epilepsy. This medicine may be used for other purposes; ask your health care provider or pharmacist if you have questions. What should I tell my health care provider before I take this medicine? They need to know if you have any of these conditions: -bleeding problems -heart disease, including heart failure -history of alcohol or drug abuse -kidney disease -suicidal thoughts, plans, or attempt; a previous suicide attempt by you or a family member -an unusual or allergic reaction to pregabalin, gabapentin, other medicines, foods, dyes, or preservatives -pregnant or trying to get pregnant or trying to conceive with your partner -breast-feeding How should I use this medicine? Take this medicine by mouth with a glass of water. Follow the directions on the prescription label. You can take this medicine with or without food. Take your doses at regular intervals. Do not take your medicine more often than directed. Do not stop taking except on your doctor's advice. A special MedGuide will be given to you by the pharmacist with each prescription and refill. Be sure to read this information carefully each time. Talk to your pediatrician regarding the use of this medicine in children. Special care may be needed. Overdosage: If you think you have taken too much of this medicine contact a poison control center or emergency room at once. NOTE: This medicine is only for you. Do not share this medicine with others. What if I miss a dose? If you miss a dose, take it as soon as you can. If it is almost time for your next dose, take only that dose. Do not take double or extra doses. What may interact with this medicine? -alcohol -certain medicines for blood pressure  like captopril, enalapril, or lisinopril -certain medicines for diabetes, like pioglitazone or rosiglitazone -certain medicines for anxiety or sleep -narcotic medicines for pain This list may not describe all possible interactions. Give your health care provider a list of all the medicines, herbs, non-prescription drugs, or dietary supplements you use. Also tell them if you smoke, drink alcohol, or use illegal drugs. Some items may interact with your medicine. What should I watch for while using this medicine? Tell your doctor or healthcare professional if your symptoms do not start to get better or if they get worse. Visit your doctor or health care professional for regular checks on your progress. Do not stop taking except on your doctor's advice. You may develop a severe reaction. Your doctor will tell you how much medicine to take. Wear a medical identification bracelet or chain if you are taking this medicine for seizures, and carry a card that describes your disease and details of your medicine and dosage times. You may get drowsy or dizzy. Do not drive, use machinery, or do anything that needs mental alertness until you know how this medicine affects you. Do not stand or sit up quickly, especially if you are an older patient. This reduces the risk of dizzy or fainting spells. Alcohol may interfere with the effect of this medicine. Avoid alcoholic drinks. If you have a heart condition, like congestive heart failure, and notice that you are retaining water and have swelling in your hands or feet, contact your health care provider immediately. The use of this medicine may increase the chance of  suicidal thoughts or actions. Pay special attention to how you are responding while on this medicine. Any worsening of mood, or thoughts of suicide or dying should be reported to your health care professional right away. This medicine has caused reduced sperm counts in some men. This may interfere with the  ability to father a child. You should talk to your doctor or health care professional if you are concerned about your fertility. Women who become pregnant while using this medicine for seizures may enroll in the [place] American Antiepileptic Drug Pregnancy Registry by calling [phone]. This registry collects information about the safety of antiepileptic drug use during pregnancy. What side effects may I notice from receiving this medicine? Side effects that you should report to your doctor or health care professional as soon as possible: -allergic reactions like skin rash, itching or hives, swelling of the face, lips, or tongue -breathing problems -changes in vision -chest pain -confusion -jerking or unusual movements of any part of your body -loss of memory -muscle pain, tenderness, or weakness -suicidal thoughts or other mood changes -swelling of the ankles, feet, hands -unusual bruising or bleeding Side effects that usually do not require medical attention (Report these to your doctor or health care professional if they continue or are bothersome.): -dizziness -drowsiness -dry mouth -headache -nausea -tremors -trouble sleeping -weight gain This list may not describe all possible side effects. Call your doctor for medical advice about side effects. You may report side effects to FDA at 1-800-FDA-1088. Where should I keep my medicine? Keep out of the reach of children. This medicine can be abused. Keep your medicine in a safe place to protect it from theft. Do not share this medicine with anyone. Selling or giving away this medicine is dangerous and against the law. Store at room temperature between 15 and 30 degrees C (59 and 86 degrees F). Throw away any unused medicine after the expiration date. NOTE: This sheet is a summary. It may not cover all possible information. If you have questions about this medicine, talk to your doctor, pharmacist, or health care provider.  2013,  [employer]vier/[employer] Standard. (03/31/2011 8:00:36 PM)

## 2012-10-29 ENCOUNTER — Telehealth: Payer: Self-pay

## 2012-10-29 NOTE — Telephone Encounter (Signed)
Mary left v/m that [facility] told [facility] Lyrica cannot be filled until  [facility] why med prescribed;? Prior auth. [facility] request call back re: Lyrica prescription.

## 2012-10-29 NOTE — Telephone Encounter (Signed)
Prior auth needed for lyrica, form from [employer] is on your desk.

## 2012-10-30 ENCOUNTER — Telehealth: Payer: Self-pay | Admitting: Family Medicine

## 2012-10-30 NOTE — Telephone Encounter (Signed)
Form completed and on my desk. 

## 2012-10-30 NOTE — Telephone Encounter (Signed)
Actually, he is taking Tramadol which could interact with Cymbalta- very rarely but could cause serotonin syndrome.  I would prefer to appeal the denial.  Please find out what I need to do to appeal this.

## 2012-10-30 NOTE — Telephone Encounter (Signed)
Spoke with patient, he is willing to try cymbalta.

## 2012-10-30 NOTE — Telephone Encounter (Signed)
Pt is unable to get Lyrica RX filled because insurance will not approve it.  Please advise.

## 2012-10-30 NOTE — Telephone Encounter (Signed)
Spoke with [employer].  Patient has to have tried and failed cymbalta before lyrica will be covered.

## 2012-10-30 NOTE — Telephone Encounter (Signed)
Noted.  Please ask him if he would be willing to try cymbalta first.

## 2012-10-30 NOTE — Telephone Encounter (Signed)
Can we appeal denial?

## 2012-10-31 NOTE — Telephone Encounter (Signed)
Form faxed

## 2012-10-31 NOTE — Telephone Encounter (Signed)
Form is on your desk for your review and signature.  With additional information, hopefully insurance will approve.

## 2012-10-31 NOTE — Telephone Encounter (Signed)
Thank you.  Form signed and on my desk.

## 2012-11-01 NOTE — Telephone Encounter (Signed)
Prior auth given for lyrica.  Advised patient's wife.  Approval letter placed on doctor's desk for signature and scanning.

## 2012-11-05 NOTE — Telephone Encounter (Signed)
Megan with [facility] request status of Lyrica prior auth;call [phone].

## 2012-11-05 NOTE — Telephone Encounter (Signed)
Advised her prior Berkley Harvey was given, but she said the copay is $250.00.

## 2012-11-06 ENCOUNTER — Other Ambulatory Visit: Payer: Medicare Other

## 2012-11-12 ENCOUNTER — Other Ambulatory Visit: Payer: Self-pay | Admitting: *Deleted

## 2012-11-12 MED ORDER — METFORMIN HCL ER (OSM) 500 MG PO TB24: 500.0000 mg | ORAL_TABLET | Freq: Every day | ORAL | Status: DC

## 2012-11-14 ENCOUNTER — Ambulatory Visit (INDEPENDENT_AMBULATORY_CARE_PROVIDER_SITE_OTHER): Payer: Medicare Other | Admitting: General Surgery

## 2012-11-14 ENCOUNTER — Encounter (INDEPENDENT_AMBULATORY_CARE_PROVIDER_SITE_OTHER): Payer: Self-pay

## 2012-11-14 ENCOUNTER — Encounter (INDEPENDENT_AMBULATORY_CARE_PROVIDER_SITE_OTHER): Payer: Self-pay | Admitting: General Surgery

## 2012-11-14 VITALS — BP 156/84 | HR 84 | Temp 97.3°F | Resp 16 | Ht 72.0 in | Wt 238.0 lb

## 2012-11-14 DIAGNOSIS — Z9889 Other specified postprocedural states: Secondary | ICD-10-CM

## 2012-11-14 NOTE — Patient Instructions (Signed)
Resume normal activities as tolerated as we discussed. Avoid repetitive heavy lifting. Dry dressing to wound. Call if wound is not completely healed in 2 months.

## 2012-11-14 NOTE — Progress Notes (Signed)
Procedure:  Laparoscopic assisted colostomy closure  Date:  09/27/2012  Pathology:  Benign  History:  He is here for his second postoperative visit. His bowels are working well. He is eating well. He is nearly back to his normal energy level.  Exam: General- Is in NAD. Abdomen-left upper quadrant wound is clean and nearly healed-silver nitrate was applied to a small amount of residue granulation tissue; limited lower midline incision and trocar incisions are clean and intact; periumbilical incisional hernia is present  Assessment:  Continues to do well.  Has an incisional hernia that is asymptomatic.  Plan: Resume normal activities as tolerated.  Lose weight.  Return prn.

## 2012-11-27 ENCOUNTER — Encounter: Payer: Self-pay | Admitting: Family Medicine

## 2012-11-27 ENCOUNTER — Ambulatory Visit (INDEPENDENT_AMBULATORY_CARE_PROVIDER_SITE_OTHER): Payer: Medicare Other | Admitting: General Surgery

## 2012-11-27 ENCOUNTER — Encounter (INDEPENDENT_AMBULATORY_CARE_PROVIDER_SITE_OTHER): Payer: Self-pay | Admitting: General Surgery

## 2012-11-27 ENCOUNTER — Ambulatory Visit (INDEPENDENT_AMBULATORY_CARE_PROVIDER_SITE_OTHER): Payer: Medicare Other | Admitting: Family Medicine

## 2012-11-27 VITALS — BP 148/80 | HR 92 | Temp 98.9°F | Resp 18 | Ht 72.0 in | Wt 242.2 lb

## 2012-11-27 VITALS — BP 142/78 | HR 87 | Temp 97.9°F | Wt 243.8 lb

## 2012-11-27 DIAGNOSIS — L0390 Cellulitis, unspecified: Secondary | ICD-10-CM

## 2012-11-27 DIAGNOSIS — L02212 Cutaneous abscess of back [any part, except buttock]: Secondary | ICD-10-CM

## 2012-11-27 DIAGNOSIS — L02219 Cutaneous abscess of trunk, unspecified: Secondary | ICD-10-CM

## 2012-11-27 DIAGNOSIS — L0291 Cutaneous abscess, unspecified: Secondary | ICD-10-CM

## 2012-11-27 MED ORDER — DOXYCYCLINE HYCLATE 50 MG PO CAPS: 50.0000 mg | ORAL_CAPSULE | Freq: Two times a day (BID) | ORAL | Status: DC

## 2012-11-27 MED ORDER — GABAPENTIN 300 MG PO CAPS: 300.0000 mg | ORAL_CAPSULE | Freq: Three times a day (TID) | ORAL | Status: DC

## 2012-11-27 NOTE — Progress Notes (Signed)
Patient ID: Nicholas [name], male   DOB: August 25, 1941, 72 y.o.   MRN: [id number] History: This patient has a long history of a large sebaceous cyst on his mid back. He says it was drained in 1998. He now presents with a several-day history of progressive swelling and pain and infection. He saw Dr. Dayton Martes, his primary care physician and has been given a prescription for doxycycline today.  Exam: Pleasant gentleman in minimal distress. 6 cm abscessed sebaceous cyst of mid back. This was prepped with Betadine, anesthetized with 35 cc of 1% Xylocaine with epinephrine, incision and drainage performed. Debrided most of the cyst but could not get all because  of pain. Packed with saline gauze and covered with  dry gauze. Tolerated well  Assessment: Abscessed sebaceous  cyst, 6 cm, mid back, drainage and debridement today  Plan: Take doxycycline until all gone Percocet 7.5-325 prescription, 20 tablets given the patient hand written Wound care instructions given. Twice a day dressing change and shower Return to see me in 2 weeks.   Angelia Mould. Derrell Lolling, M.D., [facility] General and Minimally invasive Surgery Breast and Colorectal Surgery Office:   [phone] Pager:   [phone]

## 2012-11-27 NOTE — Progress Notes (Signed)
Subjective:    Patient ID: Nicholas [name], male    DOB: 04/21/1941, 72 y.o.   MRN: [id number]  HPI  Very pleasant 72 yo male here for enlarging and painful abscess left upper back.  He could feel it growing in size several weeks ago but starting becoming very painful and draining thick fluid two days ago. No fevers or chills. No n/v/d.  Has had a similar abscess in this location years ago.  Patient Active Problem List  Diagnosis  . HYPERLIPIDEMIA  . SLEEP APNEA, OBSTRUCTIVE  . CHRONIC PAIN DUE TO TRAUMA  . DIABETES MELLITUS, BORDERLINE  . BENIGN PROSTATIC HYPERTROPHY, MILD, HX OF  . PES PLANUS  . HALLUX RIGIDUS, ACQUIRED  . OTHER CONGENITAL VALGUS DEFORMITY OF FEET  . PERIPHERAL NEUROPATHY  . Diabetic neuropathy  . Diverticulitis of colon with perforation  . Colostomy care  . Discoloration of skin of toe  . Vascular abnormality  . Murmur, cardiac  . Descending colostomy s/p reversal 09/27/12   Past Medical History  Diagnosis Date  . Diabetes mellitus     boarderline  . BPH (benign prostatic hypertrophy)   . Neuropathy   . OSA on CPAP     cpap- doe snot know settings   . Arthritis   . Sinus infection     dx 12/13   . Heart murmur     noted on visit of 09/20/12- [facility]   . Blister of foot or toe     left ssecond toe healing per note of 09/21/12    Past Surgical History  Procedure Laterality Date  . Cholecystectomy    . Laparotomy  12/23/2011    Procedure: EXPLORATORY LAPAROTOMY;  Surgeon: Adolph Pollack, MD;  Location: [facility];  Service: General;  Laterality: N/A;  . Appendectomy  12/23/2011    Procedure: APPENDECTOMY;  Surgeon: Adolph Pollack, MD;  Location: [facility];  Service: General;  Laterality: N/A;  . Colostomy revision  12/23/2011    Procedure: COLON RESECTION SIGMOID;  Surgeon: Adolph Pollack, MD;  Location: [facility];  Service: General;  Laterality: N/AGertie Gowda procedure    . Cardiac catheterization      [facility]  . Bilateral hand surgery       metal  placed in hands due to trauma in 2011   . Right shoulder surgery       related to trauma   . Colostomy takedown  09/27/2012    Procedure: LAPAROSCOPIC COLOSTOMY TAKEDOWN;  Surgeon: Adolph Pollack, MD;  Location[facility];  Service: General;  Laterality: N/A;  Laparoscopic Colostomy Takedown   History  Substance Use Topics  . Smoking status: Former Smoker -- 2.00 packs/day for 40 years    Types: Cigarettes    Quit date: 10/10/1993  . Smokeless tobacco: Former Neurosurgeon  . Alcohol Use: No   Family History  Problem Relation Age of Onset  . Diabetes Mother   . Cancer Mother     skin  . Heart disease Mother   . Heart attack Mother   . Cancer Father     Leukemia  . Colon cancer Neg Hx    Allergies  Allergen Reactions  . Sudafed (Pseudoephedrine Hcl)     "makes me feel drunk"   Current Outpatient Prescriptions on File Prior to Visit  Medication Sig Dispense Refill  . acetaminophen (TYLENOL) 500 MG tablet Take 1,000 mg by mouth every 6 (six) hours as needed. For pain.      Marland Kitchen guaiFENesin (  MUCINEX) 600 MG 12 hr tablet Take 600 mg by mouth 2 (two) times daily.      . metformin (FORTAMET) 500 MG (OSM) 24 hr tablet Take 1 tablet (500 mg total) by mouth daily.  30 tablet  5  . naproxen sodium (ANAPROX) 220 MG tablet Take 220 mg by mouth 2 (two) times daily as needed. For pain      . Tamsulosin HCl (FLOMAX) 0.4 MG CAPS Take 0.8 mg by mouth at bedtime.       . traMADol (ULTRAM) 50 MG tablet Take 50 mg by mouth every 6 (six) hours as needed.      Marland Kitchen aspirin EC 81 MG tablet Take 81 mg by mouth daily.      . [DISCONTINUED] hydrochlorothiazide (MICROZIDE) 12.5 MG capsule Take 1 capsule (12.5 mg total) by mouth daily.  30 capsule  0   No current facility-administered medications on file prior to visit.   The PMH, PSH, Social History, Family History, Medications, and allergies have been reviewed in [facility], and have been updated if relevant.   Review of Systems See HPI    Objective:   Physical  Exam  Constitutional: He appears well-developed and well-nourished. No distress.  Skin: Skin is warm.     Psychiatric: He has a normal mood and affect. His behavior is normal. Judgment and thought content normal.   BP 142/78  Pulse 87  Temp(Src) 97.9 F (36.6 C) (Oral)  Wt 243 lb 12 oz (110.564 kg)  BMI 33.05 kg/m2  SpO2 97%     Assessment & Plan:  1. Cellulitis and abscess Given size of abscess, will refer to Gen surgery for I and D.  Start Doxycyline 100 mg twice daily x 10 days to cover for MRSA. The patient indicates understanding of these issues and agrees with the plan.  - Ambulatory referral to General Surgery

## 2012-11-27 NOTE — Patient Instructions (Addendum)
Please take doxycycline- 1 tablet twice daily for 10 days. See Shirlee Limerick on your way out to set up your surgical appointment.

## 2012-11-27 NOTE — Patient Instructions (Signed)
We drained a large abscess and sebaceous cyst of your back today.  The wound is packed open with gauze.  Tomorrow morning ...remove the gauze packing and take a shower. If possible,  repack the wound lightly with gauze And cover the wound with dry gauze.  Do this twice a day  Take all of the antibiotics until they're gone.  You are given a prescription for Percocet to help with the pain  Return to see Dr. Derrell Lolling in 2 weeks to make sure it is healing properly.

## 2012-11-28 ENCOUNTER — Telehealth (INDEPENDENT_AMBULATORY_CARE_PROVIDER_SITE_OTHER): Payer: Self-pay | Admitting: General Surgery

## 2012-11-28 NOTE — Telephone Encounter (Signed)
Called patient home phone and left message to return call regarding post op appointment set up for him after seeing Dr. Derrell Lolling in urgent office yesterday. Patient set up to be seen on 12/11/12 at 5:00. Requested patient confirm message left and that the date offered will work for his schedule.

## 2012-12-04 ENCOUNTER — Other Ambulatory Visit: Payer: Self-pay

## 2012-12-04 ENCOUNTER — Other Ambulatory Visit (INDEPENDENT_AMBULATORY_CARE_PROVIDER_SITE_OTHER): Payer: Medicare Other

## 2012-12-04 DIAGNOSIS — R011 Cardiac murmur, unspecified: Secondary | ICD-10-CM | POA: Diagnosis not present

## 2012-12-10 ENCOUNTER — Telehealth (INDEPENDENT_AMBULATORY_CARE_PROVIDER_SITE_OTHER): Payer: Self-pay | Admitting: General Surgery

## 2012-12-10 NOTE — Telephone Encounter (Signed)
Called patient to advise of cancellation in morning schedule. Patient offered to be seen at 11:45 instead of 5:00. Patient agreed to time offered.

## 2012-12-11 ENCOUNTER — Ambulatory Visit (INDEPENDENT_AMBULATORY_CARE_PROVIDER_SITE_OTHER): Payer: Medicare Other | Admitting: General Surgery

## 2012-12-11 ENCOUNTER — Encounter (INDEPENDENT_AMBULATORY_CARE_PROVIDER_SITE_OTHER): Payer: Self-pay | Admitting: General Surgery

## 2012-12-11 VITALS — BP 144/76 | HR 72 | Temp 97.6°F | Resp 20 | Ht 72.0 in | Wt 245.0 lb

## 2012-12-11 DIAGNOSIS — L723 Sebaceous cyst: Secondary | ICD-10-CM

## 2012-12-11 DIAGNOSIS — L089 Local infection of the skin and subcutaneous tissue, unspecified: Secondary | ICD-10-CM

## 2012-12-11 NOTE — Patient Instructions (Signed)
The incision on your back where we removed the infected sebaceous cyst has almost completely healed. Continue to shower once or twice daily. Keep a dry bandage on this until it completely healed. Within 2 weeks it should completely heal and you should not need a bandage.  Return to see Dr. Derrell Lolling if further problems arise

## 2012-12-11 NOTE — Progress Notes (Signed)
Patient ID: Nicholas [name], male   DOB: 12-24-1940, 72 y.o.   MRN: [id number] This gentleman returns for a wound check. On 11/27/2012 I incised, drained, and debrided a giant infected sebaceous cyst of his upper back. He says it feels much better. Minimal drainage.  Exam: Wound in his back has almost completely healed. No purulence. No signs of infection. small strip of granulation tissue. Redressed  Plan: Anticipate complete healing within 2 weeks. Return to see me if further problems arise.   Angelia Mould. Derrell Lolling, M.D., [facility] General and Minimally invasive Surgery Breast and Colorectal Surgery Office:   [phone] Pager:   [phone]

## 2012-12-26 NOTE — Telephone Encounter (Signed)
Rx for Percocet never picked up from front desk and so destroyed on 12/26/12.

## 2013-01-22 DIAGNOSIS — N401 Enlarged prostate with lower urinary tract symptoms: Secondary | ICD-10-CM | POA: Diagnosis not present

## 2013-01-22 DIAGNOSIS — N138 Other obstructive and reflux uropathy: Secondary | ICD-10-CM | POA: Diagnosis not present

## 2013-01-22 DIAGNOSIS — Z125 Encounter for screening for malignant neoplasm of prostate: Secondary | ICD-10-CM | POA: Diagnosis not present

## 2013-01-24 DIAGNOSIS — E119 Type 2 diabetes mellitus without complications: Secondary | ICD-10-CM | POA: Diagnosis not present

## 2013-01-31 LAB — HM DIABETES EYE EXAM

## 2013-03-11 ENCOUNTER — Ambulatory Visit (INDEPENDENT_AMBULATORY_CARE_PROVIDER_SITE_OTHER): Payer: Medicare Other | Admitting: General Surgery

## 2013-03-11 ENCOUNTER — Encounter (INDEPENDENT_AMBULATORY_CARE_PROVIDER_SITE_OTHER): Payer: Self-pay | Admitting: General Surgery

## 2013-03-11 VITALS — BP 152/70 | HR 80 | Temp 97.4°F | Resp 16 | Ht 72.0 in | Wt 253.2 lb

## 2013-03-11 DIAGNOSIS — K432 Incisional hernia without obstruction or gangrene: Secondary | ICD-10-CM | POA: Diagnosis not present

## 2013-03-11 NOTE — Patient Instructions (Signed)
Avoid repetitive heavy lifting. Exercise more as we discussed. If the hernia gets larger or becomes painful, call for an appointment to discuss repair.

## 2013-03-11 NOTE — Progress Notes (Signed)
Patient ID: Nicholas [name], male   DOB: Jan 03, 1941, 72 y.o.   MRN: [id number]  No chief complaint on file.   HPI Nicholas [name] is a 72 y.o. male.   HPI  He presents, self-referred, because of a small bulge is noted at the previous colostomy site in the left abdomen. He noticed it 2 weeks ago. He has gained a significant amount of weight since I saw him last. He's also been doing some heavy work and heavy lifting. It is not painful.  Past Medical History  Diagnosis Date  . Diabetes mellitus     boarderline  . BPH (benign prostatic hypertrophy)   . Neuropathy   . OSA on CPAP     cpap- doe snot know settings   . Arthritis   . Sinus infection     dx 12/13   . Heart murmur     noted on visit of 09/20/12- [facility]   . Blister of foot or toe     left ssecond toe healing per note of 09/21/12     Past Surgical History  Procedure Laterality Date  . Cholecystectomy    . Laparotomy  12/23/2011    Procedure: EXPLORATORY LAPAROTOMY;  Surgeon: Adolph Pollack, MD;  [facility];  Service: General;  Laterality: N/A;  . Appendectomy  12/23/2011    Procedure: APPENDECTOMY;  Surgeon: Adolph Pollack, MD;  [facility];  Service: General;  Laterality: N/A;  . Colostomy revision  12/23/2011    Procedure: COLON RESECTION SIGMOID;  Surgeon: Adolph Pollack, MD;  [facility];  Service: General;  Laterality: N/AGertie Gowda procedure    . Cardiac catheterization      [facility]  . Bilateral hand surgery       metal placed in hands due to trauma in 2011   . Right shoulder surgery       related to trauma   . Colostomy takedown  09/27/2012    Procedure: LAPAROSCOPIC COLOSTOMY TAKEDOWN;  Surgeon: Adolph Pollack, MD;  [facility];  Service: General;  Laterality: N/A;  Laparoscopic Colostomy Takedown  . Incise and drain abcess      sebaceous cyst lanced in office on lt shoulder    Family History  Problem Relation Age of Onset  . Diabetes Mother   . Cancer Mother     skin  . Heart  disease Mother   . Heart attack Mother   . Cancer Father     Leukemia  . Colon cancer Neg Hx     Social History History  Substance Use Topics  . Smoking status: Former Smoker -- 2.00 packs/day for 40 years    Types: Cigarettes    Quit date: 10/10/1993  . Smokeless tobacco: Former Neurosurgeon  . Alcohol Use: No    Allergies  Allergen Reactions  . Sudafed (Pseudoephedrine Hcl)     "makes me feel drunk"    Current Outpatient Prescriptions  Medication Sig Dispense Refill  . acetaminophen (TYLENOL) 500 MG tablet Take 1,000 mg by mouth every 6 (six) hours as needed. For pain.      Marland Kitchen aspirin EC 81 MG tablet Take 81 mg by mouth daily.      Marland Kitchen gabapentin (NEURONTIN) 300 MG capsule Take 1 capsule (300 mg total) by mouth 3 (three) times daily.  90 capsule  3  . guaiFENesin (MUCINEX) 600 MG 12 hr tablet Take 600 mg by mouth 2 (two) times daily.      Marland Kitchen  metformin (FORTAMET) 500 MG (OSM) 24 hr tablet Take 1 tablet (500 mg total) by mouth daily.  30 tablet  5  . naproxen sodium (ANAPROX) 220 MG tablet Take 220 mg by mouth 2 (two) times daily as needed. For pain      . Tamsulosin HCl (FLOMAX) 0.4 MG CAPS Take 0.8 mg by mouth at bedtime.       . traMADol (ULTRAM) 50 MG tablet Take 50 mg by mouth every 6 (six) hours as needed.      . [DISCONTINUED] hydrochlorothiazide (MICROZIDE) 12.5 MG capsule Take 1 capsule (12.5 mg total) by mouth daily.  30 capsule  0   No current facility-administered medications for this visit.    Review of Systems Review of Systems  Constitutional: Negative for fever and chills.  Gastrointestinal: Negative for nausea, vomiting, abdominal pain, diarrhea, constipation and blood in stool.  Genitourinary: Negative for hematuria.    Blood pressure 152/70, pulse 80, temperature 97.4 F (36.3 C), resp. rate 16, height 6' (1.829 m), weight 253 lb 3.2 oz (114.851 kg).  Physical Exam Physical Exam  Constitutional: No distress.  Obese male.  HENT:  Head: Normocephalic and  atraumatic.  Abdominal: Soft. There is no tenderness.  Midline scar is present with a small reducible periumbilical hernia. Left lower quadrant scar is present. At the medial aspect there is a small reducible hernia.  Musculoskeletal:  Longitudinal scar present with some firmness, left back.    Data Reviewed Old notes.  Assessment    Ventral incisional hernia. Likely secondary to significant weight gain as well as a lot of heavy activity. No obstruction symptoms. It is asymptomatic     Plan    We discussed the expected management versus surgery. He like to avoid surgery for now. I've recommended weight loss, exercise program, and avoid repetitive heavy lifting. We discussed incarceration symptoms. If the hernia becomes symptomatic or larger I have asked him to come back as we would need to discuss repair.        Muslima Toppins J 03/11/2013, 9:19 AM

## 2013-04-06 ENCOUNTER — Other Ambulatory Visit: Payer: Self-pay | Admitting: Family Medicine

## 2013-05-01 ENCOUNTER — Other Ambulatory Visit: Payer: Self-pay | Admitting: Family Medicine

## 2013-05-06 ENCOUNTER — Other Ambulatory Visit: Payer: Self-pay | Admitting: Family Medicine

## 2013-05-06 NOTE — Telephone Encounter (Signed)
Refill called to [facility]. 

## 2013-05-22 ENCOUNTER — Other Ambulatory Visit: Payer: Self-pay | Admitting: Family Medicine

## 2013-05-23 ENCOUNTER — Encounter: Payer: Self-pay | Admitting: Radiology

## 2013-05-24 ENCOUNTER — Ambulatory Visit (INDEPENDENT_AMBULATORY_CARE_PROVIDER_SITE_OTHER): Payer: Medicare Other | Admitting: Family Medicine

## 2013-05-24 ENCOUNTER — Encounter: Payer: Self-pay | Admitting: Family Medicine

## 2013-05-24 VITALS — BP 136/82 | HR 80 | Temp 98.0°F | Wt 257.5 lb

## 2013-05-24 DIAGNOSIS — R7309 Other abnormal glucose: Secondary | ICD-10-CM | POA: Diagnosis not present

## 2013-05-24 DIAGNOSIS — G4733 Obstructive sleep apnea (adult) (pediatric): Secondary | ICD-10-CM

## 2013-05-24 MED ORDER — METFORMIN HCL 500 MG PO TABS: 500.0000 mg | ORAL_TABLET | Freq: Two times a day (BID) | ORAL | Status: DC

## 2013-05-24 NOTE — Patient Instructions (Addendum)
Please stop by to see Nicholas [name] on your way out to set up your referral.  We are increasing your Metformin 500 mg twice daily. We will call you with your lab results.

## 2013-05-24 NOTE — Progress Notes (Signed)
Subjective:    Patient ID: Nicholas [name], male    DOB: 1941-06-20, 72 y.o.   MRN: [id number]  HPI 72 yo here for "new Cpap machine."  Was seeing a pulmonologist in [facility].  Has OSA.  Has been using a CPAP for years and has not been titrated.  Was told he needed to establish with someone locally. Mask is over due to be changed.  Mask is "breaking down" and causing him to wake up in the middle of the night.  DM- noticed past few weeks, CBGs are more elevated.  At times as high 200.   Lab Results  Component Value Date   HGBA1C 5.8* 11/18/2011     Patient Active Problem List   Diagnosis Date Noted  . Incisional hernia, without obstruction or gangrene 03/11/2013  . Descending colostomy s/p reversal 09/27/12 09/28/2012  . Murmur, cardiac 09/20/2012  . Vascular abnormality 09/13/2012  . Discoloration of skin of toe 09/05/2012  . Colostomy care 04/05/2012  . Diverticulitis of colon with perforation 12/24/2011  . Diabetic neuropathy 08/25/2011  . PERIPHERAL NEUROPATHY 12/02/2010  . HALLUX RIGIDUS, ACQUIRED 10/14/2010  . OTHER CONGENITAL VALGUS DEFORMITY OF FEET 10/14/2010  . PES PLANUS 10/13/2010  . HYPERLIPIDEMIA 07/01/2010  . CHRONIC PAIN DUE TO TRAUMA 02/26/2010  . SLEEP APNEA, OBSTRUCTIVE 08/28/2009  . DIABETES MELLITUS, BORDERLINE 08/28/2009  . BENIGN PROSTATIC HYPERTROPHY, MILD, HX OF 08/28/2009   Past Medical History  Diagnosis Date  . Diabetes mellitus     boarderline  . BPH (benign prostatic hypertrophy)   . Neuropathy   . OSA on CPAP     cpap- doe snot know settings   . Arthritis   . Sinus infection     dx 12/13   . Heart murmur     noted on visit of 09/20/12- [facility]   . Blister of foot or toe     left ssecond toe healing per note of 09/21/12    Past Surgical History  Procedure Laterality Date  . Cholecystectomy    . Laparotomy  12/23/2011    Procedure: EXPLORATORY LAPAROTOMY;  Surgeon: Adolph Pollack, MD;  [facility];  Service: General;   Laterality: N/A;  . Appendectomy  12/23/2011    Procedure: APPENDECTOMY;  Surgeon: Adolph Pollack, MD;  [facility];  Service: General;  Laterality: N/A;  . Colostomy revision  12/23/2011    Procedure: COLON RESECTION SIGMOID;  Surgeon: Adolph Pollack, MD;  [facility];  Service: General;  Laterality: N/AGertie Gowda procedure    . Cardiac catheterization      [facility]  . Bilateral hand surgery       metal placed in hands due to trauma in 2011   . Right shoulder surgery       related to trauma   . Colostomy takedown  09/27/2012    Procedure: LAPAROSCOPIC COLOSTOMY TAKEDOWN;  Surgeon: Adolph Pollack, MD;  [facility];  Service: General;  Laterality: N/A;  Laparoscopic Colostomy Takedown  . Incise and drain abcess      sebaceous cyst lanced in office on lt shoulder   History  Substance Use Topics  . Smoking status: Former Smoker -- 2.00 packs/day for 40 years    Types: Cigarettes    Quit date: 10/10/1993  . Smokeless tobacco: Former Neurosurgeon  . Alcohol Use: No   Family History  Problem Relation Age of Onset  . Diabetes Mother   . Cancer Mother     skin  .  Heart disease Mother   . Heart attack Mother   . Cancer Father     Leukemia  . Colon cancer Neg Hx    Allergies  Allergen Reactions  . Sudafed [Pseudoephedrine Hcl]     "makes me feel drunk"   Current Outpatient Prescriptions on File Prior to Visit  Medication Sig Dispense Refill  . acetaminophen (TYLENOL) 500 MG tablet Take 1,000 mg by mouth every 6 (six) hours as needed. For pain.      Marland Kitchen aspirin EC 81 MG tablet Take 81 mg by mouth daily.      Marland Kitchen gabapentin (NEURONTIN) 300 MG capsule TAKE 1 CAPSULE BY MOUTH 3 TIMES DAILY.  90 capsule  3  . guaiFENesin (MUCINEX) 600 MG 12 hr tablet Take 600 mg by mouth 2 (two) times daily.      . metFORMIN (GLUCOPHAGE-XR) 500 MG 24 hr tablet TAKE 1 TABLET (500 MG TOTAL) BY MOUTH DAILY.  30 tablet  5  . naproxen sodium (ANAPROX) 220 MG tablet Take 220 mg by mouth 2 (two)  times daily as needed. For pain      . Tamsulosin HCl (FLOMAX) 0.4 MG CAPS Take 0.8 mg by mouth at bedtime.       . traMADol (ULTRAM) 50 MG tablet TAKE 1 TABLET BY MOUTH EVERY 6 HOURS AS NEEDED FOR PAIN  180 tablet  3  . [DISCONTINUED] hydrochlorothiazide (MICROZIDE) 12.5 MG capsule Take 1 capsule (12.5 mg total) by mouth daily.  30 capsule  0   No current facility-administered medications on file prior to visit.   The PMH, PSH, Social History, Family History, Medications, and allergies have been reviewed in [facility], and have been updated if relevant.    Review of Systems No SOB, No CP   Has been more fatigued.  Objective:   Physical Exam BP 136/82  Pulse 80  Temp(Src) 98 F (36.7 C) (Oral)  Wt 257 lb 8 oz (116.801 kg)  BMI 34.92 kg/m2  SpO2 96% General:  overweght male in NAD Eyes:  PERRL Ears:  External ear exam shows no significant lesions or deformities.  Otoscopic examination reveals clear canals, tympanic membranes are intact bilaterally without bulging, retraction, inflammation or discharge. Hearing is grossly normal bilaterally. Nose:  External nasal examination shows no deformity or inflammation. Nasal mucosa are pink and moist without lesions or exudates. Mouth:  Oral mucosa and oropharynx without lesions or exudates.  Teeth in good repair. Neck:  no carotid bruit or thyromegaly no cervical or supraclavicular lymphadenopathy  Lungs:  Normal respiratory effort, chest expands symmetrically. Lungs are clear to auscultation, no crackles or wheezes. Heart:  Normal rate and regular rhythm. S1 and S2 normal without gallop, murmur, click, rub or other extra sounds. Extremities:  no edema      Assessment & Plan:  1. SLEEP APNEA, OBSTRUCTIVE Deteriorated because machine and mask need to be updated. Refer to pulmonary here. Will get records from [facility]. - Ambulatory referral to Pulmonology  2.  DM- Deteriorated.  Due for a1c.  Will check today. Increase Metformin to 500 mg  twice daily. The patient indicates understanding of these issues and agrees with the plan.

## 2013-05-25 LAB — BASIC METABOLIC PANEL
BUN: 16 mg/dL (ref 6–23)
Chloride: 99 mEq/L (ref 96–112)
Creat: 1.04 mg/dL (ref 0.50–1.35)
Glucose, Bld: 158 mg/dL — ABNORMAL HIGH (ref 70–99)
Potassium: 3.9 mEq/L (ref 3.5–5.3)

## 2013-05-25 LAB — HEMOGLOBIN A1C: Hgb A1c MFr Bld: 6.9 % — ABNORMAL HIGH (ref <5.7)

## 2013-06-18 ENCOUNTER — Other Ambulatory Visit: Payer: Self-pay | Admitting: Family Medicine

## 2013-06-18 NOTE — Telephone Encounter (Signed)
Refill called to [facility]. 

## 2013-06-26 ENCOUNTER — Encounter: Payer: Self-pay | Admitting: Pulmonary Disease

## 2013-06-26 ENCOUNTER — Ambulatory Visit (INDEPENDENT_AMBULATORY_CARE_PROVIDER_SITE_OTHER): Payer: Medicare Other | Admitting: Pulmonary Disease

## 2013-06-26 VITALS — BP 112/80 | HR 73 | Temp 97.1°F | Ht 72.0 in | Wt 251.4 lb

## 2013-06-26 DIAGNOSIS — G4733 Obstructive sleep apnea (adult) (pediatric): Secondary | ICD-10-CM | POA: Diagnosis not present

## 2013-06-26 NOTE — Assessment & Plan Note (Signed)
The patient has a history of severe obstructive sleep apnea, first diagnosed in 2007.  He has been on CPAP since that time, and has done fairly well with the device.  Most recently, he has been having nonrestorative sleep and also inappropriate daytime sleepiness.  He admits to having a very old mass that is leaking, and I suspect that is the issue.  He is going to need a referral to a new home care company, and given the age of his machine, I think he needs to have a new device.  I am also encouraged him to work aggressively on weight loss.

## 2013-06-26 NOTE — Patient Instructions (Addendum)
Will see if you qualify for a new cpap machine Will get you established with a new home care company, and get you new mask and supplies. Work on weight loss If doing well, followup with me in one year.

## 2013-06-26 NOTE — Progress Notes (Signed)
Subjective:    Patient ID: Nicholas [name], male    DOB: November 11, 1940, 72 y.o.   MRN: [id number]  HPI The patient is a 73 year old male who I've been asked to see for management of obstructive sleep apnea.  He was first diagnosed with sleep apnea in 2007, are sleep study showed an AHI of 40 events per hour.  He was started on CPAP, and did well with the device, and in 2009 had a titration study showing an optimal pressure of 14-15 cm of water.  The patient states that he wears CPAP compliantly, but has an old mass that is leaking significantly.  His wife has noticed occasional breakthrough snoring.  The patient feels that he is not as rested as he has been in the past, and does note some inappropriate daytime sleepiness with inactivity.  He states that his weight is stable from 2009, and his Epworth score today is 11.   Sleep Questionnaire What time do you typically go to bed?( Between what hours) 9-10p 9-10p at 1104 on 06/26/13 by Nicholas [name], CMA How long does it take you to fall asleep?  at 1104 on 06/26/13 by Nicholas [name], CMA How many times during the night do you wake up? No Value 6-8x at 1104 on 06/26/13 by Nicholas [name], CMA What time do you get out of bed to start your day? 0600 0600 at 1104 on 06/26/13 by Nicholas [name], CMA Do you drive or operate heavy machinery in your occupation? No No at 1104 on 06/26/13 by Nicholas [name], CMA How much has your weight changed (up or down) over the past two years? (In pounds) 20 lb (9.072 kg)20 lb (9.072 kg) increase at 1104 on 06/26/13 by Nicholas [name], CMA Have you ever had a sleep study before? Yes Yes at 1104 on 06/26/13 by Nicholas [name], CMA If yes, [place] 1104 on 06/26/13 by Nicholas [name], CMA If yes, date of study? 2007/2009 2007/2009 at 1104 on 06/26/13 by Nicholas [name], CMA Do you currently use CPAP? Yes Yes at 1104 on 06/26/13 by Nicholas [name], CMA If so, what  pressure? 15--ramp 15--ramp at 1104 on 06/26/13 by Nicholas [name], CMA Do you wear oxygen at any time? No    Review of Systems  Constitutional: Negative for fever and unexpected weight change.  HENT: Negative for ear pain, nosebleeds, congestion, sore throat, rhinorrhea, sneezing, trouble swallowing, dental problem, postnasal drip and sinus pressure.   Eyes: Negative for redness and itching.  Respiratory: Positive for shortness of breath. Negative for cough, chest tightness and wheezing.   Cardiovascular: Negative for palpitations and leg swelling.  Gastrointestinal: Negative for nausea and vomiting.       Indigestion  Genitourinary: Negative for dysuria.  Musculoskeletal: Negative for joint swelling.  Skin: Negative for rash.  Neurological: Negative for headaches.  Hematological: Does not bruise/bleed easily.  Psychiatric/Behavioral: Negative for dysphoric mood. The patient is not nervous/anxious.        Objective:   Physical Exam Constitutional:  Overweight male, no acute distress  HENT:  Nares patent without discharge, but narrowed bilat.  Oropharynx without exudate, palate and uvula are thick and elongated  Eyes:  Perrla, eomi, no scleral icterus  Neck:  No JVD, no TMG  Cardiovascular:  Normal rate, regular rhythm, no rubs or gallops.  No murmurs        Intact distal pulses  Pulmonary :  Normal  breath sounds, no stridor or respiratory distress   No rales, rhonchi, or wheezing  Abdominal:  Soft, nondistended, bowel sounds present.  No tenderness noted.   Musculoskeletal:  minimal lower extremity edema noted.  Lymph Nodes:  No cervical lymphadenopathy noted  Skin:  No cyanosis noted  Neurologic:  Alert, appropriate, moves all 4 extremities without obvious deficit.         Assessment & Plan:

## 2013-06-28 ENCOUNTER — Telehealth: Payer: Self-pay | Admitting: Pulmonary Disease

## 2013-07-02 NOTE — Telephone Encounter (Signed)
Will sign and forward to [facility] as FYI

## 2013-08-02 ENCOUNTER — Other Ambulatory Visit: Payer: Self-pay | Admitting: Family Medicine

## 2013-08-02 NOTE — Telephone Encounter (Signed)
Last office visit 05/24/2013.  Ok to refill?

## 2013-08-05 NOTE — Telephone Encounter (Signed)
Ok to refill 

## 2013-08-05 NOTE — Telephone Encounter (Signed)
Called to [facility]. 

## 2013-08-07 ENCOUNTER — Ambulatory Visit: Payer: Medicare Other | Admitting: Family Medicine

## 2013-09-23 ENCOUNTER — Other Ambulatory Visit: Payer: Self-pay | Admitting: Family Medicine

## 2013-09-23 NOTE — Telephone Encounter (Signed)
Ok to phone in tramadol 

## 2013-09-23 NOTE — Telephone Encounter (Signed)
Rx called in to pharmacy. 

## 2013-10-18 ENCOUNTER — Encounter: Payer: Self-pay | Admitting: Family Medicine

## 2013-10-18 ENCOUNTER — Ambulatory Visit (INDEPENDENT_AMBULATORY_CARE_PROVIDER_SITE_OTHER): Payer: Medicare Other | Admitting: Family Medicine

## 2013-10-18 VITALS — BP 128/66 | HR 77 | Temp 97.9°F | Ht 71.0 in | Wt 257.0 lb

## 2013-10-18 DIAGNOSIS — J019 Acute sinusitis, unspecified: Secondary | ICD-10-CM | POA: Diagnosis not present

## 2013-10-18 DIAGNOSIS — G4700 Insomnia, unspecified: Secondary | ICD-10-CM | POA: Insufficient documentation

## 2013-10-18 MED ORDER — AZITHROMYCIN 250 MG PO TABS: ORAL_TABLET | ORAL | Status: DC

## 2013-10-18 MED ORDER — TRAZODONE HCL 50 MG PO TABS: 25.0000 mg | ORAL_TABLET | Freq: Every evening | ORAL | Status: DC | PRN

## 2013-10-18 NOTE — Assessment & Plan Note (Signed)
Likely multifactorial.   Discussed reasons why taking narcotics for insomnia is not appropriate. Will try low dose Trazadone. The patient indicates understanding of these issues and agrees with the plan.

## 2013-10-18 NOTE — Assessment & Plan Note (Signed)
Given duration and progression of symptoms, will treat for bacterial sinusitis with ZPack.

## 2013-10-18 NOTE — Progress Notes (Signed)
Pre-visit discussion using our clinic review tool. No additional management support is needed unless otherwise documented below in the visit note.  

## 2013-10-18 NOTE — Patient Instructions (Signed)
Take zpack as directed. Start Trazadone for sleep. Let me know how that works. Have a good weekend.

## 2013-10-18 NOTE — Progress Notes (Signed)
Subjective:     Nicholas [name] is a 73 y.o. male who presents for evaluation of sinus pain. Symptoms include: congestion, facial pain, fevers, mouth breathing and nasal congestion. Onset of symptoms was 4 weeks ago. Symptoms have been gradually worsening since that time. Past history is significant for no history of pneumonia or bronchitis. Patient is a non-smoker.  Also still having problems falling and staying asleep.  Has new mask for CPAP.  Awaiting new CPAP machine as well. Cannot tolerate benadryl.  Wants "narcotic" to help him sleep.  Patient Active Problem List   Diagnosis Date Noted  . Acute sinusitis with symptoms greater than 10 days 10/18/2013  . Insomnia 10/18/2013  . Incisional hernia, without obstruction or gangrene 03/11/2013  . Descending colostomy s/p reversal 09/27/12 09/28/2012  . Murmur, cardiac 09/20/2012  . Vascular abnormality 09/13/2012  . Colostomy care 04/05/2012  . Diverticulitis of colon with perforation 12/24/2011  . Diabetic neuropathy 08/25/2011  . PERIPHERAL NEUROPATHY 12/02/2010  . HALLUX RIGIDUS, ACQUIRED 10/14/2010  . OTHER CONGENITAL VALGUS DEFORMITY OF FEET 10/14/2010  . PES PLANUS 10/13/2010  . HYPERLIPIDEMIA 07/01/2010  . CHRONIC PAIN DUE TO TRAUMA 02/26/2010  . SLEEP APNEA, OBSTRUCTIVE 08/28/2009  . DIABETES MELLITUS, BORDERLINE 08/28/2009  . BENIGN PROSTATIC HYPERTROPHY, MILD, HX OF 08/28/2009   Past Medical History  Diagnosis Date  . Diabetes mellitus     boarderline  . BPH (benign prostatic hypertrophy)   . Neuropathy   . OSA on CPAP     cpap- doe snot know settings   . Arthritis   . Sinus infection     dx 12/13   . Heart murmur     noted on visit of 09/20/12- [facility]   . Blister of foot or toe     left ssecond toe healing per note of 09/21/12    Past Surgical History  Procedure Laterality Date  . Cholecystectomy    . Laparotomy  12/23/2011    Procedure: EXPLORATORY LAPAROTOMY;  Surgeon: Odis Hollingshead, MD;  Location: [facility];   Service: General;  Laterality: N/A;  . Appendectomy  12/23/2011    Procedure: APPENDECTOMY;  Surgeon: Odis Hollingshead, MD;  Location: [facility];  Service: General;  Laterality: N/A;  . Colostomy revision  12/23/2011    Procedure: COLON RESECTION SIGMOID;  Surgeon: Odis Hollingshead, MD;  Location: [facility];  Service: General;  Laterality: N/AJeanette Caprice procedure    . Cardiac catheterization      [facility]  . Bilateral hand surgery       metal placed in hands due to trauma in 2011   . Right shoulder surgery       related to trauma   . Colostomy takedown  09/27/2012    Procedure: LAPAROSCOPIC COLOSTOMY TAKEDOWN;  Surgeon: Odis Hollingshead, MD;  Location: [facility];  Service: General;  Laterality: N/A;  Laparoscopic Colostomy Takedown  . Incise and drain abcess      sebaceous cyst lanced in office on lt shoulder   History  Substance Use Topics  . Smoking status: Former Smoker -- 2.00 packs/day for 40 years    Types: Cigarettes    Quit date: 10/10/1993  . Smokeless tobacco: Former Systems developer  . Alcohol Use: No   Family History  Problem Relation Age of Onset  . Diabetes Mother   . Cancer Mother     skin  . Heart disease Mother   . Heart attack Mother   . Cancer Father  Leukemia  . Colon cancer Neg Hx    Allergies  Allergen Reactions  . Sudafed [Pseudoephedrine Hcl]     "makes me feel drunk"   Current Outpatient Prescriptions on File Prior to Visit  Medication Sig Dispense Refill  . acetaminophen (TYLENOL) 500 MG tablet Take 1,000 mg by mouth every 6 (six) hours as needed. For pain.      Marland Kitchen aspirin EC 81 MG tablet Take 81 mg by mouth daily.      Marland Kitchen gabapentin (NEURONTIN) 300 MG capsule TAKE 1 CAPSULE BY MOUTH 3 TIMES DAILY.  90 capsule  3  . guaiFENesin (MUCINEX) 600 MG 12 hr tablet Take 600 mg by mouth 2 (two) times daily.      . metFORMIN (GLUCOPHAGE) 500 MG tablet Take 1 tablet (500 mg total) by mouth 2 (two) times daily with a meal.  180 tablet  3  . naproxen sodium (ANAPROX) 220 MG  tablet Take 220 mg by mouth 2 (two) times daily as needed. For pain      . Tamsulosin HCl (FLOMAX) 0.4 MG CAPS Take 0.8 mg by mouth at bedtime.       . traMADol (ULTRAM) 50 MG tablet TAKE 1 TABLET BY MOUTH EVERY 6 HOURS AS NEEDED  180 tablet  0  . [DISCONTINUED] hydrochlorothiazide (MICROZIDE) 12.5 MG capsule Take 1 capsule (12.5 mg total) by mouth daily.  30 capsule  0   No current facility-administered medications on file prior to visit.     The PMH, PSH, Social History, Family History, Medications, and allergies have been reviewed in [facility], and have been updated if relevant.   Review of Systems See HPI  Objective:  BP 128/66  Pulse 77  Temp(Src) 97.9 F (36.6 C) (Oral)  Ht 5\' 11"  (1.803 m)  Wt 257 lb (116.574 kg)  BMI 35.86 kg/m2  SpO2 96% Gen:  Alert, pleasant, NAD HEENT:  Boggy turbinates, no obstruction.Sinuses TTP throughout. Resp:  CTA bilaterally CVS:  RRR Ext:  No edema

## 2013-11-06 ENCOUNTER — Telehealth: Payer: Self-pay | Admitting: Family Medicine

## 2013-11-06 NOTE — Telephone Encounter (Signed)
Spoke to pt and advised per Dr Deborra Medina. F/u appt sched for 11/07/13@ 1045

## 2013-11-06 NOTE — Telephone Encounter (Signed)
Pt's wife called and left msg saying that pt had OV weeks ago for a sinus infection.  He has been on abx since then but his symptoms have not cleared.  She wants to know if he can have a refill on abx or if pt needs another appt.

## 2013-11-06 NOTE — Telephone Encounter (Signed)
Yes he unfortunately needs to be seen again.

## 2013-11-07 ENCOUNTER — Encounter: Payer: Self-pay | Admitting: Family Medicine

## 2013-11-07 ENCOUNTER — Ambulatory Visit (INDEPENDENT_AMBULATORY_CARE_PROVIDER_SITE_OTHER): Payer: Medicare Other | Admitting: Family Medicine

## 2013-11-07 VITALS — BP 152/66 | HR 73 | Temp 97.5°F | Ht 71.0 in | Wt 255.5 lb

## 2013-11-07 DIAGNOSIS — J019 Acute sinusitis, unspecified: Secondary | ICD-10-CM

## 2013-11-07 MED ORDER — AMOXICILLIN 875 MG PO TABS: 875.0000 mg | ORAL_TABLET | Freq: Two times a day (BID) | ORAL | Status: DC

## 2013-11-07 MED ORDER — TRAMADOL HCL 50 MG PO TABS: ORAL_TABLET | ORAL | Status: DC

## 2013-11-07 MED ORDER — FLUTICASONE PROPIONATE 50 MCG/ACT NA SUSP: 2.0000 | Freq: Every day | NASAL | Status: DC

## 2013-11-07 NOTE — Assessment & Plan Note (Signed)
Symptoms deteriorated again. Will treat with 10 day course of amoxcillin. Flonase. Call or return to clinic prn if these symptoms worsen or fail to improve as anticipated. The patient indicates understanding of these issues and agrees with the plan.

## 2013-11-07 NOTE — Progress Notes (Signed)
Pre-visit discussion using our clinic review tool. No additional management support is needed unless otherwise documented below in the visit note.  

## 2013-11-07 NOTE — Patient Instructions (Signed)
Take Amoxicillin- 1 tablet twice daily x 10 days. Flonase as directed.    Let me know how that works. Have a good weekend.

## 2013-11-07 NOTE — Progress Notes (Signed)
Subjective:     Nicholas [name] is a 73 y.o. male who presents for follow up sinusitis.  Saw him on 1/9- given zpack.  Symptoms improved slightly but have since deteriorated.   Symptoms include: congestion, facial pain, fevers, mouth breathing and nasal congestion.   . Symptoms have been gradually worsening since that time. Past history is significant for no history of pneumonia or bronchitis. Patient is a non-smoker.  Just started wearing new CPAP mask last week.  Patient Active Problem List   Diagnosis Date Noted  . Acute sinusitis with symptoms greater than 10 days 10/18/2013  . Insomnia 10/18/2013  . Incisional hernia, without obstruction or gangrene 03/11/2013  . Descending colostomy s/p reversal 09/27/12 09/28/2012  . Murmur, cardiac 09/20/2012  . Vascular abnormality 09/13/2012  . Colostomy care 04/05/2012  . Diverticulitis of colon with perforation 12/24/2011  . Diabetic neuropathy 08/25/2011  . PERIPHERAL NEUROPATHY 12/02/2010  . HALLUX RIGIDUS, ACQUIRED 10/14/2010  . OTHER CONGENITAL VALGUS DEFORMITY OF FEET 10/14/2010  . PES PLANUS 10/13/2010  . HYPERLIPIDEMIA 07/01/2010  . CHRONIC PAIN DUE TO TRAUMA 02/26/2010  . SLEEP APNEA, OBSTRUCTIVE 08/28/2009  . DIABETES MELLITUS, BORDERLINE 08/28/2009  . BENIGN PROSTATIC HYPERTROPHY, MILD, HX OF 08/28/2009   Past Medical History  Diagnosis Date  . Diabetes mellitus     boarderline  . BPH (benign prostatic hypertrophy)   . Neuropathy   . OSA on CPAP     cpap- doe snot know settings   . Arthritis   . Sinus infection     dx 12/13   . Heart murmur     noted on visit of 09/20/12- [facility]   . Blister of foot or toe     left ssecond toe healing per note of 09/21/12    Past Surgical History  Procedure Laterality Date  . Cholecystectomy    . Laparotomy  12/23/2011    Procedure: EXPLORATORY LAPAROTOMY;  Surgeon: Odis Hollingshead, MD;  Location: [facility];  Service: General;  Laterality: N/A;  . Appendectomy  12/23/2011   Procedure: APPENDECTOMY;  Surgeon: Odis Hollingshead, MD;  Location: [facility];  Service: General;  Laterality: N/A;  . Colostomy revision  12/23/2011    Procedure: COLON RESECTION SIGMOID;  Surgeon: Odis Hollingshead, MD;  Location: [facility];  Service: General;  Laterality: N/AJeanette Caprice procedure    . Cardiac catheterization      [facility]  . Bilateral hand surgery       metal placed in hands due to trauma in 2011   . Right shoulder surgery       related to trauma   . Colostomy takedown  09/27/2012    Procedure: LAPAROSCOPIC COLOSTOMY TAKEDOWN;  Surgeon: Odis Hollingshead, MD;  Location: [facility];  Service: General;  Laterality: N/A;  Laparoscopic Colostomy Takedown  . Incise and drain abcess      sebaceous cyst lanced in office on lt shoulder   History  Substance Use Topics  . Smoking status: Former Smoker -- 2.00 packs/day for 40 years    Types: Cigarettes    Quit date: 10/10/1993  . Smokeless tobacco: Former Systems developer  . Alcohol Use: No   Family History  Problem Relation Age of Onset  . Diabetes Mother   . Cancer Mother     skin  . Heart disease Mother   . Heart attack Mother   . Cancer Father     Leukemia  . Colon cancer Neg Hx    Allergies  Allergen  Reactions  . Sudafed [Pseudoephedrine Hcl]     "makes me feel drunk"   Current Outpatient Prescriptions on File Prior to Visit  Medication Sig Dispense Refill  . acetaminophen (TYLENOL) 500 MG tablet Take 1,000 mg by mouth every 6 (six) hours as needed. For pain.      Marland Kitchen aspirin EC 81 MG tablet Take 81 mg by mouth daily.      Marland Kitchen gabapentin (NEURONTIN) 300 MG capsule TAKE 1 CAPSULE BY MOUTH 3 TIMES DAILY.  90 capsule  3  . guaiFENesin (MUCINEX) 600 MG 12 hr tablet Take 600 mg by mouth 2 (two) times daily.      . metFORMIN (GLUCOPHAGE) 500 MG tablet Take 1 tablet (500 mg total) by mouth 2 (two) times daily with a meal.  180 tablet  3  . naproxen sodium (ANAPROX) 220 MG tablet Take 220 mg by mouth 2 (two) times daily as needed. For pain       . Tamsulosin HCl (FLOMAX) 0.4 MG CAPS Take 0.8 mg by mouth at bedtime.       . traMADol (ULTRAM) 50 MG tablet TAKE 1 TABLET BY MOUTH EVERY 6 HOURS AS NEEDED  180 tablet  0  . traZODone (DESYREL) 50 MG tablet Take 0.5-1 tablets (25-50 mg total) by mouth at bedtime as needed for sleep.  30 tablet  3  . [DISCONTINUED] hydrochlorothiazide (MICROZIDE) 12.5 MG capsule Take 1 capsule (12.5 mg total) by mouth daily.  30 capsule  0   No current facility-administered medications on file prior to visit.     The PMH, PSH, Social History, Family History, Medications, and allergies have been reviewed in [facility], and have been updated if relevant.   Review of Systems See HPI  Objective:  BP 152/66  Pulse 73  Temp(Src) 97.5 F (36.4 C) (Oral)  Ht 5\' 11"  (1.803 m)  Wt 255 lb 8 oz (115.894 kg)  BMI 35.65 kg/m2  SpO2 97% Gen:  Alert, pleasant, NAD HEENT:  Boggy turbinates, no obstruction.Sinuses TTP throughout. Resp:  CTA bilaterally CVS:  RRR Ext:  No edema

## 2013-12-13 ENCOUNTER — Encounter (INDEPENDENT_AMBULATORY_CARE_PROVIDER_SITE_OTHER): Payer: Self-pay | Admitting: General Surgery

## 2013-12-13 ENCOUNTER — Ambulatory Visit (INDEPENDENT_AMBULATORY_CARE_PROVIDER_SITE_OTHER): Payer: Medicare Other | Admitting: General Surgery

## 2013-12-13 VITALS — BP 140/80 | HR 76 | Temp 98.5°F | Resp 14 | Ht 72.0 in | Wt 246.2 lb

## 2013-12-13 DIAGNOSIS — K432 Incisional hernia without obstruction or gangrene: Secondary | ICD-10-CM | POA: Diagnosis not present

## 2013-12-13 NOTE — Patient Instructions (Signed)
Where the abdominal binder for comfort. Call us back if you would like to have the surgery.

## 2013-12-13 NOTE — Progress Notes (Signed)
Subjective:     Patient ID: Nicholas [name], male   DOB: [birth year], 73 y.o.   MRN: [id number]  HPIHe returns because he is noted the colostomy site incisional hernia becoming larger and he has some questions. Specifically he wonders if there is anything that can be done regarding the hernia other than surgery. He is also wondering what the surgery would involve.   Review of Systems No constipation. Occasional nausea.     Objective:   Physical Exam Gen.-overweight male in no acute distress.  Abdomen-right subcostal incision is present. Midline incision is present. There is a periumbilical bulge. There is a left sided transverse abdominal incision with a reducible bulge in palpable fascial defect.    Assessment:     Ventral incisional hernias. Colostomy site hernia is slightly larger. No obstruction symptoms.     Plan:     We discussed the options of laparoscopic possible open ventral incisional hernia with mesh urges continued expectant management and wearing a binder for comfort.  I have discussed the procedure and risks of repair. Risks include but are not limited to bleeding, infection, wound healing problems, anesthesia, recurrence, accidental injury to intra-abdominal organs-such as intestine, liver, spleen, bladder, etc.  He is not sure that he wants to proceed with surgery at this time. He wants to try the binder. I told him to call his back if he changed his mind about having surgery.

## 2013-12-29 ENCOUNTER — Other Ambulatory Visit: Payer: Self-pay | Admitting: Family Medicine

## 2013-12-30 NOTE — Telephone Encounter (Signed)
Pt requesting medication refill. Last ov 10/2013 with no future appt scheduled. pls advise

## 2014-01-01 ENCOUNTER — Other Ambulatory Visit: Payer: Self-pay | Admitting: Family Medicine

## 2014-01-02 NOTE — Telephone Encounter (Signed)
Spoke to pt and informed him Rx has been called in to requested pharmacy 

## 2014-01-20 ENCOUNTER — Other Ambulatory Visit: Payer: Self-pay | Admitting: Family Medicine

## 2014-01-31 DIAGNOSIS — E119 Type 2 diabetes mellitus without complications: Secondary | ICD-10-CM | POA: Diagnosis not present

## 2014-02-07 ENCOUNTER — Encounter: Payer: Self-pay | Admitting: Family Medicine

## 2014-02-10 ENCOUNTER — Encounter: Payer: Self-pay | Admitting: Internal Medicine

## 2014-02-10 ENCOUNTER — Ambulatory Visit (INDEPENDENT_AMBULATORY_CARE_PROVIDER_SITE_OTHER): Payer: Medicare Other | Admitting: Internal Medicine

## 2014-02-10 VITALS — BP 130/70 | HR 70 | Temp 98.0°F | Resp 18 | Wt 250.0 lb

## 2014-02-10 DIAGNOSIS — J019 Acute sinusitis, unspecified: Secondary | ICD-10-CM

## 2014-02-10 MED ORDER — AMOXICILLIN 500 MG PO TABS: 1000.0000 mg | ORAL_TABLET | Freq: Two times a day (BID) | ORAL | Status: DC

## 2014-02-10 NOTE — Assessment & Plan Note (Signed)
Sounds like bacterial infection again Will treat with amoxil If worsens, broaden coverage to augmentin

## 2014-02-10 NOTE — Progress Notes (Signed)
Subjective:    Patient ID: Nicholas [name], male    DOB: Jan 26, 1941, 73 y.o.   MRN: [id number]  HPI Did et over sinus infection in January  Now with symptoms for 1.5-2 weeks Has nasal congestion At first clear rhinorrhea Gets green sputum with cough Ears are full with pressure Does have PND but also heavy and sore in his chest  Gets DOE with any walking--slightly worse than his usual No fever No night chills---mild sweats which are not new  Has not taken any meds other than saline nasal spray  Current Outpatient Prescriptions on File Prior to Visit  Medication Sig Dispense Refill  . fluticasone (FLONASE) 50 MCG/ACT nasal spray Place 2 sprays into both nostrils daily.  16 g  6  . gabapentin (NEURONTIN) 300 MG capsule TAKE 1 CAPSULE BY MOUTH 3 TIMES DAILY.  90 capsule  3  . metFORMIN (GLUCOPHAGE) 500 MG tablet Take 1 tablet (500 mg total) by mouth 2 (two) times daily with a meal.  180 tablet  3  . naproxen sodium (ANAPROX) 220 MG tablet Take 220 mg by mouth 2 (two) times daily as needed. For pain      . Tamsulosin HCl (FLOMAX) 0.4 MG CAPS Take 0.8 mg by mouth at bedtime.       . traMADol (ULTRAM) 50 MG tablet TAKE 1 TABLET BY MOUTH EVERY 6 HOURS AS NEEDED  180 tablet  0  . [DISCONTINUED] hydrochlorothiazide (MICROZIDE) 12.5 MG capsule Take 1 capsule (12.5 mg total) by mouth daily.  30 capsule  0   No current facility-administered medications on file prior to visit.    Allergies  Allergen Reactions  . Sudafed [Pseudoephedrine Hcl]     "makes me feel drunk"    Past Medical History  Diagnosis Date  . Diabetes mellitus     boarderline  . BPH (benign prostatic hypertrophy)   . Neuropathy   . OSA on CPAP     cpap- doe snot know settings   . Arthritis   . Sinus infection     dx 12/13   . Heart murmur     noted on visit of 09/20/12- [facility]   . Blister of foot or toe     left ssecond toe healing per note of 09/21/12     Past Surgical History  Procedure Laterality Date  .  Cholecystectomy    . Laparotomy  12/23/2011    Procedure: EXPLORATORY LAPAROTOMY;  Surgeon: Odis Hollingshead, MD;  Location: [facility];  Service: General;  Laterality: N/A;  . Appendectomy  12/23/2011    Procedure: APPENDECTOMY;  Surgeon: Odis Hollingshead, MD;  Location: [facility];  Service: General;  Laterality: N/A;  . Colostomy revision  12/23/2011    Procedure: COLON RESECTION SIGMOID;  Surgeon: Odis Hollingshead, MD;  Location: [facility];  Service: General;  Laterality: N/AJeanette Caprice procedure    . Cardiac catheterization      [facility]  . Bilateral hand surgery       metal placed in hands due to trauma in 2011   . Right shoulder surgery       related to trauma   . Colostomy takedown  09/27/2012    Procedure: LAPAROSCOPIC COLOSTOMY TAKEDOWN;  Surgeon: Odis Hollingshead, MD;  Location: [facility];  Service: General;  Laterality: N/A;  Laparoscopic Colostomy Takedown  . Incise and drain abcess      sebaceous cyst lanced in office on lt shoulder    Family History  Problem Relation Age of Onset  . Diabetes Mother   . Cancer Mother     skin  . Heart disease Mother   . Heart attack Mother   . Cancer Father     Leukemia  . Colon cancer Neg Hx     History   Social History  . Marital Status: Married    Spouse Name: N/A    Number of Children: N/A  . Years of Education: N/A   Occupational History  . Futures trader    Social History Main Topics  . Smoking status: Former Smoker -- 2.00 packs/day for 40 years    Types: Cigarettes    Quit date: 10/10/1993  . Smokeless tobacco: Former Systems developer  . Alcohol Use: No  . Drug Use: No  . Sexual Activity: Not on file   Other Topics Concern  . Not on file   Social History Narrative  . No narrative on file   Review of Systems Sleeps with CPAP No regular spring allergies No rash No vomiting or diarrhea Appetite is fine    Objective:   Physical Exam  Constitutional: He appears well-developed and well-nourished. No distress.  HENT:    Mouth/Throat: Oropharynx is clear and moist. No oropharyngeal exudate.  Mild frontal and maxillary tenderness Mild nasal congestion TMs normal  Neck: Normal range of motion. Neck supple.  Pulmonary/Chest: Effort normal. No respiratory distress. He has no wheezes.  Very slight right basilar crackles  Lymphadenopathy:    He has no cervical adenopathy.  Skin: No rash noted.          Assessment & Plan:

## 2014-02-10 NOTE — Patient Instructions (Signed)

## 2014-02-10 NOTE — Progress Notes (Signed)
Pre visit review using our clinic review tool, if applicable. No additional management support is needed unless otherwise documented below in the visit note. 

## 2014-02-20 ENCOUNTER — Other Ambulatory Visit: Payer: Self-pay | Admitting: Family Medicine

## 2014-02-21 NOTE — Telephone Encounter (Signed)
Spoke to pt and informed him Rx has been called in to requested pharmacy 

## 2014-02-21 NOTE — Telephone Encounter (Signed)
Pt requesting medication refill. Last f/u ov 10/2013 with no future appts scheduled. pls advise

## 2014-03-18 ENCOUNTER — Telehealth: Payer: Self-pay

## 2014-03-18 ENCOUNTER — Encounter: Payer: Self-pay | Admitting: Family Medicine

## 2014-03-18 ENCOUNTER — Ambulatory Visit (INDEPENDENT_AMBULATORY_CARE_PROVIDER_SITE_OTHER): Payer: Medicare Other | Admitting: Family Medicine

## 2014-03-18 VITALS — BP 132/66 | HR 67 | Temp 97.5°F | Wt 251.2 lb

## 2014-03-18 DIAGNOSIS — E1142 Type 2 diabetes mellitus with diabetic polyneuropathy: Secondary | ICD-10-CM

## 2014-03-18 DIAGNOSIS — E1140 Type 2 diabetes mellitus with diabetic neuropathy, unspecified: Secondary | ICD-10-CM

## 2014-03-18 DIAGNOSIS — Z136 Encounter for screening for cardiovascular disorders: Secondary | ICD-10-CM

## 2014-03-18 DIAGNOSIS — E1149 Type 2 diabetes mellitus with other diabetic neurological complication: Secondary | ICD-10-CM | POA: Diagnosis not present

## 2014-03-18 LAB — COMPREHENSIVE METABOLIC PANEL
ALK PHOS: 48 U/L (ref 39–117)
ALT: 24 U/L (ref 0–53)
AST: 21 U/L (ref 0–37)
Albumin: 3.9 g/dL (ref 3.5–5.2)
BILIRUBIN TOTAL: 0.6 mg/dL (ref 0.2–1.2)
BUN: 16 mg/dL (ref 6–23)
CO2: 26 meq/L (ref 19–32)
CREATININE: 1 mg/dL (ref 0.4–1.5)
Calcium: 9.6 mg/dL (ref 8.4–10.5)
Chloride: 103 mEq/L (ref 96–112)
GFR: 76.28 mL/min (ref >60.00)
Glucose, Bld: 112 mg/dL — ABNORMAL HIGH (ref 70–99)
Potassium: 4.3 mEq/L (ref 3.5–5.1)
Sodium: 136 mEq/L (ref 135–145)
Total Protein: 6.9 g/dL (ref 6.0–8.3)

## 2014-03-18 LAB — LIPID PANEL
Cholesterol: 175 mg/dL (ref 0–200)
HDL: 35.1 mg/dL — ABNORMAL LOW (ref >39.00)
LDL CALC: 83 mg/dL (ref 0–99)
NONHDL: 139.9
Total CHOL/HDL Ratio: 5
Triglycerides: 285 mg/dL — ABNORMAL HIGH (ref 0.0–149.0)
VLDL: 57 mg/dL — AB (ref 0.0–40.0)

## 2014-03-18 LAB — MICROALBUMIN / CREATININE URINE RATIO
Creatinine,U: 74.8 mg/dL
MICROALB/CREAT RATIO: 6.5 mg/g (ref 0.0–30.0)
Microalb, Ur: 4.9 mg/dL — ABNORMAL HIGH (ref 0.0–1.9)

## 2014-03-18 LAB — HEMOGLOBIN A1C: HEMOGLOBIN A1C: 6.8 % — AB (ref 4.6–6.5)

## 2014-03-18 MED ORDER — GABAPENTIN 400 MG PO CAPS: ORAL_CAPSULE | ORAL | Status: DC

## 2014-03-18 NOTE — Patient Instructions (Signed)
Good to see you. We are increasing your gabapentin to 400 mg four times daily, continue Tramadol.  I will call you with your lab results.

## 2014-03-18 NOTE — Progress Notes (Signed)
Pre visit review using our clinic review tool, if applicable. No additional management support is needed unless otherwise documented below in the visit note. 

## 2014-03-18 NOTE — Telephone Encounter (Signed)
Nicholas [name] with [facility] left v/m pt has been taking Gabapentin 400 mg taking one cap tid; last rx sent in was for gabapentin 400 mg taking one cap qid. Nicholas [name] request cb for clarification.

## 2014-03-18 NOTE — Progress Notes (Signed)
73 yo here to discuss his diabetic neuropathy.   He has a h/o diabetic neuropathy with minimal feeling in his toes. Lab Results  Component Value Date   HGBA1C 6.9* 05/24/2013    On gabapentin 300 mg three times daily.  Could not afford Lyrica.  Feels Gabapentin helps some but "not enough."  On Tramadol now which he feel is more effective but at times, feels his neuropathy is often unbearable.  Cannot elevated his legs without feeling "like I am standing on ice- burning/cold/sensation of pin pricks."  DM- On Metformin 500 mg twice daily Due for labs, urine micro. Lab Results  Component Value Date   HGBA1C 6.9* 05/24/2013   Lab Results  Component Value Date   CHOL 180 07/01/2010   HDL 37.10* 07/01/2010   LDLCALC 80 02/26/2010   LDLDIRECT 114.7 07/01/2010   TRIG 212.0* 07/01/2010   CHOLHDL 5 07/01/2010     Patient Active Problem List   Diagnosis Date Noted  . Type II or unspecified type diabetes mellitus with neurological manifestations, not stated as uncontrolled 03/18/2014  . Acute sinusitis with symptoms greater than 10 days 10/18/2013  . Insomnia 10/18/2013  . Incisional hernia, without obstruction or gangrene 03/11/2013  . Descending colostomy s/p reversal 09/27/12 09/28/2012  . Murmur, cardiac 09/20/2012  . Vascular abnormality 09/13/2012  . Colostomy care 04/05/2012  . Diverticulitis of colon with perforation 12/24/2011  . Diabetic neuropathy 08/25/2011  . PERIPHERAL NEUROPATHY 12/02/2010  . HALLUX RIGIDUS, ACQUIRED 10/14/2010  . OTHER CONGENITAL VALGUS DEFORMITY OF FEET 10/14/2010  . PES PLANUS 10/13/2010  . HYPERLIPIDEMIA 07/01/2010  . CHRONIC PAIN DUE TO TRAUMA 02/26/2010  . SLEEP APNEA, OBSTRUCTIVE 08/28/2009  . BENIGN PROSTATIC HYPERTROPHY, MILD, HX OF 08/28/2009   Past Medical History  Diagnosis Date  . Diabetes mellitus     boarderline  . BPH (benign prostatic hypertrophy)   . Neuropathy   . OSA on CPAP     cpap- doe snot know settings   . Arthritis    . Sinus infection     dx 12/13   . Heart murmur     noted on visit of 09/20/12- [facility]   . Blister of foot or toe     left ssecond toe healing per note of 09/21/12    Past Surgical History  Procedure Laterality Date  . Cholecystectomy    . Laparotomy  12/23/2011    Procedure: EXPLORATORY LAPAROTOMY;  Surgeon: Odis Hollingshead, MD;  Location: [facility];  Service: General;  Laterality: N/A;  . Appendectomy  12/23/2011    Procedure: APPENDECTOMY;  Surgeon: Odis Hollingshead, MD;  Location: [facility];  Service: General;  Laterality: N/A;  . Colostomy revision  12/23/2011    Procedure: COLON RESECTION SIGMOID;  Surgeon: Odis Hollingshead, MD;  Location: [facility];  Service: General;  Laterality: N/AJeanette Caprice procedure    . Cardiac catheterization      [facility]  . Bilateral hand surgery       metal placed in hands due to trauma in 2011   . Right shoulder surgery       related to trauma   . Colostomy takedown  09/27/2012    Procedure: LAPAROSCOPIC COLOSTOMY TAKEDOWN;  Surgeon: Odis Hollingshead, MD;  Location: [facility];  Service: General;  Laterality: N/A;  Laparoscopic Colostomy Takedown  . Incise and drain abcess      sebaceous cyst lanced in office on lt shoulder   History  Substance Use Topics  . Smoking  status: Former Smoker -- 2.00 packs/day for 40 years    Types: Cigarettes    Quit date: 10/10/1993  . Smokeless tobacco: Former Systems developer  . Alcohol Use: No   Family History  Problem Relation Age of Onset  . Diabetes Mother   . Cancer Mother     skin  . Heart disease Mother   . Heart attack Mother   . Cancer Father     Leukemia  . Colon cancer Neg Hx    Allergies  Allergen Reactions  . Sudafed [Pseudoephedrine Hcl]     "makes me feel drunk"   Current Outpatient Prescriptions on File Prior to Visit  Medication Sig Dispense Refill  . aspirin 81 MG tablet Take 81 mg by mouth daily.      . fluticasone (FLONASE) 50 MCG/ACT nasal spray Place 2 sprays into both nostrils daily.  16 g  6  .  metFORMIN (GLUCOPHAGE) 500 MG tablet Take 1 tablet (500 mg total) by mouth 2 (two) times daily with a meal.  180 tablet  3  . naproxen sodium (ANAPROX) 220 MG tablet Take 220 mg by mouth 2 (two) times daily as needed. For pain      . Tamsulosin HCl (FLOMAX) 0.4 MG CAPS Take 0.8 mg by mouth at bedtime.       . traMADol (ULTRAM) 50 MG tablet TAKE 1 TABLET BY MOUTH EVERY 6 HOURS AS NEEDED  180 tablet  0  . [DISCONTINUED] hydrochlorothiazide (MICROZIDE) 12.5 MG capsule Take 1 capsule (12.5 mg total) by mouth daily.  30 capsule  0   No current facility-administered medications on file prior to visit.   The PMH, PSH, Social History, Family History, Medications, and allergies have been reviewed in [facility], and have been updated if relevant.   REVIEW OF SYSTEMS  GEN: No systemic complaints, no fevers, chills, sweats, or other acute illnesses   Physical Exam  BP 132/66  Pulse 67  Temp(Src) 97.5 F (36.4 C) (Oral)  Wt 251 lb 4 oz (113.966 kg)  SpO2 96%  General: GEN: Well-developed,well-nourished,in no acute distress; alert,appropriate and cooperative throughout examination  PSYCH: Normally interactive. Cooperative during the interview. Pleasant. Friendly and conversant. Not anxious or depressed appearing. Normal, full affect.   Impression & Recommendations:    Diabetic neuropathy - Plan: Comprehensive metabolic panel  Type II or unspecified type diabetes mellitus with neurological manifestations, not stated as uncontrolled - Plan: Microalbumin / creatinine urine ratio, Hemoglobin A1c  Screening for ischemic heart disease - Plan: Lipid panel

## 2014-03-18 NOTE — Assessment & Plan Note (Signed)
Check labs and urine micro today. Orders Placed This Encounter  Procedures  . Microalbumin / creatinine urine ratio  . Hemoglobin A1c  . Lipid panel  . Comprehensive metabolic panel

## 2014-03-18 NOTE — Telephone Encounter (Signed)
Yes please increase to four times daily.

## 2014-03-18 NOTE — Telephone Encounter (Signed)
Spoke to pharmacy and advised per Dr Deborra Medina. Pharmacist states she will make necessary changes. Original Rx was correct at [facility]

## 2014-03-18 NOTE — Assessment & Plan Note (Signed)
Deteriorated. Will increase Gabapentin to 400 mg four times daily. He is aware that he should not drive after taking Gabapentin or tramadol.

## 2014-04-07 ENCOUNTER — Other Ambulatory Visit: Payer: Self-pay | Admitting: Family Medicine

## 2014-04-07 NOTE — Telephone Encounter (Signed)
Pt requesting medication refill. Last f/u appt 07/2013 with no future appts scheduled. pls advise

## 2014-04-07 NOTE — Telephone Encounter (Signed)
Spoke to pt and informed him Rx has been faxed to requested pharmacy

## 2014-05-18 ENCOUNTER — Other Ambulatory Visit: Payer: Self-pay | Admitting: Family Medicine

## 2014-06-05 ENCOUNTER — Other Ambulatory Visit: Payer: Self-pay | Admitting: Family Medicine

## 2014-06-06 NOTE — Telephone Encounter (Signed)
Please refill times one in PCP absence 

## 2014-06-06 NOTE — Telephone Encounter (Signed)
Rx called in as prescribed 

## 2014-06-06 NOTE — Telephone Encounter (Signed)
Pt requesting medication refill. Last f/u appt 03/2014 with no future appts scheduled. pls advise

## 2014-06-13 DIAGNOSIS — N139 Obstructive and reflux uropathy, unspecified: Secondary | ICD-10-CM | POA: Diagnosis not present

## 2014-06-13 DIAGNOSIS — N401 Enlarged prostate with lower urinary tract symptoms: Secondary | ICD-10-CM | POA: Diagnosis not present

## 2014-06-13 DIAGNOSIS — Z125 Encounter for screening for malignant neoplasm of prostate: Secondary | ICD-10-CM | POA: Diagnosis not present

## 2014-06-13 DIAGNOSIS — N138 Other obstructive and reflux uropathy: Secondary | ICD-10-CM | POA: Diagnosis not present

## 2014-06-17 DIAGNOSIS — N401 Enlarged prostate with lower urinary tract symptoms: Secondary | ICD-10-CM | POA: Diagnosis not present

## 2014-06-17 DIAGNOSIS — N138 Other obstructive and reflux uropathy: Secondary | ICD-10-CM | POA: Diagnosis not present

## 2014-06-26 ENCOUNTER — Ambulatory Visit: Payer: Medicare Other | Admitting: Pulmonary Disease

## 2014-07-07 ENCOUNTER — Other Ambulatory Visit: Payer: Self-pay | Admitting: *Deleted

## 2014-07-07 MED ORDER — GABAPENTIN 400 MG PO CAPS: ORAL_CAPSULE | ORAL | Status: DC

## 2014-07-08 ENCOUNTER — Ambulatory Visit (INDEPENDENT_AMBULATORY_CARE_PROVIDER_SITE_OTHER): Payer: Medicare Other | Admitting: *Deleted

## 2014-07-08 DIAGNOSIS — Z23 Encounter for immunization: Secondary | ICD-10-CM

## 2014-07-15 ENCOUNTER — Ambulatory Visit: Payer: Medicare Other | Admitting: Pulmonary Disease

## 2014-07-22 ENCOUNTER — Ambulatory Visit (INDEPENDENT_AMBULATORY_CARE_PROVIDER_SITE_OTHER): Payer: Medicare Other | Admitting: Internal Medicine

## 2014-07-22 ENCOUNTER — Encounter: Payer: Self-pay | Admitting: Internal Medicine

## 2014-07-22 VITALS — BP 134/70 | HR 90 | Temp 98.6°F | Wt 248.0 lb

## 2014-07-22 DIAGNOSIS — J209 Acute bronchitis, unspecified: Secondary | ICD-10-CM | POA: Diagnosis not present

## 2014-07-22 MED ORDER — HYDROCODONE-HOMATROPINE 5-1.5 MG/5ML PO SYRP: 5.0000 mL | ORAL_SOLUTION | Freq: Three times a day (TID) | ORAL | Status: DC | PRN

## 2014-07-22 MED ORDER — ALBUTEROL SULFATE HFA 108 (90 BASE) MCG/ACT IN AERS: 2.0000 | INHALATION_SPRAY | Freq: Four times a day (QID) | RESPIRATORY_TRACT | Status: DC | PRN

## 2014-07-22 MED ORDER — AZITHROMYCIN 250 MG PO TABS: ORAL_TABLET | ORAL | Status: DC

## 2014-07-22 NOTE — Progress Notes (Signed)
HPI  Pt presents to the clinic today with c/o cough and chest congestion. He reports this started 1 week ago. The cough is productive of green mucous. He has also noted some laryngitis and sore throat. He denies fever but has had chills. He has tried Mucinex and 7 days of Amoxicillin (left over from his wifes RX) without any relief. He has no history of seasonally allergies or breathing problems other than OSA. He was unable to wear his CPAP last night because he felt like he was choking on the mucous.  He has had sick contacts with similar symptoms.  Review of Systems      Past Medical History  Diagnosis Date  . Diabetes mellitus     boarderline  . BPH (benign prostatic hypertrophy)   . Neuropathy   . OSA on CPAP     cpap- doe snot know settings   . Arthritis   . Sinus infection     dx 12/13   . Heart murmur     noted on visit of 09/20/12- [facility]   . Blister of foot or toe     left ssecond toe healing per note of 09/21/12     Family History  Problem Relation Age of Onset  . Diabetes Mother   . Cancer Mother     skin  . Heart disease Mother   . Heart attack Mother   . Cancer Father     Leukemia  . Colon cancer Neg Hx     History   Social History  . Marital Status: Married    Spouse Name: N/A    Number of Children: N/A  . Years of Education: N/A   Occupational History  . Futures trader    Social History Main Topics  . Smoking status: Former Smoker -- 2.00 packs/day for 40 years    Types: Cigarettes    Quit date: 10/10/1993  . Smokeless tobacco: Former Systems developer  . Alcohol Use: No  . Drug Use: No  . Sexual Activity: Not on file   Other Topics Concern  . Not on file   Social History Narrative  . No narrative on file    Allergies  Allergen Reactions  . Sudafed [Pseudoephedrine Hcl]     "makes me feel drunk"     Constitutional:  Denies headache, fatigue, fever or abrupt weight changes.  HEENT:  Positive sore throat. Denies eye redness, eye pain,  pressure behind the eyes, facial pain, nasal congestion, ear pain, ringing in the ears, wax buildup, runny nose or bloody nose. Respiratory: Positive cough and shortness of breath. Denies difficulty breathing.  Cardiovascular: Denies chest pain, chest tightness, palpitations or swelling in the hands or feet.   No other specific complaints in a complete review of systems (except as listed in HPI above).  Objective:   BP 134/70  Pulse 90  Temp(Src) 98.6 F (37 C) (Oral)  Wt 248 lb (112.492 kg)  SpO2 97% Wt Readings from Last 3 Encounters:  07/22/14 248 lb (112.492 kg)  03/18/14 251 lb 4 oz (113.966 kg)  02/10/14 250 lb (113.399 kg)     General: Appears his stated age, obese but well developed, well nourished in NAD. HEENT: Head: normal shape and size; Ears: Tm's gray and intact, normal light reflex; Nose: mucosa pink and moist, septum midline; Throat/Mouth: + PND. Teeth present, mucosa pink and moist, no exudate noted, no lesions or ulcerations noted.  Cardiovascular: Normal rate and rhythm. S1,S2 noted. Murmur noted. No  rubs or  gallops noted. Pulmonary/Chest: Normal effort and scattered expiratory wheeze noted. No respiratory distress. No  rales or ronchi noted.      Assessment & Plan:   Acute Bronchitis:  Get some rest and drink plenty of water Do salt water gargles for the sore throat eRx for Azithromax x 5 days eRx for albuterol inhaler Rx for Hycodan cough syrup  RTC as needed or if symptoms persist.

## 2014-07-22 NOTE — Patient Instructions (Addendum)

## 2014-07-22 NOTE — Progress Notes (Signed)
Pre visit review using our clinic review tool, if applicable. No additional management support is needed unless otherwise documented below in the visit note. 

## 2014-07-28 ENCOUNTER — Other Ambulatory Visit: Payer: Self-pay | Admitting: Family Medicine

## 2014-07-29 NOTE — Telephone Encounter (Signed)
Rx faxed to requested pharmacy 

## 2014-08-31 DIAGNOSIS — J069 Acute upper respiratory infection, unspecified: Secondary | ICD-10-CM | POA: Diagnosis not present

## 2014-09-17 ENCOUNTER — Ambulatory Visit (INDEPENDENT_AMBULATORY_CARE_PROVIDER_SITE_OTHER): Payer: Medicare Other | Admitting: Family Medicine

## 2014-09-17 ENCOUNTER — Ambulatory Visit (INDEPENDENT_AMBULATORY_CARE_PROVIDER_SITE_OTHER)
Admission: RE | Admit: 2014-09-17 | Discharge: 2014-09-17 | Disposition: A | Discharge status: Home / Self Care | Payer: Medicare Other | Source: Ambulatory Visit | Attending: Family Medicine | Admitting: Family Medicine

## 2014-09-17 VITALS — BP 118/70 | HR 85 | Temp 97.9°F | Wt 251.5 lb

## 2014-09-17 DIAGNOSIS — R059 Cough, unspecified: Secondary | ICD-10-CM

## 2014-09-17 DIAGNOSIS — J019 Acute sinusitis, unspecified: Secondary | ICD-10-CM | POA: Diagnosis not present

## 2014-09-17 DIAGNOSIS — R05 Cough: Secondary | ICD-10-CM

## 2014-09-17 DIAGNOSIS — R0602 Shortness of breath: Secondary | ICD-10-CM | POA: Diagnosis not present

## 2014-09-17 DIAGNOSIS — J209 Acute bronchitis, unspecified: Secondary | ICD-10-CM | POA: Diagnosis not present

## 2014-09-17 DIAGNOSIS — R058 Other specified cough: Secondary | ICD-10-CM

## 2014-09-17 MED ORDER — HYDROCODONE-HOMATROPINE 5-1.5 MG/5ML PO SYRP: 5.0000 mL | ORAL_SOLUTION | Freq: Three times a day (TID) | ORAL | Status: DC | PRN

## 2014-09-17 MED ORDER — TRAMADOL HCL 50 MG PO TABS: 50.0000 mg | ORAL_TABLET | Freq: Four times a day (QID) | ORAL | Status: DC | PRN

## 2014-09-17 NOTE — Patient Instructions (Signed)
Good to see you. We will call you with your xray results. Please call Dr. Gwenette Greet to make an appointment.

## 2014-09-17 NOTE — Addendum Note (Signed)
Addended by: Lucille Passy on: 09/17/2014 01:19 PM   Modules accepted: Orders

## 2014-09-17 NOTE — Progress Notes (Signed)
Subjective:   Patient ID: Nicholas [name], male    DOB: 1941/02/20, 73 y.o.   MRN: [id number]  Nicholas [name] is a pleasant 73 y.o. year old male who presents to clinic today with congestion in chest  on 09/17/2014  HPI: Here for persistent cough and congestion.  Saw [name] Silversmith on 07/22/14 for acute bronchitis/sinusitis- treated with Zpack and albuterol and hycodan prn.  Symptoms initially improved a little bit but never went away.  He then went [facility] 1.5 weeks ago, given "a stronger abx," he is not sure which one.  Symptoms are improved- no fevers or chills but having persistent productive cough, SOB and congestion. No CP. Still having sinus and ear pressure.  Former smoker.  H/o OSA- followed by Dr. Gwenette Greet.  Wonders if he should see him for this since his cough is persistent.  Current Outpatient Prescriptions on File Prior to Visit  Medication Sig Dispense Refill  . albuterol (PROVENTIL HFA;VENTOLIN HFA) 108 (90 BASE) MCG/ACT inhaler Inhale 2 puffs into the lungs every 6 (six) hours as needed for wheezing or shortness of breath. 1 Inhaler 0  . aspirin 81 MG tablet Take 81 mg by mouth daily.    . fluticasone (FLONASE) 50 MCG/ACT nasal spray Place 2 sprays into both nostrils daily. 16 g 6  . gabapentin (NEURONTIN) 400 MG capsule TAKE 1 CAPSULE BY MOUTH 4 TIMES DAILY. 120 capsule 1  . metFORMIN (GLUCOPHAGE) 500 MG tablet TAKE 1 TABLET BY MOUTH 2 TIMES DAILY WITH A MEAL. 180 tablet 3  . naproxen sodium (ANAPROX) 220 MG tablet Take 220 mg by mouth 2 (two) times daily as needed. For pain    . Tamsulosin HCl (FLOMAX) 0.4 MG CAPS Take 0.8 mg by mouth at bedtime.     . [DISCONTINUED] hydrochlorothiazide (MICROZIDE) 12.5 MG capsule Take 1 capsule (12.5 mg total) by mouth daily. 30 capsule 0   No current facility-administered medications on file prior to visit.    Allergies  Allergen Reactions  . Sudafed [Pseudoephedrine Hcl]     "makes me feel drunk"    Past Medical History  Diagnosis  Date  . Diabetes mellitus     boarderline  . BPH (benign prostatic hypertrophy)   . Neuropathy   . OSA on CPAP     cpap- doe snot know settings   . Arthritis   . Sinus infection     dx 12/13   . Blister of foot or toe     left ssecond toe healing per note of 09/21/12   . Heart murmur     noted on visit of 09/20/12- [facility]     Past Surgical History  Procedure Laterality Date  . Cholecystectomy    . Laparotomy  12/23/2011    Procedure: EXPLORATORY LAPAROTOMY;  Surgeon: Odis Hollingshead, MD;  Location: [facility];  Service: General;  Laterality: N/A;  . Appendectomy  12/23/2011    Procedure: APPENDECTOMY;  Surgeon: Odis Hollingshead, MD;  Location: [facility];  Service: General;  Laterality: N/A;  . Colostomy revision  12/23/2011    Procedure: COLON RESECTION SIGMOID;  Surgeon: Odis Hollingshead, MD;  Location: [facility];  Service: General;  Laterality: N/AJeanette Caprice procedure    . Cardiac catheterization      [facility]  . Bilateral hand surgery       metal placed in hands due to trauma in 2011   . Right shoulder surgery       related to trauma   .  Colostomy takedown  09/27/2012    Procedure: LAPAROSCOPIC COLOSTOMY TAKEDOWN;  Surgeon: Odis Hollingshead, MD;  Location: [facility];  Service: General;  Laterality: N/A;  Laparoscopic Colostomy Takedown  . Incise and drain abcess      sebaceous cyst lanced in office on lt shoulder    Family History  Problem Relation Age of Onset  . Diabetes Mother   . Cancer Mother     skin  . Heart disease Mother   . Heart attack Mother   . Cancer Father     Leukemia  . Colon cancer Neg Hx     History   Social History  . Marital Status: Married    Spouse Name: N/A    Number of Children: N/A  . Years of Education: N/A   Occupational History  . Futures trader    Social History Main Topics  . Smoking status: Former Smoker -- 2.00 packs/day for 40 years    Types: Cigarettes    Quit date: 10/10/1993  . Smokeless tobacco: Former Systems developer  .  Alcohol Use: No  . Drug Use: No  . Sexual Activity: Not on file   Other Topics Concern  . Not on file   Social History Narrative  . No narrative on file   The PMH, PSH, Social History, Family History, Medications, and allergies have been reviewed in [facility], and have been updated if relevant.   Review of Systems  Constitutional: Positive for fatigue. Negative for fever and chills.  HENT: Positive for congestion, postnasal drip, rhinorrhea and sinus pressure. Negative for trouble swallowing.   Respiratory: Positive for cough and shortness of breath. Negative for chest tightness and stridor.   Cardiovascular: Negative.   Gastrointestinal: Negative.   Neurological: Negative.        Objective:    BP 118/70 mmHg  Pulse 85  Temp(Src) 97.9 F (36.6 C) (Oral)  Wt 251 lb 8 oz (114.08 kg)  SpO2 94%   Physical Exam  Constitutional: He is oriented to person, place, and time. He appears well-developed and well-nourished. No distress.  HENT:  Head: Normocephalic.  Eyes: Pupils are equal, round, and reactive to light.  Cardiovascular: Normal rate.   Pulmonary/Chest: Effort normal and breath sounds normal.  Neurological: He is alert and oriented to person, place, and time.  Skin: Skin is warm and dry.  Psychiatric: He has a normal mood and affect. His behavior is normal. Judgment and thought content normal.          Assessment & Plan:   Cough - Plan: DG Chest 2 View  Acute sinusitis with symptoms greater than 10 days  Acute bronchitis, unspecified organism - Plan: HYDROcodone-homatropine (HYCODAN) 5-1.5 MG/5ML syrup  Cough present for greater than 3 weeks No Follow-up on file.

## 2014-09-17 NOTE — Assessment & Plan Note (Signed)
Deteriorated. Discussed work up with Nicholas [name] at length. Lung exam reassuring- no abnormal breath sounds. Given duration of symptoms, will get CXR today, refilled cough suppressant. He is inquiring about another course of abx- explained to him that further abx not warranted at this time. Absolutely should follow up with pulmonary if symptoms continue. The patient indicates understanding of these issues and agrees with the plan.

## 2014-09-17 NOTE — Progress Notes (Signed)
Pre visit review using our clinic review tool, if applicable. No additional management support is needed unless otherwise documented below in the visit note. 

## 2014-10-02 ENCOUNTER — Ambulatory Visit (INDEPENDENT_AMBULATORY_CARE_PROVIDER_SITE_OTHER): Payer: Medicare Other | Admitting: Pulmonary Disease

## 2014-10-02 ENCOUNTER — Encounter: Payer: Self-pay | Admitting: Pulmonary Disease

## 2014-10-02 VITALS — BP 122/74 | HR 85 | Temp 97.0°F | Ht 72.0 in | Wt 250.4 lb

## 2014-10-02 DIAGNOSIS — J449 Chronic obstructive pulmonary disease, unspecified: Secondary | ICD-10-CM

## 2014-10-02 DIAGNOSIS — J019 Acute sinusitis, unspecified: Secondary | ICD-10-CM

## 2014-10-02 DIAGNOSIS — J438 Other emphysema: Secondary | ICD-10-CM | POA: Diagnosis not present

## 2014-10-02 DIAGNOSIS — R059 Cough, unspecified: Secondary | ICD-10-CM

## 2014-10-02 DIAGNOSIS — J439 Emphysema, unspecified: Secondary | ICD-10-CM | POA: Insufficient documentation

## 2014-10-02 DIAGNOSIS — R05 Cough: Secondary | ICD-10-CM | POA: Diagnosis not present

## 2014-10-02 NOTE — Progress Notes (Signed)
   Subjective:    Patient ID: Nicholas [name], male    DOB: 1941/02/26, 73 y.o.   MRN: [id number]  HPI The patient is a 73 year old male who I've been asked to see for worsening dyspnea. The patient states that he has had breathing issues for at least 2 years, but feels they are progressive in nature. He is describing a limitation to airflow, and it significantly impacts his quality of life. He describes a 2-3 block dyspnea on exertion at a moderate pace on flat ground, and will get winded walking up one flight of stairs. He has some cough with scant mucus production that is sometimes discolored, but tells me that he has had 2 episodes of bronchitis over the last few months that have required antibiotics. He does have a lot of postnasal drip, and is snorting quite a bit during our visit.  He does have some sinus pressure that bothers him, but denies any history of recurrent sinusitis. He has a history of smoking 2 packs per day for 40 years, but has not smoked since 1995. He has had a recent chest x-ray that shows no acute process.   Review of Systems  Constitutional: Negative for fever and unexpected weight change.  HENT: Positive for congestion, ear pain, postnasal drip and rhinorrhea. Negative for dental problem, nosebleeds, sinus pressure, sneezing, sore throat and trouble swallowing.   Eyes: Negative for redness and itching.  Respiratory: Positive for cough, chest tightness and shortness of breath. Negative for wheezing.   Cardiovascular: Negative for palpitations and leg swelling.  Gastrointestinal: Negative for nausea and vomiting.  Genitourinary: Negative for dysuria.  Musculoskeletal: Negative for joint swelling.  Skin: Negative for rash.  Neurological: Negative for headaches.  Hematological: Does not bruise/bleed easily.  Psychiatric/Behavioral: Negative for dysphoric mood. The patient is not nervous/anxious.        Objective:   Physical Exam Constitutional: obese male, no acute  distress  HENT:  Nares patent without discharge  Oropharynx without exudate, palate and uvula are elongated  Eyes:  Perrla, eomi, no scleral icterus  Neck:  No JVD, no TMG  Cardiovascular:  Normal rate, regular rhythm, no rubs or gallops.  No murmurs        Intact distal pulses  Pulmonary :  Normal breath sounds, no stridor or respiratory distress   No rales, rhonchi, or wheezing  Abdominal:  Soft, nondistended, bowel sounds present.  No tenderness noted.   Musculoskeletal:  No lower extremity edema noted.  Lymph Nodes:  No cervical lymphadenopathy noted  Skin:  No cyanosis noted  Neurologic:  Alert, appropriate, moves all 4 extremities without obvious deficit.         Assessment & Plan:

## 2014-10-02 NOTE — Assessment & Plan Note (Signed)
The patient has moderate airflow obstruction by his spirometry today, and this is most consistent with COPD associated with emphysema.   He is clearly having daily symptoms of dyspnea on exertion, and is also having increased airway issues when he gets a cold or sinus infection. I would like to start him on a bronchodilator regimen with a LABA/ LAMA,  And see him back in about 4 weeks to check on his progress. I also stressed to him the importance of weight reduction and a regular exercise program.

## 2014-10-02 NOTE — Assessment & Plan Note (Signed)
The patient has had acute sinusitis recently by his history, and continues to have postnasal drip and some pressure. He has been treated with 2 rounds of antibiotics, and I would like to add topical nasal steroids to see if we can facilitate better drainage. If he continues to have issues, we may have to imaged his sinuses to evaluate for chronic sinusitis.

## 2014-10-02 NOTE — Patient Instructions (Signed)
You have moderate emphysema/copd by your breathing studies. Will start you on stiolto 2 inhalations each am everyday.   Try nasonex 2 sprays each nostril each am for next 2 weeks.  If this helps a lot and you want to stay on this, please call and we can send in prescription.  followup with me again in 4 weeks.

## 2014-10-08 ENCOUNTER — Other Ambulatory Visit: Payer: Self-pay | Admitting: *Deleted

## 2014-10-08 MED ORDER — FLUTICASONE PROPIONATE 50 MCG/ACT NA SUSP: 2.0000 | Freq: Every day | NASAL | Status: DC

## 2014-10-31 ENCOUNTER — Ambulatory Visit: Payer: Medicare Other | Admitting: Pulmonary Disease

## 2014-11-05 ENCOUNTER — Other Ambulatory Visit: Payer: Self-pay | Admitting: Family Medicine

## 2014-11-05 NOTE — Telephone Encounter (Signed)
Pt requesting refill. Last Rx 07/2014. Last f/u appt 10/2013

## 2014-11-06 NOTE — Telephone Encounter (Signed)
Rx called in to requested pharmacy 

## 2014-11-06 NOTE — Telephone Encounter (Signed)
Last f/u visit 10/2013. All other appts have been acute. Pls advise

## 2014-11-10 ENCOUNTER — Encounter: Payer: Self-pay | Admitting: Pulmonary Disease

## 2014-11-10 ENCOUNTER — Ambulatory Visit (INDEPENDENT_AMBULATORY_CARE_PROVIDER_SITE_OTHER): Payer: Medicare Other | Admitting: Pulmonary Disease

## 2014-11-10 VITALS — BP 144/78 | HR 73 | Temp 97.0°F | Ht 72.0 in | Wt 251.2 lb

## 2014-11-10 DIAGNOSIS — J438 Other emphysema: Secondary | ICD-10-CM

## 2014-11-10 MED ORDER — TIOTROPIUM BROMIDE-OLODATEROL 2.5-2.5 MCG/ACT IN AERS: 2.0000 | INHALATION_SPRAY | Freq: Every day | RESPIRATORY_TRACT | Status: DC

## 2014-11-10 NOTE — Assessment & Plan Note (Signed)
The patient felt that stiolto made a significant difference to his breathing, and would like to stay on this inhaler on a regular basis. I've also stressed to him the importance of aggressive weight loss and some type of conditioning program. I will see him back in 6 months to check on progress, but he is to call if having issues prior to that appointment.

## 2014-11-10 NOTE — Patient Instructions (Signed)
Stay on stiolto 2 inhalations each am.  Will send in prescription for you. Stay on your nasal spray as long as you are having issues  Work on weight loss and conditioning program followup with me again in 39mos, but call if breathing issues.

## 2014-11-10 NOTE — Progress Notes (Signed)
   Subjective:    Patient ID: Nicholas [name], male    DOB: [birth year], 74 y.o.   MRN: [id number]  HPI The patient comes in today for follow-up of his known COPD. He was started on stiolto at the last visit, and saw significant improvement in his breathing. He has run out of the sample, and notes worsening of his breathing since being off the medication. He would like to stay on this going forward.   Review of Systems  Constitutional: Negative for fever and unexpected weight change.  HENT: Positive for congestion and postnasal drip. Negative for dental problem, ear pain, nosebleeds, rhinorrhea, sinus pressure, sneezing, sore throat and trouble swallowing.   Eyes: Negative for redness and itching.  Respiratory: Positive for cough and shortness of breath. Negative for chest tightness and wheezing.   Cardiovascular: Negative for palpitations and leg swelling.  Gastrointestinal: Negative for nausea and vomiting.  Genitourinary: Negative for dysuria.  Musculoskeletal: Negative for joint swelling.  Skin: Negative for rash.  Neurological: Negative for headaches.  Hematological: Does not bruise/bleed easily.  Psychiatric/Behavioral: Negative for dysphoric mood. The patient is not nervous/anxious.        Objective:   Physical Exam Obese male in no acute distress Nose without purulence or discharge noted Neck without lymphadenopathy or thyromegaly Chest with mildly decreased breath sounds, no active wheezing Cardiac exam with regular rate and rhythm Lower extremities with no significant edema, no cyanosis Alert and oriented, moves all 4 extremities.        Assessment & Plan:

## 2014-12-25 ENCOUNTER — Other Ambulatory Visit: Payer: Self-pay

## 2014-12-25 MED ORDER — TRAMADOL HCL 50 MG PO TABS: 50.0000 mg | ORAL_TABLET | Freq: Four times a day (QID) | ORAL | Status: DC | PRN

## 2014-12-25 NOTE — Telephone Encounter (Signed)
Rx called in to requested pharmacy 

## 2014-12-25 NOTE — Telephone Encounter (Signed)
Mrs Nosbisch left v/m requesting refill for tramadol to [facility]; Mrs Bardon request cb because after she talked with pharmacy she request larger quantity than # 180 so pt can get full benefit from ins coverage.Please advise.

## 2015-01-04 ENCOUNTER — Other Ambulatory Visit: Payer: Self-pay | Admitting: Family Medicine

## 2015-02-09 ENCOUNTER — Other Ambulatory Visit: Payer: Self-pay | Admitting: Family Medicine

## 2015-02-09 NOTE — Telephone Encounter (Signed)
Pt has not had recent f/u appt

## 2015-02-09 NOTE — Telephone Encounter (Signed)
Rx called in to requested pharmacy 

## 2015-02-11 ENCOUNTER — Encounter: Payer: Self-pay | Admitting: Family Medicine

## 2015-02-11 ENCOUNTER — Ambulatory Visit (INDEPENDENT_AMBULATORY_CARE_PROVIDER_SITE_OTHER): Payer: Medicare Other | Admitting: Family Medicine

## 2015-02-11 VITALS — BP 134/72 | HR 72 | Temp 97.9°F | Wt 255.8 lb

## 2015-02-11 DIAGNOSIS — E785 Hyperlipidemia, unspecified: Secondary | ICD-10-CM

## 2015-02-11 DIAGNOSIS — E1140 Type 2 diabetes mellitus with diabetic neuropathy, unspecified: Secondary | ICD-10-CM | POA: Diagnosis not present

## 2015-02-11 DIAGNOSIS — J438 Other emphysema: Secondary | ICD-10-CM | POA: Diagnosis not present

## 2015-02-11 DIAGNOSIS — Z23 Encounter for immunization: Secondary | ICD-10-CM

## 2015-02-11 LAB — COMPREHENSIVE METABOLIC PANEL
ALK PHOS: 63 U/L (ref 39–117)
ALT: 24 U/L (ref 0–53)
AST: 17 U/L (ref 0–37)
Albumin: 4 g/dL (ref 3.5–5.2)
BUN: 14 mg/dL (ref 6–23)
CO2: 28 meq/L (ref 19–32)
Calcium: 9.7 mg/dL (ref 8.4–10.5)
Chloride: 101 mEq/L (ref 96–112)
Creatinine, Ser: 1.03 mg/dL (ref 0.40–1.50)
GFR: 75.24 mL/min (ref >60.00)
GLUCOSE: 213 mg/dL — AB (ref 70–99)
POTASSIUM: 4.4 meq/L (ref 3.5–5.1)
SODIUM: 133 meq/L — AB (ref 135–145)
TOTAL PROTEIN: 7.2 g/dL (ref 6.0–8.3)
Total Bilirubin: 0.4 mg/dL (ref 0.2–1.2)

## 2015-02-11 LAB — MICROALBUMIN / CREATININE URINE RATIO
Creatinine,U: 76.7 mg/dL
MICROALB/CREAT RATIO: 5.5 mg/g (ref 0.0–30.0)
Microalb, Ur: 4.2 mg/dL — ABNORMAL HIGH (ref 0.0–1.9)

## 2015-02-11 LAB — LIPID PANEL
Cholesterol: 174 mg/dL (ref 0–200)
HDL: 39.3 mg/dL (ref >39.00)
NonHDL: 134.7
Total CHOL/HDL Ratio: 4
Triglycerides: 224 mg/dL — ABNORMAL HIGH (ref 0.0–149.0)
VLDL: 44.8 mg/dL — ABNORMAL HIGH (ref 0.0–40.0)

## 2015-02-11 LAB — LDL CHOLESTEROL, DIRECT: Direct LDL: 102 mg/dL

## 2015-02-11 LAB — HEMOGLOBIN A1C: Hgb A1c MFr Bld: 8 % — ABNORMAL HIGH (ref 4.6–6.5)

## 2015-02-11 NOTE — Assessment & Plan Note (Signed)
Due for labs today. Recheck urine micro- may need to start ACEI based on results. Advised to set up eye exam. prevnar 13 given today.

## 2015-02-11 NOTE — Addendum Note (Signed)
Addended by: Modena Nunnery on: 02/11/2015 11:14 AM   Modules accepted: Orders

## 2015-02-11 NOTE — Progress Notes (Signed)
Pre visit review using our clinic review tool, if applicable. No additional management support is needed unless otherwise documented below in the visit note. 

## 2015-02-11 NOTE — Progress Notes (Signed)
74  yo here to follow up chronic medical conditions.   He has a h/o diabetic neuropathy with minimal feeling in his toes. Lab Results  Component Value Date   HGBA1C 6.8* 03/18/2014   On gabapentin 400 mg three times daily.  Could not afford Lyrica.  Feels Gabapentin helps some. Also takes tramadol in the evening   DM- On Metformin 500 mg twice daily Due for labs. Urine micro positive 03/2014. LDL has been at goal. Does not check FSBS regularly.  Denies any recent episodes of hypoglycemia.  Lab Results  Component Value Date   HGBA1C 6.8* 03/18/2014   Lab Results  Component Value Date   CHOL 175 03/18/2014   HDL 35.10* 03/18/2014   LDLCALC 83 03/18/2014   LDLDIRECT 114.7 07/01/2010   TRIG 285.0* 03/18/2014   CHOLHDL 5 03/18/2014     Patient Active Problem List   Diagnosis Date Noted  . COPD (chronic obstructive pulmonary disease) with emphysema 10/02/2014  . Cough present for greater than 3 weeks 09/17/2014  . Diabetes mellitus with neurological manifestations 03/18/2014  . Insomnia 10/18/2013  . Incisional hernia, without obstruction or gangrene 03/11/2013  . Descending colostomy s/p reversal 09/27/12 09/28/2012  . Murmur, cardiac 09/20/2012  . Vascular abnormality 09/13/2012  . Colostomy care 04/05/2012  . Diverticulitis of colon with perforation 12/24/2011  . Diabetic neuropathy 08/25/2011  . PERIPHERAL NEUROPATHY 12/02/2010  . HALLUX RIGIDUS, ACQUIRED 10/14/2010  . OTHER CONGENITAL VALGUS DEFORMITY OF FEET 10/14/2010  . PES PLANUS 10/13/2010  . HYPERLIPIDEMIA 07/01/2010  . CHRONIC PAIN DUE TO TRAUMA 02/26/2010  . SLEEP APNEA, OBSTRUCTIVE 08/28/2009  . BENIGN PROSTATIC HYPERTROPHY, MILD, HX OF 08/28/2009   Past Medical History  Diagnosis Date  . Diabetes mellitus     boarderline  . BPH (benign prostatic hypertrophy)   . Neuropathy   . OSA on CPAP     cpap- doe snot know settings   . Arthritis   . Sinus infection     dx 12/13   . Blister of foot or toe      left ssecond toe healing per note of 09/21/12   . Heart murmur     noted on visit of 09/20/12- [facility]    Past Surgical History  Procedure Laterality Date  . Cholecystectomy    . Laparotomy  12/23/2011    Procedure: EXPLORATORY LAPAROTOMY;  Surgeon: Odis Hollingshead, MD;  Location: [facility];  Service: General;  Laterality: N/A;  . Appendectomy  12/23/2011    Procedure: APPENDECTOMY;  Surgeon: Odis Hollingshead, MD;  Location: [facility];  Service: General;  Laterality: N/A;  . Colostomy revision  12/23/2011    Procedure: COLON RESECTION SIGMOID;  Surgeon: Odis Hollingshead, MD;  Location: [facility];  Service: General;  Laterality: N/AJeanette Caprice procedure    . Cardiac catheterization      [facility]  . Bilateral hand surgery       metal placed in hands due to trauma in 2011   . Right shoulder surgery       related to trauma   . Colostomy takedown  09/27/2012    Procedure: LAPAROSCOPIC COLOSTOMY TAKEDOWN;  Surgeon: Odis Hollingshead, MD;  Location: [facility];  Service: General;  Laterality: N/A;  Laparoscopic Colostomy Takedown  . Incise and drain abcess      sebaceous cyst lanced in office on lt shoulder   History  Substance Use Topics  . Smoking status: Former Smoker -- 2.00 packs/day for 40 years  Types: Cigarettes    Quit date: 10/10/1993  . Smokeless tobacco: Former Systems developer  . Alcohol Use: No   Family History  Problem Relation Age of Onset  . Diabetes Mother   . Cancer Mother     skin  . Heart disease Mother   . Heart attack Mother   . Cancer Father     Leukemia  . Colon cancer Neg Hx    Allergies  Allergen Reactions  . Sudafed [Pseudoephedrine Hcl]     "makes me feel drunk"   Current Outpatient Prescriptions on File Prior to Visit  Medication Sig Dispense Refill  . albuterol (PROVENTIL HFA;VENTOLIN HFA) 108 (90 BASE) MCG/ACT inhaler Inhale 2 puffs into the lungs every 6 (six) hours as needed for wheezing or shortness of breath. 1 Inhaler 0  . aspirin 81 MG tablet Take 81 mg by  mouth daily.    . fluticasone (FLONASE) 50 MCG/ACT nasal spray Place 2 sprays into both nostrils daily. 16 g 6  . gabapentin (NEURONTIN) 400 MG capsule TAKE 1 CAPSULE BY MOUTH 4 TIMES DAILY. 120 capsule 1  . metFORMIN (GLUCOPHAGE) 500 MG tablet TAKE 1 TABLET BY MOUTH 2 TIMES DAILY WITH A MEAL. 180 tablet 3  . naproxen sodium (ANAPROX) 220 MG tablet Take 220 mg by mouth 2 (two) times daily as needed. For pain    . Tamsulosin HCl (FLOMAX) 0.4 MG CAPS Take 0.8 mg by mouth at bedtime.     . Tiotropium Bromide-Olodaterol (STIOLTO RESPIMAT) 2.5-2.5 MCG/ACT AERS Inhale 2 puffs into the lungs daily. 4 g 6  . traMADol (ULTRAM) 50 MG tablet TAKE 1 TABLET EVERY 6 HOURS AS NEEDED FOR PAIN MUST LAST 30 DAYS 180 tablet 0  . [DISCONTINUED] hydrochlorothiazide (MICROZIDE) 12.5 MG capsule Take 1 capsule (12.5 mg total) by mouth daily. 30 capsule 0   No current facility-administered medications on file prior to visit.   The PMH, PSH, Social History, Family History, Medications, and allergies have been reviewed in [facility], and have been updated if relevant.   REVIEW OF SYSTEMS  Review of Systems  Constitutional: Negative.   HENT: Negative.   Eyes: Negative.   Respiratory: Negative.   Endocrine: Negative.   Genitourinary: Negative.   Musculoskeletal: Negative.   Skin: Negative.   Allergic/Immunologic: Negative.   Neurological: Positive for numbness.  Hematological: Negative.   Psychiatric/Behavioral: Negative.   All other systems reviewed and are negative.    Physical Exam  BP 134/72 mmHg  Pulse 72  Temp(Src) 97.9 F (36.6 C) (Oral)  Wt 255 lb 12 oz (116.007 kg)  SpO2 97%  Physical Exam  Constitutional: He is oriented to person, place, and time and well-developed, well-nourished, and in no distress. No distress.  HENT:  Head: Normocephalic.  Eyes: Conjunctivae are normal.  Cardiovascular: Normal rate.   Pulmonary/Chest: Effort normal.  Musculoskeletal: Normal range of motion.  Neurological:  He is alert and oriented to person, place, and time.  Skin: Skin is warm and dry.  Psychiatric: Mood, memory, affect and judgment normal.  Nursing note and vitals reviewed.    Impression & Recommendations:    Type 2 diabetes mellitus with diabetic neuropathy  Other emphysema

## 2015-02-11 NOTE — Assessment & Plan Note (Signed)
Stable, unfortunately cannot alleviate his symptoms completely. Continue current rx for now as he is pleased with current pain control.

## 2015-02-11 NOTE — Patient Instructions (Addendum)
Please schedule your eye exam. We will call you with your lab results.  Please say hi to your wife for me.

## 2015-02-11 NOTE — Assessment & Plan Note (Signed)
Due for labs. Check today.

## 2015-02-12 ENCOUNTER — Other Ambulatory Visit: Payer: Self-pay | Admitting: Family Medicine

## 2015-02-12 MED ORDER — METFORMIN HCL 1000 MG PO TABS: ORAL_TABLET | ORAL | Status: DC

## 2015-02-19 ENCOUNTER — Telehealth: Payer: Self-pay

## 2015-02-19 NOTE — Telephone Encounter (Signed)
Nicholas [name] request new order for diabetic meter, test strips and lancets. Nicholas Borowiak will ck with ins co to see what meter,strips and lancets are approved and Nicholas Rumbold will cb with info.

## 2015-02-20 NOTE — Telephone Encounter (Signed)
Mrs Hoar called back and she checked with ins co and was told for the doctor to choose whatever diabetic meter and supplies the doctor thought was appropriate. Mrs Barish request cb when rx ready for pick up.

## 2015-02-27 NOTE — Telephone Encounter (Signed)
Nicholas [name] left v/m requesting cb about prescription for diabetic supplies.

## 2015-03-02 MED ORDER — ONETOUCH ULTRASOFT LANCETS MISC: Status: AC

## 2015-03-02 MED ORDER — GLUCOSE BLOOD VI STRP: ORAL_STRIP | Status: DC

## 2015-03-02 MED ORDER — ONETOUCH ULTRA SYSTEM W/DEVICE KIT: 1.0000 | PACK | Freq: Once | Status: AC

## 2015-03-02 NOTE — Telephone Encounter (Signed)
Ok to send in rx for any glucometer that pharmacy has and is affordable for pt.

## 2015-03-02 NOTE — Addendum Note (Signed)
Addended by: Modena Nunnery on: 03/02/2015 02:53 PM   Modules accepted: Orders

## 2015-03-07 ENCOUNTER — Other Ambulatory Visit: Payer: Self-pay | Admitting: Family Medicine

## 2015-03-30 ENCOUNTER — Other Ambulatory Visit: Payer: Self-pay | Admitting: Family Medicine

## 2015-03-30 NOTE — Telephone Encounter (Signed)
Rx called in to requested pharmacy 

## 2015-03-30 NOTE — Telephone Encounter (Signed)
Last f/u 02/2015

## 2015-04-07 ENCOUNTER — Other Ambulatory Visit: Payer: Self-pay | Admitting: Family Medicine

## 2015-04-07 DIAGNOSIS — E119 Type 2 diabetes mellitus without complications: Secondary | ICD-10-CM | POA: Diagnosis not present

## 2015-04-07 DIAGNOSIS — E1165 Type 2 diabetes mellitus with hyperglycemia: Secondary | ICD-10-CM

## 2015-04-07 LAB — HM DIABETES EYE EXAM

## 2015-05-02 ENCOUNTER — Other Ambulatory Visit: Payer: Self-pay | Admitting: Family Medicine

## 2015-05-11 ENCOUNTER — Other Ambulatory Visit: Payer: Self-pay | Admitting: Family Medicine

## 2015-05-11 ENCOUNTER — Ambulatory Visit: Payer: Medicare Other | Admitting: Pulmonary Disease

## 2015-05-11 NOTE — Telephone Encounter (Signed)
Last f/u appt 02/2015 

## 2015-05-11 NOTE — Telephone Encounter (Signed)
Rx called in to requested pharmacy 

## 2015-05-21 ENCOUNTER — Encounter: Payer: Self-pay | Admitting: Internal Medicine

## 2015-05-21 ENCOUNTER — Ambulatory Visit (INDEPENDENT_AMBULATORY_CARE_PROVIDER_SITE_OTHER): Payer: Medicare Other | Admitting: Internal Medicine

## 2015-05-21 VITALS — BP 134/70 | HR 84 | Ht 72.0 in | Wt 255.0 lb

## 2015-05-21 DIAGNOSIS — G4733 Obstructive sleep apnea (adult) (pediatric): Secondary | ICD-10-CM

## 2015-05-21 DIAGNOSIS — J209 Acute bronchitis, unspecified: Secondary | ICD-10-CM

## 2015-05-21 DIAGNOSIS — J432 Centrilobular emphysema: Secondary | ICD-10-CM

## 2015-05-21 MED ORDER — ALBUTEROL SULFATE HFA 108 (90 BASE) MCG/ACT IN AERS: 2.0000 | INHALATION_SPRAY | Freq: Four times a day (QID) | RESPIRATORY_TRACT | Status: DC | PRN

## 2015-05-21 NOTE — Patient Instructions (Signed)
We can continue CPAP 15/ Advanced  Ok to continue Stiolto maintenance inhaler 2 puffs, one time daily  Script to keep an albuterol rescue inhaler   2 puffs ever 4-6 hours when needed for chest tightness/ wheezing  Please call if we can help

## 2015-05-21 NOTE — Progress Notes (Signed)
Subjective:    Patient ID: Nicholas [name], male    DOB: Feb 26, 1941, 74 y.o.   MRN: [id number]  HPI  11/10/14- Dr Gwenette Greet The patient comes in today for follow-up of his known COPD. He was started on stiolto at the last visit, and saw significant improvement in his breathing. He has run out of the sample, and notes worsening of his breathing since being off the medication. He would like to stay on this going forward.  05/21/15- 57 yo former smoker followed for COPD/ E, cough, OSA FOLLOWS [id number] CPAP machine for about 8 hours(gets up and down through the night); DME is [facility]. Pt states his breathing is avg. Coughs  at times and SOB; states inhalers help some.  NPSG 2007, AHI 40/ hr  Advanced  Spirometry 10/02/14- Moderate obstruction, FEV1 55%, FVC 67% Download shows excellent CPAP compliance and control with pressure 15/Advanced Stiolto Respimat helps CXR 09/17/14 IMPRESSION: Biapical pleural parenchymal thickening most consistent with scarring. No acute cardiopulmonary disease. Electronically Signed  By: Marcello Moores Register  On: 09/17/2014 11:35  ROS-see HPI   Negative unless "+" Constitutional:    weight loss, night sweats, fevers, chills, fatigue, lassitude. HEENT:    headaches, difficulty swallowing, tooth/dental problems, sore throat,       sneezing, itching, ear ache, nasal congestion, post nasal drip, snoring CV:    chest pain, orthopnea, PND, swelling in lower extremities, anasarca,                                                  dizziness, palpitations Resp:   shortness of breath with exertion or at rest.                productive cough,   non-productive cough, coughing up of blood.              change in color of mucus.  wheezing.   Skin:    rash or lesions. GI:  No-   heartburn, indigestion, abdominal pain, nausea, vomiting,  GU:  MS:   joint pain, stiffness,  Neuro-     nothing unusual Psych:  change in mood or affect.  depression or anxiety.   memory loss.  Objective:    OBJ- Physical Exam General- Alert, Oriented, Affect-appropriate, Distress- none acute Skin- rash-none, lesions- none, excoriation- none Lymphadenopathy- none Head- atraumatic            Eyes- Gross vision intact, PERRLA, conjunctivae and secretions clear            Ears- Hearing, canals-normal            Nose- Clear, no-Septal dev, mucus, polyps, erosion, perforation             Throat- Mallampati II , mucosa clear , drainage- none, tonsils- atrophic Neck- flexible , trachea midline, no stridor , thyroid nl, carotid no bruit Chest - symmetrical excursion , unlabored           Heart/CV- RRR , no murmur , no gallop  , no rub, nl s1 s2                           - JVD- none , edema- none, stasis changes- none, varices- none           Lung- clear to P&A, wheeze- none, cough-  none , dullness-none, rub- none           Chest wall-  Abd-  Br/ Gen/ Rectal- Not done, not indicated Extrem- cyanosis- none, clubbing, none, atrophy- none, strength- nl Neuro- grossly intact to observation            Assessment & Plan:

## 2015-05-23 NOTE — Assessment & Plan Note (Signed)
Good symptom control at this time using Stiolto

## 2015-05-23 NOTE — Assessment & Plan Note (Signed)
Good compliance and control demonstrated on download, CPAP 15/Advanced

## 2015-05-26 ENCOUNTER — Encounter: Payer: Self-pay | Admitting: Internal Medicine

## 2015-05-26 ENCOUNTER — Other Ambulatory Visit (INDEPENDENT_AMBULATORY_CARE_PROVIDER_SITE_OTHER): Payer: Medicare Other | Admitting: *Deleted

## 2015-05-26 ENCOUNTER — Ambulatory Visit (INDEPENDENT_AMBULATORY_CARE_PROVIDER_SITE_OTHER): Payer: Medicare Other | Admitting: Internal Medicine

## 2015-05-26 VITALS — BP 122/64 | HR 81 | Temp 97.9°F | Resp 12 | Ht 72.0 in | Wt 256.2 lb

## 2015-05-26 DIAGNOSIS — E1140 Type 2 diabetes mellitus with diabetic neuropathy, unspecified: Secondary | ICD-10-CM | POA: Diagnosis not present

## 2015-05-26 LAB — POCT GLYCOSYLATED HEMOGLOBIN (HGB A1C): HEMOGLOBIN A1C: 8.1

## 2015-05-26 MED ORDER — LINAGLIPTIN 5 MG PO TABS: 5.0000 mg | ORAL_TABLET | Freq: Every day | ORAL | Status: DC

## 2015-05-26 MED ORDER — GLUCOSE BLOOD VI STRP: ORAL_STRIP | Status: DC

## 2015-05-26 NOTE — Patient Instructions (Signed)
Please continue Metformin 500 mg 2x a day with meals. Please add Tradjenta 5 mg in am.   Stop Pepsi and Gatorade.  Please schedule an appt with Antonieta Iba with nutrition.  Please return in 1.5 months with your sugar log.   PATIENT INSTRUCTIONS FOR TYPE 2 DIABETES:  **Please join MyChart!** - see attached instructions about how to join if you have not done so already.  DIET AND EXERCISE Diet and exercise is an important part of diabetic treatment.  We recommended aerobic exercise in the form of brisk walking (working between 40-60% of maximal aerobic capacity, similar to brisk walking) for 150 minutes per week (such as 30 minutes five days per week) along with 3 times per week performing 'resistance' training (using various gauge rubber tubes with handles) 5-10 exercises involving the major muscle groups (upper body, lower body and core) performing 10-15 repetitions (or near fatigue) each exercise. Start at half the above goal but build slowly to reach the above goals. If limited by weight, joint pain, or disability, we recommend daily walking in a swimming pool with water up to waist to reduce pressure from joints while allow for adequate exercise.    BLOOD GLUCOSES Monitoring your blood glucoses is important for continued management of your diabetes. Please check your blood glucoses 2-4 times a day: fasting, before meals and at bedtime (you can rotate these measurements - e.g. one day check before the 3 meals, the next day check before 2 of the meals and before bedtime, etc.).   HYPOGLYCEMIA (low blood sugar) Hypoglycemia is usually a reaction to not eating, exercising, or taking too much insulin/ other diabetes drugs.  Symptoms include tremors, sweating, hunger, confusion, headache, etc. Treat IMMEDIATELY with 15 grams of Carbs: . 4 glucose tablets .  cup regular juice/soda . 2 tablespoons raisins . 4 teaspoons sugar . 1 tablespoon honey Recheck blood glucose in 15 mins and repeat  above if still symptomatic/blood glucose <100.  RECOMMENDATIONS TO REDUCE YOUR RISK OF DIABETIC COMPLICATIONS: * Take your prescribed MEDICATION(S) * Follow a DIABETIC diet: Complex carbs, fiber rich foods, (monounsaturated and polyunsaturated) fats * AVOID saturated/trans fats, high fat foods, >2,300 mg salt per day. * EXERCISE at least 5 times a week for 30 minutes or preferably daily.  * DO NOT SMOKE OR DRINK more than 1 drink a day. * Check your FEET every day. Do not wear tightfitting shoes. Contact us if you develop an ulcer * See your EYE doctor once a year or more if needed * Get a FLU shot once a year * Get a PNEUMONIA vaccine once before and once after age 49 years  GOALS:  * Your Hemoglobin A1c of <7%  * fasting sugars need to be <130 * after meals sugars need to be <180 (2h after you start eating) * Your Systolic BP should be 371 or lower  * Your Diastolic BP should be 80 or lower  * Your HDL (Good Cholesterol) should be 40 or higher  * Your LDL (Bad Cholesterol) should be 100 or lower. * Your Triglycerides should be 150 or lower  * Your Urine microalbumin (kidney function) should be <30 * Your Body Mass Index should be 25 or lower    Please consider the following ways to cut down carbs and fat and increase fiber and micronutrients in your diet: - substitute whole grain for white bread or pasta - substitute brown rice for white rice - substitute 90-calorie flat bread pieces for slices of bread  when possible - substitute sweet potatoes or yams for white potatoes - substitute humus for margarine - substitute tofu for cheese when possible - substitute almond or rice milk for regular milk (would not drink soy milk daily due to concern for soy estrogen influence on breast cancer risk) - substitute dark chocolate for other sweets when possible - substitute water - can add lemon or orange slices for taste - for diet sodas (artificial sweeteners will trick your body that you  can eat sweets without getting calories and will lead you to overeating and weight gain in the long run) - do not skip breakfast or other meals (this will slow down the metabolism and will result in more weight gain over time)  - can try smoothies made from fruit and almond/rice milk in am instead of regular breakfast - can also try old-fashioned (not instant) oatmeal made with almond/rice milk in am - order the dressing on the side when eating salad at a restaurant (pour less than half of the dressing on the salad) - eat as little meat as possible - can try juicing, but should not forget that juicing will get rid of the fiber, so would alternate with eating raw veg./fruits or drinking smoothies - use as little oil as possible, even when using olive oil - can dress a salad with a mix of balsamic vinegar and lemon juice, for e.g. - use agave nectar, stevia sugar, or regular sugar rather than artificial sweateners - steam or broil/roast veggies  - snack on veggies/fruit/nuts (unsalted, preferably) when possible, rather than processed foods - reduce or eliminate aspartame in diet (it is in diet sodas, chewing gum, etc) Read the labels!  Try to read Dr. Janene Harvey book: "Program for Reversing Diabetes" for other ideas for healthy eating.

## 2015-05-26 NOTE — Progress Notes (Signed)
Patient ID: Nicholas [name], male   DOB: 09-01-1941, 74 y.o.   MRN: [id number]  HPI: Nicholas [name] is a 74 y.o.-year-old male, referred by his PCP, Dr. Deborra Medina, for management of DM2, dx in 2012-2013, non-insulin-dependent, uncontrolled, with complications (PN).  Last hemoglobin A1c was: Lab Results  Component Value Date   HGBA1C 8.0* 02/11/2015   HGBA1C 6.8* 03/18/2014   HGBA1C 6.9* 05/24/2013   Pt is on a regimen of: - Metformin - prev. 1000 mg 2x a day, with meals (increased after last HbA1c returned) >> dizzy, confusion >> now 500 mg 2x a day  Pt checks his sugars 2x a day and they are: - am: 154-204 - 2h after b'fast: n/c - before lunch: n/c - 2h after lunch: n/c - before dinner: n/c - 2h after dinner: n/c - bedtime: 136-284, 301 - nighttime: n/c No lows. Lowest sugar was 127; he has hypoglycemia awareness at 70.  Highest sugar was 301 (apple pie).  Glucometer: One Touch Ultra  Pt's meals are: - Breakfast: raisin bran cereal and coffee - Lunch: grilled chicken + bun + fries or veggie plate and tea - Dinner: meat + 2 veggies + regular Pepsi - Snacks: 4 Drinks: Gatorade 4x a day, Pepsi  - + mild CKD, last BUN/creatinine:  Lab Results  Component Value Date   BUN 14 02/11/2015   CREATININE 1.03 02/11/2015   - last set of lipids: Lab Results  Component Value Date   CHOL 174 02/11/2015   HDL 39.30 02/11/2015   LDLCALC 83 03/18/2014   LDLDIRECT 102.0 02/11/2015   TRIG 224.0* 02/11/2015   CHOLHDL 4 02/11/2015  Not on a statin.  - last eye exam was in 04/07/2015. No DR. Dr. Idolina Primer. - no numbness and tingling in his feet.  Pt has FH of DM in mother., MGF, brother.  He also has a history of OSA, on CPAP.  ROS: Constitutional: + weight gain, + fatigue, no subjective hyperthermia/hypothermia, + nocturia Eyes: no blurry vision, no xerophthalmia ENT: no sore throat, no nodules palpated in throat, no dysphagia/odynophagia, no hoarseness, + decreased hearing Cardiovascular: no  CP/+ SOB/no palpitations/+ leg swelling Respiratory: no cough/+ SOB Gastrointestinal: no N/V/D/C, + acid reflux Musculoskeletal: + muscle/+ joint aches Skin: no rashes Neurological: no tremors/numbness/tingling/dizziness Psychiatric: no depression/anxiety  Past Medical History  Diagnosis Date  . Diabetes mellitus     boarderline  . BPH (benign prostatic hypertrophy)   . Neuropathy   . OSA on CPAP     cpap- doe snot know settings   . Arthritis   . Sinus infection     dx 12/13   . Blister of foot or toe     left ssecond toe healing per note of 09/21/12   . Heart murmur     noted on visit of 09/20/12- [facility]    Past Surgical History  Procedure Laterality Date  . Cholecystectomy    . Laparotomy  12/23/2011    Procedure: EXPLORATORY LAPAROTOMY;  Surgeon: Odis Hollingshead, MD;  Location: [facility];  Service: General;  Laterality: N/A;  . Appendectomy  12/23/2011    Procedure: APPENDECTOMY;  Surgeon: Odis Hollingshead, MD;  Location: [facility];  Service: General;  Laterality: N/A;  . Colostomy revision  12/23/2011    Procedure: COLON RESECTION SIGMOID;  Surgeon: Odis Hollingshead, MD;  Location: [facility];  Service: General;  Laterality: N/AJeanette Caprice procedure    . Cardiac catheterization      [facility]  . Bilateral  hand surgery       metal placed in hands due to trauma in 2011   . Right shoulder surgery       related to trauma   . Colostomy takedown  09/27/2012    Procedure: LAPAROSCOPIC COLOSTOMY TAKEDOWN;  Surgeon: Odis Hollingshead, MD;  Location: [facility];  Service: General;  Laterality: N/A;  Laparoscopic Colostomy Takedown  . Incise and drain abcess      sebaceous cyst lanced in office on lt shoulder   Social History   Social History  . Marital Status: Married    Spouse Name: N/A  . Number of Children: 5   Occupational History  . Futures trader    Social History Main Topics  . Smoking status: Former Smoker -- 2.00 packs/day for 40 years    Types: Cigarettes    Quit  date: 2008  . Smokeless tobacco: Former Systems developer  . Alcohol Use: No  . Drug Use: No   Current Outpatient Prescriptions on File Prior to Visit  Medication Sig Dispense Refill  . albuterol (PROVENTIL HFA;VENTOLIN HFA) 108 (90 BASE) MCG/ACT inhaler Inhale 2 puffs into the lungs every 6 (six) hours as needed for wheezing or shortness of breath. 1 Inhaler 11  . aspirin 81 MG tablet Take 81 mg by mouth daily.    . Blood Glucose Monitoring Suppl (ONE TOUCH ULTRA SYSTEM KIT) W/DEVICE KIT 1 kit by Does not apply route once. Use as directed to test blood sugar once daily  Dx:E11.49 1 each 0  . fluticasone (FLONASE) 50 MCG/ACT nasal spray Place 2 sprays into both nostrils daily. 16 g 6  . gabapentin (NEURONTIN) 400 MG capsule TAKE ONE CAPSULE BY MOUTH 4 TIMES A DAY 120 capsule 1  . glucose blood (ONE TOUCH ULTRA TEST) test strip Use as instructed 100 each 3  . Lancets (ONETOUCH ULTRASOFT) lancets Use as instructed 100 each 3  . metFORMIN (GLUCOPHAGE) 1000 MG tablet TAKE 1 TABLET BY MOUTH 2 TIMES DAILY WITH A MEAL. 180 tablet 3  . naproxen sodium (ANAPROX) 220 MG tablet Take 220 mg by mouth 2 (two) times daily as needed. For pain    . Tamsulosin HCl (FLOMAX) 0.4 MG CAPS Take 0.8 mg by mouth at bedtime.     . Tiotropium Bromide-Olodaterol (STIOLTO RESPIMAT) 2.5-2.5 MCG/ACT AERS Inhale 2 puffs into the lungs daily. 4 g 6  . traMADol (ULTRAM) 50 MG tablet TAKE 1 TABLET BY MOUTH EVERY 6 HOURS AS NEEDED FOR PAIN 180 tablet 0  . [DISCONTINUED] hydrochlorothiazide (MICROZIDE) 12.5 MG capsule Take 1 capsule (12.5 mg total) by mouth daily. 30 capsule 0   No current facility-administered medications on file prior to visit.   Allergies  Allergen Reactions  . Sudafed [Pseudoephedrine Hcl]     "makes me feel drunk"   Family History  Problem Relation Age of Onset  . Diabetes Mother   . Cancer Mother     skin  . Heart disease Mother   . Heart attack Mother   . Cancer Father     Leukemia  . Colon cancer Neg Hx     PE: BP 122/64 mmHg  Pulse 81  Temp(Src) 97.9 F (36.6 C) (Oral)  Resp 12  Ht 6' (1.829 m)  Wt 256 lb 3.2 oz (116.212 kg)  BMI 34.74 kg/m2  SpO2 95% Wt Readings from Last 3 Encounters:  05/26/15 256 lb 3.2 oz (116.212 kg)  05/21/15 255 lb (115.667 kg)  02/11/15 255 lb 12 oz (116.007 kg)  Constitutional: overweight, in NAD Eyes: PERRLA, EOMI, no exophthalmos ENT: moist mucous membranes, no thyromegaly, no cervical lymphadenopathy Cardiovascular: RRR, + 2/6 SEM, no RG, + bilateral nonpitting edema Respiratory: CTA B Gastrointestinal: abdomen soft, NT, ND, BS+ Musculoskeletal: no deformities, strength intact in all 4 Skin: moist, warm, no rashes Neurological: + mild tremor with outstretched hands, DTR normal in all 4  ASSESSMENT: 1. DM2, non-insulin-dependent, uncontrolled, with complications - diabetic peripheral neuropathy  PLAN:  1. Patient with long-standing, uncontrolled diabetes, on oral antidiabetic regimen, which became insufficient. He tried a higher dose of metformin (1000 mg 2x a day), however, he could not tolerate this because of dizziness and confusion. He is now taking 500 mg twice a day. One of the main problems is that he drinks a lot of sugary drinks throughout the day. I strongly advised him to stop this and will also refer him to nutrition. He also eats his dinner late. We will add a DPP 4 inhibitor to help him cover his meals, but the mainstay of the treatment would be for him to cut out sugary drinks. - We discussed about options for treatment, and I suggested to:  Patient Instructions  Please continue Metformin 500 mg 2x a day with meals. Please add Tradjenta 5 mg in am.   Stop Pepsi and Gatorade.  Please schedule an appt with Antonieta Iba with nutrition.  Please return in 1.5 months with your sugar log.   - Strongly advised him to start checking sugars at different times of the day - check 1-2 times a day, rotating checks - given sugar log and advised  how to fill it and to bring it at next appt  - given foot care handout and explained the principles  - given instructions for hypoglycemia management "15-15 rule"  - advised for yearly eye exams >> he is UTD - checked HbA1c today >> 8.1% (stable, higher than target) - Return to clinic in 1.5 mo with sugar log

## 2015-05-28 ENCOUNTER — Ambulatory Visit: Payer: Medicare Other | Admitting: Family Medicine

## 2015-05-28 ENCOUNTER — Ambulatory Visit (INDEPENDENT_AMBULATORY_CARE_PROVIDER_SITE_OTHER): Payer: Medicare Other | Admitting: Family Medicine

## 2015-05-28 ENCOUNTER — Encounter: Payer: Self-pay | Admitting: Family Medicine

## 2015-05-28 VITALS — BP 136/66 | HR 71 | Temp 97.7°F | Wt 256.5 lb

## 2015-05-28 DIAGNOSIS — E1143 Type 2 diabetes mellitus with diabetic autonomic (poly)neuropathy: Secondary | ICD-10-CM

## 2015-05-28 DIAGNOSIS — G4700 Insomnia, unspecified: Secondary | ICD-10-CM | POA: Diagnosis not present

## 2015-05-28 DIAGNOSIS — E785 Hyperlipidemia, unspecified: Secondary | ICD-10-CM | POA: Diagnosis not present

## 2015-05-28 DIAGNOSIS — E1140 Type 2 diabetes mellitus with diabetic neuropathy, unspecified: Secondary | ICD-10-CM | POA: Diagnosis not present

## 2015-05-28 NOTE — Progress Notes (Signed)
74  yo here to follow up chronic medical conditions.   He has a h/o diabetic neuropathy with minimal feeling in his toes. Lab Results  Component Value Date   HGBA1C 8.1 05/26/2015   On gabapentin 400 mg three times daily.  Could not afford Lyrica.  Feels Gabapentin helps some. Also takes tramadol in the evening   DM- On Metformin 500 mg twice daily.  In May 2016, a1c deteriorated so I attempted to increase Metformin to 1000 mg twice daily.  He did not tolerate this well, referred to endocrinology.  a1c remains poorly controlled.  Saw Dr. Cruzita Lederer on 05/26/2015 (two days ago)- note reviewed.  Trajenta added.  Took first dose yesterday.  This morning FSBS 161.  Urine micro positive 02/2015 LDL has been at goal. Does not check FSBS regularly.  Denies any recent episodes of hypoglycemia.  Lab Results  Component Value Date   HGBA1C 8.1 05/26/2015   Lab Results  Component Value Date   CHOL 174 02/11/2015   HDL 39.30 02/11/2015   LDLCALC 83 03/18/2014   LDLDIRECT 102.0 02/11/2015   TRIG 224.0* 02/11/2015   CHOLHDL 4 02/11/2015     Patient Active Problem List   Diagnosis Date Noted  . COPD (chronic obstructive pulmonary disease) with emphysema 10/02/2014  . Cough present for greater than 3 weeks 09/17/2014  . Diabetes mellitus with neurological manifestations 03/18/2014  . Insomnia 10/18/2013  . Incisional hernia, without obstruction or gangrene 03/11/2013  . Descending colostomy s/p reversal 09/27/12 09/28/2012  . Murmur, cardiac 09/20/2012  . Vascular abnormality 09/13/2012  . Colostomy care 04/05/2012  . Diverticulitis of colon with perforation 12/24/2011  . Diabetic neuropathy 08/25/2011  . PERIPHERAL NEUROPATHY 12/02/2010  . HALLUX RIGIDUS, ACQUIRED 10/14/2010  . OTHER CONGENITAL VALGUS DEFORMITY OF FEET 10/14/2010  . PES PLANUS 10/13/2010  . HLD (hyperlipidemia) 07/01/2010  . CHRONIC PAIN DUE TO TRAUMA 02/26/2010  . Obstructive sleep apnea 08/28/2009  . BENIGN  PROSTATIC HYPERTROPHY, MILD, HX OF 08/28/2009   Past Medical History  Diagnosis Date  . Diabetes mellitus     boarderline  . BPH (benign prostatic hypertrophy)   . Neuropathy   . OSA on CPAP     cpap- doe snot know settings   . Arthritis   . Sinus infection     dx 12/13   . Blister of foot or toe     left ssecond toe healing per note of 09/21/12   . Heart murmur     noted on visit of 09/20/12- [facility]    Past Surgical History  Procedure Laterality Date  . Cholecystectomy    . Laparotomy  12/23/2011    Procedure: EXPLORATORY LAPAROTOMY;  Surgeon: Odis Hollingshead, MD;  Location: [facility];  Service: General;  Laterality: N/A;  . Appendectomy  12/23/2011    Procedure: APPENDECTOMY;  Surgeon: Odis Hollingshead, MD;  Location: [facility];  Service: General;  Laterality: N/A;  . Colostomy revision  12/23/2011    Procedure: COLON RESECTION SIGMOID;  Surgeon: Odis Hollingshead, MD;  Location: [facility];  Service: General;  Laterality: N/AJeanette Caprice procedure    . Cardiac catheterization      [facility]  . Bilateral hand surgery       metal placed in hands due to trauma in 2011   . Right shoulder surgery       related to trauma   . Colostomy takedown  09/27/2012    Procedure: LAPAROSCOPIC COLOSTOMY TAKEDOWN;  Surgeon: Odis Hollingshead,  MD;  Location: [facility];  Service: General;  Laterality: N/A;  Laparoscopic Colostomy Takedown  . Incise and drain abcess      sebaceous cyst lanced in office on lt shoulder   Social History  Substance Use Topics  . Smoking status: Former Smoker -- 2.00 packs/day for 40 years    Types: Cigarettes    Quit date: 10/10/1993  . Smokeless tobacco: Former User  . Alcohol Use: No   Family History  Problem Relation Age of Onset  . Diabetes Mother   . Cancer Mother     skin  . Heart disease Mother   . Heart attack Mother   . Cancer Father     Leukemia  . Colon cancer Neg Hx    Allergies  Allergen Reactions  . Sudafed [Pseudoephedrine Hcl]     "makes me feel  drunk"   Current Outpatient Prescriptions on File Prior to Visit  Medication Sig Dispense Refill  . albuterol (PROVENTIL HFA;VENTOLIN HFA) 108 (90 BASE) MCG/ACT inhaler Inhale 2 puffs into the lungs every 6 (six) hours as needed for wheezing or shortness of breath. 1 Inhaler 11  . aspirin 81 MG tablet Take 81 mg by mouth daily.    . Blood Glucose Monitoring Suppl (ONE TOUCH ULTRA SYSTEM KIT) W/DEVICE KIT 1 kit by Does not apply route once. Use as directed to test blood sugar once daily  Dx:E11.49 1 each 0  . fluticasone (FLONASE) 50 MCG/ACT nasal spray Place 2 sprays into both nostrils daily. 16 g 6  . gabapentin (NEURONTIN) 400 MG capsule TAKE ONE CAPSULE BY MOUTH 4 TIMES A DAY 120 capsule 1  . glucose blood (ONE TOUCH ULTRA TEST) test strip Use 2x a day 200 each 11  . Lancets (ONETOUCH ULTRASOFT) lancets Use as instructed 100 each 3  . linagliptin (TRADJENTA) 5 MG TABS tablet Take 1 tablet (5 mg total) by mouth daily. 30 tablet 2  . naproxen sodium (ANAPROX) 220 MG tablet Take 220 mg by mouth 2 (two) times daily as needed. For pain    . Tamsulosin HCl (FLOMAX) 0.4 MG CAPS Take 0.8 mg by mouth at bedtime.     . Tiotropium Bromide-Olodaterol (STIOLTO RESPIMAT) 2.5-2.5 MCG/ACT AERS Inhale 2 puffs into the lungs daily. 4 g 6  . traMADol (ULTRAM) 50 MG tablet TAKE 1 TABLET BY MOUTH EVERY 6 HOURS AS NEEDED FOR PAIN 180 tablet 0  . metFORMIN (GLUCOPHAGE) 1000 MG tablet TAKE 1 TABLET BY MOUTH 2 TIMES DAILY WITH A MEAL. (Patient not taking: Reported on 05/28/2015) 180 tablet 3  . [DISCONTINUED] hydrochlorothiazide (MICROZIDE) 12.5 MG capsule Take 1 capsule (12.5 mg total) by mouth daily. 30 capsule 0   No current facility-administered medications on file prior to visit.   The PMH, PSH, Social History, Family History, Medications, and allergies have been reviewed in [facility], and have been updated if relevant.   REVIEW OF SYSTEMS  Review of Systems  Constitutional: Negative.   HENT: Negative.   Eyes:  Negative.   Respiratory: Negative.   Endocrine: Negative.   Genitourinary: Negative.   Musculoskeletal: Negative.   Skin: Negative.   Allergic/Immunologic: Negative.   Neurological: Positive for numbness.  Hematological: Negative.   Psychiatric/Behavioral: Negative.   All other systems reviewed and are negative.    Physical Exam  BP 136/66 mmHg  Pulse 71  Temp(Src) 97.7 F (36.5 C) (Oral)  Wt 256 lb 8 oz (116.348 kg)  SpO2 96%  Physical Exam  Constitutional: He is oriented to person,   place, and time and well-developed, well-nourished, and in no distress. No distress.  HENT:  Head: Normocephalic.  Eyes: Conjunctivae are normal.  Cardiovascular: Normal rate.   Pulmonary/Chest: Effort normal.  Musculoskeletal: Normal range of motion.  Neurological: He is alert and oriented to person, place, and time.  Skin: Skin is warm and dry.  Psychiatric: Mood, memory, affect and judgment normal.  Nursing note and vitals reviewed.    Impression & Recommendations:    Type 2 diabetes mellitus with diabetic neuropathy  HLD (hyperlipidemia)  Insomnia

## 2015-05-28 NOTE — Assessment & Plan Note (Signed)
Referred to endocrinology. Has follow up scheduled already with endocrinology

## 2015-05-28 NOTE — Progress Notes (Signed)
Pre visit review using our clinic review tool, if applicable. No additional management support is needed unless otherwise documented below in the visit note. 

## 2015-05-28 NOTE — Assessment & Plan Note (Addendum)
Feels he is tolerating ok with Gabapentin and Tramadol.

## 2015-06-01 ENCOUNTER — Encounter: Payer: Self-pay | Admitting: Internal Medicine

## 2015-06-02 ENCOUNTER — Encounter: Payer: Medicare Other | Admitting: Nutrition

## 2015-06-12 ENCOUNTER — Encounter: Payer: Medicare Other | Admitting: Dietician

## 2015-06-23 DIAGNOSIS — N401 Enlarged prostate with lower urinary tract symptoms: Secondary | ICD-10-CM | POA: Diagnosis not present

## 2015-06-24 ENCOUNTER — Other Ambulatory Visit: Payer: Self-pay | Admitting: Family Medicine

## 2015-06-25 DIAGNOSIS — N401 Enlarged prostate with lower urinary tract symptoms: Secondary | ICD-10-CM | POA: Diagnosis not present

## 2015-06-25 DIAGNOSIS — N138 Other obstructive and reflux uropathy: Secondary | ICD-10-CM | POA: Diagnosis not present

## 2015-06-25 NOTE — Telephone Encounter (Signed)
Rx called in as prescribed 

## 2015-06-25 NOTE — Telephone Encounter (Signed)
Last f/u appt 05/2015 

## 2015-07-02 ENCOUNTER — Other Ambulatory Visit: Payer: Self-pay | Admitting: Family Medicine

## 2015-07-07 ENCOUNTER — Ambulatory Visit (INDEPENDENT_AMBULATORY_CARE_PROVIDER_SITE_OTHER): Payer: Medicare Other | Admitting: Internal Medicine

## 2015-07-07 ENCOUNTER — Encounter: Payer: Self-pay | Admitting: Internal Medicine

## 2015-07-07 VITALS — BP 122/70 | HR 102 | Temp 98.0°F | Resp 12 | Wt 251.4 lb

## 2015-07-07 DIAGNOSIS — E1140 Type 2 diabetes mellitus with diabetic neuropathy, unspecified: Secondary | ICD-10-CM

## 2015-07-07 MED ORDER — METFORMIN HCL 500 MG PO TABS: ORAL_TABLET | ORAL | Status: DC

## 2015-07-07 MED ORDER — GLIPIZIDE ER 5 MG PO TB24
5.0000 mg | ORAL_TABLET | Freq: Every day | ORAL | Status: DC
Start: 2015-07-07 — End: 2015-09-07

## 2015-07-07 NOTE — Patient Instructions (Signed)
Please stop Tradjenta.  Please continue Metformin 500 mg 2x a day, with meals.  Please start Glipizide ER 5 mg before breakfast.  Please let me know if the sugars are consistently <80 or >200.  Please come back for a follow-up appointment in 2 months.

## 2015-07-07 NOTE — Progress Notes (Signed)
Patient ID: Nicholas [name], male   DOB: [birth year], 74 y.o.   MRN: [id number]  HPI: Nicholas [name] is a 74 y.o.-year-old male, returning for f/u for DM2, dx in 2012-2013, non-insulin-dependent, uncontrolled, with complications (PN). Last visit 1.5 mo ago.  Last hemoglobin A1c was: Lab Results  Component Value Date   HGBA1C 8.1 05/26/2015   HGBA1C 8.0* 02/11/2015   HGBA1C 6.8* 03/18/2014   Pt is on a regimen of: - Metformin - prev. 1000 mg 2x a day, with meals (increased after last HbA1c returned) >> dizzy, confusion >> now 500 mg 2x a day - Tradjenta 5 mg in am (170$ for 1 mo) - started 05/2015 - takes it at night!  Pt checks his sugars 3x a day and they are: - am: 154-204 >> 122-164, 191 - 2h after b'fast: n/c - before lunch: n/c >> 119, 232 - 2h after lunch: n/c >> 162-182, 247 - before dinner: n/c >> 113-192 - 2h after dinner: n/c >> 128 - bedtime: 136-284, 301 >> 117-180, 195 - nighttime: n/c No lows. Lowest sugar was 113; he has hypoglycemia awareness at 70.  Highest sugar was 301 (apple pie) >> 247  Glucometer: One Touch Ultra  He stopped Pepsi and Gatorade. Stopped dairy.  Pt's meals are: - Breakfast: PB + jelly toast and coffee - Lunch: grilled chicken + bun + fries or veggie plate and tea - Dinner: meat + 2 veggies - Snacks: 4 He did not see Antonieta Iba with nutrition >> accidentally, this appt was scheduled with Leonia Reader with DM education.  - + mild CKD, last BUN/creatinine:  Lab Results  Component Value Date   BUN 14 02/11/2015   CREATININE 1.03 02/11/2015   - last set of lipids: Lab Results  Component Value Date   CHOL 174 02/11/2015   HDL 39.30 02/11/2015   LDLCALC 83 03/18/2014   LDLDIRECT 102.0 02/11/2015   TRIG 224.0* 02/11/2015   CHOLHDL 4 02/11/2015  Not on a statin.  - last eye exam was in 04/07/2015. No DR. + Cataracts. Dr. Idolina Primer. - no numbness and tingling in his feet.  He also has a history of OSA, on CPAP.  ROS: Constitutional: no  weight gain/loss, no fatigue, no subjective hyperthermia/hypothermia Eyes: + blurry vision (cataracts), no xerophthalmia ENT: no sore throat, no nodules palpated in throat, no dysphagia/odynophagia, no hoarseness Cardiovascular: no CP/SOB/palpitations/leg swelling Respiratory: no cough/SOB Gastrointestinal: no N/V/D/C Musculoskeletal: no muscle/joint aches Skin: no rashes Neurological: no tremors/numbness/tingling/dizziness  I reviewed pt's medications, allergies, PMH, social hx, family hx, and changes were documented in the history of present illness. Otherwise, unchanged from my initial visit note.  Past Medical History  Diagnosis Date  . Diabetes mellitus     boarderline  . BPH (benign prostatic hypertrophy)   . Neuropathy   . OSA on CPAP     cpap- doe snot know settings   . Arthritis   . Sinus infection     dx 12/13   . Blister of foot or toe     left ssecond toe healing per note of 09/21/12   . Heart murmur     noted on visit of 09/20/12- [facility]    Past Surgical History  Procedure Laterality Date  . Cholecystectomy    . Laparotomy  12/23/2011    Procedure: EXPLORATORY LAPAROTOMY;  Surgeon: Odis Hollingshead, MD;  Location: [facility];  Service: General;  Laterality: N/A;  . Appendectomy  12/23/2011    Procedure: APPENDECTOMY;  Surgeon: Sherren Mocha  Adalberto Cole, MD;  Location: [facility];  Service: General;  Laterality: N/A;  . Colostomy revision  12/23/2011    Procedure: COLON RESECTION SIGMOID;  Surgeon: Odis Hollingshead, MD;  Location: [facility];  Service: General;  Laterality: N/AJeanette Caprice procedure    . Cardiac catheterization      [facility]  . Bilateral hand surgery       metal placed in hands due to trauma in 2011   . Right shoulder surgery       related to trauma   . Colostomy takedown  09/27/2012    Procedure: LAPAROSCOPIC COLOSTOMY TAKEDOWN;  Surgeon: Odis Hollingshead, MD;  Location: [facility];  Service: General;  Laterality: N/A;  Laparoscopic Colostomy Takedown  . Incise and drain  abcess      sebaceous cyst lanced in office on lt shoulder   Social History   Social History  . Marital Status: Married    Spouse Name: N/A  . Number of Children: 5   Occupational History  . Futures trader    Social History Main Topics  . Smoking status: Former Smoker -- 2.00 packs/day for 40 years    Types: Cigarettes    Quit date: 2008  . Smokeless tobacco: Former Systems developer  . Alcohol Use: No  . Drug Use: No   Current Outpatient Prescriptions on File Prior to Visit  Medication Sig Dispense Refill  . albuterol (PROVENTIL HFA;VENTOLIN HFA) 108 (90 BASE) MCG/ACT inhaler Inhale 2 puffs into the lungs every 6 (six) hours as needed for wheezing or shortness of breath. 1 Inhaler 11  . aspirin 81 MG tablet Take 81 mg by mouth daily.    . Blood Glucose Monitoring Suppl (ONE TOUCH ULTRA SYSTEM KIT) W/DEVICE KIT 1 kit by Does not apply route once. Use as directed to test blood sugar once daily  Dx:E11.49 1 each 0  . fluticasone (FLONASE) 50 MCG/ACT nasal spray Place 2 sprays into both nostrils daily. 16 g 6  . gabapentin (NEURONTIN) 400 MG capsule TAKE ONE CAPSULE BY MOUTH 4 TIMES A DAY 120 capsule 5  . glucose blood (ONE TOUCH ULTRA TEST) test strip Use 2x a day 200 each 11  . Lancets (ONETOUCH ULTRASOFT) lancets Use as instructed 100 each 3  . linagliptin (TRADJENTA) 5 MG TABS tablet Take 1 tablet (5 mg total) by mouth daily. 30 tablet 2  . metFORMIN (GLUCOPHAGE) 1000 MG tablet TAKE 1 TABLET BY MOUTH 2 TIMES DAILY WITH A MEAL. 180 tablet 3  . naproxen sodium (ANAPROX) 220 MG tablet Take 220 mg by mouth 2 (two) times daily as needed. For pain    . Tamsulosin HCl (FLOMAX) 0.4 MG CAPS Take 0.8 mg by mouth at bedtime.     . Tiotropium Bromide-Olodaterol (STIOLTO RESPIMAT) 2.5-2.5 MCG/ACT AERS Inhale 2 puffs into the lungs daily. 4 g 6  . traMADol (ULTRAM) 50 MG tablet TAKE 1 TABLET BY MOUTH EVERY 6 HOURS AS NEEDED FOR PAIN 180 tablet 0  . [DISCONTINUED] hydrochlorothiazide (MICROZIDE)  12.5 MG capsule Take 1 capsule (12.5 mg total) by mouth daily. 30 capsule 0   No current facility-administered medications on file prior to visit.   Allergies  Allergen Reactions  . Sudafed [Pseudoephedrine Hcl]     "makes me feel drunk"   Family History  Problem Relation Age of Onset  . Diabetes Mother   . Cancer Mother     skin  . Heart disease Mother   . Heart attack Mother   .  Cancer Father     Leukemia  . Colon cancer Neg Hx    PE: BP 122/70 mmHg  Pulse 102  Temp(Src) 98 F (36.7 C) (Oral)  Resp 12  Wt 251 lb 6.4 oz (114.034 kg)  SpO2 96% Wt Readings from Last 3 Encounters:  07/07/15 251 lb 6.4 oz (114.034 kg)  05/28/15 256 lb 8 oz (116.348 kg)  05/26/15 256 lb 3.2 oz (116.212 kg)   Constitutional: overweight, in NAD Eyes: PERRLA, EOMI, no exophthalmos ENT: moist mucous membranes, no thyromegaly, no cervical lymphadenopathy Cardiovascular: RRR, + 1/6 SEM, no RG, + bilateral nonpitting edema Respiratory: CTA B Gastrointestinal: abdomen soft, NT, ND, BS+ Musculoskeletal: no deformities, strength intact in all 4 Skin: moist, warm, no rashes Neurological: + mild tremor with outstretched hands, DTR normal in all 4  ASSESSMENT: 1. DM2, non-insulin-dependent, uncontrolled, with complications - diabetic peripheral neuropathy  PLAN:  1. Patient with long-standing, uncontrolled diabetes, on oral antidiabetic regimen, with Metformin 500 mg twice a day + DPP 4 inhibitor, with persistent higher sugars despite eliminating sugary drinks. Also, [employer] is too expensive >> will stop it when he runs out and start Glipizide ER 5 mg daily >> I advised him to let me know if his sugars are still uncontrolled after we start this, in that case, we'll increase to 10 mg daily - I suggested to:  Patient Instructions  Please stop Tradjenta.  Please continue Metformin 500 mg 2x a day, with meals.  Please start Glipizide ER 5 mg before breakfast.  Please let me know if the sugars  are consistently <80 or >200.  Please come back for a follow-up appointment in 2 months.   - Continue checking sugars at different times of the day - check 1-2 times a day, rotating checks - advised for yearly eye exams >> he is UTD - Reviewed together last HbA1c: 8.1% (stable, higher than target) - Return to clinic in 2 mo with sugar log - will check his hemoglobin A1c then.

## 2015-07-09 ENCOUNTER — Encounter: Payer: Medicare Other | Attending: Internal Medicine | Admitting: Dietician

## 2015-07-09 ENCOUNTER — Encounter: Payer: Self-pay | Admitting: Dietician

## 2015-07-09 VITALS — Ht 72.0 in | Wt 251.0 lb

## 2015-07-09 DIAGNOSIS — E1140 Type 2 diabetes mellitus with diabetic neuropathy, unspecified: Secondary | ICD-10-CM | POA: Insufficient documentation

## 2015-07-09 DIAGNOSIS — Z713 Dietary counseling and surveillance: Secondary | ICD-10-CM | POA: Insufficient documentation

## 2015-07-09 NOTE — Progress Notes (Signed)
Diabetes Self-Management Education  Visit Type:  Initial, uncontrolled type 2 diabetes  Appt. Start Time: 1300 Appt. End Time: 8366  07/09/2015  Nicholas [name], identified by name and date of birth, is a 74 y.o. male with a diagnosis of Diabetes:     Patient is here today with his wife.  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   His wife does the shopping and cooking.  He eats out daily for lunch and occasionally at other times.  Hx includes type 2 diabetes for the past 2 years.  HgbA1C has been unchanged in the past 3 months (8.1% 05/26/15).  Since seeing Dr. Cruzita Lederer and brief visit with Vaughan Basta, CDE one month ago, he has eliminated dairy except for frozen yogurt 3 times per week (he used to eat a lot of ice cream nightly), no longer eats cereal, and has eliminated regular soda and gatorade.  He now rarely drinks sweet tea.  He has lost from 256 lbs 1 month ago to 251 lbs today.  After today's visit, patient verbalized need to decrease portion sizes of carbohydrates.  Patient states that he craves sugar all of the time since changing beverage intake.  ASSESSMENT  Height 6' (1.829 m), weight 251 lb (113.853 kg). Body mass index is 34.03 kg/(m^2).    Individualized Plan for Diabetes Self-Management Training:   Learning Objective:  Patient will have a greater understanding of diabetes self-management. Patient education plan is to attend individual and/or group sessions per assessed needs and concerns.   Plan:   Patient Instructions  Per Dr. Arman Filter note, When Tradjenta runs out, begin King each morning. Be as active as possible.  Consistent exercise can reduce your blood sugar. Be consistent with your carbohydrate intake. Aim for 3 Carb Choices per meal (45 grams) +/- 1 either way  Have more nonstarchy vegetables with each meal and fewer carbohydrate portions. Aim for 0-1 Carbs per  snack if hungry  Include protein in moderation with your meals and snacks Consider reading food labels for Total Carbohydrate and Fat Grams of foods Consider checking BG at alternate times per day as directed by MD  Good job on the changes that you have made!  Continue to choose low fat meats (small portions).  Continue to choose drinks without sugar.      Expected Outcomes:   Patient demonstrated interest, good outcome expected.  Education material provided: Living Well with Diabetes, Food label handouts, A1C conversion sheet, Meal plan card, My Plate, Snack sheet and Support group flyer, breakfast ideas  If problems or questions, patient to contact team via:  Phone and Email  Future DSME appointment:  prn

## 2015-07-09 NOTE — Patient Instructions (Addendum)
Per Dr. Arman Filter note, When Tradjenta runs out, begin [place] each morning. Be as active as possible.  Consistent exercise can reduce your blood sugar. Be consistent with your carbohydrate intake. Aim for 3 Carb Choices per meal (45 grams) +/- 1 either way  Have more nonstarchy vegetables with each meal and fewer carbohydrate portions. Aim for 0-1 Carbs per snack if hungry  Include protein in moderation with your meals and snacks Consider reading food labels for Total Carbohydrate and Fat Grams of foods Consider checking BG at alternate times per day as directed by MD  Good job on the changes that you have made!  Continue to choose low fat meats (small portions).  Continue to choose drinks without sugar.

## 2015-07-16 ENCOUNTER — Telehealth: Payer: Self-pay | Admitting: Internal Medicine

## 2015-07-16 NOTE — Telephone Encounter (Signed)
Offer to let him price-compare Anoro Ellipta, 1 puff, once daily, refill x 11

## 2015-07-16 NOTE — Telephone Encounter (Signed)
Spoke with pt, states that his stiolto is too expensive and is requesting a cheaper alternative.   [facility] uses [facility] in [place].    CY please advise on an alternative.  Thanks!

## 2015-07-16 NOTE — Telephone Encounter (Signed)
lmtcb x1 for pt. 

## 2015-07-17 MED ORDER — UMECLIDINIUM-VILANTEROL 62.5-25 MCG/INH IN AEPB: 1.0000 | INHALATION_SPRAY | Freq: Every day | RESPIRATORY_TRACT | Status: DC

## 2015-07-17 NOTE — Telephone Encounter (Signed)
Called and spoke to pt. Informed him of the recs per CY. Rx sent to preferred pharmacy. Pt verbalized understanding and denied any further questions or concerns at this time.   

## 2015-08-12 ENCOUNTER — Other Ambulatory Visit: Payer: Self-pay | Admitting: Family Medicine

## 2015-08-12 NOTE — Telephone Encounter (Signed)
Last f/u 05/2015 

## 2015-08-12 NOTE — Telephone Encounter (Signed)
Rx called in to requested pharmacy 

## 2015-09-02 DIAGNOSIS — J441 Chronic obstructive pulmonary disease with (acute) exacerbation: Secondary | ICD-10-CM | POA: Diagnosis not present

## 2015-09-07 ENCOUNTER — Other Ambulatory Visit (INDEPENDENT_AMBULATORY_CARE_PROVIDER_SITE_OTHER): Payer: Medicare Other | Admitting: *Deleted

## 2015-09-07 ENCOUNTER — Encounter: Payer: Self-pay | Admitting: Internal Medicine

## 2015-09-07 ENCOUNTER — Ambulatory Visit (INDEPENDENT_AMBULATORY_CARE_PROVIDER_SITE_OTHER): Payer: Medicare Other | Admitting: Internal Medicine

## 2015-09-07 VITALS — BP 114/76 | HR 73 | Temp 98.1°F | Resp 12 | Wt 249.6 lb

## 2015-09-07 DIAGNOSIS — E1140 Type 2 diabetes mellitus with diabetic neuropathy, unspecified: Secondary | ICD-10-CM

## 2015-09-07 LAB — POCT GLYCOSYLATED HEMOGLOBIN (HGB A1C): HEMOGLOBIN A1C: 6.1

## 2015-09-07 MED ORDER — GLIPIZIDE ER 5 MG PO TB24: 5.0000 mg | ORAL_TABLET | Freq: Every day | ORAL | Status: DC

## 2015-09-07 NOTE — Progress Notes (Signed)
Patient ID: Nicholas [name], male   DOB: Jul 04, 1941, 74 y.o.   MRN: [id number]  HPI: Nicholas [name] is a 74 y.o.-year-old male, returning for f/u for DM2, dx in 2012-2013, non-insulin-dependent, uncontrolled, with complications ([place]). Last visit 2 mo ago.  He has an URI >> on ABx and Prednisone taper >> sugars much higher. Before then, they were great!  Last hemoglobin A1c was: Lab Results  Component Value Date   HGBA1C 8.1 05/26/2015   HGBA1C 8.0* 02/11/2015   HGBA1C 6.8* 03/18/2014   Pt is on a regimen of: - Metformin - prev. 1000 mg 2x a day, with meals (increased after last HbA1c returned) >> dizzy, confusion >> now 500 mg 2x a day - Glipizide ER 5 mg in am - started 06/2015 He was on Tradjenta 5 mg at night (!) (170$ for 1 mo) - started 05/2015 - takes it at night!  Pt checks his sugars 3x a day and they are before steroids: - am: 154-204 >> 122-164, 191 >> 101-128, 138 - 2h after b'fast: n/c - before lunch: n/c >> 119, 232 >> 155, 161 - 2h after lunch: n/c >> 162-182, 247 >> 89-144 - before dinner: n/c >> 113-192 >> 106-138 - 2h after dinner: n/c >> 128 >> 96 - bedtime: 136-284, 301 >> 117-180, 195 >> 124-175 - nighttime: n/c No lows. Lowest sugar was 113; he has hypoglycemia awareness at 70.  Highest sugar was 301 (apple pie) >> 247 >> 241 with steroids  Glucometer: One Touch Ultra  He stopped Pepsi and Gatorade. Stopped dairy.  Pt's meals are: - Breakfast: PB + jelly toast and coffee - Lunch: grilled chicken + bun + fries or veggie plate and tea - Dinner: meat + 2 veggies - Snacks: 4 He did not see Antonieta Iba with nutrition >> accidentally, this appt was scheduled with Leonia Reader with DM education.  - + mild CKD, last BUN/creatinine:  Lab Results  Component Value Date   BUN 14 02/11/2015   CREATININE 1.03 02/11/2015   - last set of lipids: Lab Results  Component Value Date   CHOL 174 02/11/2015   HDL 39.30 02/11/2015   LDLCALC 83 03/18/2014   LDLDIRECT 102.0  02/11/2015   TRIG 224.0* 02/11/2015   CHOLHDL 4 02/11/2015  Not on a statin.  - last eye exam was in 04/07/2015. No DR. + Cataracts. Dr. Idolina Primer. - no numbness and tingling in his feet.  He also has a history of OSA, on CPAP.  ROS: Constitutional: no weight gain/loss, no fatigue, no subjective hyperthermia/hypothermia Eyes: + blurry vision (cataracts), no xerophthalmia ENT: no sore throat, no nodules palpated in throat, no dysphagia/odynophagia, no hoarseness Cardiovascular: no CP/SOB/palpitations/leg swelling Respiratory: no cough/SOB Gastrointestinal: no N/V/D/C Musculoskeletal: no muscle/joint aches Skin: no rashes Neurological: no tremors/numbness/tingling/dizziness  I reviewed pt's medications, allergies, PMH, social hx, family hx, and changes were documented in the history of present illness. Otherwise, unchanged from my initial visit note.  Past Medical History  Diagnosis Date  . Diabetes mellitus     boarderline  . BPH (benign prostatic hypertrophy)   . Neuropathy ([place])   . OSA on CPAP     cpap- doe snot know settings   . Arthritis   . Sinus infection     dx 12/13   . Blister of foot or toe     left ssecond toe healing per note of 09/21/12   . Heart murmur     noted on visit of 09/20/12- [facility]  Past Surgical History  Procedure Laterality Date  . Cholecystectomy    . Laparotomy  12/23/2011    Procedure: EXPLORATORY LAPAROTOMY;  Surgeon: Odis Hollingshead, MD;  Location: [facility];  Service: General;  Laterality: N/A;  . Appendectomy  12/23/2011    Procedure: APPENDECTOMY;  Surgeon: Odis Hollingshead, MD;  Location: [facility];  Service: General;  Laterality: N/A;  . Colostomy revision  12/23/2011    Procedure: COLON RESECTION SIGMOID;  Surgeon: Odis Hollingshead, MD;  Location: [facility];  Service: General;  Laterality: N/AJeanette Caprice procedure    . Cardiac catheterization      [facility]  . Bilateral hand surgery       metal placed in hands due to trauma in 2011   . Right  shoulder surgery       related to trauma   . Colostomy takedown  09/27/2012    Procedure: LAPAROSCOPIC COLOSTOMY TAKEDOWN;  Surgeon: Odis Hollingshead, MD;  Location: [facility];  Service: General;  Laterality: N/A;  Laparoscopic Colostomy Takedown  . Incise and drain abcess      sebaceous cyst lanced in office on lt shoulder   Social History   Social History  . Marital Status: Married    Spouse Name: N/A  . Number of Children: 5   Occupational History  . Futures trader    Social History Main Topics  . Smoking status: Former Smoker -- 2.00 packs/day for 40 years    Types: Cigarettes    Quit date: 2008  . Smokeless tobacco: Former Systems developer  . Alcohol Use: No  . Drug Use: No   Current Outpatient Prescriptions on File Prior to Visit  Medication Sig Dispense Refill  . albuterol (PROVENTIL HFA;VENTOLIN HFA) 108 (90 BASE) MCG/ACT inhaler Inhale 2 puffs into the lungs every 6 (six) hours as needed for wheezing or shortness of breath. 1 Inhaler 11  . aspirin 81 MG tablet Take 81 mg by mouth daily.    . Blood Glucose Monitoring Suppl (ONE TOUCH ULTRA SYSTEM KIT) W/DEVICE KIT 1 kit by Does not apply route once. Use as directed to test blood sugar once daily  Dx:E11.49 1 each 0  . fluticasone (FLONASE) 50 MCG/ACT nasal spray Place 2 sprays into both nostrils daily. 16 g 6  . gabapentin (NEURONTIN) 400 MG capsule TAKE ONE CAPSULE BY MOUTH 4 TIMES A DAY 120 capsule 5  . glipiZIDE (GLUCOTROL XL) 5 MG 24 hr tablet Take 1 tablet (5 mg total) by mouth daily with breakfast. 90 tablet 1  . glucose blood (ONE TOUCH ULTRA TEST) test strip Use 2x a day 200 each 11  . Lancets (ONETOUCH ULTRASOFT) lancets Use as instructed 100 each 3  . metFORMIN (GLUCOPHAGE) 500 MG tablet TAKE 1 TABLET BY MOUTH 2 TIMES DAILY WITH A MEAL. 180 tablet 1  . naproxen sodium (ANAPROX) 220 MG tablet Take 220 mg by mouth 2 (two) times daily as needed. For pain    . Tamsulosin HCl (FLOMAX) 0.4 MG CAPS Take 0.8 mg by mouth at  bedtime.     . Tiotropium Bromide-Olodaterol (STIOLTO RESPIMAT) 2.5-2.5 MCG/ACT AERS Inhale 2 puffs into the lungs daily. 4 g 6  . traMADol (ULTRAM) 50 MG tablet TAKE 1 TABLET BY MOUTH EVERY 6 HOURS AS NEEDED FOR PAIN 180 tablet 0  . Umeclidinium-Vilanterol 62.5-25 MCG/INH AEPB Inhale 1 puff into the lungs daily. 60 each 11  . [DISCONTINUED] hydrochlorothiazide (MICROZIDE) 12.5 MG capsule Take 1 capsule (12.5 mg total) by mouth  daily. 30 capsule 0   No current facility-administered medications on file prior to visit.   Allergies  Allergen Reactions  . Sudafed [Pseudoephedrine Hcl]     "makes me feel drunk"   Family History  Problem Relation Age of Onset  . Diabetes Mother   . Cancer Mother     skin  . Heart disease Mother   . Heart attack Mother   . Cancer Father     Leukemia  . Colon cancer Neg Hx    PE: BP 114/76 mmHg  Pulse 73  Temp(Src) 98.1 F (36.7 C) (Oral)  Resp 12  Wt 249 lb 9.6 oz (113.218 kg)  SpO2 96% Wt Readings from Last 3 Encounters:  09/07/15 249 lb 9.6 oz (113.218 kg)  07/09/15 251 lb (113.853 kg)  07/07/15 251 lb 6.4 oz (114.034 kg)   Constitutional: overweight, in NAD Eyes: PERRLA, EOMI, no exophthalmos ENT: moist mucous membranes, no thyromegaly, no cervical lymphadenopathy Cardiovascular: RRR, + 1/6 SEM, no RG, no LE edema Respiratory: CTA B Gastrointestinal: abdomen soft, NT, ND, BS+ Musculoskeletal: no deformities, strength intact in all 4 Skin: moist, warm, no rashes Neurological: + mild tremor with outstretched hands, DTR normal in all 4  ASSESSMENT: 1. DM2, non-insulin-dependent, uncontrolled, with complications - diabetic peripheral neuropathy  PLAN:  1. Patient with long-standing, uncontrolled diabetes, now better controlled on Metformin 500 mg twice a day + Glipizide ER 5 mg daily, which we added at last visit. This regimen works greatly for him, except in the last few days after he started a Prednisone taper. I advised him to increase  Glipizide ER if he needs to take steroids again. - I suggested to:  Patient Instructions  Please continue: - Metformin 500 mg 2x a day, with meals. - Glipizide ER 5 mg before breakfast.  If you need to start steroids again, you may need to take 2 tablets of Glipizide ER in am.  Please let me know if the sugars are consistently <80 or >200.  Please come back for a follow-up appointment in 3 months.   - Continue checking sugars at different times of the day - check 1-2 times a day, rotating checks - advised for yearly eye exams >> he is UTD - refuses flu shot - checked HbA1c >> 6.1% (wonderful) - Return to clinic in 3 mo with sugar log

## 2015-09-07 NOTE — Patient Instructions (Signed)
Patient Instructions  Please continue: - Metformin 500 mg 2x a day, with meals. - Glipizide ER 5 mg before breakfast.  If you need to start steroids again, you may need to take 2 tablets of Glipizide ER in am.  Please let me know if the sugars are consistently <80 or >200.  Please come back for a follow-up appointment in 3 months.

## 2015-09-22 ENCOUNTER — Encounter: Payer: Self-pay | Admitting: Family Medicine

## 2015-09-22 ENCOUNTER — Ambulatory Visit (INDEPENDENT_AMBULATORY_CARE_PROVIDER_SITE_OTHER): Payer: Medicare Other | Admitting: Family Medicine

## 2015-09-22 VITALS — BP 142/72 | HR 86 | Temp 97.3°F | Wt 253.2 lb

## 2015-09-22 DIAGNOSIS — L97529 Non-pressure chronic ulcer of other part of left foot with unspecified severity: Secondary | ICD-10-CM | POA: Diagnosis not present

## 2015-09-22 DIAGNOSIS — E11621 Type 2 diabetes mellitus with foot ulcer: Secondary | ICD-10-CM

## 2015-09-22 DIAGNOSIS — L97509 Non-pressure chronic ulcer of other part of unspecified foot with unspecified severity: Secondary | ICD-10-CM

## 2015-09-22 HISTORY — DX: Type 2 diabetes mellitus with foot ulcer: E11.621

## 2015-09-22 HISTORY — DX: Non-pressure chronic ulcer of other part of unspecified foot with unspecified severity: L97.509

## 2015-09-22 MED ORDER — DOXYCYCLINE HYCLATE 100 MG PO TABS: 100.0000 mg | ORAL_TABLET | Freq: Two times a day (BID) | ORAL | Status: DC

## 2015-09-22 NOTE — Progress Notes (Signed)
Subjective:   Patient ID: Nicholas [name], male    DOB: [birth year], 74 y.o.   MRN: [id number]  Nicholas [name] is a pleasant 74 y.o. year old male who presents to clinic today with Foot Pain  on 09/22/2015  HPI:  Diabetic with known peripheral neuropathy, followed by endocrinology.  DM is now well controlled. Last saw Dr. Cruzita Lederer on 09/07/15.   Note reviewed.  Felt some pain on bottom of left foot  Yesterday.  Noticed there were "blisters" between his great and 2nd toes on his left foot.  Felt a little feverish last night as well.  Never walks around barefoot.  "I'm not sure how this happened."  Lab Results  Component Value Date   HGBA1C 6.1 09/07/2015   Current Outpatient Prescriptions on File Prior to Visit  Medication Sig Dispense Refill  . albuterol (PROVENTIL HFA;VENTOLIN HFA) 108 (90 BASE) MCG/ACT inhaler Inhale 2 puffs into the lungs every 6 (six) hours as needed for wheezing or shortness of breath. 1 Inhaler 11  . aspirin 81 MG tablet Take 81 mg by mouth daily.    . Blood Glucose Monitoring Suppl (ONE TOUCH ULTRA SYSTEM KIT) W/DEVICE KIT 1 kit by Does not apply route once. Use as directed to test blood sugar once daily  Dx:E11.49 1 each 0  . fluticasone (FLONASE) 50 MCG/ACT nasal spray Place 2 sprays into both nostrils daily. 16 g 6  . gabapentin (NEURONTIN) 400 MG capsule TAKE ONE CAPSULE BY MOUTH 4 TIMES A DAY 120 capsule 5  . glipiZIDE (GLUCOTROL XL) 5 MG 24 hr tablet Take 1 tablet (5 mg total) by mouth daily with breakfast. 90 tablet 1  . glucose blood (ONE TOUCH ULTRA TEST) test strip Use 2x a day 200 each 11  . Lancets (ONETOUCH ULTRASOFT) lancets Use as instructed 100 each 3  . metFORMIN (GLUCOPHAGE) 500 MG tablet TAKE 1 TABLET BY MOUTH 2 TIMES DAILY WITH A MEAL. 180 tablet 1  . naproxen sodium (ANAPROX) 220 MG tablet Take 220 mg by mouth 2 (two) times daily as needed. For pain    . Tamsulosin HCl (FLOMAX) 0.4 MG CAPS Take 0.8 mg by mouth at bedtime.     . Tiotropium  Bromide-Olodaterol (STIOLTO RESPIMAT) 2.5-2.5 MCG/ACT AERS Inhale 2 puffs into the lungs daily. 4 g 6  . traMADol (ULTRAM) 50 MG tablet TAKE 1 TABLET BY MOUTH EVERY 6 HOURS AS NEEDED FOR PAIN 180 tablet 0  . Umeclidinium-Vilanterol 62.5-25 MCG/INH AEPB Inhale 1 puff into the lungs daily. 60 each 11  . [DISCONTINUED] hydrochlorothiazide (MICROZIDE) 12.5 MG capsule Take 1 capsule (12.5 mg total) by mouth daily. 30 capsule 0   No current facility-administered medications on file prior to visit.    Allergies  Allergen Reactions  . Sudafed [Pseudoephedrine Hcl]     "makes me feel drunk"    Past Medical History  Diagnosis Date  . Diabetes mellitus     boarderline  . BPH (benign prostatic hypertrophy)   . Neuropathy ([place])   . OSA on CPAP     cpap- doe snot know settings   . Arthritis   . Sinus infection     dx 12/13   . Blister of foot or toe     left ssecond toe healing per note of 09/21/12   . Heart murmur     noted on visit of 09/20/12- [facility]     Past Surgical History  Procedure Laterality Date  . Cholecystectomy    . Laparotomy  12/23/2011    Procedure: EXPLORATORY LAPAROTOMY;  Surgeon: Odis Hollingshead, MD;  Location: [facility];  Service: General;  Laterality: N/A;  . Appendectomy  12/23/2011    Procedure: APPENDECTOMY;  Surgeon: Odis Hollingshead, MD;  Location: [facility];  Service: General;  Laterality: N/A;  . Colostomy revision  12/23/2011    Procedure: COLON RESECTION SIGMOID;  Surgeon: Odis Hollingshead, MD;  Location: [facility];  Service: General;  Laterality: N/AJeanette Caprice procedure    . Cardiac catheterization      [facility]  . Bilateral hand surgery       metal placed in hands due to trauma in 2011   . Right shoulder surgery       related to trauma   . Colostomy takedown  09/27/2012    Procedure: LAPAROSCOPIC COLOSTOMY TAKEDOWN;  Surgeon: Odis Hollingshead, MD;  Location: [facility];  Service: General;  Laterality: N/A;  Laparoscopic Colostomy Takedown  . Incise and drain abcess       sebaceous cyst lanced in office on lt shoulder    Family History  Problem Relation Age of Onset  . Diabetes Mother   . Cancer Mother     skin  . Heart disease Mother   . Heart attack Mother   . Cancer Father     Leukemia  . Colon cancer Neg Hx     Social History   Social History  . Marital Status: Married    Spouse Name: N/A  . Number of Children: N/A  . Years of Education: N/A   Occupational History  . Futures trader    Social History Main Topics  . Smoking status: Former Smoker -- 2.00 packs/day for 40 years    Types: Cigarettes    Quit date: 10/10/1993  . Smokeless tobacco: Former Systems developer  . Alcohol Use: No  . Drug Use: No  . Sexual Activity: Not on file   Other Topics Concern  . Not on file   Social History Narrative       Review of Systems  Constitutional: Positive for fever.  Gastrointestinal: Negative.   Skin: Positive for wound.  Neurological: Negative.   Psychiatric/Behavioral: Negative.   All other systems reviewed and are negative.      Objective:    BP 142/72 mmHg  Pulse 86  Temp(Src) 97.3 F (36.3 C) (Oral)  Wt 253 lb 4 oz (114.873 kg)  SpO2 98%   Physical Exam  Constitutional: He is oriented to person, place, and time. He appears well-developed and well-nourished. No distress.  HENT:  Head: Normocephalic.  Eyes: Conjunctivae are normal.  Cardiovascular: Normal rate.   Pulmonary/Chest: Effort normal.  Neurological: He is alert and oriented to person, place, and time. No cranial nerve deficit.  Skin: Skin is warm and dry.     Psychiatric: He has a normal mood and affect. His behavior is normal. Judgment and thought content normal.  Nursing note and vitals reviewed.         Assessment & Plan:   Diabetic ulcer of left foot associated with type 2 diabetes mellitus ([place]) - Plan: AMB referral to wound care center No Follow-up on file.

## 2015-09-22 NOTE — Patient Instructions (Signed)
Great to see you- Take doxycyline as directed- 1 tablet twice daily for 10 days. Stop by to see Rosaria Ferries or Vaughan Basta on your way out to set up your wound center appointment.

## 2015-09-22 NOTE — Assessment & Plan Note (Signed)
New- cleaned and dress in hydrogel dressing.  Doxycyline 100 mg twice daily x 10 days, refer to urgently to wound center for possible debridement. The patient indicates understanding of these issues and agrees with the plan.

## 2015-09-23 ENCOUNTER — Ambulatory Visit: Payer: Medicare Other | Admitting: Family Medicine

## 2015-09-23 ENCOUNTER — Other Ambulatory Visit: Payer: Self-pay | Admitting: Surgery

## 2015-09-23 ENCOUNTER — Ambulatory Visit
Admission: RE | Admit: 2015-09-23 | Discharge: 2015-09-23 | Disposition: A | Discharge status: Home / Self Care | Payer: Medicare Other | Source: Ambulatory Visit | Attending: Surgery | Admitting: Surgery

## 2015-09-23 ENCOUNTER — Encounter: Payer: Medicare Other | Attending: Surgery | Admitting: Surgery

## 2015-09-23 DIAGNOSIS — E1140 Type 2 diabetes mellitus with diabetic neuropathy, unspecified: Secondary | ICD-10-CM | POA: Diagnosis not present

## 2015-09-23 DIAGNOSIS — E11621 Type 2 diabetes mellitus with foot ulcer: Secondary | ICD-10-CM | POA: Diagnosis not present

## 2015-09-23 DIAGNOSIS — X58XXXA Exposure to other specified factors, initial encounter: Secondary | ICD-10-CM | POA: Diagnosis not present

## 2015-09-23 DIAGNOSIS — L03032 Cellulitis of left toe: Secondary | ICD-10-CM | POA: Insufficient documentation

## 2015-09-23 DIAGNOSIS — E119 Type 2 diabetes mellitus without complications: Secondary | ICD-10-CM | POA: Insufficient documentation

## 2015-09-23 DIAGNOSIS — M1990 Unspecified osteoarthritis, unspecified site: Secondary | ICD-10-CM | POA: Diagnosis not present

## 2015-09-23 DIAGNOSIS — S81802A Unspecified open wound, left lower leg, initial encounter: Secondary | ICD-10-CM | POA: Insufficient documentation

## 2015-09-23 DIAGNOSIS — S91102A Unspecified open wound of left great toe without damage to nail, initial encounter: Secondary | ICD-10-CM | POA: Insufficient documentation

## 2015-09-23 DIAGNOSIS — G4730 Sleep apnea, unspecified: Secondary | ICD-10-CM | POA: Insufficient documentation

## 2015-09-23 DIAGNOSIS — S91105A Unspecified open wound of left lesser toe(s) without damage to nail, initial encounter: Secondary | ICD-10-CM | POA: Diagnosis not present

## 2015-09-23 DIAGNOSIS — M7732 Calcaneal spur, left foot: Secondary | ICD-10-CM | POA: Insufficient documentation

## 2015-09-23 DIAGNOSIS — Z87891 Personal history of nicotine dependence: Secondary | ICD-10-CM | POA: Insufficient documentation

## 2015-09-23 DIAGNOSIS — N400 Enlarged prostate without lower urinary tract symptoms: Secondary | ICD-10-CM | POA: Diagnosis not present

## 2015-09-23 DIAGNOSIS — S91302A Unspecified open wound, left foot, initial encounter: Secondary | ICD-10-CM | POA: Diagnosis not present

## 2015-09-23 DIAGNOSIS — R600 Localized edema: Secondary | ICD-10-CM | POA: Diagnosis not present

## 2015-09-24 ENCOUNTER — Telehealth: Payer: Self-pay | Admitting: Family Medicine

## 2015-09-24 NOTE — Telephone Encounter (Signed)
Oh I forgot about that.  Thanks so much.

## 2015-09-24 NOTE — Telephone Encounter (Signed)
Pts spouse called to let Dr. Deborra Medina know that the doctor they seen yesterday at the [facility] recommended diabetic shoes for pt. Mrs. Sisak would like to know do they need a appointment for Dr. Deborra Medina to help prescribe this or can she just write a RX?  CB#[phone]

## 2015-09-24 NOTE — Progress Notes (Signed)
Nicholas [name], [name] ([id number]) Visit Report for 09/23/2015 Allergy List Details Patient Name: Nicholas [name], [name] Date of Service: 09/23/2015 8:00 AM Medical Record Number: [id number] Patient Account Number: [id number] Date of Birth/Sex: [birth year] (74 y.o. Male) Treating RN: Nicholas [name] Primary Care Physician: Nicholas [name] Other Clinician: Referring Physician: Arnette [name] Treating Physician/Extender: BURNS [name], Nicholas Weeks in Treatment: 0 Allergies Active Allergies Sudafed Allergy Notes Electronic Signature(s) Signed: 09/23/2015 5:20:32 PM By: Nicholas [name] Entered By: Nicholas [name] on 09/23/2015 08:19:35 Nicholas [name], [name] ([id number]) -------------------------------------------------------------------------------- Arrival Information Details Patient Name: Nicholas [name], [name] Date of Service: 09/23/2015 8:00 AM Medical Record Number: [id number] Patient Account Number: [id number] Date of Birth/Sex: 04-Mar-1941 (74 y.o. Male) Treating RN: Nicholas [name] Primary Care Physician: Nicholas [name] Other Clinician: Referring Physician: Arnette [name] Treating Physician/Extender: BURNS [name], Nicholas [name] in Treatment: 0 Visit Information Patient Arrived: Ambulatory Arrival Time: 08:15 Accompanied By: self Transfer Assistance: None Patient Identification Verified: Yes Secondary Verification Process Yes Completed: Patient Has Alerts: Yes Patient Alerts: DMII Electronic Signature(s) Signed: 09/23/2015 5:20:32 PM By: Nicholas [name] Entered By: Nicholas [name] on 09/23/2015 08:15:38 [name], [name] ([id number]) -------------------------------------------------------------------------------- Clinic Level of Care Assessment Details Patient Name: Nicholas [name], [name] Date of Service: 09/23/2015 8:00 AM Medical Record Number: [id number] Patient Account Number: [id number] Date of Birth/Sex: [birth year] (74 y.o. Male) Treating RN: Nicholas [name] Primary Care Physician: Nicholas [name] Other Clinician: Referring  Physician: Arnette [name] Treating Physician/Extender: BURNS [name], Nicholas [name] in Treatment: 0 Clinic Level of Care Assessment Items TOOL 1 Quantity Score []  - Use when EandM and Procedure is performed on INITIAL visit 0 ASSESSMENTS - Nursing Assessment / Reassessment X - General Physical Exam (combine w/ comprehensive assessment (listed just 1 20 below) when performed on new pt. evals) X - Comprehensive Assessment (HX, ROS, Risk Assessments, Wounds Hx, etc.) 1 25 ASSESSMENTS - Wound and Skin Assessment / Reassessment []  - Dermatologic / Skin Assessment (not related to wound area) 0 ASSESSMENTS - Ostomy and/or Continence Assessment and Care []  - Incontinence Assessment and Management 0 []  - Ostomy Care Assessment and Management (repouching, etc.) 0 PROCESS - Coordination of Care X - Simple Patient / Family Education for ongoing care 1 15 []  - Complex (extensive) Patient / Family Education for ongoing care 0 X - Staff obtains Programmer, systems, Records, Test Results / Process Orders 1 10 []  - Staff telephones HHA, Nursing Homes / Clarify orders / etc 0 []  - Routine Transfer to another Facility (non-emergent condition) 0 []  - Routine Hospital Admission (non-emergent condition) 0 X - New Admissions / Biomedical engineer / Ordering NPWT, Apligraf, etc. 1 15 []  - Emergency Hospital Admission (emergent condition) 0 PROCESS - Special Needs []  - Pediatric / Minor Patient Management 0 []  - Isolation Patient Management 0 [name], [name] ([id number]) []  - Hearing / Language / Visual special needs 0 []  - Assessment of Community assistance (transportation, D/C planning, etc.) 0 []  - Additional assistance / Altered mentation 0 []  - Support Surface(s) Assessment (bed, cushion, seat, etc.) 0 INTERVENTIONS - Miscellaneous []  - External ear exam 0 []  - Patient Transfer (multiple staff / Civil Service fast streamer / Similar devices) 0 []  - Simple Staple / Suture removal (25 or less) 0 []  - Complex Staple / Suture removal  (26 or more) 0 []  - Hypo/Hyperglycemic Management (do not check if billed separately) 0 X - Ankle / Brachial Index (ABI) - do not check if billed separately 1 15 Has the patient been seen at the hospital within the last three years: Yes Total Score:  100 Level Of Care: New/Established - Level 3 Electronic Signature(s) Signed: 09/23/2015 5:20:32 PM By: Nicholas [name] Entered By: Nicholas [name] on 09/23/2015 09:24:51 Nicholas [name], [name] ([id number]) -------------------------------------------------------------------------------- Encounter Discharge Information Details Patient Name: [name], [name] Date of Service: 09/23/2015 8:00 AM Medical Record Number: [id number] Patient Account Number: [id number] Date of Birth/Sex: 1941/09/24 (74 y.o. Male) Treating RN: Nicholas [name] Primary Care Physician: Nicholas [name] Other Clinician: Referring Physician: Arnette [name] Treating Physician/Extender: BURNS [name], Nicholas [name] in Treatment: 0 Encounter Discharge Information Items Discharge Pain Level: 0 Discharge Condition: Stable Ambulatory Status: Ambulatory Discharge Destination: Home Transportation: Private Auto Accompanied By: self Schedule Follow-up Appointment: Yes Medication Reconciliation completed and provided to Patient/Care No Nicholas [name]: Provided on Clinical Summary of Care: 09/23/2015 Form Type Recipient Paper Patient [facility] Electronic Signature(s) Signed: 09/23/2015 5:20:32 PM By: Nicholas [name] Previous Signature: 09/23/2015 9:17:53 AM Version By: Nicholas [name] Entered By: Nicholas [name] on 09/23/2015 09:25:28 Nicholas [name], [name] ([id number]) -------------------------------------------------------------------------------- Lower Extremity Assessment Details Patient Name: Nicholas [name], [name] Date of Service: 09/23/2015 8:00 AM Medical Record Number: [id number] Patient Account Number: [id number] Date of Birth/Sex: 12-Jan-1941 (74 y.o. Male) Treating RN: Nicholas [name] Primary Care Physician: Nicholas [name] Other Clinician: Referring Physician: Arnette [name] Treating Physician/Extender: BURNS [name], Nicholas [name] in Treatment: 0 Edema Assessment Assessed: [Left: No] [Right: No] Edema: [Left: Yes] [Right: Yes] Calf Left: Right: Point of Measurement: 36 cm From Medial Instep 41 cm 41.3 cm Ankle Left: Right: Point of Measurement: 11 cm From Medial Instep 26.2 cm 26.3 cm Vascular Assessment Pulses: Posterior Tibial Palpable: [Left:Yes] [Right:Yes] Doppler: [Left:Monophasic] [Right:Monophasic] Dorsalis Pedis Palpable: [Left:Yes] [Right:Yes] Doppler: [Left:Monophasic] [Right:Monophasic] Extremity colors, hair growth, and conditions: Extremity Color: [Left:Pale] [Right:Pale] Hair Growth on Extremity: [Left:Yes] [Right:Yes] Temperature of Extremity: [Left:Cool] [Right:Cool] Capillary Refill: [Left:< 3 seconds] [Right:< 3 seconds] Blood Pressure: Brachial: [Left:134] [Right:138] Dorsalis Pedis: 134 [Left:Dorsalis Pedis: [id number] Ankle: Posterior Tibial: 140 [Left:Posterior Tibial: 142 1.01] [Right:1.03] Toe Nail Assessment Left: Right: Thick: Yes Yes Discolored: Yes Yes Deformed: No No Improper Length and Hygiene: No No Nicholas [name], [name] ([id number]) Electronic Signature(s) Signed: 09/23/2015 5:20:32 PM By: Nicholas [name] Entered By: Nicholas [name] on 09/23/2015 08:49:52 Nicholas [name], [name] ([id number]) -------------------------------------------------------------------------------- Multi Wound Chart Details Patient Name: Nicholas [name], [name] Date of Service: 09/23/2015 8:00 AM Medical Record Number: [id number] Patient Account Number: [id number] Date of Birth/Sex: [birth year] (74 y.o. 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] [2:the Lower Extremity] Comorbid History: [1:Type II Diabetes, Osteoarthritis, Neuropathy Osteoarthritis, Neuropathy] [2:Type II Diabetes,] [N/A:N/A] Date Acquired: [1:09/16/2015] [2:09/16/2015] [N/A:N/A] Weeks of Treatment: [1:0] [2:0] [N/A:N/A] Wound Status: [1:Open] [2:Open] [N/A:N/A] Measurements L x W x D 0.7x1x0.1 [2:1x1.2x0.1] [N/A:N/A] (cm) Area (cm) : [1:0.55] [2:0.942] [N/A:N/A] Volume (cm) : [1:0.055] [2:0.094] [N/A:N/A] % Reduction in Area: [1:0.00%] [2:N/A] [N/A:N/A] % Reduction in Volume: 0.00% [2:N/A] [N/A:N/A] Classification: [1:Grade 2] [2:Grade 2] [N/A:N/A] Exudate Amount: [1:Medium] [2:Medium] [N/A:N/A] Exudate Type: [1:Serous] [2:Serous] [N/A:N/A] Exudate Color: [1:amber] [2:amber] [N/A:N/A] Wound Margin: [1:Flat and Intact] [2:Flat and Intact] [N/A:N/A] Granulation Amount: [1:Small (1-33%)] [2:Small (1-33%)] [N/A:N/A] Granulation Quality: [1:Red] [2:Red] [N/A:N/A] Necrotic Amount: [1:Large (67-100%)] [2:Large (  67-100%)] [N/A:N/A] Necrotic Tissue: [1:Eschar, Adherent [place] N/A] Exposed Structures: [1:Fascia: No Fat: No Tendon: No Muscle: No Joint: No] [2:Fascia: No Fat: No Tendon: No Muscle: No Joint: No] [N/A:N/A] Bone: No Bone: No Limited to Skin Limited to Skin Breakdown Breakdown Epithelialization: None None N/A Periwound Skin Texture: Edema: Yes Edema: Yes N/A Excoriation: No Excoriation: No Induration: No Induration: No Callus: No Callus: No Crepitus: No Crepitus: No Fluctuance: No Fluctuance: No Friable: No Friable: No Rash: No Rash: No Scarring: No Scarring: No Periwound Skin Maceration:  No Maceration: No N/A Moisture: Moist: No Moist: No Dry/Scaly: No Dry/Scaly: No Periwound Skin Color: Erythema: Yes Erythema: Yes N/A Atrophie Blanche: No Atrophie Blanche: No Cyanosis: No Cyanosis: No Ecchymosis: No Ecchymosis: No Hemosiderin Staining: No Hemosiderin Staining: No Mottled: No Mottled: No Pallor: No Pallor: No Rubor: No Rubor: No Erythema Location: Circumferential Circumferential N/A Temperature: No Abnormality No Abnormality N/A Tenderness on Yes Yes N/A Palpation: Wound Preparation: Ulcer Cleansing: Ulcer Cleansing: N/A Rinsed/Irrigated with Rinsed/Irrigated with Saline Saline Topical Anesthetic Topical Anesthetic Applied: Other: lidocaine Applied: Other: lidocaine 4% 4% Treatment Notes Electronic Signature(s) Signed: 09/23/2015 5:20:32 PM By: Nicholas [name] Entered By: Nicholas [name] on 09/23/2015 09:00:43 Nicholas [name], [name] ([id number]) -------------------------------------------------------------------------------- [place] Details Patient Name: Nicholas [name], [name] Date of Service: 09/23/2015 8:00 AM Medical Record Number: [id number] Patient Account Number: [id number] Date of Birth/Sex: 12-03-1940 (74 y.o. Male) Treating RN: Nicholas [name] Primary Care Physician: Nicholas [name] Other Clinician: Referring Physician: Arnette [name] Treating Physician/Extender: BURNS [name], Nicholas [name] in Treatment: 0 Active Inactive Orientation to the Wound Care Program Nursing Diagnoses: Knowledge deficit related to the wound healing center program Goals: Patient/caregiver will verbalize understanding of the [facility] Date Initiated: 09/23/2015 Goal Status: Active Interventions: Provide education on orientation to the wound center Notes: Wound/Skin Impairment Nursing Diagnoses: Impaired tissue integrity Knowledge deficit related to ulceration/compromised skin integrity Goals: Patient/caregiver will verbalize understanding of  skin care regimen Date Initiated: 09/23/2015 Goal Status: Active Ulcer/skin breakdown will have a volume reduction of 30% by week 4 Date Initiated: 09/23/2015 Goal Status: Active Ulcer/skin breakdown will have a volume reduction of 50% by week 8 Date Initiated: 09/23/2015 Goal Status: Active Ulcer/skin breakdown will have a volume reduction of 80% by week 12 Date Initiated: 09/23/2015 Goal Status: Active Ulcer/skin breakdown will heal within 14 weeks Date Initiated: 09/23/2015 Nicholas [name], Braxdon L. ([id number]) Goal Status: Active Interventions: Assess patient/caregiver ability to perform ulcer/skin care regimen upon admission and as needed Assess ulceration(s) every visit Notes: Electronic Signature(s) Signed: 09/23/2015 5:20:32 PM By: Nicholas [name] Entered By: Nicholas [name] on 09/23/2015 09:00:32 Seefeldt, Amrom L. ([id number]) -------------------------------------------------------------------------------- Patient/Caregiver Education Details Patient Name: Roundy, Kross L. Date of Service: 09/23/2015 8:00 AM Medical Record Number: [id number] Patient Account Number: [id number] Date of Birth/Gender: 1941/05/16 (74 y.o. Male) Treating RN: Nicholas [name] Primary Care Physician: Nicholas [name] Other Clinician: Referring Physician: Arnette [name] Treating Physician/Extender: BURNS [name], Nicholas [name] in Treatment: 0 Education Assessment Education Provided To: Patient Education Topics Provided Wound/Skin Impairment: Handouts: Other: wound care as ordered Methods: Demonstration, Explain/Verbal Responses: State content correctly Electronic Signature(s) Signed: 09/23/2015 5:20:32 PM By: Nicholas [name] Entered By: Nicholas [name] on 09/23/2015 09:25:43 Chojnowski, Keaghan L. ([id number]) -------------------------------------------------------------------------------- Wound Assessment Details Patient Name: Carrasco, Jozsef L. Date of Service: 09/23/2015 8:00 AM Medical Record Number: [id number] Patient Account  Number: [id number] Date of Birth/Sex: [birth year] (74 y.o. Male) Treating RN: Nicholas [name] Primary Care Physician: Nicholas [name] Other Clinician: Referring Physician: Arnette [name] Treating Physician/Extender: BURNS [name],  Nicholas Weeks in Treatment: 0 Wound Status Wound Number: 1 Primary Diabetic Wound/Ulcer of the Lower Etiology: Extremity Wound Location: Left Toe Great - Plantar Wound Status: Open Wounding Event: Blister Comorbid Type II Diabetes, Osteoarthritis, Date Acquired: 09/16/2015 History: Neuropathy Weeks Of Treatment: 0 Clustered Wound: No Photos Photo Uploaded By: Nicholas [name] on 09/23/2015 10:22:16 Wound Measurements Length: (cm) 0.7 Width: (cm) 1 Depth: (cm) 0.1 Area: (cm) 0.55 Volume: (cm) 0.055 % Reduction in Area: 0% % Reduction in Volume: 0% Epithelialization: None Tunneling: No Undermining: No Wound Description Classification: Grade 2 Wound Margin: Flat and Intact Exudate Amount: Medium Exudate Type: Serous Exudate Color: amber Foul Odor After Cleansing: No Wound Bed Granulation Amount: Small (1-33%) Exposed Structure Granulation Quality: Red Fascia Exposed: No Necrotic Amount: Large (67-100%) Fat Layer Exposed: No Necrotic Quality: Eschar, Adherent Slough Tendon Exposed: No Shank, Christoher L. ([id number]) Muscle Exposed: No Joint Exposed: No Bone Exposed: No Limited to Skin Breakdown Periwound Skin Texture Texture Color No Abnormalities Noted: No No Abnormalities Noted: No Callus: No Atrophie Blanche: No Crepitus: No Cyanosis: No Excoriation: No Ecchymosis: No Fluctuance: No Erythema: Yes Friable: No Erythema Location: Circumferential Induration: No Hemosiderin Staining: No Localized Edema: Yes Mottled: No Rash: No Pallor: No Scarring: No Rubor: No Moisture Temperature / Pain No Abnormalities Noted: No Temperature: No Abnormality Dry / Scaly: No Tenderness on Palpation: Yes Maceration: No Moist: No Wound Preparation Ulcer  Cleansing: Rinsed/Irrigated with Saline Topical Anesthetic Applied: Other: lidocaine 4%, Treatment Notes Wound #1 (Left, Plantar Toe Great) 1. Cleansed with: Clean wound with Normal Saline 2. Anesthetic Topical Lidocaine 4% cream to wound bed prior to debridement 4. Dressing Applied: Aquacel Ag 5. Secondary Dressing Applied Gauze and Kerlix/Conform 7. Secured with Recruitment consultant) Signed: 09/23/2015 5:20:32 PM By: Nicholas [name] Entered By: Nicholas [name] on 09/23/2015 08:41:27 Patman, Jayston L. ([id number]) -------------------------------------------------------------------------------- Wound Assessment Details Patient Name: Zingg, Amaro L. Date of Service: 09/23/2015 8:00 AM Medical Record Number: [id number] Patient Account Number: [id number] Date of Birth/Sex: 06/25/1941 (74 y.o. Male) Treating RN: Nicholas [name] Primary Care Physician: Nicholas [name] Other Clinician: Referring Physician: Arnette [name] Treating Physician/Extender: BURNS [name], Nicholas [name] in Treatment: 0 Wound Status Wound Number: 2 Primary Diabetic Wound/Ulcer of the Lower Etiology: Extremity Wound Location: Left Toe Second - Plantar Wound Status: Open Wounding Event: Blister Comorbid Type II Diabetes, Osteoarthritis, Date Acquired: 09/16/2015 History: Neuropathy Weeks Of Treatment: 0 Clustered Wound: No Photos Photo Uploaded By: Nicholas [name] on 09/23/2015 10:22:16 Wound Measurements Length: (cm) 1 Width: (cm) 1.2 Depth: (cm) 0.1 Area: (cm) 0.942 Volume: (cm) 0.094 % Reduction in Area: % Reduction in Volume: Epithelialization: None Tunneling: No Undermining: No Wound Description Classification: Grade 2 Wound Margin: Flat and Intact Exudate Amount: Medium Exudate Type: Serous Exudate Color: amber Foul Odor After Cleansing: No Wound Bed Granulation Amount: Small (1-33%) Exposed Structure Granulation Quality: Red Fascia Exposed: No Necrotic Amount: Large (67-100%) Fat Layer  Exposed: No Necrotic Quality: Eschar, Adherent Slough Tendon Exposed: No Leyda, Zakary L. ([id number]) Muscle Exposed: No Joint Exposed: No Bone Exposed: No Limited to Skin Breakdown Periwound Skin Texture Texture Color No Abnormalities Noted: No No Abnormalities Noted: No Callus: No Atrophie Blanche: No Crepitus: No Cyanosis: No Excoriation: No Ecchymosis: No Fluctuance: No Erythema: Yes Friable: No Erythema Location: Circumferential Induration: No Hemosiderin Staining: No Localized Edema: Yes Mottled: No Rash: No Pallor: No Scarring: No Rubor: No Moisture Temperature / Pain No Abnormalities Noted: No Temperature: No Abnormality Dry / Scaly: No Tenderness on Palpation: Yes Maceration: No Moist: No  Wound Preparation Ulcer Cleansing: Rinsed/Irrigated with Saline Topical Anesthetic Applied: Other: lidocaine 4%, Treatment Notes Wound #2 (Left, Plantar Toe Second) 1. Cleansed with: Clean wound with Normal Saline 2. Anesthetic Topical Lidocaine 4% cream to wound bed prior to debridement 4. Dressing Applied: Aquacel Ag 5. Secondary Dressing Applied Gauze and Kerlix/Conform 7. Secured with Recruitment consultant) Signed: 09/23/2015 5:20:32 PM By: Nicholas [name] Entered By: Nicholas [name] on 09/23/2015 08:41:10 Salaz, Berle L. ([id number]) -------------------------------------------------------------------------------- Vitals Details Patient Name: Mo, Ramello L. Date of Service: 09/23/2015 8:00 AM Medical Record Number: [id number] Patient Account Number: [id number] Date of Birth/Sex: [birth year] (74 y.o. Male) Treating RN: Nicholas [name] Primary Care Physician: Nicholas [name] Other Clinician: Referring Physician: Arnette [name] Treating Physician/Extender: BURNS [name], Nicholas [name] in Treatment: 0 Vital Signs Time Taken: 08:16 Temperature (F): 98.2 Height (in): 72 Pulse (bpm): 76 Source: Stated Respiratory Rate (breaths/min): 18 Weight (lbs): 250 Blood  Pressure (mmHg): 145/78 Source: Measured Reference Range: 80 - 120 mg / dl Body Mass Index (BMI): 33.9 Electronic Signature(s) Signed: 09/23/2015 5:20:32 PM By: Nicholas [name] Entered By: Nicholas [name] on 09/23/2015 08:19:18

## 2015-09-24 NOTE — Telephone Encounter (Signed)
I am not allowed to right RX for diabetic shoes, it has to be done by an MD.  I will see if someone else can do it in your absence though.

## 2015-09-24 NOTE — Progress Notes (Signed)
Dues, Keghan L. ([id number]) Visit Report for 09/23/2015 Chief Complaint Document Details Patient Name: [name], [name] Date of Service: 09/23/2015 8:00 AM Medical Record Number: [id number] Patient Account Number: [id number] Date of Birth/Sex: 1941/08/14 (74 y.o. Male) Treating RN: Montey Hora Primary Care Physician: Arnette Norris Other Clinician: Referring Physician: Arnette Norris Treating Physician/Extender: BURNS III, Charlean Sanfilippo in Treatment: 0 Information Obtained from: Patient Chief Complaint Left toe ulcerations. Electronic Signature(s) Signed: 09/23/2015 3:49:04 PM By: Loletha Grayer MD Entered By: Loletha Grayer on 09/23/2015 15:00:20 [name], [name] ([id number]) -------------------------------------------------------------------------------- Debridement Details Patient Name: [name], [name] Date of Service: 09/23/2015 8:00 AM Medical Record Number: [id number] Patient Account Number: [id number] Date of Birth/Sex: 1941/07/06 (74 y.o. Male) Treating RN: Montey Hora Primary Care Physician: Arnette Norris Other Clinician: Referring Physician: Arnette Norris Treating Physician/Extender: BURNS III, Charlean Sanfilippo in Treatment: 0 Debridement Performed for Wound #1 Left,Plantar Toe Great Assessment: Performed By: Physician BURNS III, Teressa Senter., MD Debridement: Debridement Pre-procedure Yes Verification/Time Out Taken: Start Time: 08:58 Pain Control: Lidocaine 4% Topical Solution Level: Skin/Subcutaneous Tissue Total Area Debrided (L x 0.7 (cm) x 1 (cm) = 0.7 (cm) W): Tissue and other Viable, Non-Viable, Fat, Fibrin/Slough, Subcutaneous material debrided: Instrument: Curette Bleeding: Minimum Hemostasis Achieved: Pressure End Time: 09:00 Procedural Pain: 0 Post Procedural Pain: 0 Response to Treatment: Procedure was tolerated well Post Debridement Measurements of Total Wound Length: (cm) 0.7 Width: (cm) 1 Depth: (cm) 0.2 Volume: (cm) 0.11 Post Procedure  Diagnosis Same as Pre-procedure Electronic Signature(s) Signed: 09/23/2015 3:49:04 PM By: Loletha Grayer MD Signed: 09/23/2015 5:20:32 PM By: Montey Hora Entered By: Loletha Grayer on 09/23/2015 14:59:38 Harcum, [name] ([id number]) -------------------------------------------------------------------------------- Debridement Details Patient Name: [name], [name] Date of Service: 09/23/2015 8:00 AM Medical Record Number: [id number] Patient Account Number: [id number] Date of Birth/Sex: 12-18-1940 (74 y.o. Male) Treating RN: Montey Hora Primary Care Physician: Arnette Norris Other Clinician: Referring Physician: Arnette Norris Treating Physician/Extender: BURNS III, Charlean Sanfilippo in Treatment: 0 Debridement Performed for Wound #2 Left,Plantar Toe Second Assessment: Performed By: Physician BURNS III, Teressa Senter., MD Debridement: Debridement Pre-procedure Yes Verification/Time Out Taken: Start Time: 09:00 Pain Control: Lidocaine 4% Topical Solution Level: Skin/Subcutaneous Tissue Total Area Debrided (L x 1 (cm) x 1.2 (cm) = 1.2 (cm) W): Tissue and other Viable, Non-Viable, Fat, Fibrin/Slough, Subcutaneous material debrided: Instrument: Curette Bleeding: Minimum Hemostasis Achieved: Pressure End Time: 09:01 Procedural Pain: 0 Post Procedural Pain: 0 Response to Treatment: Procedure was tolerated well Post Debridement Measurements of Total Wound Length: (cm) 1 Width: (cm) 1.2 Depth: (cm) 0.2 Volume: (cm) 0.188 Post Procedure Diagnosis Same as Pre-procedure Notes Biopsy culture obtained. Electronic Signature(s) Signed: 09/23/2015 3:49:04 PM By: Loletha Grayer MD Signed: 09/23/2015 5:20:32 PM By: Montey Hora Entered By: Loletha Grayer on 09/23/2015 15:00:02 [name], [name] ([id number]) [name], [name] ([id number]) -------------------------------------------------------------------------------- HPI Details Patient Name: [name], [name] Date of Service:  09/23/2015 8:00 AM Medical Record Number: [id number] Patient Account Number: [id number] Date of Birth/Sex: Feb 18, 1941 (74 y.o. Male) Treating RN: Montey Hora Primary Care Physician: Arnette Norris Other Clinician: Referring Physician: Arnette Norris Treating Physician/Extender: BURNS III, Charlean Sanfilippo in Treatment: 0 History of Present Illness HPI Description: Pleasant 74 year old with history of diabetes (hemoglobin A1c reportedly 6), peripheral neuropathy, and sleep apnea. No history of PAD. Left ABI 1.01. He developed blisters and subsequent ulcerations on the plantar aspect of his left first and second toe in early December 2016. Denies any trauma, unusual activity, or new footwear. Has been fitted for  orthotics in the past but did not like them. He is ambulating per his baseline without difficulty. No claudication or rest pain. 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 Started on doxycycline yesterday. Not performing dressing changes. No significant pain. No fever or chills. Minimal drainage. Electronic Signature(s) Signed: 09/23/2015 3:49:04 PM By: Loletha Grayer MD Entered By: Loletha Grayer on 09/23/2015 15:02:45 [name], [name] ([id number]) -------------------------------------------------------------------------------- Physical Exam Details Patient Name: [name], [name] Date of Service: 09/23/2015 8:00 AM Medical Record Number: [id number] Patient Account Number: [id number] Date of Birth/Sex: 08-29-1941 (74 y.o. Male) Treating RN: Montey Hora Primary Care Physician: Arnette Norris Other Clinician: Referring Physician: Arnette Norris Treating Physician/Extender: BURNS III, Jasiri Hanawalt Weeks in Treatment: 0 Constitutional . Pulse regular. Respirations normal and unlabored. Afebrile. Marland Kitchen Respiratory WNL. No retractions.. Cardiovascular Pedal Pulses WNL. Integumentary (Hair, Skin) .Marland Kitchen Neurological . Psychiatric Judgement and insight Intact.. Oriented times 3.. No  evidence of depression, anxiety, or agitation.. Notes Left first and second toe plantar ulcerations. Full-thickness. No exposed deep structures. Cellulitis involving the entirety of the left second toe. Biopsy culture obtained. Palpable DP. Left ABI 1.01. Electronic Signature(s) Signed: 09/23/2015 3:49:04 PM By: Loletha Grayer MD Entered By: Loletha Grayer on 09/23/2015 15:03:43 [name], [name] ([id number]) -------------------------------------------------------------------------------- Physician Orders Details Patient Name: Detjen, Celia L. Date of Service: 09/23/2015 8:00 AM Medical Record Number: [id number] Patient Account Number: [id number] Date of Birth/Sex: Feb 03, 1941 (74 y.o. Male) Treating RN: Montey Hora Primary Care Physician: Arnette Norris Other Clinician: Referring Physician: Arnette Norris Treating Physician/Extender: BURNS III, Charlean Sanfilippo in Treatment: 0 Verbal / Phone Orders: Yes Clinician: Montey Hora Read Back and Verified: Yes Diagnosis Coding Wound Cleansing Wound #1 Left,Plantar Toe Great o Clean wound with Normal Saline. Wound #2 Left,Plantar Toe Second o Clean wound with Normal Saline. Anesthetic Wound #1 Left,Plantar Toe Great o Topical Lidocaine 4% cream applied to wound bed prior to debridement Wound #2 Left,Plantar Toe Second o Topical Lidocaine 4% cream applied to wound bed prior to debridement Primary Wound Dressing Wound #1 Left,Plantar Toe Great o Aquacel Ag Wound #2 Left,Plantar Toe Second o Aquacel Ag Secondary Dressing Wound #1 Left,Plantar Toe Great o Gauze and Kerlix/Conform Wound #2 Left,Plantar Toe Second o Gauze and Kerlix/Conform Dressing Change Frequency Wound #1 Left,Plantar Toe Great o Change dressing every day. Wound #2 Left,Plantar Toe Second o Change dressing every day. Follow-up Appointments Marcellus, Zeus L. ([id number]) Wound #1 Left,Plantar Toe Great o Return Appointment in 1 week. Wound #2  Left,Plantar Toe Second o Return Appointment in 1 week. Laboratory o Culture and Sensitivity - left 2nd toe oooo Radiology o X-ray, foot - left oooo Electronic Signature(s) Signed: 09/23/2015 3:49:04 PM By: Loletha Grayer MD Signed: 09/23/2015 5:20:32 PM By: Montey Hora Entered By: Montey Hora on 09/23/2015 09:03:16 Stencel, Mukesh L. ([id number]) -------------------------------------------------------------------------------- Problem List Details Patient Name: [name], [name] Date of Service: 09/23/2015 8:00 AM Medical Record Number: [id number] Patient Account Number: [id number] Date of Birth/Sex: 02/15/1941 (74 y.o. Male) Treating RN: Montey Hora Primary Care Physician: Arnette Norris Other Clinician: Referring Physician: Arnette Norris Treating Physician/Extender: BURNS III, Charlean Sanfilippo in Treatment: 0 Active Problems ICD-10 Encounter Code Description Active Date Diagnosis E11.621 Type 2 diabetes mellitus with foot ulcer 09/23/2015 Yes S91.102A Unspecified open wound of left great toe without damage 09/23/2015 Yes to nail, initial encounter S91.105A Unspecified open wound of left lesser toe(s) without 09/23/2015 Yes damage to nail, initial encounter L03.032 Cellulitis of left toe 09/23/2015 Yes E11.40 Type 2 diabetes mellitus with diabetic  neuropathy, 09/23/2015 Yes unspecified Inactive Problems Resolved Problems Electronic Signature(s) Signed: 09/23/2015 3:49:04 PM By: Loletha Grayer MD Entered By: Loletha Grayer on 09/23/2015 14:59:15 Birchmeier, Alejos L. ([id number]) -------------------------------------------------------------------------------- Progress Note/History and Physical Details Patient Name: [name], [name] Date of Service: 09/23/2015 8:00 AM Medical Record Number: [id number] Patient Account Number: [id number] Date of Birth/Sex: April 07, 1941 (74 y.o. Male) Treating RN: Montey Hora Primary Care Physician: Arnette Norris Other Clinician: Referring  Physician: Arnette Norris Treating Physician/Extender: BURNS III, Charlean Sanfilippo in Treatment: 0 Subjective Chief Complaint Information obtained from Patient Left toe ulcerations. History of Present Illness (HPI) Pleasant 74 year old with history of diabetes (hemoglobin A1c reportedly 6), peripheral neuropathy, and sleep apnea. No history of PAD. Left ABI 1.01. He developed blisters and subsequent ulcerations on the plantar aspect of his left first and second toe in early December 2016. Denies any trauma, unusual activity, or new footwear. Has been fitted for orthotics in the past but did not like them. He is ambulating per his baseline without difficulty. No claudication or rest pain. 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 Started on doxycycline yesterday. Not performing dressing changes. No significant pain. No fever or chills. Minimal drainage. Wound History Patient presents with 1 open wound that has been present for approximately 1 week. Patient has been treating wound in the following manner: bandage from Dr Deborra Medina. Laboratory tests have been performed in the last month. Patient reportedly has not tested positive for an antibiotic resistant organism. Patient reportedly has not tested positive for osteomyelitis. Patient reportedly has not had testing performed to evaluate circulation in the legs. Patient History Information obtained from Patient. Allergies Sudafed Family History Cancer - Mother, Father, Diabetes - Mother, Heart Disease - Mother, Hypertension - Mother, No family history of Hereditary Spherocytosis, Kidney Disease, Lung Disease, Seizures, Stroke, Thyroid Problems, Tuberculosis. Social History Former smoker - quit about 25 years ago, Marital Status - Married, Alcohol Use - Never, Drug Use - No Cromwell, Lemonte L. ([id number]) History, Caffeine Use - Never. Medical History Endocrine Patient has history of Type II Diabetes Musculoskeletal Patient has  history of Osteoarthritis Neurologic Patient has history of Neuropathy Oncologic Denies history of Received Chemotherapy, Received Radiation Patient is treated with Oral Agents. Blood sugar is tested. Hospitalization/Surgery History - 09/09/2012, [facility], small bowel obstruction and colostomy. Medical And Surgical History Notes Genitourinary BPH Review of Systems (ROS) Constitutional Symptoms (General Health) The patient has no complaints or symptoms. Eyes Complains or has symptoms of Glasses / Contacts - glasses. Ear/Nose/Mouth/Throat The patient has no complaints or symptoms. Hematologic/Lymphatic The patient has no complaints or symptoms. Respiratory The patient has no complaints or symptoms. Cardiovascular Complains or has symptoms of LE edema. Gastrointestinal The patient has no complaints or symptoms. Endocrine The patient has no complaints or symptoms. Genitourinary The patient has no complaints or symptoms. Immunological The patient has no complaints or symptoms. Integumentary (Skin) The patient has no complaints or symptoms. Musculoskeletal The patient has no complaints or symptoms. Neurologic The patient has no complaints or symptoms. Oncologic The patient has no complaints or symptoms. Psychiatric The patient has no complaints or symptoms. Folds, Finneas L. ([id number]) Objective Constitutional Pulse regular. Respirations normal and unlabored. Afebrile. Vitals Time Taken: 8:16 AM, Height: 72 in, Source: Stated, Weight: 250 lbs, Source: Measured, BMI: 33.9, Temperature: 98.2 F, Pulse: 76 bpm, Respiratory Rate: 18 breaths/min, Blood Pressure: 145/78 mmHg. Respiratory WNL. No retractions.. Cardiovascular Pedal Pulses WNL. Psychiatric Judgement and insight Intact.. Oriented times 3.. No evidence of depression, anxiety, or  agitation.. General Notes: Left first and second toe plantar ulcerations. Full-thickness. No exposed deep structures. Cellulitis  involving the entirety of the left second toe. Biopsy culture obtained. Palpable DP. Left ABI 1.01. Integumentary (Hair, Skin) Wound #1 status is Open. Original cause of wound was Blister. The wound is located on the [employer]. The wound measures 0.7cm length x 1cm width x 0.1cm depth; 0.55cm^2 area and 0.055cm^3 volume. The wound is limited to skin breakdown. There is no tunneling or undermining noted. There is a medium amount of serous drainage noted. The wound margin is flat and intact. There is small (1-33%) red granulation within the wound bed. There is a large (67-100%) amount of necrotic tissue within the wound bed including Eschar and Adherent Slough. The periwound skin appearance exhibited: Localized Edema, Erythema. The periwound skin appearance did not exhibit: Callus, Crepitus, Excoriation, Fluctuance, Friable, Induration, Rash, Scarring, Dry/Scaly, Maceration, Moist, Atrophie Blanche, Cyanosis, Ecchymosis, Hemosiderin Staining, Mottled, Pallor, Rubor. The surrounding wound skin color is noted with erythema which is circumferential. Periwound temperature was noted as No Abnormality. The periwound has tenderness on palpation. Wound #2 status is Open. Original cause of wound was Blister. The wound is located on the Left,Plantar Toe Second. The wound measures 1cm length x 1.2cm width x 0.1cm depth; 0.942cm^2 area and 0.094cm^3 volume. The wound is limited to skin breakdown. There is no tunneling or undermining noted. There is a medium amount of serous drainage noted. The wound margin is flat and intact. There is small (1-33%) red granulation within the wound bed. There is a large (67-100%) amount of necrotic tissue within the wound bed including Eschar and Adherent Slough. The periwound skin appearance exhibited: Localized Edema, Erythema. The periwound skin appearance did not exhibit: Callus, Crepitus, Excoriation, Peyser, Latrail L. ([id number]) Fluctuance, Friable,  Induration, Rash, Scarring, Dry/Scaly, Maceration, Moist, Atrophie Blanche, Cyanosis, Ecchymosis, Hemosiderin Staining, Mottled, Pallor, Rubor. The surrounding wound skin color is noted with erythema which is circumferential. Periwound temperature was noted as No Abnormality. The periwound has tenderness on palpation. Assessment Active Problems ICD-10 E11.621 - Type 2 diabetes mellitus with foot ulcer S91.102A - Unspecified open wound of left great toe without damage to nail, initial encounter S91.105A - Unspecified open wound of left lesser toe(s) without damage to nail, initial encounter L03.032 - Cellulitis of left toe E11.40 - Type 2 diabetes mellitus with diabetic neuropathy, unspecified Left first and second toe ulcerations with associated cellulitis. Procedures Wound #1 Wound #1 is a Diabetic Wound/Ulcer of the Lower Extremity located on the Left,Plantar Toe Great . There was a Skin/Subcutaneous Tissue Debridement [id number] debridement with total area of 0.7 sq cm performed by BURNS III, Teressa Senter., MD. with the following instrument(s): Curette to remove Viable and Non-Viable tissue/material including Fat, Fibrin/Slough, and Subcutaneous after achieving pain control using Lidocaine 4% Topical Solution. A time out was conducted prior to the start of the procedure. A Minimum amount of bleeding was controlled with Pressure. The procedure was tolerated well with a pain level of 0 throughout and a pain level of 0 following the procedure. Post Debridement Measurements: 0.7cm length x 1cm width x 0.2cm depth; 0.11cm^3 volume. Post procedure Diagnosis Wound #1: Same as Pre-Procedure Wound #2 Wound #2 is a Diabetic Wound/Ulcer of the Lower Extremity located on the Left,Plantar Toe Second . There was a Skin/Subcutaneous Tissue Debridement [id number] debridement with total area of 1.2 sq cm performed by BURNS III, Teressa Senter., MD. with the following instrument(s): Curette to remove Viable  and Non-Viable tissue/material  including Fat, Fibrin/Slough, and Subcutaneous after achieving pain control using Lidocaine 4% Topical Solution. A time out was conducted prior to the start of the procedure. A Minimum amount of bleeding was controlled with Pressure. The procedure was tolerated well with a pain level of 0 throughout and a pain level of 0 following the procedure. Post Debridement Measurements: 1cm length x 1.2cm width x 0.2cm depth; 0.188cm^3 volume. Maguire, Ruger L. ([id number]) Post procedure Diagnosis Wound #2: Same as Pre-Procedure General Notes: Biopsy culture obtained.. Plan Wound Cleansing: Wound #1 Left,Plantar Toe Great: Clean wound with Normal Saline. Wound #2 Left,Plantar Toe Second: Clean wound with Normal Saline. Anesthetic: Wound #1 Left,Plantar Toe Great: Topical Lidocaine 4% cream applied to wound bed prior to debridement Wound #2 Left,Plantar Toe Second: Topical Lidocaine 4% cream applied to wound bed prior to debridement Primary Wound Dressing: Wound #1 Left,Plantar Toe Great: Aquacel Ag Wound #2 Left,Plantar Toe Second: Aquacel Ag Secondary Dressing: Wound #1 Left,Plantar Toe Great: Gauze and Kerlix/Conform Wound #2 Left,Plantar Toe Second: Gauze and Kerlix/Conform Dressing Change Frequency: Wound #1 Left,Plantar Toe Great: Change dressing every day. Wound #2 Left,Plantar Toe Second: Change dressing every day. Follow-up Appointments: Wound #1 Left,Plantar Toe Great: Return Appointment in 1 week. Wound #2 Left,Plantar Toe Second: Return Appointment in 1 week. Laboratory ordered were: Culture and Sensitivity - left 2nd toe Radiology ordered were: X-ray, foot - left Brewbaker, Cortland L. ([id number]) silver alginate dressing changes. Offloading recommendations given. [employer] consultation. Continue doxycycline. Follow-up on wound culture obtained today. X-ray to rule out underlying osteomyelitis. Addendum: Left foot x-ray today showed no evidence for  foreign body, fracture, or osteomyelitis. Electronic Signature(s) Signed: 09/23/2015 3:49:04 PM By: Loletha Grayer MD Entered By: Loletha Grayer on 09/23/2015 15:06:10 Haislip, Harlin L. ([id number]) -------------------------------------------------------------------------------- ROS/PFSH Details Patient Name: Eshleman, Maalik L. Date of Service: 09/23/2015 8:00 AM Medical Record Number: [id number] Patient Account Number: [id number] Date of Birth/Sex: October 03, 1941 (74 y.o. Male) Treating RN: Montey Hora Primary Care Physician: Arnette Norris Other Clinician: Referring Physician: Arnette Norris Treating Physician/Extender: BURNS III, Charlean Sanfilippo in Treatment: 0 Label Progress Note Print Version as History and Physical for this encounter Information Obtained From Patient Wound History Do you currently have one or more open woundso Yes How many open wounds do you currently haveo 1 Approximately how long have you had your woundso 1 week How have you been treating your wound(s) until nowo bandage from Dr Deborra Medina Has your wound(s) ever healed and then re-openedo No Have you had any lab work done in the past montho Yes Who ordered the lab work doneo Dr Deborra Medina Have you tested positive for an antibiotic resistant organism (MRSA, VRE)o No Have you tested positive for osteomyelitis (bone infection)o No Have you had any tests for circulation on your legso No Eyes Complaints and Symptoms: Positive for: Glasses / Contacts - glasses Cardiovascular Complaints and Symptoms: Positive for: LE edema Constitutional Symptoms (General Health) Complaints and Symptoms: No Complaints or Symptoms Ear/Nose/Mouth/Throat Complaints and Symptoms: No Complaints or Symptoms Hematologic/Lymphatic Complaints and Symptoms: No Complaints or Symptoms Respiratory Moncur, Muhamad L. ([id number]) Complaints and Symptoms: No Complaints or Symptoms Gastrointestinal Complaints and Symptoms: No Complaints or  Symptoms Endocrine Complaints and Symptoms: No Complaints or Symptoms Medical History: Positive for: Type II Diabetes Time with diabetes: several years Treated with: Oral agents Blood sugar tested every day: Yes Tested : BID Genitourinary Complaints and Symptoms: No Complaints or Symptoms Medical History: Past Medical History Notes: BPH Immunological Complaints and Symptoms: No Complaints or Symptoms Integumentary (  Skin) Complaints and Symptoms: No Complaints or Symptoms Musculoskeletal Complaints and Symptoms: No Complaints or Symptoms Medical History: Positive for: Osteoarthritis Neurologic Complaints and Symptoms: No Complaints or Symptoms Kennebrew, Jonmarc L. ([id number]) Medical History: Positive for: Neuropathy Oncologic Complaints and Symptoms: No Complaints or Symptoms Medical History: Negative for: Received Chemotherapy; Received Radiation Psychiatric Complaints and Symptoms: No Complaints or Symptoms Immunizations Immunization Notes: up to date Hospitalization / Surgery History Name of Hospital Purpose of Hospitalization/Surgery Date [place] small bowel obstruction and colostomy 09/09/2012 Family and Social History Cancer: Yes - Mother, Father; Diabetes: Yes - Mother; Heart Disease: Yes - Mother; Hereditary Spherocytosis: No; Hypertension: Yes - Mother; Kidney Disease: No; Lung Disease: No; Seizures: No; Stroke: No; Thyroid Problems: No; Tuberculosis: No; Former smoker - quit about 25 years ago; Marital Status - Married; Alcohol Use: Never; Drug Use: No History; Caffeine Use: Never; Financial Concerns: No; Food, Clothing or Shelter Needs: No; Support System Lacking: No; Transportation Concerns: No; Advanced Directives: No; Patient does not want information on Advanced Directives Physician Affirmation I have reviewed and agree with the above information. Electronic Signature(s) Signed: 09/23/2015 3:49:04 PM By: Loletha Grayer MD Signed: 09/23/2015  5:20:32 PM By: Montey Hora Entered By: Loletha Grayer on 09/23/2015 15:05:54 Lambrecht, Greyden L. ([id number]) -------------------------------------------------------------------------------- SuperBill Details Patient Name: Machuca, Eshawn L. Date of Service: 09/23/2015 Medical Record Number: [id number] Patient Account Number: [id number] Date of Birth/Sex: 07-31-1941 (74 y.o. Male) Treating RN: Montey Hora Primary Care Physician: Arnette Norris Other Clinician: Referring Physician: Arnette Norris Treating Physician/Extender: BURNS III, Charlean Sanfilippo in Treatment: 0 Diagnosis Coding ICD-10 Codes Code Description E11.621 Type 2 diabetes mellitus with foot ulcer S91.102A Unspecified open wound of left great toe without damage to nail, initial encounter S91.105A Unspecified open wound of left lesser toe(s) without damage to nail, initial encounter L03.032 Cellulitis of left toe E11.40 Type 2 diabetes mellitus with diabetic neuropathy, unspecified Facility Procedures CPT4: Description Modifier Quantity Code [id number] - WOUND CARE VISIT-LEV 3 EST PT 1 CPT4: [id number] - DEB SUBQ TISSUE 20 SQ CM/< 1 ICD-10 Description Diagnosis E11.621 Type 2 diabetes mellitus with foot ulcer S91.102A Unspecified open wound of left great toe without damage to nail, initial encounter S91.105A Unspecified open wound  of left lesser toe(s) without damage to nail, initial encounter Physician Procedures CPT4: Description Modifier Quantity Code [id number] - WC PHYS LEVEL 4 - NEW PT 1 ICD-10 Description Diagnosis E11.621 Type 2 diabetes mellitus with foot ulcer L03.032 Cellulitis of left toe CPT4: [id number] - WC PHYS SUBQ TISS 20 SQ CM 1 ICD-10 Description Diagnosis E11.621 Type 2 diabetes mellitus with foot ulcer [place], Tuff L. ([id number]) Electronic Signature(s) Signed: 09/23/2015 3:49:04 PM By: Loletha Grayer MD Entered By: Loletha Grayer on 09/23/2015 15:05:34

## 2015-09-24 NOTE — Telephone Encounter (Signed)
Rx written and in Kim's box. 

## 2015-09-24 NOTE — Progress Notes (Signed)
Nicholas [name], [name] ([id number]) Visit Report for 09/23/2015 Abuse/Suicide Risk Screen Details Patient Name: Nicholas [name], [name] Date of Service: 09/23/2015 8:00 AM Medical Record Number: [id number] Patient Account Number: [id number] Date of Birth/Sex: 03-Oct-1941 (74 y.o. Male) Treating RN: Montey Hora Primary Care Physician: Arnette Norris Other Clinician: Referring Physician: Arnette Norris Treating Physician/Extender: BURNS III, Charlean Sanfilippo in Treatment: 0 Abuse/Suicide Risk Screen Items Answer ABUSE/SUICIDE RISK SCREEN: Has anyone close to you tried to hurt or harm you recentlyo No Do you feel uncomfortable with anyone in your familyo No Has anyone forced you do things that you didnot want to doo No Do you have any thoughts of harming yourselfo No Patient displays signs or symptoms of abuse and/or neglect. No Electronic Signature(s) Signed: 09/23/2015 5:20:32 PM By: Montey Hora Entered By: Montey Hora on 09/23/2015 08:25:32 Nicholas [name], [name] ([id number]) -------------------------------------------------------------------------------- Activities of Daily Living Details Patient Name: [name], [name] Date of Service: 09/23/2015 8:00 AM Medical Record Number: [id number] Patient Account Number: [id number] Date of Birth/Sex: [birth year] (74 y.o. Male) Treating RN: Montey Hora Primary Care Physician: Arnette Norris Other Clinician: Referring Physician: Arnette Norris Treating Physician/Extender: BURNS III, Charlean Sanfilippo in Treatment: 0 Activities of Daily Living Items Answer Activities of Daily Living (Please select one for each item) Drive Automobile Completely Able Take Medications Completely Able Use Telephone Completely Able Care for Appearance Completely Able Use Toilet Completely Able Bath / Shower Completely Able Dress Self Completely Able Feed Self Completely Able Walk Completely Able Get In / Out Bed Completely Able Housework Completely Able Prepare Meals Completely [place] for Self Completely Able Electronic Signature(s) Signed: 09/23/2015 5:20:32 PM By: Montey Hora Entered By: Montey Hora on 09/23/2015 08:25:52 [name], [name] ([id number]) -------------------------------------------------------------------------------- Education Assessment Details Patient Name: [name], [name] Date of Service: 09/23/2015 8:00 AM Medical Record Number: [id number] Patient Account Number: [id number] Date of Birth/Sex: 1941-03-21 (74 y.o. Male) Treating RN: Montey Hora Primary Care Physician: Arnette Norris Other Clinician: Referring Physician: Arnette Norris Treating Physician/Extender: BURNS III, Charlean Sanfilippo in Treatment: 0 Primary Learner Assessed: Patient Learning Preferences/Education Level/Primary Language Learning Preference: Explanation, Demonstration Highest Education Level: College or Above Preferred Language: English Cognitive Barrier Assessment/Beliefs Language Barrier: No Translator Needed: No Memory Deficit: No Emotional Barrier: No Cultural/Religious Beliefs Affecting Medical No Care: Physical Barrier Assessment Impaired Vision: No Impaired Hearing: Yes HOH Decreased Hand dexterity: No Knowledge/Comprehension Assessment Knowledge Level: Medium Comprehension Level: Medium Ability to understand written Medium instructions: Ability to understand verbal Medium instructions: Motivation Assessment Anxiety Level: Calm Cooperation: Cooperative Education Importance: Acknowledges Need Interest in Health Problems: Asks Questions Perception: Coherent Willingness to Engage in Self- Medium Management Activities: Readiness to Engage in Self- Medium Management Activities: Electronic Signature(s) Nicholas [name], [name] ([id number]) Signed: 09/23/2015 5:20:32 PM By: Montey Hora Entered By: Montey Hora on 09/23/2015 08:26:24 Nicholas [name], [name]  ([id number]) -------------------------------------------------------------------------------- Fall Risk Assessment Details Patient Name: Nicholas [name], [name] Date of Service: 09/23/2015 8:00 AM Medical Record Number: [id number] Patient Account Number: [id number] Date of Birth/Sex: March 18, 1941 (74 y.o. Male) Treating RN: Montey Hora Primary Care Physician: Arnette Norris Other Clinician: Referring Physician: Arnette Norris Treating Physician/Extender: BURNS III, Charlean Sanfilippo in Treatment: 0 Fall Risk Assessment Items Have you had 2 or more falls in the last 12 monthso 0 No Have you had any fall that resulted in injury in the last 12 monthso 0 No FALL RISK ASSESSMENT: History of falling - immediate or within 3 months 0 No Secondary diagnosis 0 No Ambulatory aid None/bed rest/wheelchair/nurse 0  Yes Crutches/cane/walker 0 No Furniture 0 No IV Access/Saline Lock 0 No Gait/Training Normal/bed rest/immobile 0 Yes Weak 0 No Impaired 0 No Mental Status Oriented to own ability 0 Yes Electronic Signature(s) Signed: 09/23/2015 5:20:32 PM By: Montey Hora Entered By: Montey Hora on 09/23/2015 08:26:42 Nicholas [name], [name] ([id number]) -------------------------------------------------------------------------------- Foot Assessment Details Patient Name: Nicholas [name], [name] Date of Service: 09/23/2015 8:00 AM Medical Record Number: [id number] Patient Account Number: [id number] Date of Birth/Sex: 1941/02/17 (74 y.o. Male) Treating RN: Montey Hora Primary Care Physician: Arnette Norris Other Clinician: Referring Physician: Arnette Norris Treating Physician/Extender: BURNS III, Charlean Sanfilippo in Treatment: 0 Foot Assessment Items Site Locations + = Sensation present, - = Sensation absent, C = Callus, U = Ulcer R = Redness, W = Warmth, M = Maceration, PU = Pre-ulcerative lesion F = Fissure, S = Swelling, D = Dryness Assessment Right: Left: Other Deformity: No No Prior Foot Ulcer: No No Prior Amputation: No  No Charcot Joint: No No Ambulatory Status: Ambulatory Without Help Gait: Steady Electronic Signature(s) Signed: 09/23/2015 5:20:32 PM By: Montey Hora Entered By: Montey Hora on 09/23/2015 08:32:09 Nicholas [name], [name] ([id number]) -------------------------------------------------------------------------------- Nutrition Risk Assessment Details Patient Name: Nicholas [name], [name] Date of Service: 09/23/2015 8:00 AM Medical Record Number: [id number] Patient Account Number: [id number] Date of Birth/Sex: July 11, 1941 (74 y.o. Male) Treating RN: Montey Hora Primary Care Physician: Arnette Norris Other Clinician: Referring Physician: Arnette Norris Treating Physician/Extender: BURNS III, Charlean Sanfilippo in Treatment: 0 Height (in): 72 Weight (lbs): 250 Body Mass Index (BMI): 33.9 Nutrition Risk Assessment Items NUTRITION RISK SCREEN: I have an illness or condition that made me change the kind and/or 0 No amount of food I eat I eat fewer than two meals per day 0 No I eat few fruits and vegetables, or milk products 0 No I have three or more drinks of beer, liquor or wine almost every day 0 No I have tooth or mouth problems that make it hard for me to eat 0 No I [place] always have enough money to buy the food I need 0 No I eat alone most of the time 0 No I take three or more different prescribed or over-the-counter drugs a 1 Yes day Without wanting to, I have lost or gained 10 pounds in the last six 0 No months I am not always physically able to shop, cook and/or feed myself 0 No Nutrition Protocols Good Risk Protocol 0 No interventions needed Moderate Risk Protocol Electronic Signature(s) Signed: 09/23/2015 5:20:32 PM By: Montey Hora Entered By: Montey Hora on 09/23/2015 [id number]

## 2015-09-24 NOTE — Telephone Encounter (Signed)
Nicholas [name], can you please write an rx for this?  Thanks so much!

## 2015-09-25 NOTE — Telephone Encounter (Signed)
Spoke to pt and informed him order is available for pickup from the front desk

## 2015-09-27 LAB — WOUND CULTURE

## 2015-09-28 ENCOUNTER — Other Ambulatory Visit: Payer: Self-pay | Admitting: Family Medicine

## 2015-09-28 NOTE — Telephone Encounter (Signed)
Last f/u 05/2015 

## 2015-09-29 NOTE — Telephone Encounter (Signed)
Rx called to pharmacy as instructed. 

## 2015-09-30 ENCOUNTER — Encounter: Payer: Medicare Other | Admitting: Surgery

## 2015-09-30 DIAGNOSIS — S91102D Unspecified open wound of left great toe without damage to nail, subsequent encounter: Secondary | ICD-10-CM | POA: Diagnosis not present

## 2015-09-30 DIAGNOSIS — S91105D Unspecified open wound of left lesser toe(s) without damage to nail, subsequent encounter: Secondary | ICD-10-CM | POA: Diagnosis not present

## 2015-09-30 DIAGNOSIS — E11621 Type 2 diabetes mellitus with foot ulcer: Secondary | ICD-10-CM | POA: Diagnosis not present

## 2015-09-30 DIAGNOSIS — E1140 Type 2 diabetes mellitus with diabetic neuropathy, unspecified: Secondary | ICD-10-CM | POA: Diagnosis not present

## 2015-09-30 DIAGNOSIS — S91102A Unspecified open wound of left great toe without damage to nail, initial encounter: Secondary | ICD-10-CM | POA: Diagnosis not present

## 2015-09-30 DIAGNOSIS — L03032 Cellulitis of left toe: Secondary | ICD-10-CM | POA: Diagnosis not present

## 2015-09-30 DIAGNOSIS — G4730 Sleep apnea, unspecified: Secondary | ICD-10-CM | POA: Diagnosis not present

## 2015-09-30 DIAGNOSIS — S91105A Unspecified open wound of left lesser toe(s) without damage to nail, initial encounter: Secondary | ICD-10-CM | POA: Diagnosis not present

## 2015-10-01 NOTE — Progress Notes (Signed)
Berhow, Deshon L. ([id number]) Visit Report for 09/30/2015 Arrival Information Details Patient Name: Nicholas [name], [name] Date of Service: 09/30/2015 9:00 AM Medical Record Number: [id number] Patient Account Number: [id number] Date of Birth/Sex: 1941-08-13 (74 y.o. Male) Treating RN: Ahmed Prima Primary Care Physician: Arnette Norris Other Clinician: Referring Physician: Arnette Norris Treating Physician/Extender: BURNS III, Charlean Sanfilippo in Treatment: 1 Visit Information History Since Last Visit Added or deleted any medications: No Patient Arrived: Ambulatory Any new allergies or adverse reactions: No Arrival Time: 09:14 Had a fall or experienced change in No Accompanied By: self activities of daily living that may affect Transfer Assistance: None risk of falls: Patient Identification Verified: Yes Signs or symptoms of abuse/neglect since last No Secondary Verification Process Yes visito Completed: Has Dressing in Place as Prescribed: Yes Patient Has Alerts: Yes Pain Present Now: Yes Patient Alerts: DMII Electronic Signature(s) Signed: 09/30/2015 4:19:08 PM By: Alric Quan Entered By: Alric Quan on 09/30/2015 09:15:37 Poole, Bradly L. ([id number]) -------------------------------------------------------------------------------- Encounter Discharge Information Details Patient Name: Nicholas [name], [name] Date of Service: 09/30/2015 9:00 AM Medical Record Number: [id number] Patient Account Number: [id number] Date of Birth/Sex: [birth year] (74 y.o. Male) Treating RN: Ahmed Prima Primary Care Physician: Arnette Norris Other Clinician: Referring Physician: Arnette Norris Treating Physician/Extender: BURNS III, Charlean Sanfilippo in Treatment: 1 Encounter Discharge Information Items Discharge Pain Level: 0 Discharge Condition: Stable Ambulatory Status: Ambulatory Discharge Destination: Home Transportation: Private Auto Accompanied By: self Schedule Follow-up Appointment: Yes Medication  Reconciliation completed and provided to Patient/Care Yes Rileigh Kawashima: Provided on Clinical Summary of Care: 09/30/2015 Form Type Recipient Paper Patient [facility] Electronic Signature(s) Signed: 09/30/2015 9:40:26 AM By: Ruthine Dose Entered By: Ruthine Dose on 09/30/2015 09:40:26 Macbride, Kamani L. ([id number]) -------------------------------------------------------------------------------- Lower Extremity Assessment Details Patient Name: Nicholas [name], [name] Date of Service: 09/30/2015 9:00 AM Medical Record Number: [id number] Patient Account Number: [id number] Date of Birth/Sex: 03-11-1941 (74 y.o. Male) Treating RN: Ahmed Prima Primary Care Physician: Arnette Norris Other Clinician: Referring Physician: Arnette Norris Treating Physician/Extender: BURNS III, Charlean Sanfilippo in Treatment: 1 Edema Assessment Assessed: [Left: No] [Right: No] Edema: [Left: N] [Right: o] Vascular Assessment Pulses: Posterior Tibial Dorsalis Pedis Palpable: [Left:Yes] Extremity colors, hair growth, and conditions: Extremity Color: [Left:Pale] Temperature of Extremity: [Left:Cool] Capillary Refill: [Left:< 3 seconds] Toe Nail Assessment Left: Right: Thick: No Discolored: No Deformed: No Improper Length and Hygiene: No Electronic Signature(s) Signed: 09/30/2015 4:19:08 PM By: Alric Quan Entered By: Alric Quan on 09/30/2015 09:18:27 Trick, Kayman L. ([id number]) -------------------------------------------------------------------------------- Multi Wound Chart Details Patient Name: Nicholas [name], [name] Date of Service: 09/30/2015 9:00 AM Medical Record Number: [id number] Patient Account Number: [id number] Date of Birth/Sex: [birth year] (74 y.o. 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] [2:the Lower Extremity] Comorbid History: [1:Type II Diabetes, Osteoarthritis, Neuropathy Osteoarthritis, Neuropathy] [2:Type II Diabetes,] [N/A:N/A] Date Acquired: [1:09/16/2015] [2:09/16/2015] [N/A:N/A] Weeks of Treatment: [1:1] [2:1] [N/A:N/A] Wound Status: [1:Open] [2:Open] [N/A:N/A] Measurements L x W x D 1.5x0.3x0.1 [2:0.3x0.5x0.1] [N/A:N/A] (cm) Area (cm) : [1:0.353] [2:0.118] [N/A:N/A] Volume (cm) : [1:0.035] [2:0.012] [N/A:N/A] % Reduction in Area: [1:35.80%] [2:87.50%] [N/A:N/A] % Reduction in Volume:  36.40% [2:87.20%] [N/A:N/A] Classification: [1:Grade 2] [2:Grade 2] [N/A:N/A] Exudate Amount: [1:Medium] [2:Medium] [N/A:N/A] Exudate Type: [1:Serous] [2:Serous] [N/A:N/A] Exudate Color: [1:amber] [2:amber] [N/A:N/A] Wound Margin: [1:Flat and Intact] [2:Flat and Intact] [N/A:N/A] Granulation Amount: [1:Small (1-33%)] [2:Small (1-33%)] [N/A:N/A] Granulation Quality: [1:Red] [2:Red] [N/A:N/A] Necrotic Amount: [1:Large (67-100%)] [2:Large (67-100%)] [N/A:N/A] Necrotic Tissue: [1:Eschar, Adherent Slough Eschar, Adherent Slough N/A] Exposed Structures: [1:Fascia: No Fat: No Tendon: No Muscle: No Joint: No] [2:Fascia: No Fat: No Tendon: No Muscle: No Joint: No] [N/A:N/A] Bone: No Bone: No Limited to Skin Limited to Skin Breakdown Breakdown Epithelialization: None None N/A Periwound Skin Texture: Edema: Yes Edema: No N/A Excoriation: No Excoriation: No Induration: No Induration: No Callus: No Callus: No Crepitus: No Crepitus: No Fluctuance: No Fluctuance: No Friable: No Friable: No Rash: No Rash:  No Scarring: No Scarring: No Periwound Skin Maceration: No Maceration: No N/A Moisture: Moist: No Moist: No Dry/Scaly: No Dry/Scaly: No Periwound Skin Color: Erythema: Yes Erythema: Yes N/A Atrophie Blanche: No Atrophie Blanche: No Cyanosis: No Cyanosis: No Ecchymosis: No Ecchymosis: No Hemosiderin Staining: No Hemosiderin Staining: No Mottled: No Mottled: No Pallor: No Pallor: No Rubor: No Rubor: No Erythema Location: Circumferential Circumferential N/A Temperature: No Abnormality No Abnormality N/A Tenderness on Yes Yes N/A Palpation: Wound Preparation: Ulcer Cleansing: Ulcer Cleansing: N/A Rinsed/Irrigated with Rinsed/Irrigated with Saline Saline Topical Anesthetic Topical Anesthetic Applied: Other: lidocaine Applied: Other: lidocaine 4% 4% Treatment Notes Electronic Signature(s) Signed: 09/30/2015 4:19:08 PM By: Alric Quan Entered By: Alric Quan on 09/30/2015 09:22:48 Nicholas [name], Nicholas [name] ([id number]) -------------------------------------------------------------------------------- [place] Details Patient Name: Nicholas [name], [name] Date of Service: 09/30/2015 9:00 AM Medical Record Number: [id number] Patient Account Number: [id number] Date of Birth/Sex: 06-Apr-1941 (74 y.o. Male) Treating RN: Ahmed Prima Primary Care Physician: Arnette Norris Other Clinician: Referring Physician: Arnette Norris Treating Physician/Extender: BURNS III, Charlean Sanfilippo in Treatment: 1 Active Inactive Orientation to the Wound Care Program Nursing Diagnoses: Knowledge deficit related to the wound healing center program Goals: Patient/caregiver will verbalize understanding of the [place] Program Date Initiated: 09/23/2015 Goal Status: Active Interventions: Provide education on orientation to the wound center Notes: Wound/Skin Impairment Nursing Diagnoses: Impaired tissue integrity Knowledge deficit related to ulceration/compromised skin  integrity Goals: Patient/caregiver will verbalize understanding of skin care regimen Date Initiated: 09/23/2015 Goal Status: Active Ulcer/skin breakdown will have a volume reduction of 30% by week 4 Date Initiated: 09/23/2015 Goal Status: Active Ulcer/skin breakdown will have a volume reduction of 50% by week 8 Date Initiated: 09/23/2015 Goal Status: Active Ulcer/skin breakdown will have a volume reduction of 80% by week 12 Date Initiated: 09/23/2015 Goal Status: Active Ulcer/skin breakdown will heal within 14 weeks Date Initiated: 09/23/2015 Nicholas [name], [name] ([id number]) Goal Status: Active Interventions: Assess patient/caregiver ability to perform ulcer/skin care regimen upon admission and as needed Assess ulceration(s) every visit Notes: Electronic Signature(s) Signed: 09/30/2015 4:19:08 PM By: Alric Quan Entered By: Alric Quan on 09/30/2015 09:22:40 Nicholas [name], Markhi L. ([id number]) -------------------------------------------------------------------------------- Pain Assessment Details Patient Name: Hur, Maurizio L. Date of Service: 09/30/2015 9:00 AM Medical Record Number: [id number] Patient Account Number: [id number] Date of Birth/Sex: 11-Feb-1941 (74 y.o. Male) Treating RN: Ahmed Prima Primary Care Physician: Arnette Norris Other Clinician: Referring Physician: Arnette Norris Treating Physician/Extender: BURNS III, Charlean Sanfilippo in Treatment: 1 Active Problems Location of Pain Severity and Description of Pain Patient Has Paino Yes Site Locations Rate the pain. Current Pain Level: 5 Worst Pain Level: 8 Character of Pain Describe the Pain: Burning, Throbbing, Other: tightness Pain Management and Medication Current Pain Management:  Electronic Signature(s) Signed: 09/30/2015 4:19:08 PM By: Alric Quan Entered By: Alric Quan on 09/30/2015 09:16:28 Rider, Fulton L.  ([id number]) -------------------------------------------------------------------------------- Patient/Caregiver Education Details Patient Name: Wehling, Harveer L. Date of Service: 09/30/2015 9:00 AM Medical Record Number: [id number] Patient Account Number: [id number] Date of Birth/Gender: Jan 21, 1941 (74 y.o. Male) Treating RN: Ahmed Prima Primary Care Physician: Arnette Norris Other Clinician: Referring Physician: Arnette Norris Treating Physician/Extender: BURNS III, Charlean Sanfilippo in Treatment: 1 Education Assessment Education Provided To: Patient Education Topics Provided Wound/Skin Impairment: Handouts: Other: change dressing as ordered Methods: Demonstration, Explain/Verbal Responses: State content correctly Electronic Signature(s) Signed: 09/30/2015 4:19:08 PM By: Alric Quan Entered By: Alric Quan on 09/30/2015 09:39:08 Scalisi, Azahel L. ([id number]) -------------------------------------------------------------------------------- Wound Assessment Details Patient Name: Madara, Yacqub L. Date of Service: 09/30/2015 9:00 AM Medical Record Number: [id number] Patient Account Number: [id number] Date of Birth/Sex: [birth year] (74 y.o. Male) Treating RN: Ahmed Prima Primary Care Physician: Arnette Norris Other Clinician: Referring Physician: Arnette Norris Treating Physician/Extender: BURNS III, Charlean Sanfilippo in Treatment: 1 Wound Status Wound Number: 1 Primary Diabetic Wound/Ulcer of the Lower Etiology: Extremity Wound Location: Left Toe Great - Plantar Wound Status: Open Wounding Event: Blister Comorbid Type II Diabetes, Osteoarthritis, Date Acquired: 09/16/2015 History: Neuropathy Weeks Of Treatment: 1 Clustered Wound: No Photos Photo Uploaded By: Gretta Cool, RN, BSN, Kim on 09/30/2015 16:31:51 Wound Measurements Length: (cm) 1.5 Width: (cm) 0.3 Depth: (cm) 0.1 Area: (cm) 0.353 Volume: (cm) 0.035 % Reduction in Area: 35.8% % Reduction in Volume: 36.4% Epithelialization:  None Wound Description Classification: Grade 2 Wound Margin: Flat and Intact Exudate Amount: Medium Exudate Type: Serous Exudate Color: amber Foul Odor After Cleansing: No Wound Bed Granulation Amount: Small (1-33%) Exposed Structure Granulation Quality: Red Fascia Exposed: No Necrotic Amount: Large (67-100%) Fat Layer Exposed: No Necrotic Quality: Eschar, Adherent Slough Tendon Exposed: No Lampkins, Karin L. ([id number]) Muscle Exposed: No Joint Exposed: No Bone Exposed: No Limited to Skin Breakdown Periwound Skin Texture Texture Color No Abnormalities Noted: No No Abnormalities Noted: No Callus: No Atrophie Blanche: No Crepitus: No Cyanosis: No Excoriation: No Ecchymosis: No Fluctuance: No Erythema: Yes Friable: No Erythema Location: Circumferential Induration: No Hemosiderin Staining: No Localized Edema: Yes Mottled: No Rash: No Pallor: No Scarring: No Rubor: No Moisture Temperature / Pain No Abnormalities Noted: No Temperature: No Abnormality Dry / Scaly: No Tenderness on Palpation: Yes Maceration: No Moist: No Wound Preparation Ulcer Cleansing: Rinsed/Irrigated with Saline Topical Anesthetic Applied: Other: lidocaine 4%, Treatment Notes Wound #1 (Left, Plantar Toe Great) 1. Cleansed with: Clean wound with Normal Saline 2. Anesthetic Topical Lidocaine 4% cream to wound bed prior to debridement 4. Dressing Applied: Aquacel Ag 5. Secondary Dressing Applied Gauze and Kerlix/Conform 7. Secured with Paper tape Notes Neosporin, bandaid Electronic Signature(s) Signed: 09/30/2015 4:19:08 PM By: Alric Quan Entered By: Alric Quan on 09/30/2015 09:22:06 Guardino, Jailin L. ([id number]) Burmaster, Braedon L. ([id number]) -------------------------------------------------------------------------------- Wound Assessment Details Patient Name: Bellomo, Duell L. Date of Service: 09/30/2015 9:00 AM Medical Record Number: [id number] Patient Account Number:  [id number] Date of Birth/Sex: 02-Oct-1941 (74 y.o. Male) Treating RN: Ahmed Prima Primary Care Physician: Arnette Norris Other Clinician: Referring Physician: Arnette Norris Treating Physician/Extender: BURNS III, WALTER Weeks in Treatment: 1 Wound Status Wound Number: 2 Primary Diabetic Wound/Ulcer of the Lower Etiology: Extremity Wound Location: Left Toe Second - Plantar Wound Status: Open Wounding Event: Blister Comorbid Type II Diabetes, Osteoarthritis, Date Acquired: 09/16/2015 History: Neuropathy Weeks Of Treatment: 1 Clustered Wound: No Photos Photo Uploaded By: Gretta Cool, RN, BSN, Kim on 09/30/2015 16:31:52 Wound Measurements  Length: (cm) 0.3 Width: (cm) 0.5 Depth: (cm) 0.1 Area: (cm) 0.118 Volume: (cm) 0.012 % Reduction in Area: 87.5% % Reduction in Volume: 87.2% Epithelialization: None Wound Description Classification: Grade 2 Wound Margin: Flat and Intact Exudate Amount: Medium Exudate Type: Serous Exudate Color: amber Foul Odor After Cleansing: No Wound Bed Granulation Amount: Small (1-33%) Exposed Structure Granulation Quality: Red Fascia Exposed: No Necrotic Amount: Large (67-100%) Fat Layer Exposed: No Necrotic Quality: Eschar, Adherent Slough Tendon Exposed: No Vanorder, Marvelle L. ([id number]) Muscle Exposed: No Joint Exposed: No Bone Exposed: No Limited to Skin Breakdown Periwound Skin Texture Texture Color No Abnormalities Noted: No No Abnormalities Noted: No Callus: No Atrophie Blanche: No Crepitus: No Cyanosis: No Excoriation: No Ecchymosis: No Fluctuance: No Erythema: Yes Friable: No Erythema Location: Circumferential Induration: No Hemosiderin Staining: No Localized Edema: No Mottled: No Rash: No Pallor: No Scarring: No Rubor: No Moisture Temperature / Pain No Abnormalities Noted: No Temperature: No Abnormality Dry / Scaly: No Tenderness on Palpation: Yes Maceration: No Moist: No Wound Preparation Ulcer Cleansing:  Rinsed/Irrigated with Saline Topical Anesthetic Applied: Other: lidocaine 4%, Treatment Notes Wound #2 (Left, Plantar Toe Second) 1. Cleansed with: Clean wound with Normal Saline 2. Anesthetic Topical Lidocaine 4% cream to wound bed prior to debridement 4. Dressing Applied: Aquacel Ag 5. Secondary Dressing Applied Gauze and Kerlix/Conform 7. Secured with Paper tape Notes Neosporin, bandaid Electronic Signature(s) Signed: 09/30/2015 4:19:08 PM By: Alric Quan Entered By: Alric Quan on 09/30/2015 09:22:33 Jourdan, Mikias L. ([id number]) Robinson, Husayn L. ([id number]) -------------------------------------------------------------------------------- Wound Assessment Details Patient Name: Ojo, Aurel L. Date of Service: 09/30/2015 9:00 AM Medical Record Number: [id number] Patient Account Number: [id number] Date of Birth/Sex: [birth year] (74 y.o. Male) Treating RN: Ahmed Prima Primary Care Physician: Arnette Norris Other Clinician: Referring Physician: Arnette Norris Treating Physician/Extender: BURNS III, Charlean Sanfilippo in Treatment: 1 Wound Status Wound Number: 3 Primary Trauma, Other Etiology: Wound Location: Left Foot - Midline, Posterior Wound Status: Open Wounding Event: Blister Comorbid Type II Diabetes, Osteoarthritis, Date Acquired: 09/28/2015 History: Neuropathy Weeks Of Treatment: 0 Clustered Wound: No Photos Photo Uploaded By: Gretta Cool, RN, BSN, Kim on 09/30/2015 16:31:52 Wound Measurements Length: (cm) 0.6 Width: (cm) 0.6 Depth: (cm) 0.1 Area: (cm) 0.283 Volume: (cm) 0.028 % Reduction in Area: % Reduction in Volume: Epithelialization: None Tunneling: No Undermining: No Wound Description Classification: Partial Thickness Foul Odor Diabetic Severity Earleen Newport): Grade 0 Wound Margin: Flat and Intact Exudate Amount: None Present After Cleansing: No Wound Bed Granulation Amount: Large (67-100%) Exposed Structure Necrotic Amount: None Present (0%) Fascia  Exposed: No Fat Layer Exposed: No Tendon Exposed: No Muscle Exposed: No Edrington, Destin L. ([id number]) Joint Exposed: No Bone Exposed: No Limited to Skin Breakdown Periwound Skin Texture Texture Color No Abnormalities Noted: No No Abnormalities Noted: No Erythema: Yes Moisture Erythema Location: Circumferential No Abnormalities Noted: No Dry / Scaly: No Maceration: No Moist: No Wound Preparation Ulcer Cleansing: Rinsed/Irrigated with Saline Topical Anesthetic Applied: None Treatment Notes Wound #3 (Left, Midline, Posterior Foot) 1. Cleansed with: Clean wound with Normal Saline 2. Anesthetic Topical Lidocaine 4% cream to wound bed prior to debridement 4. Dressing Applied: Aquacel Ag 5. Secondary Dressing Applied Gauze and Kerlix/Conform 7. Secured with Paper tape Notes Neosporin, bandaid Electronic Signature(s) Signed: 09/30/2015 4:19:08 PM By: Alric Quan Entered By: Alric Quan on 09/30/2015 09:35:44 Roan, Kwan L. ([id number]) -------------------------------------------------------------------------------- Vitals Details Patient Name: Schiano, Willi L. Date of Service: 09/30/2015 9:00 AM Medical Record Number: [id number] Patient Account Number: [id number] Date of Birth/Sex: June 19, 1941 (74 y.o. Male) Treating  RN: Ahmed Prima Primary Care Physician: Arnette Norris Other Clinician: Referring Physician: Arnette Norris Treating Physician/Extender: BURNS III, Charlean Sanfilippo in Treatment: 1 Vital Signs Time Taken: 09:16 Temperature (F): 97.8 Height (in): 72 Pulse (bpm): 86 Weight (lbs): 250 Respiratory Rate (breaths/min): 18 Body Mass Index (BMI): 33.9 Blood Pressure (mmHg): 147/70 Reference Range: 80 - 120 mg / dl Electronic Signature(s) Signed: 09/30/2015 4:19:08 PM By: Alric Quan Entered By: Alric Quan on 09/30/2015 09:16:54

## 2015-10-01 NOTE — Progress Notes (Signed)
Puchalski, Hommer L. ([id number]) Visit Report for 09/30/2015 Chief Complaint Document Details Patient Name: Nicholas [name], [name] Date of Service: 09/30/2015 9:00 AM Medical Record Number: [id number] Patient Account Number: [id number] Date of Birth/Sex: 08-12-1941 (74 y.o. Male) Treating RN: Nicholas [name] Primary Care Physician: Nicholas [name] Other Clinician: Referring Physician: Arnette [name] Treating Physician/Extender: Nicholas [name] in Treatment: 1 Information Obtained from: Patient Chief Complaint Left toe ulcerations. New left foot ulcer. Electronic Signature(s) Signed: 09/30/2015 1:01:02 PM By: Loletha Grayer [name] Entered By: Loletha Grayer on 09/30/2015 12:27:54 Nicholas [name], [name] ([id number]) -------------------------------------------------------------------------------- Debridement Details Patient Name: Nicholas [name], [name] Date of Service: 09/30/2015 9:00 AM Medical Record Number: [id number] Patient Account Number: [id number] Date of Birth/Sex: 1941/03/17 (74 y.o. Male) Treating RN: Nicholas [name] Primary Care Physician: Nicholas [name] Other Clinician: Referring Physician: Arnette [name] Treating Physician/Extender: Nicholas [name] in Treatment: 1 Debridement Performed for Wound #1 Left,Plantar Toe Great Assessment: Performed By: Physician Nicholas [name] Debridement: Debridement Pre-procedure Yes Verification/Time Out Taken: Start Time: 09:30 Pain Control: Other : lidocaine 4% cream Level: Skin/Subcutaneous Tissue Total Area Debrided (L x 1.5 (cm) x 0.3 (cm) = 0.45 (cm) W): Tissue and other Viable, Non-Viable, Exudate, Fibrin/Slough, Subcutaneous material debrided: Instrument: Curette Bleeding: Minimum Hemostasis Achieved: Pressure End Time: 09:32 Procedural Pain: 0 Post Procedural Pain: 0 Response to Treatment: Procedure was tolerated well Post Debridement Measurements of Total Wound Length: (cm) 1.5 Width: (cm) 0.3 Depth: (cm) 0.2 Volume: (cm)  0.071 Post Procedure Diagnosis Same as Pre-procedure Electronic Signature(s) Signed: 09/30/2015 1:01:02 PM By: Loletha Grayer [name] Signed: 09/30/2015 4:52:55 PM By: Gretta Cool RN, BSN, Kim RN, BSN Entered By: Loletha Grayer on 09/30/2015 12:27:19 [name], [name] ([id number]) -------------------------------------------------------------------------------- Debridement Details Patient Name: Nicholas [name], [name] Date of Service: 09/30/2015 9:00 AM Medical Record Number: [id number] Patient Account Number: [id number] Date of Birth/Sex: [birth year] (74 y.o. Male) Treating RN: Nicholas [name] Primary Care Physician: Nicholas [name] Other Clinician: Referring Physician: Arnette [name] Treating Physician/Extender: Nicholas [name] in Treatment: 1 Debridement Performed for Wound #2 Left,Plantar Toe Second Assessment: Performed By: Physician Nicholas [name] Debridement: Debridement Pre-procedure Yes Verification/Time Out Taken: Start Time: 09:30 Pain Control: Other : lidocaine 4% cream Level: Skin/Subcutaneous Tissue Total Area Debrided (L x 0.3 (cm) x 0.5 (cm) = 0.15 (cm) W): Tissue and other Viable, Non-Viable, Exudate, Fat, Fibrin/Slough, Subcutaneous material debrided: Instrument: Curette Bleeding: Minimum Hemostasis Achieved: Pressure End Time: 09:32 Procedural Pain: 0 Post Procedural Pain: 0 Response to Treatment: Procedure was tolerated well Post Debridement Measurements of Total Wound Length: (cm) 0.3 Width: (cm) 0.5 Depth: (cm) 0.2 Volume: (cm) 0.024 Post Procedure Diagnosis Same as Pre-procedure Electronic Signature(s) Signed: 09/30/2015 1:01:02 PM By: Loletha Grayer [name] Signed: 09/30/2015 4:52:55 PM By: Gretta Cool RN, BSN, Kim RN, BSN Entered By: Loletha Grayer on 09/30/2015 12:27:36 [name], [name] ([id number]) -------------------------------------------------------------------------------- HPI Details Patient Name: Nicholas [name], [name] Date of Service: 09/30/2015  9:00 AM Medical Record Number: [id number] Patient Account Number: [id number] Date of Birth/Sex: [birth year] (74 y.o. Male) Treating RN: Nicholas [name] Primary Care Physician: Nicholas [name] Other Clinician: Referring Physician: Arnette [name] Treating Physician/Extender: Nicholas [name] in Treatment: 1 History of Present Illness HPI Description: Pleasant 74 year old with history of diabetes (hemoglobin A1c reportedly 6), peripheral neuropathy, and sleep apnea. No history of PAD. Left ABI 1.01. He developed blisters and subsequent ulcerations on the plantar aspect of his left first and second toe in early December 2016. Denies any trauma, unusual activity,  or new footwear. Has been fitted for orthotics in the past but did not like them. He is ambulating per his baseline without difficulty. No claudication or rest pain. 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 Started on doxycycline 09/22/2015. X-ray of left foot 09/23/2015 showed no evidence for osteomyelitis. Performing dressing changes with Prisma. Awaiting orthotics consultation at [facility]. He returns to clinic for follow-up and says that he developed a new blister and ulceration on the dorsum of his left foot, which he attributes to pressure from his shoes. No significant pain. No fever or chills. Minimal drainage. Electronic Signature(s) Signed: 09/30/2015 1:01:02 PM By: Loletha Grayer [name] Entered By: Loletha Grayer on 09/30/2015 12:30:46 Nicholas [name], [name] ([id number]) -------------------------------------------------------------------------------- Physical Exam Details Patient Name: Nicholas [name], [name] Date of Service: 09/30/2015 9:00 AM Medical Record Number: [id number] Patient Account Number: [id number] Date of Birth/Sex: 05-Jul-1941 (74 y.o. Male) Treating RN: Nicholas [name] Primary Care Physician: Nicholas [name] Other Clinician: Referring Physician: Arnette [name] Treating Physician/Extender: Nicholas III, Nicholas [name] Weeks in  Treatment: 1 Constitutional . Pulse regular. Respirations normal and unlabored. Afebrile. Marland Kitchen Respiratory WNL. No retractions.. Cardiovascular Pedal Pulses WNL. Integumentary (Hair, Skin) .Marland Kitchen Neurological . Psychiatric Judgement and insight Intact.. Oriented times 3.. No evidence of depression, anxiety, or agitation.. Notes Left first and second toe plantar ulcerations improved. Full-thickness. No exposed deep structures. Cellulitis resolved. Palpable DP. Left ABI 1.01. New partial thickness ulceration on the dorsum of the left foot. Covered with dry adherent fibrinous exudate. No evidence for infection. Electronic Signature(s) Signed: 09/30/2015 1:01:02 PM By: Loletha Grayer [name] Entered By: Loletha Grayer on 09/30/2015 12:31:55 Nicholas [name], [name] ([id number]) -------------------------------------------------------------------------------- Physician Orders Details Patient Name: Nicholas [name], [name] Date of Service: 09/30/2015 9:00 AM Medical Record Number: [id number] Patient Account Number: [id number] Date of Birth/Sex: 1941/03/29 (74 y.o. Male) Treating RN: Ahmed Prima Primary Care Physician: Nicholas [name] Other Clinician: Referring Physician: Arnette [name] Treating Physician/Extender: Nicholas [name] in Treatment: 1 Verbal / Phone Orders: No Diagnosis Coding Wound Cleansing Wound #1 Left,Plantar Toe Great o Clean wound with Normal Saline. Wound #2 Left,Plantar Toe Second o Clean wound with Normal Saline. Wound #3 Left,Midline,Posterior Foot o Clean wound with Normal Saline. Anesthetic Wound #1 Left,Plantar Toe Great o Topical Lidocaine 4% cream applied to wound bed prior to debridement Wound #2 Left,Plantar Toe Second o Topical Lidocaine 4% cream applied to wound bed prior to debridement Primary Wound Dressing Wound #1 Left,Plantar Toe Great o Aquacel Ag Wound #2 Left,Plantar Toe Second o Aquacel Ag Wound #3 Left,Midline,Posterior Foot o Other: -  Neosporin Secondary Dressing Wound #1 Left,Plantar Toe Great o Gauze and Kerlix/Conform Wound #2 Left,Plantar Toe Second o Gauze and Kerlix/Conform Wound #3 Left,Midline,Posterior Foot o Gauze and Kerlix/Conform - or bandaid Gabriel, Inri L. ([id number]) Dressing Change Frequency Wound #1 Left,Plantar Toe Great o Change dressing every day. Wound #2 Left,Plantar Toe Second o Change dressing every day. Wound #3 Left,Midline,Posterior Foot o Change dressing every day. Follow-up Appointments Wound #1 Left,Plantar Toe Great o Return Appointment in 1 week. Wound #2 Left,Plantar Toe Second o Return Appointment in 1 week. Wound #3 Left,Midline,Posterior Foot o Return Appointment in 1 week. Electronic Signature(s) Signed: 09/30/2015 1:01:02 PM By: Loletha Grayer [name] Signed: 09/30/2015 4:19:08 PM By: Alric Quan Entered By: Alric Quan on 09/30/2015 09:40:56 Yellen, Elya L. ([id number]) -------------------------------------------------------------------------------- Problem List Details Patient Name: Nicholas [name], [name] Date of Service: 09/30/2015 9:00 AM Medical Record Number: [id number] Patient Account Number: [id number] Date of Birth/Sex: 04-30-1941 (73 y.o. Male)  Treating RN: Nicholas [name] Primary Care Physician: Nicholas [name] Other Clinician: Referring Physician: Arnette [name] Treating Physician/Extender: Nicholas [name] in Treatment: 1 Active Problems ICD-10 Encounter Code Description Active Date Diagnosis E11.621 Type 2 diabetes mellitus with foot ulcer 09/23/2015 Yes S91.102D Unspecified open wound of left great toe without damage 09/23/2015 Yes to nail, subsequent encounter S91.105D Unspecified open wound of left lesser toe(s) without 09/23/2015 Yes damage to nail, subsequent encounter L03.032 Cellulitis of left toe 09/23/2015 Yes E11.40 Type 2 diabetes mellitus with diabetic neuropathy, 09/23/2015 Yes unspecified Inactive Problems Resolved  Problems Electronic Signature(s) Signed: 09/30/2015 1:01:02 PM By: Loletha Grayer [name] Entered By: Loletha Grayer on 09/30/2015 12:27:05 Licausi, Stevenson L. ([id number]) -------------------------------------------------------------------------------- Progress Note Details Patient Name: Nicholas [name], [name] Date of Service: 09/30/2015 9:00 AM Medical Record Number: [id number] Patient Account Number: [id number] Date of Birth/Sex: [birth year] (74 y.o. Male) Treating RN: Nicholas [name] Primary Care Physician: Nicholas [name] Other Clinician: Referring Physician: Arnette [name] Treating Physician/Extender: Nicholas [name] in Treatment: 1 Subjective Chief Complaint Information obtained from Patient Left toe ulcerations. New left foot ulcer. History of Present Illness (HPI) Pleasant 74 year old with history of diabetes (hemoglobin A1c reportedly 6), peripheral neuropathy, and sleep apnea. No history of PAD. Left ABI 1.01. He developed blisters and subsequent ulcerations on the plantar aspect of his left first and second toe in early December 2016. Denies any trauma, unusual activity, or new footwear. Has been fitted for orthotics in the past but did not like them. He is ambulating per his baseline without difficulty. No claudication or rest pain. 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 Started on doxycycline 09/22/2015. X-ray of left foot 09/23/2015 showed no evidence for osteomyelitis. Performing dressing changes with Prisma. Awaiting orthotics consultation at [facility]. He returns to clinic for follow-up and says that he developed a new blister and ulceration on the dorsum of his left foot, which he attributes to pressure from his shoes. No significant pain. No fever or chills. Minimal drainage. Objective Constitutional Pulse regular. Respirations normal and unlabored. Afebrile. Vitals Time Taken: 9:16 AM, Height: 72 in, Weight: 250 lbs, BMI: 33.9, Temperature: 97.8 F, Pulse:  86 bpm, Respiratory Rate: 18 breaths/min, Blood Pressure: 147/70 mmHg. Respiratory WNL. No retractions.. Cardiovascular Pedal Pulses WNL. Gauthreaux, Damaris L. ([id number]) Psychiatric Judgement and insight Intact.. Oriented times 3.. No evidence of depression, anxiety, or agitation.. General Notes: Left first and second toe plantar ulcerations improved. Full-thickness. No exposed deep structures. Cellulitis resolved. Palpable DP. Left ABI 1.01. New partial thickness ulceration on the dorsum of the left foot. Covered with dry adherent fibrinous exudate. No evidence for infection. Integumentary (Hair, Skin) Wound #1 status is Open. Original cause of wound was Blister. The wound is located on the [employer]. The wound measures 1.5cm length x 0.3cm width x 0.1cm depth; 0.353cm^2 area and 0.035cm^3 volume. The wound is limited to skin breakdown. There is a medium amount of serous drainage noted. The wound margin is flat and intact. There is small (1-33%) red granulation within the wound bed. There is a large (67-100%) amount of necrotic tissue within the wound bed including Eschar and Adherent Slough. The periwound skin appearance exhibited: Localized Edema, Erythema. The periwound skin appearance did not exhibit: Callus, Crepitus, Excoriation, Fluctuance, Friable, Induration, Rash, Scarring, Dry/Scaly, Maceration, Moist, Atrophie Blanche, Cyanosis, Ecchymosis, Hemosiderin Staining, Mottled, Pallor, Rubor. The surrounding wound skin color is noted with erythema which is circumferential. Periwound temperature was noted as No Abnormality. The periwound has tenderness on palpation. Wound #  2 status is Open. Original cause of wound was Blister. The wound is located on the Left,Plantar Toe Second. The wound measures 0.3cm length x 0.5cm width x 0.1cm depth; 0.118cm^2 area and 0.012cm^3 volume. The wound is limited to skin breakdown. There is a medium amount of serous drainage noted. The wound  margin is flat and intact. There is small (1-33%) red granulation within the wound bed. There is a large (67-100%) amount of necrotic tissue within the wound bed including Eschar and Adherent Slough. The periwound skin appearance exhibited: Erythema. The periwound skin appearance did not exhibit: Callus, Crepitus, Excoriation, Fluctuance, Friable, Induration, Localized Edema, Rash, Scarring, Dry/Scaly, Maceration, Moist, Atrophie Blanche, Cyanosis, Ecchymosis, Hemosiderin Staining, Mottled, Pallor, Rubor. The surrounding wound skin color is noted with erythema which is circumferential. Periwound temperature was noted as No Abnormality. The periwound has tenderness on palpation. Wound #3 status is Open. Original cause of wound was Blister. The wound is located on the [employer]. The wound measures 0.6cm length x 0.6cm width x 0.1cm depth; 0.283cm^2 area and 0.028cm^3 volume. The wound is limited to skin breakdown. There is no tunneling or undermining noted. There is a none present amount of drainage noted. The wound margin is flat and intact. There is large (67-100%) granulation within the wound bed. There is no necrotic tissue within the wound bed. The periwound skin appearance exhibited: Erythema. The periwound skin appearance did not exhibit: Dry/Scaly, Maceration, Moist. The surrounding wound skin color is noted with erythema which is circumferential. Assessment Active Problems ICD-10 Nicholas [name], [name] ([id number]) E11.621 - Type 2 diabetes mellitus with foot ulcer S91.102D - Unspecified open wound of left great toe without damage to nail, subsequent encounter S91.105D - Unspecified open wound of left lesser toe(s) without damage to nail, subsequent encounter L03.032 - Cellulitis of left toe E11.40 - Type 2 diabetes mellitus with diabetic neuropathy, unspecified Left first and second toe ulcerations, Wagner grade 1. New left dorsal foot ulceration. Procedures Wound #1 Wound  #1 is a Diabetic Wound/Ulcer of the Lower Extremity located on the Left,Plantar Toe Great . There was a Skin/Subcutaneous Tissue Debridement [id number]) debridement with total area of 0.45 sq cm performed by Nicholas [name]. with the following instrument(s): Curette to remove Viable and Non-Viable tissue/material including Exudate, Fibrin/Slough, and Subcutaneous after achieving pain control using Other (lidocaine 4% cream). A time out was conducted prior to the start of the procedure. A Minimum amount of bleeding was controlled with Pressure. The procedure was tolerated well with a pain level of 0 throughout and a pain level of 0 following the procedure. Post Debridement Measurements: 1.5cm length x 0.3cm width x 0.2cm depth; 0.071cm^3 volume. Post procedure Diagnosis Wound #1: Same as Pre-Procedure Wound #2 Wound #2 is a Diabetic Wound/Ulcer of the Lower Extremity located on the Left,Plantar Toe Second . There was a Skin/Subcutaneous Tissue Debridement [id number]) debridement with total area of 0.15 sq cm performed by Nicholas [name]. with the following instrument(s): Curette to remove Viable and Non-Viable tissue/material including Exudate, Fat, Fibrin/Slough, and Subcutaneous after achieving pain control using Other (lidocaine 4% cream). A time out was conducted prior to the start of the procedure. A Minimum amount of bleeding was controlled with Pressure. The procedure was tolerated well with a pain level of 0 throughout and a pain level of 0 following the procedure. Post Debridement Measurements: 0.3cm length x 0.5cm width x 0.2cm depth; 0.024cm^3 volume. Post procedure Diagnosis Wound #2: Same as Pre-Procedure Plan Wound Cleansing: Wound #  1 Left,Plantar Toe Great: Nicholas [name], [name] ([id number]) Clean wound with Normal Saline. Wound #2 Left,Plantar Toe Second: Clean wound with Normal Saline. Wound #3 Left,Midline,Posterior Foot: Clean wound with Normal  Saline. Anesthetic: Wound #1 Left,Plantar Toe Great: Topical Lidocaine 4% cream applied to wound bed prior to debridement Wound #2 Left,Plantar Toe Second: Topical Lidocaine 4% cream applied to wound bed prior to debridement Primary Wound Dressing: Wound #1 Left,Plantar Toe Great: Aquacel Ag Wound #2 Left,Plantar Toe Second: Aquacel Ag Wound #3 Left,Midline,Posterior Foot: Other: - Neosporin Secondary Dressing: Wound #1 Left,Plantar Toe Great: Gauze and Kerlix/Conform Wound #2 Left,Plantar Toe Second: Gauze and Kerlix/Conform Wound #3 Left,Midline,Posterior Foot: Gauze and Kerlix/Conform - or bandaid Dressing Change Frequency: Wound #1 Left,Plantar Toe Great: Change dressing every day. Wound #2 Left,Plantar Toe Second: Change dressing every day. Wound #3 Left,Midline,Posterior Foot: Change dressing every day. Follow-up Appointments: Wound #1 Left,Plantar Toe Great: Return Appointment in 1 week. Wound #2 Left,Plantar Toe Second: Return Appointment in 1 week. Wound #3 Left,Midline,Posterior Foot: Return Appointment in 1 week. Prisma dressing changes to toes. Neosporin to left dorsal foot ulcer. Complete doxycycline. [employer] consultation for new orthotics. Rhudy, Randol L. ([id number]) Electronic Signature(s) Signed: 09/30/2015 1:01:02 PM By: Loletha Grayer [name] Entered By: Loletha Grayer on 09/30/2015 12:32:52 Acoff, Jazion L. ([id number]) -------------------------------------------------------------------------------- SuperBill Details Patient Name: Stockman, Viyan L. Date of Service: 09/30/2015 Medical Record Number: [id number] Patient Account Number: [id number] Date of Birth/Sex: 09/17/1941 (74 y.o. Male) Treating RN: Nicholas [name] Primary Care Physician: Nicholas [name] Other Clinician: Referring Physician: Arnette [name] Treating Physician/Extender: Nicholas [name] in Treatment: 1 Diagnosis Coding ICD-10 Codes Code Description E11.621 Type 2 diabetes mellitus with  foot ulcer S91.102D Unspecified open wound of left great toe without damage to nail, subsequent encounter Unspecified open wound of left lesser toe(s) without damage to nail, subsequent S91.105D encounter L03.032 Cellulitis of left toe E11.40 Type 2 diabetes mellitus with diabetic neuropathy, unspecified Facility Procedures CPT4: Description Modifier Quantity Code [id number] - DEB SUBQ TISSUE 20 SQ CM/< 1 ICD-10 Description Diagnosis E11.621 Type 2 diabetes mellitus with foot ulcer S91.102D Unspecified open wound of left great toe without damage to nail, subsequent  encounter S91.105D Unspecified open wound of left lesser toe(s) without damage to nail, subsequent encounter Physician Procedures CPT4: Description Modifier Quantity Code [id number] - WC PHYS LEVEL 3 - EST PT 1 ICD-10 Description Diagnosis E11.621 Type 2 diabetes mellitus with foot ulcer CPT4: [id number] - WC PHYS SUBQ TISS 20 SQ CM 1 ICD-10 Description Diagnosis E11.621 Type 2 diabetes mellitus with foot ulcer S91.102D Unspecified open wound of left great toe without damage to nail, subsequent encounter Faniel, Klint L. ([id number]) Electronic Signature(s) Signed: 09/30/2015 1:01:02 PM By: Loletha Grayer [name] Entered By: Loletha Grayer on 09/30/2015 12:33:26

## 2015-10-07 ENCOUNTER — Encounter: Payer: Medicare Other | Admitting: Surgery

## 2015-10-07 DIAGNOSIS — S91102A Unspecified open wound of left great toe without damage to nail, initial encounter: Secondary | ICD-10-CM | POA: Diagnosis not present

## 2015-10-07 DIAGNOSIS — E1140 Type 2 diabetes mellitus with diabetic neuropathy, unspecified: Secondary | ICD-10-CM | POA: Diagnosis not present

## 2015-10-07 DIAGNOSIS — S91105D Unspecified open wound of left lesser toe(s) without damage to nail, subsequent encounter: Secondary | ICD-10-CM | POA: Diagnosis not present

## 2015-10-07 DIAGNOSIS — E11621 Type 2 diabetes mellitus with foot ulcer: Secondary | ICD-10-CM | POA: Diagnosis not present

## 2015-10-07 DIAGNOSIS — S91105A Unspecified open wound of left lesser toe(s) without damage to nail, initial encounter: Secondary | ICD-10-CM | POA: Diagnosis not present

## 2015-10-07 DIAGNOSIS — L03032 Cellulitis of left toe: Secondary | ICD-10-CM | POA: Diagnosis not present

## 2015-10-07 DIAGNOSIS — G4730 Sleep apnea, unspecified: Secondary | ICD-10-CM | POA: Diagnosis not present

## 2015-10-08 NOTE — Progress Notes (Signed)
Brendel, Aadi L. ([id number]) Visit Report for 10/07/2015 Arrival Information Details Patient Name: Nicholas [name], [name] Date of Service: 10/07/2015 2:00 PM Medical Record Number: [id number] Patient Account Number: [id number] Date of Birth/Sex: Nov 13, 1940 (74 y.o. Male) Treating RN: Montey Hora Primary Care Physician: Arnette Norris Other Clinician: Referring Physician: Arnette Norris Treating Physician/Extender: BURNS III, Charlean Sanfilippo in Treatment: 2 Visit Information History Since Last Visit Added or deleted any medications: No Patient Arrived: Ambulatory Any new allergies or adverse reactions: No Arrival Time: 13:59 Had a fall or experienced change in No Accompanied By: self activities of daily living that may affect Transfer Assistance: None risk of falls: Patient Identification Verified: Yes Signs or symptoms of abuse/neglect since last No Secondary Verification Process Yes visito Completed: Hospitalized since last visit: No Patient Has Alerts: Yes Pain Present Now: No Patient Alerts: DMII Electronic Signature(s) Signed: 10/07/2015 5:51:20 PM By: Montey Hora Entered By: Montey Hora on 10/07/2015 13:59:41 [place], [place]. ([id number]) -------------------------------------------------------------------------------- Encounter Discharge Information Details Patient Name: Nicholas [name], [name] Date of Service: 10/07/2015 2:00 PM Medical Record Number: [id number] Patient Account Number: [id number] Date of Birth/Sex: 1941/05/07 (74 y.o. Male) Treating RN: Montey Hora Primary Care Physician: Arnette Norris Other Clinician: Referring Physician: Arnette Norris Treating Physician/Extender: BURNS III, Charlean Sanfilippo in Treatment: 2 Encounter Discharge Information Items Discharge Pain Level: 0 Discharge Condition: Stable Ambulatory Status: Ambulatory Discharge Destination: Home Private Transportation: Auto Accompanied By: self Schedule Follow-up Appointment: Yes Medication Reconciliation  completed and No provided to Patient/Care Lakechia Nay: Clinical Summary of Care: Electronic Signature(s) Signed: 10/07/2015 2:44:11 PM By: Montey Hora Entered By: Montey Hora on 10/07/2015 14:44:11 Kotter, Jourden L. ([id number]) -------------------------------------------------------------------------------- Lower Extremity Assessment Details Patient Name: Nicholas [name], [name] Date of Service: 10/07/2015 2:00 PM Medical Record Number: [id number] Patient Account Number: [id number] Date of Birth/Sex: 01-Nov-1940 (74 y.o. Male) Treating RN: Montey Hora Primary Care Physician: Arnette Norris Other Clinician: Referring Physician: Arnette Norris Treating Physician/Extender: BURNS III, Charlean Sanfilippo in Treatment: 2 Edema Assessment Assessed: [Left: No] [Right: No] Edema: [Left: N] [Right: o] Vascular Assessment Pulses: Posterior Tibial Dorsalis Pedis Palpable: [Left:Yes] Extremity colors, hair growth, and conditions: Extremity Color: [Left:Normal] Hair Growth on Extremity: [Left:No] Temperature of Extremity: [Left:Warm] Capillary Refill: [Left:< 3 seconds] Electronic Signature(s) Signed: 10/07/2015 5:51:20 PM By: Montey Hora Entered By: Montey Hora on 10/07/2015 14:04:48 [place], Saige L. ([id number]) -------------------------------------------------------------------------------- Multi Wound Chart Details Patient Name: Nicholas [name], [name] Date of Service: 10/07/2015 2:00 PM Medical Record Number: [id number] Patient Account Number: [id number] Date of Birth/Sex: 1940-12-11 (74 y.o. 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, Other the Lower Extremity] [2:the Lower Extremity] Comorbid History: [1:Type II Diabetes, Osteoarthritis, Neuropathy Osteoarthritis, Neuropathy Osteoarthritis, Neuropathy] [2:Type II Diabetes,] [3:Type II Diabetes,] Date Acquired: [1:09/16/2015] [2:09/16/2015] [3:09/28/2015] Weeks of Treatment: [1:2] [2:2] [3:1] Wound Status: [1:Open] [2:Open] [3:Open] Measurements L x W x D 0.1x0.5x0.1 [2:0.2x0.5x0.1] [3:0.4x0.5x0.1] (cm) Area (cm) : [1:0.039] [2:0.079] [3:0.157] Volume (cm) : [1:0.004] [2:0.008] [3:0.016] % Reduction in Area: [1:92.90%] [2:91.60%] [3:44.50%] % Reduction in Volume: 92.70% [2:91.50%] [3:42.90%] Classification: [1:Grade 2] [2:Grade 2] [3:Partial Thickness] HBO Classification: [  1:N/A] [2:N/A] [3:Grade 0] Exudate Amount: [1:Medium] [2:Medium] [3:None Present] Exudate Type: [1:Serous] [2:Serous] [3:N/A] Exudate Color: [1:amber] [2:amber] [3:N/A] Wound Margin: [1:Flat and Intact] [2:Flat and Intact] [3:Flat and Intact] Granulation Amount: [1:Large (67-100%)] [2:Large (67-100%)] [3:Large (67-100%)] Granulation Quality: [1:Red] [2:Red] [3:N/A] Necrotic Amount: [1:Small (1-33%)] [2:Small (1-33%)] [3:None Present (0%)] Necrotic Tissue: [1:Eschar, Adherent Slough Eschar, Adherent Slough N/A] Exposed Structures: [1:Fascia: No Fat: No Tendon: No] [2:Fascia: No Fat: No Tendon: No] [3:Fascia: No Fat: No Tendon: No] Muscle: No Muscle: No Muscle: No Joint: No Joint: No Joint: No Bone: No Bone: No Bone: No Limited to Skin Limited to Skin Limited to Skin Breakdown Breakdown Breakdown Epithelialization: None None None Periwound Skin Texture: Edema: Yes Callus: Yes No Abnormalities Noted Callus: Yes Edema: No Excoriation: No Excoriation:  No Induration: No Induration: No Crepitus: No Crepitus: No Fluctuance: No Fluctuance: No Friable: No Friable: No Rash: No Rash: No Scarring: No Scarring: No Periwound Skin Maceration: No Maceration: No Maceration: No Moisture: Moist: No Moist: No Moist: No Dry/Scaly: No Dry/Scaly: No Dry/Scaly: No Periwound Skin Color: Erythema: Yes Erythema: Yes Erythema: Yes Atrophie Blanche: No Atrophie Blanche: No Cyanosis: No Cyanosis: No Ecchymosis: No Ecchymosis: No Hemosiderin Staining: No Hemosiderin Staining: No Mottled: No Mottled: No Pallor: No Pallor: No Rubor: No Rubor: No Erythema Location: Circumferential Circumferential Circumferential Temperature: No Abnormality No Abnormality N/A Tenderness on Yes Yes No Palpation: Wound Preparation: Ulcer Cleansing: Ulcer Cleansing: Ulcer Cleansing: Rinsed/Irrigated with Rinsed/Irrigated with Rinsed/Irrigated with Saline Saline Saline Topical Anesthetic Topical Anesthetic Topical Anesthetic Applied: Other: lidocaine Applied: Other: lidocaine Applied: None 4% 4% Treatment Notes Electronic Signature(s) Signed: 10/07/2015 5:51:20 PM By: Montey Hora Entered By: Montey Hora on 10/07/2015 14:13:12 Nicholas [name], [name] ([id number]) -------------------------------------------------------------------------------- [place] Details Patient Name: Haze, Archie L. Date of Service: 10/07/2015 2:00 PM Medical Record Number: [id number] Patient Account Number: [id number] Date of Birth/Sex: May 13, 1941 (74 y.o. Male) Treating RN: Montey Hora Primary Care Physician: Arnette Norris Other Clinician: Referring Physician: Arnette Norris Treating Physician/Extender: BURNS III, Charlean Sanfilippo in Treatment: 2 Active Inactive Orientation to the Wound Care Program Nursing Diagnoses: Knowledge deficit related to the wound healing center program Goals: Patient/caregiver will verbalize understanding of the [place]  Program Date Initiated: 09/23/2015 Goal Status: Active Interventions: Provide education on orientation to the wound center Notes: Wound/Skin Impairment Nursing Diagnoses: Impaired tissue integrity Knowledge deficit related to ulceration/compromised skin integrity Goals: Patient/caregiver will verbalize understanding of skin care regimen Date Initiated: 09/23/2015 Goal Status: Active Ulcer/skin breakdown will have a volume reduction of 30% by week 4 Date Initiated: 09/23/2015 Goal Status: Active Ulcer/skin breakdown will have a volume reduction of 50% by week 8 Date Initiated: 09/23/2015 Goal Status: Active Ulcer/skin breakdown will have a volume reduction of 80% by week 12 Date Initiated: 09/23/2015 Goal Status: Active Ulcer/skin breakdown will heal within 14 weeks Date Initiated: 09/23/2015 Nicholas [name], [name] ([id number]) Goal Status: Active Interventions: Assess patient/caregiver ability to perform ulcer/skin care regimen upon admission and as needed Assess ulceration(s) every visit Notes: Electronic Signature(s) Signed: 10/07/2015 5:51:20 PM By: Montey Hora Entered By: Montey Hora on 10/07/2015 14:13:00 Nicholas [name], [name] ([id number]) -------------------------------------------------------------------------------- Patient/Caregiver Education Details Patient Name: Nicholas [name], [name] Date of Service: 10/07/2015 2:00 PM Medical Record Number: [id number] Patient Account Number: [id number] Date of Birth/Gender: 10/31/1940 (74 y.o. Male) Treating RN: Montey Hora Primary Care Physician: Arnette Norris Other Clinician: Referring Physician: Arnette Norris Treating Physician/Extender: BURNS III, Charlean Sanfilippo in Treatment: 2 Education Assessment Education Provided To: Patient Education Topics Provided Wound/Skin Impairment: Handouts:  Other: wound care as ordered Methods: Demonstration, Explain/Verbal Responses: State content correctly Electronic Signature(s) Signed: 10/07/2015  2:44:26 PM By: Montey Hora Entered By: Montey Hora on 10/07/2015 14:44:26 Nicholas [name], [name] ([id number]) -------------------------------------------------------------------------------- Wound Assessment Details Patient Name: Nicholas [name], [name] Date of Service: 10/07/2015 2:00 PM Medical Record Number: [id number] Patient Account Number: [id number] Date of Birth/Sex: April 24, 1941 (74 y.o. Male) Treating RN: Montey Hora Primary Care Physician: Arnette Norris Other Clinician: Referring Physician: Arnette Norris Treating Physician/Extender: BURNS III, Charlean Sanfilippo in Treatment: 2 Wound Status Wound Number: 1 Primary Diabetic Wound/Ulcer of the Lower Etiology: Extremity Wound Location: Left Toe Great - Plantar Wound Status: Open Wounding Event: Blister Comorbid Type II Diabetes, Osteoarthritis, Date Acquired: 09/16/2015 History: Neuropathy Weeks Of Treatment: 2 Clustered Wound: No Photos Photo Uploaded By: Montey Hora on 10/07/2015 16:46:14 Wound Measurements Length: (cm) 0.1 Width: (cm) 0.5 Depth: (cm) 0.1 Area: (cm) 0.039 Volume: (cm) 0.004 % Reduction in Area: 92.9% % Reduction in Volume: 92.7% Epithelialization: None Tunneling: No Undermining: No Wound Description Classification: Grade 2 Wound Margin: Flat and Intact Exudate Amount: Medium Exudate Type: Serous Exudate Color: amber Foul Odor After Cleansing: No Wound Bed Granulation Amount: Large (67-100%) Exposed Structure Granulation Quality: Red Fascia Exposed: No Necrotic Amount: Small (1-33%) Fat Layer Exposed: No Necrotic Quality: Eschar, Adherent Slough Tendon Exposed: No Perdomo, Garek L. ([id number]) Muscle Exposed: No Joint Exposed: No Bone Exposed: No Limited to Skin Breakdown Periwound Skin Texture Texture Color No Abnormalities Noted: No No Abnormalities Noted: No Callus: Yes Atrophie Blanche: No Crepitus: No Cyanosis: No Excoriation: No Ecchymosis: No Fluctuance: No Erythema: Yes Friable:  No Erythema Location: Circumferential Induration: No Hemosiderin Staining: No Localized Edema: Yes Mottled: No Rash: No Pallor: No Scarring: No Rubor: No Moisture Temperature / Pain No Abnormalities Noted: No Temperature: No Abnormality Dry / Scaly: No Tenderness on Palpation: Yes Maceration: No Moist: No Wound Preparation Ulcer Cleansing: Rinsed/Irrigated with Saline Topical Anesthetic Applied: Other: lidocaine 4%, Treatment Notes Wound #1 (Left, Plantar Toe Great) 1. Cleansed with: Clean wound with Normal Saline 2. Anesthetic Topical Lidocaine 4% cream to wound bed prior to debridement 4. Dressing Applied: Promogran 5. Secondary Dressing Applied Gauze and Kerlix/Conform 7. Secured with Recruitment consultant) Signed: 10/07/2015 5:51:20 PM By: Montey Hora Entered By: Montey Hora on 10/07/2015 14:07:49 Nicholas [name], [name] ([id number]) -------------------------------------------------------------------------------- Wound Assessment Details Patient Name: Nicholas [name], [name] Date of Service: 10/07/2015 2:00 PM Medical Record Number: [id number] Patient Account Number: [id number] Date of Birth/Sex: [birth year] (74 y.o. Male) Treating RN: Montey Hora Primary Care Physician: Arnette Norris Other Clinician: Referring Physician: Arnette Norris Treating Physician/Extender: BURNS III, Charlean Sanfilippo in Treatment: 2 Wound Status Wound Number: 2 Primary Diabetic Wound/Ulcer of the Lower Etiology: Extremity Wound Location: Left Toe Second - Plantar Wound Status: Open Wounding Event: Blister Comorbid Type II Diabetes, Osteoarthritis, Date Acquired: 09/16/2015 History: Neuropathy Weeks Of Treatment: 2 Clustered Wound: No Photos Photo Uploaded By: Montey Hora on 10/07/2015 16:46:15 Wound Measurements Length: (cm) 0.2 Width: (cm) 0.5 Depth: (cm) 0.1 Area: (cm) 0.079 Volume: (cm) 0.008 % Reduction in Area: 91.6% % Reduction in Volume: 91.5% Epithelialization:  None Tunneling: No Undermining: No Wound Description Classification: Grade 2 Wound Margin: Flat and Intact Exudate Amount: Medium Exudate Type: Serous Exudate Color: amber Foul Odor After Cleansing: No Wound Bed Granulation Amount: Large (67-100%) Exposed Structure Granulation Quality: Red Fascia Exposed: No Necrotic Amount: Small (1-33%) Fat Layer Exposed: No Necrotic Quality: Eschar, Adherent Slough Tendon Exposed: No Nicholas [name], [name] ([id number]) Muscle Exposed: No Joint Exposed: No  Bone Exposed: No Limited to Skin Breakdown Periwound Skin Texture Texture Color No Abnormalities Noted: No No Abnormalities Noted: No Callus: Yes Atrophie Blanche: No Crepitus: No Cyanosis: No Excoriation: No Ecchymosis: No Fluctuance: No Erythema: Yes Friable: No Erythema Location: Circumferential Induration: No Hemosiderin Staining: No Localized Edema: No Mottled: No Rash: No Pallor: No Scarring: No Rubor: No Moisture Temperature / Pain No Abnormalities Noted: No Temperature: No Abnormality Dry / Scaly: No Tenderness on Palpation: Yes Maceration: No Moist: No Wound Preparation Ulcer Cleansing: Rinsed/Irrigated with Saline Topical Anesthetic Applied: Other: lidocaine 4%, Treatment Notes Wound #2 (Left, Plantar Toe Second) 1. Cleansed with: Clean wound with Normal Saline 2. Anesthetic Topical Lidocaine 4% cream to wound bed prior to debridement 4. Dressing Applied: Promogran 5. Secondary Dressing Applied Gauze and Kerlix/Conform 7. Secured with Recruitment consultant) Signed: 10/07/2015 5:51:20 PM By: Montey Hora Entered By: Montey Hora on 10/07/2015 14:08:03 Nicholas [name], Kanav L. ([id number]) -------------------------------------------------------------------------------- Wound Assessment Details Patient Name: Burkman, Jhovanny L. Date of Service: 10/07/2015 2:00 PM Medical Record Number: [id number] Patient Account Number: [id number] Date of Birth/Sex: [birth year]  (74 y.o. Male) Treating RN: Montey Hora Primary Care Physician: Arnette Norris Other Clinician: Referring Physician: Arnette Norris Treating Physician/Extender: BURNS III, Charlean Sanfilippo in Treatment: 2 Wound Status Wound Number: 3 Primary Trauma, Other Etiology: Wound Location: Left Foot - Midline, Posterior Wound Status: Open Wounding Event: Blister Comorbid Type II Diabetes, Osteoarthritis, Date Acquired: 09/28/2015 History: Neuropathy Weeks Of Treatment: 1 Clustered Wound: No Photos Photo Uploaded By: Montey Hora on 10/07/2015 16:45:25 Wound Measurements Length: (cm) 0.4 Width: (cm) 0.5 Depth: (cm) 0.1 Area: (cm) 0.157 Volume: (cm) 0.016 % Reduction in Area: 44.5% % Reduction in Volume: 42.9% Epithelialization: None Tunneling: No Undermining: No Wound Description Classification: Partial Thickness Foul Odor Diabetic Severity Earleen Newport): Grade 0 Wound Margin: Flat and Intact Exudate Amount: None Present After Cleansing: No Wound Bed Granulation Amount: Large (67-100%) Exposed Structure Necrotic Amount: None Present (0%) Fascia Exposed: No Fat Layer Exposed: No Tendon Exposed: No Muscle Exposed: No Pastorino, Kamuela L. ([id number]) Joint Exposed: No Bone Exposed: No Limited to Skin Breakdown Periwound Skin Texture Texture Color No Abnormalities Noted: No No Abnormalities Noted: No Erythema: Yes Moisture Erythema Location: Circumferential No Abnormalities Noted: No Dry / Scaly: No Maceration: No Moist: No Wound Preparation Ulcer Cleansing: Rinsed/Irrigated with Saline Topical Anesthetic Applied: None Treatment Notes Wound #3 (Left, Midline, Posterior Foot) 1. Cleansed with: Clean wound with Normal Saline 4. Dressing Applied: Other dressing (specify in notes) 5. Secondary Dressing Applied Gauze and Kerlix/Conform 7. Secured with Tape Notes Neosporin Electronic Signature(s) Signed: 10/07/2015 5:51:20 PM By: Montey Hora Entered By: Montey Hora  on 10/07/2015 14:08:15 Whitehouse, Rhian L. ([id number]) -------------------------------------------------------------------------------- Vitals Details Patient Name: Roache, Calyn L. Date of Service: 10/07/2015 2:00 PM Medical Record Number: [id number] Patient Account Number: [id number] Date of Birth/Sex: 1941/10/05 (74 y.o. Male) Treating RN: Montey Hora Primary Care Physician: Arnette Norris Other Clinician: Referring Physician: Arnette Norris Treating Physician/Extender: BURNS III, Charlean Sanfilippo in Treatment: 2 Vital Signs Time Taken: 14:00 Temperature (F): 98.0 Height (in): 72 Pulse (bpm): 83 Weight (lbs): 250 Respiratory Rate (breaths/min): 18 Body Mass Index (BMI): 33.9 Blood Pressure (mmHg): 154/72 Reference Range: 80 - 120 mg / dl Electronic Signature(s) Signed: 10/07/2015 5:51:20 PM By: Montey Hora Entered By: Montey Hora on 10/07/2015 14:00:32

## 2015-10-08 NOTE — Progress Notes (Signed)
Stegner, Carmel L. ([id number]) Visit Report for 10/07/2015 Chief Complaint Document Details Patient Name: Nicholas [name], [name] Date of Service: 10/07/2015 2:00 PM Medical Record Number: [id number] Patient Account Number: [id number] Date of Birth/Sex: 1941-01-09 (74 y.o. Male) Treating RN: Montey Hora Primary Care Physician: Arnette Norris Other Clinician: Referring Physician: Arnette Norris Treating Physician/Extender: BURNS III, Charlean Sanfilippo in Treatment: 2 Information Obtained from: Patient Chief Complaint Left toe ulcerations. Electronic Signature(s) Signed: 10/07/2015 4:23:19 PM By: Loletha Grayer MD Entered By: Loletha Grayer on 10/07/2015 15:29:08 Mcbrien, Nisaiah L. ([id number]) -------------------------------------------------------------------------------- Debridement Details Patient Name: Nicholas [name], [name] Date of Service: 10/07/2015 2:00 PM Medical Record Number: [id number] Patient Account Number: [id number] Date of Birth/Sex: June 07, 1941 (74 y.o. Male) Treating RN: Montey Hora Primary Care Physician: Arnette Norris Other Clinician: Referring Physician: Arnette Norris Treating Physician/Extender: BURNS III, Charlean Sanfilippo in Treatment: 2 Debridement Performed for Wound #1 Left,Plantar Toe Great Assessment: Performed By: Physician BURNS III, Teressa Senter., MD Debridement: Open Wound/Selective Debridement Selective Description: Pre-procedure Yes Verification/Time Out Taken: Start Time: 14:13 Pain Control: Lidocaine 4% Topical Solution Level: Non-Viable Tissue Total Area Debrided (L x 0.1 (cm) x 0.5 (cm) = 0.05 (cm) W): Tissue and other Non-Viable, Callus, Fibrin/Slough material debrided: Instrument: Curette Bleeding: Minimum Hemostasis Achieved: Pressure End Time: 14:14 Procedural Pain: 0 Post Procedural Pain: 0 Response to Treatment: Procedure was tolerated well Post Debridement Measurements of Total Wound Length: (cm) 0.1 Width: (cm) 0.5 Depth: (cm) 0.2 Volume: (cm)  0.008 Post Procedure Diagnosis Same as Pre-procedure Electronic Signature(s) Signed: 10/07/2015 4:23:19 PM By: Loletha Grayer MD Signed: 10/07/2015 5:51:20 PM By: Montey Hora Entered By: Loletha Grayer on 10/07/2015 15:28:17 Nicholas [name], [name] ([id number]) -------------------------------------------------------------------------------- Debridement Details Patient Name: Nicholas [name], [name] Date of Service: 10/07/2015 2:00 PM Medical Record Number: [id number] Patient Account Number: [id number] Date of Birth/Sex: Jul 26, 1941 (74 y.o. Male) Treating RN: Montey Hora Primary Care Physician: Arnette Norris Other Clinician: Referring Physician: Arnette Norris Treating Physician/Extender: BURNS III, Charlean Sanfilippo in Treatment: 2 Debridement Performed for Wound #2 Left,Plantar Toe Second Assessment: Performed By: Physician BURNS III, Teressa Senter., MD Debridement: Open Wound/Selective Debridement Selective Description: Pre-procedure Yes Verification/Time Out Taken: Start Time: 14:14 Pain Control: Lidocaine 4% Topical Solution Total Area Debrided (L x 0.2 (cm) x 0.5 (cm) = 0.1 (cm) W): Tissue and other Viable, Non-Viable, Callus, Fat, Fibrin/Slough, Subcutaneous material debrided: Instrument: Curette Bleeding: Minimum Hemostasis Achieved: Pressure End Time: 14:15 Procedural Pain: 0 Post Procedural Pain: 0 Response to Treatment: Procedure was tolerated well Post Debridement Measurements of Total Wound Length: (cm) 0.2 Width: (cm) 0.5 Depth: (cm) 0.2 Volume: (cm) 0.016 Post Procedure Diagnosis Same as Pre-procedure Electronic Signature(s) Signed: 10/07/2015 4:23:19 PM By: Loletha Grayer MD Signed: 10/07/2015 5:51:20 PM By: Montey Hora Entered By: Loletha Grayer on 10/07/2015 15:28:54 Nicholas [name], [name] ([id number]) -------------------------------------------------------------------------------- HPI Details Patient Name: Nicholas [name], [name] Date of Service: 10/07/2015 2:00  PM Medical Record Number: [id number] Patient Account Number: [id number] Date of Birth/Sex: 1940-11-09 (74 y.o. Male) Treating RN: Montey Hora Primary Care Physician: Arnette Norris Other Clinician: Referring Physician: Arnette Norris Treating Physician/Extender: BURNS III, Charlean Sanfilippo in Treatment: 2 History of Present Illness HPI Description: Pleasant 74 year old with history of diabetes (hemoglobin A1c reportedly 6), peripheral neuropathy, and sleep apnea. No history of PAD. Left ABI 1.01. He developed blisters and subsequent ulcerations on the plantar aspect of his left Nicholas [name] and second toe in early December 2016. Denies any trauma, unusual activity, or new footwear. Has been fitted for orthotics in the past  but did not like them. He is ambulating per his baseline without difficulty. No claudication or rest pain. 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 Started on doxycycline 09/22/2015. X-ray of left foot 09/23/2015 showed no evidence for osteomyelitis. Performing dressing changes with Prisma. Awaiting orthotics consultation at [facility]. He returns to clinic for follow-up and is without new complaints. No significant pain. No fever or chills. Minimal drainage. Electronic Signature(s) Signed: 10/07/2015 4:23:19 PM By: Loletha Grayer MD Entered By: Loletha Grayer on 10/07/2015 15:29:36 Stansbury, Radford L. ([id number]) -------------------------------------------------------------------------------- Physical Exam Details Patient Name: Nicholas [name], [name] Date of Service: 10/07/2015 2:00 PM Medical Record Number: [id number] Patient Account Number: [id number] Date of Birth/Sex: 1941/09/27 (74 y.o. Male) Treating RN: Montey Hora Primary Care Physician: Arnette Norris Other Clinician: Referring Physician: Arnette Norris Treating Physician/Extender: BURNS III, WALTER Weeks in Treatment: 2 Constitutional . Pulse regular. Respirations normal and unlabored. Afebrile. . Notes Left  Nicholas [name] and second toe plantar ulcerations improved. Full-thickness. No exposed deep structures. Cellulitis resolved. Palpable DP. Left ABI 1.01. New partial thickness ulceration on the dorsum of the left foot. Covered with dry adherent fibrinous exudate. No evidence for infection. Electronic Signature(s) Signed: 10/07/2015 4:23:19 PM By: Loletha Grayer MD Entered By: Loletha Grayer on 10/07/2015 15:29:57 Nicholas [name], [name] ([id number]) -------------------------------------------------------------------------------- Physician Orders Details Patient Name: Nicholas [name], [name] Date of Service: 10/07/2015 2:00 PM Medical Record Number: [id number] Patient Account Number: [id number] Date of Birth/Sex: 12-26-1940 (74 y.o. Male) Treating RN: Montey Hora Primary Care Physician: Arnette Norris Other Clinician: Referring Physician: Arnette Norris Treating Physician/Extender: BURNS III, Charlean Sanfilippo in Treatment: 2 Verbal / Phone Orders: Yes Clinician: Montey Hora Read Back and Verified: Yes Diagnosis Coding Wound Cleansing Wound #1 Left,Plantar Toe Great o Clean wound with Normal Saline. Wound #2 Left,Plantar Toe Second o Clean wound with Normal Saline. Wound #3 Left,Midline,Posterior Foot o Clean wound with Normal Saline. Anesthetic Wound #1 Left,Plantar Toe Great o Topical Lidocaine 4% cream applied to wound bed prior to debridement Wound #2 Left,Plantar Toe Second o Topical Lidocaine 4% cream applied to wound bed prior to debridement Primary Wound Dressing Wound #1 Left,Plantar Toe Great o Promogran Wound #2 Left,Plantar Toe Second o Promogran Wound #3 Left,Midline,Posterior Foot o Other: - Neosporin Secondary Dressing Wound #1 Left,Plantar Toe Great o Gauze and Kerlix/Conform Wound #2 Left,Plantar Toe Second o Gauze and Kerlix/Conform Wound #3 Left,Midline,Posterior Foot o Gauze and Kerlix/Conform - or bandaid Buitron, Nesanel L. ([id number]) Dressing Change  Frequency Wound #1 Left,Plantar Toe Great o Change dressing every day. Wound #2 Left,Plantar Toe Second o Change dressing every day. Wound #3 Left,Midline,Posterior Foot o Change dressing every day. Follow-up Appointments Wound #1 Left,Plantar Toe Great o Return Appointment in 1 week. Wound #2 Left,Plantar Toe Second o Return Appointment in 1 week. Wound #3 Left,Midline,Posterior Foot o Return Appointment in 1 week. Electronic Signature(s) Signed: 10/07/2015 4:23:19 PM By: Loletha Grayer MD Signed: 10/07/2015 5:51:20 PM By: Montey Hora Entered By: Montey Hora on 10/07/2015 14:15:50 Tancredi, Jerame L. ([id number]) -------------------------------------------------------------------------------- Problem List Details Patient Name: Nicholas [name], Berish L. Date of Service: 10/07/2015 2:00 PM Medical Record Number: [id number] Patient Account Number: [id number] Date of Birth/Sex: 06/14/1941 (74 y.o. Male) Treating RN: Montey Hora Primary Care Physician: Arnette Norris Other Clinician: Referring Physician: Arnette Norris Treating Physician/Extender: BURNS III, Charlean Sanfilippo in Treatment: 2 Active Problems ICD-10 Encounter Code Description Active Date Diagnosis E11.621 Type 2 diabetes mellitus with foot ulcer 09/23/2015 Yes S91.105D Unspecified open wound of left lesser toe(s) without 09/23/2015 Yes  damage to nail, subsequent encounter L03.032 Cellulitis of left toe 09/23/2015 Yes E11.40 Type 2 diabetes mellitus with diabetic neuropathy, 09/23/2015 Yes unspecified Inactive Problems Resolved Problems ICD-10 Code Description Active Date Resolved Date S91.102D Unspecified open wound of left great toe without damage 09/23/2015 09/23/2015 to nail, subsequent encounter Electronic Signature(s) Signed: 10/07/2015 4:23:19 PM By: Loletha Grayer MD Entered By: Loletha Grayer on 10/07/2015 15:27:09 Nicholas [name], [name]  ([id number]) -------------------------------------------------------------------------------- Progress Note Details Patient Name: Nicholas [name], [name] Date of Service: 10/07/2015 2:00 PM Medical Record Number: [id number] Patient Account Number: [id number] Date of Birth/Sex: 06-18-1941 (74 y.o. Male) Treating RN: Montey Hora Primary Care Physician: Arnette Norris Other Clinician: Referring Physician: Arnette Norris Treating Physician/Extender: BURNS III, Charlean Sanfilippo in Treatment: 2 Subjective Chief Complaint Information obtained from Patient Left toe ulcerations. History of Present Illness (HPI) Pleasant 74 year old with history of diabetes (hemoglobin A1c reportedly 6), peripheral neuropathy, and sleep apnea. No history of PAD. Left ABI 1.01. He developed blisters and subsequent ulcerations on the plantar aspect of his left Nicholas [name] and second toe in early December 2016. Denies any trauma, unusual activity, or new footwear. Has been fitted for orthotics in the past but did not like them. He is ambulating per his baseline without difficulty. No claudication or rest pain. 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 Started on doxycycline 09/22/2015. X-ray of left foot 09/23/2015 showed no evidence for osteomyelitis. Performing dressing changes with Prisma. Awaiting orthotics consultation at [facility]. He returns to clinic for follow-up and is without new complaints. No significant pain. No fever or chills. Minimal drainage. Objective Constitutional Pulse regular. Respirations normal and unlabored. Afebrile. Vitals Time Taken: 2:00 PM, Height: 72 in, Weight: 250 lbs, BMI: 33.9, Temperature: 98.0 F, Pulse: 83 bpm, Respiratory Rate: 18 breaths/min, Blood Pressure: 154/72 mmHg. General Notes: Left Nicholas [name] and second toe plantar ulcerations improved. Full-thickness. No exposed deep structures. Cellulitis resolved. Palpable DP. Left ABI 1.01. New partial thickness ulceration on the dorsum  of the left foot. Covered with dry adherent fibrinous exudate. No evidence for infection. Integumentary (Hair, Skin) Nicholas [name], [name] ([id number]) Wound #1 status is Open. Original cause of wound was Blister. The wound is located on the [employer]. The wound measures 0.1cm length x 0.5cm width x 0.1cm depth; 0.039cm^2 area and 0.004cm^3 volume. The wound is limited to skin breakdown. There is no tunneling or undermining noted. There is a medium amount of serous drainage noted. The wound margin is flat and intact. There is large (67-100%) red granulation within the wound bed. There is a small (1-33%) amount of necrotic tissue within the wound bed including Eschar and Adherent Slough. The periwound skin appearance exhibited: Callus, Localized Edema, Erythema. The periwound skin appearance did not exhibit: Crepitus, Excoriation, Fluctuance, Friable, Induration, Rash, Scarring, Dry/Scaly, Maceration, Moist, Atrophie Blanche, Cyanosis, Ecchymosis, Hemosiderin Staining, Mottled, Pallor, Rubor. The surrounding wound skin color is noted with erythema which is circumferential. Periwound temperature was noted as No Abnormality. The periwound has tenderness on palpation. Wound #2 status is Open. Original cause of wound was Blister. The wound is located on the Left,Plantar Toe Second. The wound measures 0.2cm length x 0.5cm width x 0.1cm depth; 0.079cm^2 area and 0.008cm^3 volume. The wound is limited to skin breakdown. There is no tunneling or undermining noted. There is a medium amount of serous drainage noted. The wound margin is flat and intact. There is large (67-100%) red granulation within the wound bed. There is a small (1-33%) amount of necrotic tissue within the wound  bed including Eschar and Adherent Slough. The periwound skin appearance exhibited: Callus, Erythema. The periwound skin appearance did not exhibit: Crepitus, Excoriation, Fluctuance, Friable, Induration, Localized Edema,  Rash, Scarring, Dry/Scaly, Maceration, Moist, Atrophie Blanche, Cyanosis, Ecchymosis, Hemosiderin Staining, Mottled, Pallor, Rubor. The surrounding wound skin color is noted with erythema which is circumferential. Periwound temperature was noted as No Abnormality. The periwound has tenderness on palpation. Wound #3 status is Open. Original cause of wound was Blister. The wound is located on the [place]. The wound measures 0.4cm length x 0.5cm width x 0.1cm depth; 0.157cm^2 area and 0.016cm^3 volume. The wound is limited to skin breakdown. There is no tunneling or undermining noted. There is a none present amount of drainage noted. The wound margin is flat and intact. There is large (67-100%) granulation within the wound bed. There is no necrotic tissue within the wound bed. The periwound skin appearance exhibited: Erythema. The periwound skin appearance did not exhibit: Dry/Scaly, Maceration, Moist. The surrounding wound skin color is noted with erythema which is circumferential. Assessment Active Problems ICD-10 E11.621 - Type 2 diabetes mellitus with foot ulcer S91.105D - Unspecified open wound of left lesser toe(s) without damage to nail, subsequent encounter L03.032 - Cellulitis of left toe E11.40 - Type 2 diabetes mellitus with diabetic neuropathy, unspecified Nicholas [name], [name] ([id number]) Left Nicholas [name] and second toe ulcerations almost healed. Procedures Wound #1 Wound #1 is a Diabetic Wound/Ulcer of the Lower Extremity located on the Left,Plantar Toe Great . There was a Non-Viable Tissue Open Wound/Selective [phone] debridement with total area of 0.05 sq cm performed by BURNS III, Teressa Senter., MD. with the following instrument(s): Curette to remove Non-Viable tissue/material including Fibrin/Slough and Callus after achieving pain control using Lidocaine 4% Topical Solution. A time out was conducted prior to the start of the procedure. A Minimum amount of bleeding  was controlled with Pressure. The procedure was tolerated well with a pain level of 0 throughout and a pain level of 0 following the procedure. Post Debridement Measurements: 0.1cm length x 0.5cm width x 0.2cm depth; 0.008cm^3 volume. Post procedure Diagnosis Wound #1: Same as Pre-Procedure Wound #2 Wound #2 is a Diabetic Wound/Ulcer of the Lower Extremity located on the Left,Plantar Toe Second . There was an Open Wound debridement with total area of 0.1 sq cm performed by BURNS III, Teressa Senter., MD. with the following instrument(s): Curette to remove Viable and Non-Viable tissue/material including Fat, Fibrin/Slough, Callus, and Subcutaneous after achieving pain control using Lidocaine 4% Topical Solution. A time out was conducted prior to the start of the procedure. A Minimum amount of bleeding was controlled with Pressure. The procedure was tolerated well with a pain level of 0 throughout and a pain level of 0 following the procedure. Post Debridement Measurements: 0.2cm length x 0.5cm width x 0.2cm depth; 0.016cm^3 volume. Post procedure Diagnosis Wound #2: Same as Pre-Procedure Plan Wound Cleansing: Wound #1 Left,Plantar Toe Great: Clean wound with Normal Saline. Wound #2 Left,Plantar Toe Second: Clean wound with Normal Saline. Wound #3 Left,Midline,Posterior Foot: Clean wound with Normal Saline. Anesthetic: Wound #1 Left,Plantar Toe Great: Topical Lidocaine 4% cream applied to wound bed prior to debridement Wound #2 Left,Plantar Toe Second: Topical Lidocaine 4% cream applied to wound bed prior to debridement Primary Wound Dressing: Wound #1 Left,Plantar Toe Great: Promogran Petkus, Brexton L. ([id number]) Wound #2 Left,Plantar Toe Second: Promogran Wound #3 Left,Midline,Posterior Foot: Other: - Neosporin Secondary Dressing: Wound #1 Left,Plantar Toe Great: Gauze and Kerlix/Conform Wound #2 Left,Plantar Toe Second: Gauze and Kerlix/Conform  Wound #3 Left,Midline,Posterior  Foot: Gauze and Kerlix/Conform - or bandaid Dressing Change Frequency: Wound #1 Left,Plantar Toe Great: Change dressing every day. Wound #2 Left,Plantar Toe Second: Change dressing every day. Wound #3 Left,Midline,Posterior Foot: Change dressing every day. Follow-up Appointments: Wound #1 Left,Plantar Toe Great: Return Appointment in 1 week. Wound #2 Left,Plantar Toe Second: Return Appointment in 1 week. Wound #3 Left,Midline,Posterior Foot: Return Appointment in 1 week. Collagen dressings. Continue offloading measures. Electronic Signature(s) Signed: 10/07/2015 4:23:19 PM By: Loletha Grayer MD Entered By: Loletha Grayer on 10/07/2015 15:30:30 Giza, Clete L. ([id number]) -------------------------------------------------------------------------------- SuperBill Details Patient Name: Zurcher, Merlon L. Date of Service: 10/07/2015 Medical Record Number: [id number] Patient Account Number: [id number] Date of Birth/Sex: October 18, 1940 (74 y.o. Male) Treating RN: Montey Hora Primary Care Physician: Arnette Norris Other Clinician: Referring Physician: Arnette Norris Treating Physician/Extender: BURNS III, Charlean Sanfilippo in Treatment: 2 Diagnosis Coding ICD-10 Codes Code Description E11.621 Type 2 diabetes mellitus with foot ulcer Unspecified open wound of left lesser toe(s) without damage to nail, subsequent S91.105D encounter L03.032 Cellulitis of left toe E11.40 Type 2 diabetes mellitus with diabetic neuropathy, unspecified Facility Procedures CPT4: Description Modifier Quantity Code [id number] - DEBRIDE WOUND 1ST 20 SQ CM OR < 1 ICD-10 Description Diagnosis E11.621 Type 2 diabetes mellitus with foot ulcer S91.105D Unspecified open wound of left lesser toe(s) without damage to nail,  subsequent encounter Physician Procedures CPT4: Description Modifier Quantity Code [id number] - WC PHYS DEBR WO ANESTH 20 SQ CM 1 ICD-10 Description Diagnosis E11.621 Type 2 diabetes mellitus with  foot ulcer S91.105D Unspecified open wound of left lesser toe(s) without damage to nail,  subsequent encounter Electronic Signature(s) Signed: 10/07/2015 4:23:19 PM By: Loletha Grayer MD Entered By: Loletha Grayer on 10/07/2015 15:30:53

## 2015-10-14 ENCOUNTER — Encounter: Payer: Medicare Other | Attending: Surgery | Admitting: Surgery

## 2015-10-14 DIAGNOSIS — E11621 Type 2 diabetes mellitus with foot ulcer: Secondary | ICD-10-CM | POA: Diagnosis not present

## 2015-10-14 DIAGNOSIS — E1140 Type 2 diabetes mellitus with diabetic neuropathy, unspecified: Secondary | ICD-10-CM | POA: Insufficient documentation

## 2015-10-14 DIAGNOSIS — S91105S Unspecified open wound of left lesser toe(s) without damage to nail, sequela: Secondary | ICD-10-CM | POA: Diagnosis not present

## 2015-10-14 DIAGNOSIS — L97529 Non-pressure chronic ulcer of other part of left foot with unspecified severity: Secondary | ICD-10-CM | POA: Insufficient documentation

## 2015-10-15 NOTE — Progress Notes (Signed)
Nicholas [name], [name] ([id number]) Visit Report for 10/14/2015 Chief Complaint Document Details Patient Name: Nicholas [name], [name] Date of Service: 10/14/2015 1:30 PM Medical Record Number: [id number] Patient Account Number: [id number] Date of Birth/Sex: [birth year] (75 y.o. Male) Treating RN: Primary Care Physician: Arnette Norris Other Clinician: Referring Physician: Arnette Norris Treating Physician/Extender: BURNS III, Charlean Sanfilippo in Treatment: 3 Information Obtained from: Patient Chief Complaint Left toe ulcerations. Electronic Signature(s) Signed: 10/15/2015 8:51:38 AM By: Loletha Grayer MD Entered By: Loletha Grayer on 10/14/2015 14:18:07 Nicholas [name], [name] ([id number]) -------------------------------------------------------------------------------- Debridement Details Patient Name: Nicholas [name], [name] Date of Service: 10/14/2015 1:30 PM Medical Record Number: [id number] Patient Account Number: [id number] Date of Birth/Sex: [birth year] (75 y.o. Male) Treating RN: Primary Care Physician: Arnette Norris Other Clinician: Referring Physician: Arnette Norris Treating Physician/Extender: BURNS III, Charlean Sanfilippo in Treatment: 3 Debridement Performed for Wound #2 Left,Plantar Toe Second Assessment: Performed By: Physician BURNS III, Teressa Senter., MD Debridement: Debridement Pre-procedure Yes Verification/Time Out Taken: Start Time: 13:50 Pain Control: Other : lidocaine 4% Level: Skin/Subcutaneous Tissue Total Area Debrided (L x 1 (cm) x 1.2 (cm) = 1.2 (cm) W): Tissue and other Viable, Non-Viable, Fat, Fibrin/Slough, Skin, Subcutaneous material debrided: Instrument: Curette Bleeding: Minimum Hemostasis Achieved: Pressure End Time: 13:54 Procedural Pain: 0 Post Procedural Pain: 0 Response to Treatment: Procedure was tolerated well Post Debridement Measurements of Total Wound Length: (cm) 1 Width: (cm) 1.2 Depth: (cm) 0.2 Volume: (cm) 0.188 Post Procedure Diagnosis Same as Pre-procedure Electronic  Signature(s) Signed: 10/15/2015 8:51:38 AM By: Loletha Grayer MD Entered By: Loletha Grayer on 10/14/2015 14:17:51 Nicholas [name], [name] ([id number]) -------------------------------------------------------------------------------- HPI Details Patient Name: [name], [name] Date of Service: 10/14/2015 1:30 PM Medical Record Number: [id number] Patient Account Number: [id number] Date of Birth/Sex: July 25, 1941 (75 y.o. Male) Treating RN: Primary Care Physician: Arnette Norris Other Clinician: Referring Physician: Arnette Norris Treating Physician/Extender: BURNS III, Charlean Sanfilippo in Treatment: 3 History of Present Illness HPI Description: Pleasant 75 year old with history of diabetes (hemoglobin A1c 6.1 in November 2016), peripheral neuropathy, and sleep apnea. No history of PAD. Left ABI 1.01. He developed blisters and subsequent ulcerations on the plantar aspect of his left first and second toe in early December 2016. Denies any trauma, unusual activity, or new footwear. Has been fitted for orthotics in the past but did not like them. He is ambulating per his baseline without difficulty. No claudication or rest pain. 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 X-ray of left foot 09/23/2015 showed no evidence for osteomyelitis. Completed a course of doxycycline. Performing dressing changes with Prisma. Awaiting orthotics consultation at [facility]. He returns to clinic for follow-up and is without new complaints. No significant pain. No fever or chills. Minimal drainage. Electronic Signature(s) Signed: 10/15/2015 8:51:38 AM By: Loletha Grayer MD Entered By: Loletha Grayer on 10/14/2015 14:20:03 Nicholas [name], [name] ([id number]) -------------------------------------------------------------------------------- Physical Exam Details Patient Name: Nicholas [name], [name] Date of Service: 10/14/2015 1:30 PM Medical Record Number: [id number] Patient Account Number: [id number] Date of Birth/Sex: January 14, 1941 (75  y.o. Male) Treating RN: Primary Care Physician: Arnette Norris Other Clinician: Referring Physician: Arnette Norris Treating Physician/Extender: BURNS III, WALTER Weeks in Treatment: 3 Constitutional . Pulse regular. Respirations normal and unlabored. Afebrile. . Notes Left first and second toe plantar ulcerations improved. Full-thickness. No exposed deep structures. Cellulitis resolved. Palpable DP. Left ABI 1.01. New partial thickness ulceration on the dorsum of the left foot. Covered with dry adherent fibrinous exudate. No evidence for infection. Electronic Signature(s) Signed: 10/15/2015 8:51:38 AM By: Quay Burow,  Fabian Sharp MD Entered By: Loletha Grayer on 10/14/2015 14:20:46 Nicholas [name], [name] ([id number]) -------------------------------------------------------------------------------- Physician Orders Details Patient Name: Nicholas [name], [name] Date of Service: 10/14/2015 1:30 PM Medical Record Number: [id number] Patient Account Number: [id number] Date of Birth/Sex: Mar 17, 1941 (75 y.o. Male) Treating RN: Cornell Barman Primary Care Physician: Arnette Norris Other Clinician: Referring Physician: Arnette Norris Treating Physician/Extender: BURNS III, Charlean Sanfilippo in Treatment: 3 Verbal / Phone Orders: Yes Clinician: Cornell Barman Read Back and Verified: Yes Diagnosis Coding Wound Cleansing Wound #1 Left,Plantar Toe Great o Clean wound with Normal Saline. Wound #2 Left,Plantar Toe Second o Clean wound with Normal Saline. Wound #3 Left,Midline,Posterior Foot o Clean wound with Normal Saline. Wound #4 Right Toe Great o Clean wound with Normal Saline. Anesthetic Wound #1 Left,Plantar Toe Great o Topical Lidocaine 4% cream applied to wound bed prior to debridement Wound #2 Left,Plantar Toe Second o Topical Lidocaine 4% cream applied to wound bed prior to debridement Wound #3 Left,Midline,Posterior Foot o Topical Lidocaine 4% cream applied to wound bed prior to debridement Wound #4 Right Toe  Great o Topical Lidocaine 4% cream applied to wound bed prior to debridement Primary Wound Dressing Wound #1 Left,Plantar Toe Great o Aquacel Ag Wound #2 Left,Plantar Toe Second o Aquacel Ag Wound #3 Left,Midline,Posterior Foot o Aquacel Ag Wound #4 Right Toe Great Siwik, Aydon L. ([id number]) o Aquacel Ag Secondary Dressing Wound #1 Left,Plantar Toe Great o Gauze and Kerlix/Conform Wound #2 Left,Plantar Toe Second o Gauze and Kerlix/Conform Wound #3 Left,Midline,Posterior Foot o Gauze and Kerlix/Conform Wound #4 Right Toe Great o Gauze and Kerlix/Conform Dressing Change Frequency Wound #1 Left,Plantar Toe Great o Change dressing every other day. Wound #2 Left,Plantar Toe Second o Change dressing every other day. Wound #3 Left,Midline,Posterior Foot o Change dressing every other day. Wound #4 Right Toe Great o Change dressing every other day. Follow-up Appointments Wound #1 Left,Plantar Toe Great o Return Appointment in 1 week. Wound #2 Left,Plantar Toe Second o Return Appointment in 1 week. Wound #3 Left,Midline,Posterior Foot o Return Appointment in 1 week. Wound #4 Right Toe Great o Return Appointment in 1 week. Notes Patient has appointment with [facility] Tuesday for shoe fitting. Electronic Signature(s) Signed: 10/14/2015 5:28:49 PM By: Gretta Cool RN, BSN, Kim RN, BSN Signed: 10/15/2015 8:51:38 AM By: Loletha Grayer MD [place], [place] ([id number]) Entered By: Gretta Cool RN, BSN, Kim on 10/14/2015 13:56:22 Nicholas [name], [name] ([id number]) -------------------------------------------------------------------------------- Problem List Details Patient Name: Russman, Dain L. Date of Service: 10/14/2015 1:30 PM Medical Record Number: [id number] Patient Account Number: [id number] Date of Birth/Sex: Nov 21, 1940 (75 y.o. Male) Treating RN: Primary Care Physician: Arnette Norris Other Clinician: Referring Physician: Arnette Norris Treating Physician/Extender: BURNS  III, Charlean Sanfilippo in Treatment: 3 Active Problems ICD-10 Encounter Code Description Active Date Diagnosis E11.621 Type 2 diabetes mellitus with foot ulcer 09/23/2015 Yes S91.105S Unspecified open wound of left lesser toe(s) without 09/23/2015 Yes damage to nail, sequela E11.40 Type 2 diabetes mellitus with diabetic neuropathy, 09/23/2015 Yes unspecified Inactive Problems Resolved Problems ICD-10 Code Description Active Date Resolved Date S91.102D Unspecified open wound of left great toe without damage 09/23/2015 09/23/2015 to nail, subsequent encounter L03.032 Cellulitis of left toe 09/23/2015 09/23/2015 Electronic Signature(s) Signed: 10/15/2015 8:51:38 AM By: Loletha Grayer MD Entered By: Loletha Grayer on 10/14/2015 14:17:26 Nicholas [name], [name] ([id number]) -------------------------------------------------------------------------------- Progress Note Details Patient Name: Nicholas [name], [name] Date of Service: 10/14/2015 1:30 PM Medical Record Number: [id number] Patient Account Number: [id number] Date of Birth/Sex: [birth year] (75 y.o. Male) Treating RN:  Primary Care Physician: Arnette Norris Other Clinician: Referring Physician: Arnette Norris Treating Physician/Extender: BURNS III, Charlean Sanfilippo in Treatment: 3 Subjective Chief Complaint Information obtained from Patient Left toe ulcerations. History of Present Illness (HPI) Pleasant 75 year old with history of diabetes (hemoglobin A1c 6.1 in November 2016), peripheral neuropathy, and sleep apnea. No history of PAD. Left ABI 1.01. He developed blisters and subsequent ulcerations on the plantar aspect of his left first and second toe in early December 2016. Denies any trauma, unusual activity, or new footwear. Has been fitted for orthotics in the past but did not like them. He is ambulating per his baseline without difficulty. No claudication or rest pain. 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 X-ray of left foot  09/23/2015 showed no evidence for osteomyelitis. Completed a course of doxycycline. Performing dressing changes with Prisma. Awaiting orthotics consultation at [facility]. He returns to clinic for follow-up and is without new complaints. No significant pain. No fever or chills. Minimal drainage. Objective Constitutional Pulse regular. Respirations normal and unlabored. Afebrile. Vitals Time Taken: 1:32 PM, Height: 72 in, Weight: 250 lbs, BMI: 33.9, Temperature: 97.8 F, Pulse: 83 bpm, Respiratory Rate: 18 breaths/min, Blood Pressure: 151/62 mmHg. General Notes: Left first and second toe plantar ulcerations improved. Full-thickness. No exposed deep structures. Cellulitis resolved. Palpable DP. Left ABI 1.01. New partial thickness ulceration on the dorsum of the left foot. Covered with dry adherent fibrinous exudate. No evidence for infection. Integumentary (Hair, Skin) Shells, Nicholson L. ([id number]) Wound #1 status is Open. Original cause of wound was Blister. The wound is located on the [employer]. The wound measures 0.3cm length x 1.3cm width x 0.1cm depth; 0.306cm^2 area and 0.031cm^3 volume. The wound is limited to skin breakdown. There is no tunneling or undermining noted. There is a medium amount of serous drainage noted. The wound margin is flat and intact. There is no granulation within the wound bed. There is a large (67-100%) amount of necrotic tissue within the wound bed including Eschar and Adherent Slough. The periwound skin appearance exhibited: Callus, Localized Edema, Erythema. The periwound skin appearance did not exhibit: Crepitus, Excoriation, Fluctuance, Friable, Induration, Rash, Scarring, Dry/Scaly, Maceration, Moist, Atrophie Blanche, Cyanosis, Ecchymosis, Hemosiderin Staining, Mottled, Pallor, Rubor. The surrounding wound skin color is noted with erythema which is circumferential. Periwound temperature was noted as No Abnormality. The periwound has tenderness on  palpation. Wound #2 status is Open. Original cause of wound was Blister. The wound is located on the Left,Plantar Toe Second. The wound measures 1cm length x 1.2cm width x 0.1cm depth; 0.942cm^2 area and 0.094cm^3 volume. The wound is limited to skin breakdown. There is no tunneling or undermining noted. There is a medium amount of serous drainage noted. The wound margin is flat and intact. There is large (67-100%) red granulation within the wound bed. There is a small (1-33%) amount of necrotic tissue within the wound bed including Eschar and Adherent Slough. The periwound skin appearance exhibited: Callus, Erythema. The periwound skin appearance did not exhibit: Crepitus, Excoriation, Fluctuance, Friable, Induration, Localized Edema, Rash, Scarring, Dry/Scaly, Maceration, Moist, Atrophie Blanche, Cyanosis, Ecchymosis, Hemosiderin Staining, Mottled, Pallor, Rubor. The surrounding wound skin color is noted with erythema which is circumferential. Periwound temperature was noted as No Abnormality. The periwound has tenderness on palpation. Wound #3 status is Open. Original cause of wound was Blister. The wound is located on the [place]. The wound measures 0.3cm length x 0.3cm width x 0.1cm depth; 0.071cm^2 area and 0.007cm^3 volume. The wound is limited to  skin breakdown. There is no tunneling or undermining noted. There is a none present amount of drainage noted. The wound margin is flat and intact. There is no granulation within the wound bed. There is a large (67-100%) amount of necrotic tissue within the wound bed including Eschar. The periwound skin appearance exhibited: Erythema. The periwound skin appearance did not exhibit: Dry/Scaly, Maceration, Moist. The surrounding wound skin color is noted with erythema which is circumferential. Wound #4 status is Healed - Epithelialized. Original cause of wound was Gradually Appeared. The wound is located on the Right Toe Great.  The wound measures 0cm length x 0cm width x 0cm depth; 0cm^2 area and 0cm^3 volume. There is no tunneling or undermining noted. Wound #4 status is Healed - Epithelialized. Original cause of wound was Gradually Appeared. The wound is located on the Right Toe Great. The wound measures 0cm length x 0cm width x 0cm depth; 0cm^2 area and 0cm^3 volume. There is no tunneling or undermining noted. There is no granulation within the wound bed. There is no necrotic tissue within the wound bed. Assessment Active Problems ICD-10 Nicholas [name], [name] ([id number]) E11.621 - Type 2 diabetes mellitus with foot ulcer S91.105S - Unspecified open wound of left lesser toe(s) without damage to nail, sequela E11.40 - Type 2 diabetes mellitus with diabetic neuropathy, unspecified Left first and second toe plantar ulcerations. Left dorsal foot ulceration. Procedures Wound #2 Wound #2 is a Diabetic Wound/Ulcer of the Lower Extremity located on the Left,Plantar Toe Second . There was a Skin/Subcutaneous Tissue Debridement [id number]) debridement with total area of 1.2 sq cm performed by BURNS III, Teressa Senter., MD. with the following instrument(s): Curette to remove Viable and Non-Viable tissue/material including Fat, Fibrin/Slough, Skin, and Subcutaneous after achieving pain control using Other (lidocaine 4%). A time out was conducted prior to the start of the procedure. A Minimum amount of bleeding was controlled with Pressure. The procedure was tolerated well with a pain level of 0 throughout and a pain level of 0 following the procedure. Post Debridement Measurements: 1cm length x 1.2cm width x 0.2cm depth; 0.188cm^3 volume. Post procedure Diagnosis Wound #2: Same as Pre-Procedure Plan Wound Cleansing: Wound #1 Left,Plantar Toe Great: Clean wound with Normal Saline. Wound #2 Left,Plantar Toe Second: Clean wound with Normal Saline. Wound #3 Left,Midline,Posterior Foot: Clean wound with Normal Saline. Wound #4  Right Toe Great: Clean wound with Normal Saline. Anesthetic: Wound #1 Left,Plantar Toe Great: Topical Lidocaine 4% cream applied to wound bed prior to debridement Wound #2 Left,Plantar Toe Second: Topical Lidocaine 4% cream applied to wound bed prior to debridement Wound #3 Left,Midline,Posterior Foot: Topical Lidocaine 4% cream applied to wound bed prior to debridement Wound #4 Right Toe Great: Topical Lidocaine 4% cream applied to wound bed prior to debridement Bruneau, Quenten L. ([id number]) Primary Wound Dressing: Wound #1 Left,Plantar Toe Great: Aquacel Ag Wound #2 Left,Plantar Toe Second: Aquacel Ag Wound #3 Left,Midline,Posterior Foot: Aquacel Ag Wound #4 Right Toe Great: Aquacel Ag Secondary Dressing: Wound #1 Left,Plantar Toe Great: Gauze and Kerlix/Conform Wound #2 Left,Plantar Toe Second: Gauze and Kerlix/Conform Wound #3 Left,Midline,Posterior Foot: Gauze and Kerlix/Conform Wound #4 Right Toe Great: Gauze and Kerlix/Conform Dressing Change Frequency: Wound #1 Left,Plantar Toe Great: Change dressing every other day. Wound #2 Left,Plantar Toe Second: Change dressing every other day. Wound #3 Left,Midline,Posterior Foot: Change dressing every other day. Wound #4 Right Toe Great: Change dressing every other day. Follow-up Appointments: Wound #1 Left,Plantar Toe Great: Return Appointment in 1 week. Wound #2 Left,Plantar Toe  Second: Return Appointment in 1 week. Wound #3 Left,Midline,Posterior Foot: Return Appointment in 1 week. Wound #4 Right Toe Great: Return Appointment in 1 week. General Notes: Patient has appointment with [facility] Tuesday for shoe fitting. Silver alginate. Offloading with orthotics. He has an appointment at [facility] next Tuesday. Has declined total contact casting as far. Electronic Signature(s) Signed: 10/15/2015 8:51:38 AM By: Loletha Grayer MD Manley, Taegan Carlean Jews ([id number]) Entered By: Loletha Grayer on 10/14/2015 14:21:31 Gonia, Tori L.  ([id number]) -------------------------------------------------------------------------------- SuperBill Details Patient Name: Woodlief, Linton L. Date of Service: 10/14/2015 Medical Record Number: [id number] Patient Account Number: [id number] Date of Birth/Sex: September 25, 1941 (75 y.o. Male) Treating RN: Primary Care Physician: Arnette Norris Other Clinician: Referring Physician: Arnette Norris Treating Physician/Extender: BURNS III, Charlean Sanfilippo in Treatment: 3 Diagnosis Coding ICD-10 Codes Code Description E11.621 Type 2 diabetes mellitus with foot ulcer S91.105S Unspecified open wound of left lesser toe(s) without damage to nail, sequela E11.40 Type 2 diabetes mellitus with diabetic neuropathy, unspecified Facility Procedures CPT4 Code: [id number] Description: [id number] - DEB SUBQ TISSUE 20 SQ CM/< ICD-10 Description Diagnosis E11.621 Type 2 diabetes mellitus with foot ulcer Modifier: Quantity: 1 Physician Procedures CPT4 Code: [id number] Description: [id number] - WC PHYS SUBQ TISS 20 SQ CM ICD-10 Description Diagnosis E11.621 Type 2 diabetes mellitus with foot ulcer Modifier: Quantity: 1 Electronic Signature(s) Signed: 10/15/2015 8:51:38 AM By: Loletha Grayer MD Entered By: Loletha Grayer on 10/14/2015 14:21:48

## 2015-10-15 NOTE — Progress Notes (Signed)
Wilsey, Joedy L. ([id number]) Visit Report for 10/14/2015 Arrival Information Details Patient Name: Nicholas [name], [name] Date of Service: 10/14/2015 1:30 PM Medical Record Number: [id number] Patient Account Number: [id number] Date of Birth/Sex: November 04, 1940 (75 y.o. Male) Treating RN: Ahmed Prima Primary Care Physician: Arnette Norris Other Clinician: Referring Physician: Arnette Norris Treating Physician/Extender: BURNS III, Charlean Sanfilippo in Treatment: 3 Visit Information History Since Last Visit All ordered tests and consults were completed: No Patient Arrived: Ambulatory Added or deleted any medications: No Arrival Time: 13:31 Any new allergies or adverse reactions: No Accompanied By: self Had a fall or experienced change in No Transfer Assistance: None activities of daily living that may affect Patient Identification Verified: Yes risk of falls: Secondary Verification Process Yes Signs or symptoms of abuse/neglect since last No Completed: visito Patient Requires Transmission-Based No Hospitalized since last visit: No Precautions: Pain Present Now: Yes Patient Has Alerts: Yes Patient Alerts: DMII Electronic Signature(s) Signed: 10/14/2015 5:03:03 PM By: Alric Quan Entered By: Alric Quan on 10/14/2015 13:32:03 [place], [place]. ([id number]) -------------------------------------------------------------------------------- Encounter Discharge Information Details Patient Name: [name], [name] Date of Service: 10/14/2015 1:30 PM Medical Record Number: [id number] Patient Account Number: [id number] Date of Birth/Sex: 26-Jan-1941 (75 y.o. Male) Treating RN: Ahmed Prima Primary Care Physician: Arnette Norris Other Clinician: Referring Physician: Arnette Norris Treating Physician/Extender: BURNS III, Charlean Sanfilippo in Treatment: 3 Encounter Discharge Information Items Discharge Pain Level: 0 Discharge Condition: Stable Ambulatory Status: Ambulatory Discharge Destination: Home Transportation:  Private Auto Accompanied By: self Schedule Follow-up Appointment: Yes Medication Reconciliation completed and provided to Patient/Care Yes Katara Griner: Provided on Clinical Summary of Care: 10/14/2015 Form Type Recipient Paper Patient [facility] Electronic Signature(s) Signed: 10/14/2015 2:08:45 PM By: Ruthine Dose Entered By: Ruthine Dose on 10/14/2015 14:08:45 Munnerlyn, Britten L. ([id number]) -------------------------------------------------------------------------------- Lower Extremity Assessment Details Patient Name: [name], [name] Date of Service: 10/14/2015 1:30 PM Medical Record Number: [id number] Patient Account Number: [id number] Date of Birth/Sex: 1941-03-21 (74 y.o. Male) Treating RN: Ahmed Prima Primary Care Physician: Arnette Norris Other Clinician: Referring Physician: Arnette Norris Treating Physician/Extender: BURNS III, Charlean Sanfilippo in Treatment: 3 Vascular Assessment Pulses: Posterior Tibial Dorsalis Pedis Palpable: [Left:Yes] [Right:Yes] Extremity colors, hair growth, and conditions: Extremity Color: [Left:Hyperpigmented] [Right:Hyperpigmented] Hair Growth on Extremity: [Left:Yes] Temperature of Extremity: [Left:Warm] [Right:Warm] Capillary Refill: [Left:< 3 seconds] [Right:< 3 seconds] Toe Nail Assessment Left: Right: Thick: Yes Yes Discolored: Yes Yes Deformed: Yes Yes Improper Length and Hygiene: No No Electronic Signature(s) Signed: 10/14/2015 5:03:03 PM By: Alric Quan Entered By: Alric Quan on 10/14/2015 13:39:18 Mauri, Dareon L. ([id number]) -------------------------------------------------------------------------------- Multi Wound Chart Details Patient Name: [name], [name] Date of Service: 10/14/2015 1:30 PM Medical Record Number: [id number] Patient Account Number: [id number] Date of Birth/Sex: August 19, 1941 (75 y.o. 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, Other the Lower Extremity] [2:the Lower Extremity] Comorbid History: [1:Type II Diabetes, Osteoarthritis, Neuropathy Osteoarthritis, Neuropathy Osteoarthritis, Neuropathy] [2:Type II Diabetes,] [3:Type II Diabetes,] Date Acquired: [1:09/16/2015] [2:09/16/2015] [3:09/28/2015] Weeks of Treatment: [1:3] [2:3] [3:2] Wound Status: [1:Open] [2:Open] [3:Open] Measurements L x W x D 0.3x1.3x0.1 [2:1x1.2x0.1] [3:0.3x0.3x0.1] (  cm) Area (cm) : [1:0.306] [2:0.942] [3:0.071] Volume (cm) : [1:0.031] [2:0.094] [3:0.007] % Reduction in Area: [1:44.40%] [2:0.00%] [3:74.90%] % Reduction in Volume: 43.60% [2:0.00%] [3:75.00%] Classification: [1:Grade 2] [2:Grade 2] [3:Partial Thickness] HBO Classification: [1:N/A] [2:N/A] [3:Grade 0] Exudate Amount: [1:Medium] [2:Medium] [3:None Present] Exudate Type: [1:Serous] [2:Serous] [3:N/A] Exudate Color: [1:amber] [2:amber] [3:N/A] Wound Margin: [1:Flat and Intact] [2:Flat and Intact] [3:Flat and Intact] Granulation Amount: [1:None Present (0%)] [2:Large (67-100%)] [3:None Present (0%)] Granulation Quality: [1:N/A] [2:Red] [3:N/A] Necrotic Amount: [1:Large (67-100%)] [2:Small (1-33%)] [3:Large (67-100%)] Necrotic Tissue: [1:Eschar, Adherent Slough Eschar, Adherent Slough Eschar] Exposed Structures: [1:Fascia: No Fat: No Tendon: No] [2:Fascia: No Fat: No Tendon: No] [3:Fascia: No Fat: No Tendon: No] Muscle: No Muscle: No Muscle: No Joint:  No Joint: No Joint: No Bone: No Bone: No Bone: No Limited to Skin Limited to Skin Limited to Skin Breakdown Breakdown Breakdown Epithelialization: None None None Periwound Skin Texture: Edema: Yes Callus: Yes No Abnormalities Noted Callus: Yes Edema: No Excoriation: No Excoriation: No Induration: No Induration: No Crepitus: No Crepitus: No Fluctuance: No Fluctuance: No Friable: No Friable: No Rash: No Rash: No Scarring: No Scarring: No Periwound Skin Maceration: No Maceration: No Maceration: No Moisture: Moist: No Moist: No Moist: No Dry/Scaly: No Dry/Scaly: No Dry/Scaly: No Periwound Skin Color: Erythema: Yes Erythema: Yes Erythema: Yes Atrophie Blanche: No Atrophie Blanche: No Cyanosis: No Cyanosis: No Ecchymosis: No Ecchymosis: No Hemosiderin Staining: No Hemosiderin Staining: No Mottled: No Mottled: No Pallor: No Pallor: No Rubor: No Rubor: No Erythema Location: Circumferential Circumferential Circumferential Temperature: No Abnormality No Abnormality N/A Tenderness on Yes Yes No Palpation: Wound Preparation: Ulcer Cleansing: Ulcer Cleansing: Ulcer Cleansing: Rinsed/Irrigated with Rinsed/Irrigated with Rinsed/Irrigated with Saline Saline Saline Topical Anesthetic Topical Anesthetic Topical Anesthetic Applied: Other: lidocaine Applied: Other: lidocaine Applied: None, Other: 4% 4% lidocaine 4% Wound Number: 4 N/A N/A Photos: No Photos N/A N/A Wound Location: Right Toe Great N/A N/A Wounding Event: Gradually Appeared N/A N/A Primary Etiology: To be determined N/A N/A Comorbid History: Type II Diabetes, N/A N/A Osteoarthritis, Neuropathy Date Acquired: 10/13/2015 N/A N/A Weeks of Treatment: 0 N/A N/A Wound Status: Healed - Epithelialized N/A N/A Measurements L x W x D 0x0x0 N/A N/A (cm) Letourneau, Faaris L. ([id number]) Area (cm) : 0 N/A N/A Volume (cm) : 0 N/A N/A % Reduction in Area: N/A N/A N/A % Reduction in Volume: N/A N/A  N/A Classification: Unclassifiable N/A N/A HBO Classification: Unable to visualize wound N/A N/A bed Exudate Amount: N/A N/A N/A Exudate Type: N/A N/A N/A Exudate Color: N/A N/A N/A Wound Margin: N/A N/A N/A Granulation Amount: N/A N/A N/A Granulation Quality: N/A N/A N/A Necrotic Amount: N/A N/A N/A Necrotic Tissue: N/A N/A N/A Exposed Structures: N/A N/A N/A Epithelialization: Large (67-100%) N/A N/A Periwound Skin Texture: No Abnormalities Noted N/A N/A Periwound Skin No Abnormalities Noted N/A N/A Moisture: Periwound Skin Color: No Abnormalities Noted N/A N/A Erythema Location: N/A N/A N/A Temperature: N/A N/A N/A Tenderness on No N/A N/A Palpation: Wound Preparation: N/A N/A N/A Treatment Notes Electronic Signature(s) Signed: 10/14/2015 5:03:03 PM By: Alric Quan Entered By: Alric Quan on 10/14/2015 13:49:37 Poulsen, Esteven L. ([id number]) -------------------------------------------------------------------------------- [place] Details Patient Name: Pagliarulo, Cherokee L. Date of Service: 10/14/2015 1:30 PM Medical Record Number: [id number] Patient Account Number: [id number] Date of Birth/Sex: Jun 30, 1941 (75 y.o. Male) Treating RN: Ahmed Prima Primary Care Physician: Arnette Norris Other Clinician: Referring Physician: Arnette Norris Treating Physician/Extender: BURNS III, Charlean Sanfilippo in Treatment: 3 Active Inactive Orientation to the Wound Care  Program Nursing Diagnoses: Knowledge deficit related to the wound healing center program Goals: Patient/caregiver will verbalize understanding of the [facility] Date Initiated: 09/23/2015 Goal Status: Active Interventions: Provide education on orientation to the wound center Notes: Wound/Skin Impairment Nursing Diagnoses: Impaired tissue integrity Knowledge deficit related to ulceration/compromised skin integrity Goals: Patient/caregiver will verbalize understanding of skin care  regimen Date Initiated: 09/23/2015 Goal Status: Active Ulcer/skin breakdown will have a volume reduction of 30% by week 4 Date Initiated: 09/23/2015 Goal Status: Active Ulcer/skin breakdown will have a volume reduction of 50% by week 8 Date Initiated: 09/23/2015 Goal Status: Active Ulcer/skin breakdown will have a volume reduction of 80% by week 12 Date Initiated: 09/23/2015 Goal Status: Active Ulcer/skin breakdown will heal within 14 weeks Date Initiated: 09/23/2015 Hollenbeck, Jessen L. ([id number]) Goal Status: Active Interventions: Assess patient/caregiver ability to perform ulcer/skin care regimen upon admission and as needed Assess ulceration(s) every visit Notes: Electronic Signature(s) Signed: 10/14/2015 5:03:03 PM By: Alric Quan Entered By: Alric Quan on 10/14/2015 13:49:31 Weider, Brenson L. ([id number]) -------------------------------------------------------------------------------- Pain Assessment Details Patient Name: Adinolfi, Merwin L. Date of Service: 10/14/2015 1:30 PM Medical Record Number: [id number] Patient Account Number: [id number] Date of Birth/Sex: June 03, 1941 (75 y.o. Male) Treating RN: Ahmed Prima Primary Care Physician: Arnette Norris Other Clinician: Referring Physician: Arnette Norris Treating Physician/Extender: BURNS III, Charlean Sanfilippo in Treatment: 3 Active Problems Location of Pain Severity and Description of Pain Patient Has Paino Yes Site Locations Pain Location: Pain in Ulcers With Dressing Change: Yes Duration of the Pain. Constant / Intermittento Constant Rate the pain. Current Pain Level: 6 Character of Pain Describe the Pain: Sharp, Stabbing, Throbbing Pain Management and Medication Current Pain Management: Electronic Signature(s) Signed: 10/14/2015 5:03:03 PM By: Alric Quan Entered By: Alric Quan on 10/14/2015 13:32:33 Menges, Sadat L.  ([id number]) -------------------------------------------------------------------------------- Patient/Caregiver Education Details Patient Name: Charlesworth, Saatvik L. Date of Service: 10/14/2015 1:30 PM Medical Record Number: [id number] Patient Account Number: [id number] Date of Birth/Gender: November 27, 1940 (75 y.o. Male) Treating RN: Ahmed Prima Primary Care Physician: Arnette Norris Other Clinician: Referring Physician: Arnette Norris Treating Physician/Extender: BURNS III, Charlean Sanfilippo in Treatment: 3 Education Assessment Education Provided To: Patient Education Topics Provided Wound/Skin Impairment: Handouts: Other: change dressing as directed Methods: Demonstration, Explain/Verbal Responses: State content correctly Electronic Signature(s) Signed: 10/14/2015 5:03:03 PM By: Alric Quan Entered By: Alric Quan on 10/14/2015 14:04:45 Kagan, Zhane L. ([id number]) -------------------------------------------------------------------------------- Wound Assessment Details Patient Name: Askin, Didier L. Date of Service: 10/14/2015 1:30 PM Medical Record Number: [id number] Patient Account Number: [id number] Date of Birth/Sex: Mar 15, 1941 (75 y.o. Male) Treating RN: Ahmed Prima Primary Care Physician: Arnette Norris Other Clinician: Referring Physician: Arnette Norris Treating Physician/Extender: BURNS III, Charlean Sanfilippo in Treatment: 3 Wound Status Wound Number: 1 Primary Diabetic Wound/Ulcer of the Lower Etiology: Extremity Wound Location: Left Toe Great - Plantar Wound Status: Open Wounding Event: Blister Comorbid Type II Diabetes, Osteoarthritis, Date Acquired: 09/16/2015 History: Neuropathy Weeks Of Treatment: 3 Clustered Wound: No Photos Photo Uploaded By: Alric Quan on 10/14/2015 15:07:51 Wound Measurements Length: (cm) 0.3 Width: (cm) 1.3 Depth: (cm) 0.1 Area: (cm) 0.306 Volume: (cm) 0.031 % Reduction in Area: 44.4% % Reduction in Volume: 43.6% Epithelialization:  None Tunneling: No Undermining: No Wound Description Classification: Grade 2 Wound Margin: Flat and Intact Exudate Amount: Medium Exudate Type: Serous Exudate Color: amber Foul Odor After Cleansing: No Wound Bed Granulation Amount: None Present (0%) Exposed Structure Necrotic Amount: Large (67-100%) Fascia Exposed: No Necrotic Quality: Eschar, Adherent Slough Fat Layer Exposed: No Tendon Exposed: No  Manuele, Jayleen L. ([id number]) Muscle Exposed: No Joint Exposed: No Bone Exposed: No Limited to Skin Breakdown Periwound Skin Texture Texture Color No Abnormalities Noted: No No Abnormalities Noted: No Callus: Yes Atrophie Blanche: No Crepitus: No Cyanosis: No Excoriation: No Ecchymosis: No Fluctuance: No Erythema: Yes Friable: No Erythema Location: Circumferential Induration: No Hemosiderin Staining: No Localized Edema: Yes Mottled: No Rash: No Pallor: No Scarring: No Rubor: No Moisture Temperature / Pain No Abnormalities Noted: No Temperature: No Abnormality Dry / Scaly: No Tenderness on Palpation: Yes Maceration: No Moist: No Wound Preparation Ulcer Cleansing: Rinsed/Irrigated with Saline Topical Anesthetic Applied: Other: lidocaine 4%, Treatment Notes Wound #1 (Left, Plantar Toe Great) 1. Cleansed with: Clean wound with Normal Saline 2. Anesthetic Topical Lidocaine 4% cream to wound bed prior to debridement 4. Dressing Applied: Aquacel Ag 5. Secondary Dressing Applied Gauze and Kerlix/Conform Notes netting Electronic Signature(s) Signed: 10/14/2015 5:03:03 PM By: Alric Quan Entered By: Alric Quan on 10/14/2015 13:42:51 Patmon, Leslee L. ([id number]) -------------------------------------------------------------------------------- Wound Assessment Details Patient Name: Masaki, Jama L. Date of Service: 10/14/2015 1:30 PM Medical Record Number: [id number] Patient Account Number: [id number] Date of Birth/Sex: 1941/09/21 (75 y.o. Male) Treating RN:  Ahmed Prima Primary Care Physician: Arnette Norris Other Clinician: Referring Physician: Arnette Norris Treating Physician/Extender: BURNS III, Charlean Sanfilippo in Treatment: 3 Wound Status Wound Number: 2 Primary Diabetic Wound/Ulcer of the Lower Etiology: Extremity Wound Location: Left Toe Second - Plantar Wound Status: Open Wounding Event: Blister Comorbid Type II Diabetes, Osteoarthritis, Date Acquired: 09/16/2015 History: Neuropathy Weeks Of Treatment: 3 Clustered Wound: No Photos Photo Uploaded By: Alric Quan on 10/14/2015 15:07:51 Wound Measurements Length: (cm) 1 Width: (cm) 1.2 Depth: (cm) 0.1 Area: (cm) 0.942 Volume: (cm) 0.094 % Reduction in Area: 0% % Reduction in Volume: 0% Epithelialization: None Tunneling: No Undermining: No Wound Description Classification: Grade 2 Wound Margin: Flat and Intact Exudate Amount: Medium Exudate Type: Serous Exudate Color: amber Foul Odor After Cleansing: No Wound Bed Granulation Amount: Large (67-100%) Exposed Structure Granulation Quality: Red Fascia Exposed: No Necrotic Amount: Small (1-33%) Fat Layer Exposed: No Necrotic Quality: Eschar, Adherent Slough Tendon Exposed: No Radovich, Tahjae L. ([id number]) Muscle Exposed: No Joint Exposed: No Bone Exposed: No Limited to Skin Breakdown Periwound Skin Texture Texture Color No Abnormalities Noted: No No Abnormalities Noted: No Callus: Yes Atrophie Blanche: No Crepitus: No Cyanosis: No Excoriation: No Ecchymosis: No Fluctuance: No Erythema: Yes Friable: No Erythema Location: Circumferential Induration: No Hemosiderin Staining: No Localized Edema: No Mottled: No Rash: No Pallor: No Scarring: No Rubor: No Moisture Temperature / Pain No Abnormalities Noted: No Temperature: No Abnormality Dry / Scaly: No Tenderness on Palpation: Yes Maceration: No Moist: No Wound Preparation Ulcer Cleansing: Rinsed/Irrigated with Saline Topical Anesthetic  Applied: Other: lidocaine 4%, Treatment Notes Wound #2 (Left, Plantar Toe Second) 1. Cleansed with: Clean wound with Normal Saline 2. Anesthetic Topical Lidocaine 4% cream to wound bed prior to debridement 4. Dressing Applied: Aquacel Ag 5. Secondary Dressing Applied Gauze and Kerlix/Conform Notes netting Electronic Signature(s) Signed: 10/14/2015 5:03:03 PM By: Alric Quan Entered By: Alric Quan on 10/14/2015 13:43:55 Musolino, Montay L. ([id number]) -------------------------------------------------------------------------------- Wound Assessment Details Patient Name: Hettich, Habeeb L. Date of Service: 10/14/2015 1:30 PM Medical Record Number: [id number] Patient Account Number: [id number] Date of Birth/Sex: 10-24-1940 (75 y.o. Male) Treating RN: Ahmed Prima Primary Care Physician: Arnette Norris Other Clinician: Referring Physician: Arnette Norris Treating Physician/Extender: BURNS III, Charlean Sanfilippo in Treatment: 3 Wound Status Wound Number: 3 Primary Trauma, Other Etiology: Wound Location: Left Foot - Midline,  Posterior Wound Status: Open Wounding Event: Blister Comorbid Type II Diabetes, Osteoarthritis, Date Acquired: 09/28/2015 History: Neuropathy Weeks Of Treatment: 2 Clustered Wound: No Photos Photo Uploaded By: Alric Quan on 10/14/2015 15:08:17 Wound Measurements Length: (cm) 0.3 Width: (cm) 0.3 Depth: (cm) 0.1 Area: (cm) 0.071 Volume: (cm) 0.007 % Reduction in Area: 74.9% % Reduction in Volume: 75% Epithelialization: None Tunneling: No Undermining: No Wound Description Classification: Partial Thickness Foul Odor Diabetic Severity (Wagner): Grade 0 Wound Margin: Flat and Intact Exudate Amount: None Present After Cleansing: No Wound Bed Granulation Amount: None Present (0%) Exposed Structure Necrotic Amount: Large (67-100%) Fascia Exposed: No Necrotic Quality: Eschar Fat Layer Exposed: No Tendon Exposed: No Muscle Exposed: No Eick, Alexiz  L. ([id number]) Joint Exposed: No Bone Exposed: No Limited to Skin Breakdown Periwound Skin Texture Texture Color No Abnormalities Noted: No No Abnormalities Noted: No Erythema: Yes Moisture Erythema Location: Circumferential No Abnormalities Noted: No Dry / Scaly: No Maceration: No Moist: No Wound Preparation Ulcer Cleansing: Rinsed/Irrigated with Saline Topical Anesthetic Applied: None, Other: lidocaine 4%, Treatment Notes Wound #3 (Left, Midline, Posterior Foot) 1. Cleansed with: Clean wound with Normal Saline 2. Anesthetic Topical Lidocaine 4% cream to wound bed prior to debridement 4. Dressing Applied: Aquacel Ag 5. Secondary Dressing Applied Gauze and Kerlix/Conform Notes netting Electronic Signature(s) Signed: 10/14/2015 5:03:03 PM By: Alric Quan Entered By: Alric Quan on 10/14/2015 13:41:43 Runkel, Waldo L. ([id number]) -------------------------------------------------------------------------------- Wound Assessment Details Patient Name: Bodie, Treavor L. Date of Service: 10/14/2015 1:30 PM Medical Record Number: [id number] Patient Account Number: [id number] Date of Birth/Sex: August 06, 1941 (75 y.o. Male) Treating RN: Ahmed Prima Primary Care Physician: Arnette Norris Other Clinician: Referring Physician: Arnette Norris Treating Physician/Extender: BURNS III, Charlean Sanfilippo in Treatment: 3 Wound Status Wound Number: 4 Primary To be determined Etiology: Wound Location: Right Toe Great Wound Status: Healed - Epithelialized Wounding Event: Gradually Appeared Comorbid Type II Diabetes, Osteoarthritis, Date Acquired: 10/13/2015 History: Neuropathy Weeks Of Treatment: 0 Clustered Wound: No Wound Measurements Length: (cm) 0 Width: (cm) 0 Depth: (cm) 0 Area: (cm) 0 Volume: (cm) 0 % Reduction in Area: % Reduction in Volume: Epithelialization: Large (67-100%) Tunneling: No Undermining: No Wound Description Classification: Unclassifiable Diabetic Severity  Unable to visualize wound Earleen Newport): bed Periwound Skin Texture Texture Color No Abnormalities Noted: No No Abnormalities Noted: No Moisture No Abnormalities Noted: No Electronic Signature(s) Signed: 10/14/2015 5:03:03 PM By: Alric Quan Entered By: Alric Quan on 10/14/2015 13:49:20 Steel, Quavis L. ([id number]) -------------------------------------------------------------------------------- Wound Assessment Details Patient Name: Moorer, Shyquan L. Date of Service: 10/14/2015 1:30 PM Medical Record Number: [id number] Patient Account Number: [id number] Date of Birth/Sex: [birth year] (75 y.o. Male) Treating RN: Ahmed Prima Primary Care Physician: Arnette Norris Other Clinician: Referring Physician: Arnette Norris Treating Physician/Extender: BURNS III, Charlean Sanfilippo in Treatment: 3 Wound Status Wound Number: 4 Primary To be determined Etiology: Wound Location: Right Toe Great Wound Status: Healed - Epithelialized Wounding Event: Gradually Appeared Comorbid Type II Diabetes, Osteoarthritis, Date Acquired: 10/13/2015 History: Neuropathy Weeks Of Treatment: 0 Clustered Wound: No Wound Measurements Length: (cm) 0 % Reduction Width: (cm) 0 % Reduction Depth: (cm) 0 Epithelializ Area: (cm) 0 Tunneling: Volume: (cm) 0 Undermining in Area: in Volume: ation: Large (67-100%) No : No Wound Description Classification: Unclassifiable Diabetic Severity Unable to visualize wound Earleen Newport): bed Wound Bed Granulation Amount: None Present (0%) Necrotic Amount: None Present (0%) Periwound Skin Texture Texture Color No Abnormalities Noted: No No Abnormalities Noted: No Moisture No Abnormalities Noted: No Electronic Signature(s) Signed: 10/14/2015 5:03:03 PM By: Alric Quan Entered  By: Alric Quan on 10/14/2015 14:02:52 Subramaniam, Stanislaus L. ([id number]) -------------------------------------------------------------------------------- Vitals Details Patient Name: Kuchera, Whittaker  L. Date of Service: 10/14/2015 1:30 PM Medical Record Number: [id number] Patient Account Number: [id number] Date of Birth/Sex: 02-27-1941 (75 y.o. Male) Treating RN: Ahmed Prima Primary Care Physician: Arnette Norris Other Clinician: Referring Physician: Arnette Norris Treating Physician/Extender: BURNS III, Charlean Sanfilippo in Treatment: 3 Vital Signs Time Taken: 13:32 Temperature (F): 97.8 Height (in): 72 Pulse (bpm): 83 Weight (lbs): 250 Respiratory Rate (breaths/min): 18 Body Mass Index (BMI): 33.9 Blood Pressure (mmHg): 151/62 Reference Range: 80 - 120 mg / dl Electronic Signature(s) Signed: 10/14/2015 5:03:03 PM By: Alric Quan Entered By: Alric Quan on 10/14/2015 13:35:54

## 2015-10-21 ENCOUNTER — Encounter: Payer: Medicare Other | Admitting: Surgery

## 2015-10-21 DIAGNOSIS — E1140 Type 2 diabetes mellitus with diabetic neuropathy, unspecified: Secondary | ICD-10-CM | POA: Diagnosis not present

## 2015-10-21 DIAGNOSIS — S91105S Unspecified open wound of left lesser toe(s) without damage to nail, sequela: Secondary | ICD-10-CM | POA: Diagnosis not present

## 2015-10-21 DIAGNOSIS — L97529 Non-pressure chronic ulcer of other part of left foot with unspecified severity: Secondary | ICD-10-CM | POA: Diagnosis not present

## 2015-10-21 DIAGNOSIS — E11621 Type 2 diabetes mellitus with foot ulcer: Secondary | ICD-10-CM | POA: Diagnosis not present

## 2015-10-22 NOTE — Progress Notes (Addendum)
Brien, Cranston L. ([id number]) Visit Report for 10/21/2015 Arrival Information Details Patient Name: Nicholas [name], [name] Date of Service: 10/21/2015 3:45 PM Medical Record Number: [id number] Patient Account Number: [id number] Date of Birth/Sex: 19-Oct-1940 (75 y.o. Male) Treating RN: Ahmed Prima Primary Care Physician: Arnette Norris Other Clinician: Referring Physician: Arnette Norris Treating Physician/Extender: BURNS III, Charlean Sanfilippo in Treatment: 4 Visit Information History Since Last Visit All ordered tests and consults were completed: No Patient Arrived: Ambulatory Added or deleted any medications: No Arrival Time: 15:47 Any new allergies or adverse reactions: No Accompanied By: self Had a fall or experienced change in No Transfer Assistance: None activities of daily living that may affect Patient Identification Verified: Yes risk of falls: Secondary Verification Process Yes Signs or symptoms of abuse/neglect since last No Completed: visito Patient Requires Transmission-Based No Hospitalized since last visit: No Precautions: Pain Present Now: No Patient Has Alerts: Yes Patient Alerts: DMII Electronic Signature(s) Signed: 10/21/2015 4:31:17 PM By: Alric Quan Entered By: Alric Quan on 10/21/2015 15:47:39 Stutsman, Reform. ([id number]) -------------------------------------------------------------------------------- Encounter Discharge Information Details Patient Name: Nicholas [name], [name] Date of Service: 10/21/2015 3:45 PM Medical Record Number: [id number] Patient Account Number: [id number] Date of Birth/Sex: 26-Aug-1941 (75 y.o. Male) Treating RN: Ahmed Prima Primary Care Physician: Arnette Norris Other Clinician: Referring Physician: Arnette Norris Treating Physician/Extender: BURNS III, Charlean Sanfilippo in Treatment: 4 Encounter Discharge Information Items Discharge Pain Level: 0 Discharge Condition: Stable Ambulatory Status: Ambulatory Discharge Destination:  Home Transportation: Private Auto Accompanied By: self Schedule Follow-up Appointment: Yes Medication Reconciliation completed and provided to Patient/Care Yes Tykera Skates: Provided on Clinical Summary of Care: 10/21/2015 Form Type Recipient Paper Patient [place] Signature(s) Signed: 10/21/2015 4:31:17 PM By: Alric Quan Previous Signature: 10/21/2015 4:09:14 PM Version By: Ruthine Dose Entered By: Alric Quan on 10/21/2015 16:11:26 Nicholas [name], [name] ([id number]) -------------------------------------------------------------------------------- Multi Wound Chart Details Patient Name: [name], [name] Date of Service: 10/21/2015 3:45 PM Medical Record Number: [id number] Patient Account Number: [id number] Date of Birth/Sex: May 27, 1941 (75 y.o. 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, Other the Lower Extremity] [2:the Lower Extremity] Comorbid History: [1:Type II Diabetes, Osteoarthritis, Neuropathy Osteoarthritis, Neuropathy] [2:Type II Diabetes,] [3:N/A] Date Acquired: [1:09/16/2015] [2:09/16/2015] [3:09/28/2015] Weeks of Treatment: [1:4] [2:4] [3:3] Wound Status: [1:Open] [2:Open] [3:Healed - Epithelialized] Measurements L x W x D 0.2x0.5x0.1 [2:0.3x0.3x0.1] [3:0x0x0] (cm) Area (cm) : [1:0.079] [2:0.071] [3:0] Volume (cm) : [1:0.008] [2:0.007] [3:0] % Reduction in Area:  [1:85.60%] [2:92.50%] [3:100.00%] % Reduction in Volume: 85.50% [2:92.60%] [3:100.00%] Classification: [1:Grade 2] [2:Grade 2] [3:Partial Thickness] Exudate Amount: [1:Medium] [2:Medium] [3:N/A] Exudate Type: [1:Serous] [2:Serous] [3:N/A] Exudate Color: [1:amber] [2:amber] [3:N/A] Wound Margin: [1:Flat and Intact] [2:Flat and Intact] [3:N/A] Granulation Amount: [1:None Present (0%)] [2:Medium (34-66%)] [3:N/A] Granulation Quality: [1:N/A] [2:Red] [3:N/A] Necrotic Amount: [1:Large (67-100%)] [2:Medium (34-66%)] [3:N/A] Necrotic Tissue: [1:Eschar, Adherent Slough [place] N/A] Exposed Structures: [1:Fascia: No Fat: No Tendon: No Muscle: No] [2:Fascia: No Fat: No Tendon: No Muscle: No] [3:N/A] Joint: No Joint: No Bone: No Bone: No Limited to Skin Limited to Skin Breakdown Breakdown Epithelialization: Small (1-33%) Small (1-33%) N/A  Periwound Skin Texture: Edema: Yes Callus: Yes No Abnormalities Noted Callus: Yes Edema: No Excoriation: No Excoriation: No Induration: No Induration: No Crepitus: No Crepitus: No Fluctuance: No Fluctuance: No Friable: No Friable: No Rash: No Rash: No Scarring: No Scarring: No Periwound Skin Maceration: No Moist: Yes No Abnormalities Noted Moisture: Moist: No Maceration: No Dry/Scaly: No Dry/Scaly: No Periwound Skin Color: Erythema: Yes Erythema: Yes No Abnormalities Noted Atrophie Blanche: No Atrophie Blanche: No Cyanosis: No Cyanosis: No Ecchymosis: No Ecchymosis: No Hemosiderin Staining: No Hemosiderin Staining: No Mottled: No Mottled: No Pallor: No Pallor: No Rubor: No Rubor: No Erythema Location: Circumferential Circumferential N/A Temperature: No Abnormality No Abnormality N/A Tenderness on Yes Yes No Palpation: Wound Preparation: Ulcer Cleansing: Ulcer Cleansing: N/A Rinsed/Irrigated with Rinsed/Irrigated with Saline Saline Topical Anesthetic Topical Anesthetic Applied: Other: lidocaine Applied: Other:  lidocaine 4% 4% Treatment Notes Electronic Signature(s) Signed: 10/21/2015 4:31:17 PM By: Alric Quan Entered By: Alric Quan on 10/21/2015 15:59:02 Nicholas [name], [name] ([id number]) -------------------------------------------------------------------------------- Multi-Disciplinary Care Plan Details Patient Name: Nicholas [name], [name] Date of Service: 10/21/2015 3:45 PM Medical Record Number: [id number] Patient Account Number: [id number] Date of Birth/Sex: [birth year] (74 y.o. Male) Treating RN: Ahmed Prima Primary Care Physician: Arnette Norris Other Clinician: Referring Physician: Arnette Norris Treating Physician/Extender: BURNS III, Charlean Sanfilippo in Treatment: 4 Active Inactive Orientation to the Wound Care Program Nursing Diagnoses: Knowledge deficit related to the wound healing center program Goals: Patient/caregiver will verbalize understanding of the [place] Program Date Initiated: 09/23/2015 Goal Status: Active Interventions: Provide education on orientation to the wound center Notes: Wound/Skin Impairment Nursing Diagnoses: Impaired tissue integrity Knowledge deficit related to ulceration/compromised skin integrity Goals: Patient/caregiver will verbalize understanding of skin care regimen Date Initiated: 09/23/2015 Goal Status: Active Ulcer/skin breakdown will have a volume reduction of 30% by week 4 Date Initiated: 09/23/2015 Goal Status: Active Ulcer/skin breakdown will have a volume reduction of 50% by week 8 Date Initiated: 09/23/2015 Goal Status: Active Ulcer/skin breakdown will have a volume reduction of 80% by week 12 Date Initiated: 09/23/2015 Goal Status: Active Ulcer/skin breakdown will heal within 14 weeks Date Initiated: 09/23/2015 Nicholas [name], [name] ([id number]) Goal Status: Active Interventions: Assess patient/caregiver ability to perform ulcer/skin care regimen upon admission and as needed Assess ulceration(s) every visit Notes: Electronic  Signature(s) Signed: 10/21/2015 4:31:17 PM By: Alric Quan Entered By: Alric Quan on 10/21/2015 15:58:54 Nicholas [name], [name] ([id number]) -------------------------------------------------------------------------------- Pain Assessment Details Patient Name: Nicholas [name], [name] Date of Service: 10/21/2015 3:45 PM Medical Record Number: [id number] Patient Account Number: [id number] Date of Birth/Sex: 17-Aug-1941 (75 y.o. Male) Treating RN: Ahmed Prima Primary Care Physician: Arnette Norris Other Clinician: Referring Physician: Arnette Norris Treating Physician/Extender: BURNS III, Charlean Sanfilippo in Treatment: 4 Active Problems Location of Pain Severity and Description of Pain Patient Has Paino Yes Site Locations Pain Location: Pain in Ulcers With Dressing Change: Yes Duration of the Pain. Constant / Intermittento Constant Rate the pain. Current Pain Level: 6 Character of Pain Describe the Pain: Throbbing Pain Management and Medication Current Pain Management: Electronic Signature(s) Signed: 10/21/2015 4:31:17 PM By: Alric Quan Entered By: Alric Quan on 10/21/2015 15:47:58 Nicholas [name], [name] ([id number]) -------------------------------------------------------------------------------- Patient/Caregiver Education Details Patient Name: Nicholas [name], [name] Date of Service: 10/21/2015 3:45 PM Medical Record Number: [id number] Patient Account Number: [id number] Date of Birth/Gender: 04-08-1941 (75 y.o. Male) Treating RN: Ahmed Prima Primary Care Physician: Arnette Norris Other Clinician: Referring Physician: Arnette Norris Treating Physician/Extender: BURNS III, Charlean Sanfilippo in Treatment: 4 Education Assessment Education Provided To: Patient Education Topics Provided Wound/Skin  Impairment: Handouts: Other: change dressing as directed Methods: Demonstration, Explain/Verbal Responses: State content correctly Electronic Signature(s) Signed: 10/21/2015 4:31:17 PM By: Alric Quan Entered By: Alric Quan on 10/21/2015 16:11:45 Nicholas [name], [name] ([id number]) -------------------------------------------------------------------------------- Wound Assessment Details Patient Name: Nicholas [name], [name] Date of Service: 10/21/2015 3:45 PM Medical Record Number: [id number] Patient Account Number: [id number] Date of Birth/Sex: 08/11/1941 (75 y.o. Male) Treating RN: Ahmed Prima Primary Care Physician: Arnette Norris Other Clinician: Referring Physician: Arnette Norris Treating Physician/Extender: BURNS III, Charlean Sanfilippo in Treatment: 4 Wound Status Wound Number: 1 Primary Diabetic Wound/Ulcer of the Lower Etiology: Extremity Wound Location: Left Toe Great - Plantar Wound Status: Open Wounding Event: Blister Comorbid Type II Diabetes, Osteoarthritis, Date Acquired: 09/16/2015 History: Neuropathy Weeks Of Treatment: 4 Clustered Wound: No Wound Measurements Length: (cm) 0 % Reduction i Width: (cm) 0 % Reduction i Depth: (cm) 0 Epithelializa Area: (cm) 0 Tunneling: Volume: (cm) 0 Undermining: n Area: 100% n Volume: 100% tion: Large (67-100%) No No Wound Description Classification: Grade 2 Wound Margin: Flat and Intact Exudate Amount: Medium Exudate Type: Serous Exudate Color: amber Foul Odor After Cleansing: No Wound Bed Granulation Amount: None Present (0%) Exposed Structure Necrotic Amount: None Present (0%) Fascia Exposed: No Fat Layer Exposed: No Tendon Exposed: No Muscle Exposed: No Joint Exposed: No Bone Exposed: No Limited to Skin Breakdown Periwound Skin Texture Texture Color No Abnormalities Noted: No No Abnormalities Noted: No Callus: Yes Atrophie Blanche: No Crepitus: No Cyanosis: No Excoriation: No Ecchymosis: No Fluctuance: No Erythema: Yes Vannostrand, Fay L. ([id number]) Friable: No Erythema Location: Circumferential Induration: No Hemosiderin Staining: No Localized Edema: Yes Mottled: No Rash: No Pallor: No Scarring:  No Rubor: No Moisture Temperature / Pain No Abnormalities Noted: No Temperature: No Abnormality Dry / Scaly: No Tenderness on Palpation: Yes Maceration: No Moist: No Wound Preparation Ulcer Cleansing: Rinsed/Irrigated with Saline Topical Anesthetic Applied: Other: lidocaine 4%, Electronic Signature(s) Signed: 10/21/2015 4:31:17 PM By: Alric Quan Entered By: Alric Quan on 10/21/2015 15:59:51 Nicholas [name], [name] ([id number]) -------------------------------------------------------------------------------- Wound Assessment Details Patient Name: Nicholas [name], Nakeem L. Date of Service: 10/21/2015 3:45 PM Medical Record Number: [id number] Patient Account Number: [id number] Date of Birth/Sex: 1941/07/29 (75 y.o. Male) Treating RN: Ahmed Prima Primary Care Physician: Arnette Norris Other Clinician: Referring Physician: Arnette Norris Treating Physician/Extender: BURNS III, Charlean Sanfilippo in Treatment: 4 Wound Status Wound Number: 2 Primary Diabetic Wound/Ulcer of the Lower Etiology: Extremity Wound Location: Left Toe Second - Plantar Wound Status: Open Wounding Event: Blister Comorbid Type II Diabetes, Osteoarthritis, Date Acquired: 09/16/2015 History: Neuropathy Weeks Of Treatment: 4 Clustered Wound: No Photos Photo Uploaded By: Alric Quan on 10/21/2015 16:18:30 Wound Measurements Length: (cm) 0.3 Width: (cm) 0.3 Depth: (cm) 0.1 Area: (cm) 0.071 Volume: (cm) 0.007 % Reduction in Area: 92.5% % Reduction in Volume: 92.6% Epithelialization: Small (1-33%) Tunneling: No Undermining: No Wound Description Classification: Grade 2 Wound Margin: Flat and Intact Exudate Amount: Medium Exudate Type: Serous Exudate Color: amber Foul Odor After Cleansing: No Wound Bed Granulation Amount: Medium (34-66%) Exposed Structure Granulation Quality: Red Fascia Exposed: No Necrotic Amount: Medium (34-66%) Fat Layer Exposed: No Necrotic Quality: Eschar, Adherent Slough Tendon  Exposed: No Foskett, Maxx L. ([id number]) Muscle Exposed: No Joint Exposed: No Bone Exposed: No Limited to Skin Breakdown Periwound Skin Texture Texture Color No Abnormalities Noted: No No Abnormalities Noted: No Callus: Yes Atrophie Blanche: No Crepitus: No Cyanosis: No Excoriation: No Ecchymosis: No Fluctuance: No Erythema: Yes Friable: No Erythema Location: Circumferential Induration: No Hemosiderin Staining: No Localized Edema: No Mottled:  No Rash: No Pallor: No Scarring: No Rubor: No Moisture Temperature / Pain No Abnormalities Noted: No Temperature: No Abnormality Dry / Scaly: No Tenderness on Palpation: Yes Maceration: No Moist: Yes Wound Preparation Ulcer Cleansing: Rinsed/Irrigated with Saline Topical Anesthetic Applied: Other: lidocaine 4%, Treatment Notes Wound #2 (Left, Plantar Toe Second) 1. Cleansed with: Clean wound with Normal Saline 2. Anesthetic Topical Lidocaine 4% cream to wound bed prior to debridement 4. Dressing Applied: Aquacel Ag 5. Secondary Dressing Applied Kerlix/Conform 7. Secured with Tape Notes netting Electronic Signature(s) Signed: 10/21/2015 4:31:17 PM By: Alric Quan Entered By: Alric Quan on 10/21/2015 15:53:27 Salvia, Koleson L. ([id number]) Heninger, Nabeel L. ([id number]) -------------------------------------------------------------------------------- Wound Assessment Details Patient Name: Tackitt, Demetrick L. Date of Service: 10/21/2015 3:45 PM Medical Record Number: [id number] Patient Account Number: [id number] Date of Birth/Sex: [birth year] (75 y.o. Male) Treating RN: Ahmed Prima Primary Care Physician: Arnette Norris Other Clinician: Referring Physician: Arnette Norris Treating Physician/Extender: BURNS III, Charlean Sanfilippo in Treatment: 4 Wound Status Wound Number: 3 Primary Etiology: Trauma, Other Wound Location: Left, Midline, Posterior Foot Wound Status: Healed - Epithelialized Wounding Event: Blister Date  Acquired: 09/28/2015 Weeks Of Treatment: 3 Clustered Wound: No Photos Photo Uploaded By: Alric Quan on 10/21/2015 16:18:30 Wound Measurements Length: (cm) 0 % Reduction i Width: (cm) 0 % Reduction i Depth: (cm) 0 Area: (cm) 0 Volume: (cm) 0 n Area: 100% n Volume: 100% Wound Description Classification: Partial Thickness Periwound Skin Texture Texture Color No Abnormalities Noted: No No Abnormalities Noted: No Moisture No Abnormalities Noted: No Electronic Signature(s) Signed: 10/21/2015 4:31:17 PM By: [name], Tereso L. ([id number]) Entered By: Alric Quan on 10/21/2015 15:52:18 [place], Marquiz L. ([id number]) -------------------------------------------------------------------------------- Vitals Details Patient Name: Glatfelter, Maleki L. Date of Service: 10/21/2015 3:45 PM Medical Record Number: [id number] Patient Account Number: [id number] Date of Birth/Sex: [birth year] (75 y.o. Male) Treating RN: Ahmed Prima Primary Care Physician: Arnette Norris Other Clinician: Referring Physician: Arnette Norris Treating Physician/Extender: BURNS III, Charlean Sanfilippo in Treatment: 4 Vital Signs Time Taken: 15:48 Temperature (F): 97.8 Height (in): 72 Pulse (bpm): 74 Weight (lbs): 250 Respiratory Rate (breaths/min): 20 Body Mass Index (BMI): 33.9 Blood Pressure (mmHg): 152/67 Reference Range: 80 - 120 mg / dl Electronic Signature(s) Signed: 10/21/2015 4:31:17 PM By: Alric Quan Entered By: Alric Quan on 10/21/2015 15:50:53

## 2015-10-23 NOTE — Progress Notes (Signed)
Clapsaddle, Naftoli L. ([id number]) Visit Report for 10/21/2015 Chief Complaint Document Details Patient Name: Nicholas [name], [name] Date of Service: 10/21/2015 3:45 PM Medical Record Number: [id number] Patient Account Number: [id number] Date of Birth/Sex: 1941-06-17 (75 y.o. Male) Treating RN: Ahmed Prima Primary Care Physician: Arnette Norris Other Clinician: Referring Physician: Arnette Norris Treating Physician/Extender: BURNS III, Charlean Sanfilippo in Treatment: 4 Information Obtained from: Patient Chief Complaint Left toe ulceration. Electronic Signature(s) Signed: 10/22/2015 8:12:22 AM By: Loletha Grayer MD Entered By: Loletha Grayer on 10/22/2015 08:08:49 Nicholas [name], [name] ([id number]) -------------------------------------------------------------------------------- Debridement Details Patient Name: [name], [name] Date of Service: 10/21/2015 3:45 PM Medical Record Number: [id number] Patient Account Number: [id number] Date of Birth/Sex: Jul 21, 1941 (75 y.o. Male) Treating RN: Ahmed Prima Primary Care Physician: Arnette Norris Other Clinician: Referring Physician: Arnette Norris Treating Physician/Extender: BURNS III, Charlean Sanfilippo in Treatment: 4 Debridement Performed for Wound #2 Left,Plantar Toe Second Assessment: Performed By: Physician BURNS III, Teressa Senter., MD Debridement: Debridement Pre-procedure Yes Verification/Time Out Taken: Start Time: 16:00 Pain Control: Other : lidocaine 4% cream Level: Skin/Subcutaneous Tissue Total Area Debrided (L x 0.3 (cm) x 0.3 (cm) = 0.09 (cm) W): Tissue and other Viable, Non-Viable, Exudate, Fat, Fibrin/Slough, Subcutaneous material debrided: Instrument: Curette Bleeding: None End Time: 16:02 Procedural Pain: 0 Post Procedural Pain: 0 Response to Treatment: Procedure was tolerated well Post Debridement Measurements of Total Wound Length: (cm) 0.3 Width: (cm) 0.3 Depth: (cm) 0.2 Volume: (cm) 0.014 Post Procedure Diagnosis Same as  Pre-procedure Electronic Signature(s) Signed: 10/22/2015 8:12:22 AM By: Loletha Grayer MD Signed: 10/22/2015 5:10:55 PM By: Alric Quan Previous Signature: 10/21/2015 4:31:17 PM Version By: Alric Quan Entered By: Loletha Grayer on 10/22/2015 08:08:36 Nicholas [name], [name] ([id number]) -------------------------------------------------------------------------------- HPI Details Patient Name: [name], [name] Date of Service: 10/21/2015 3:45 PM Medical Record Number: [id number] Patient Account Number: [id number] Date of Birth/Sex: 28-Feb-1941 (75 y.o. Male) Treating RN: Ahmed Prima Primary Care Physician: Arnette Norris Other Clinician: Referring Physician: Arnette Norris Treating Physician/Extender: BURNS III, Charlean Sanfilippo in Treatment: 4 History of Present Illness HPI Description: Pleasant 75 year old with history of diabetes (hemoglobin A1c 6.1 in November 2016), peripheral neuropathy, and sleep apnea. No history of PAD. Left ABI 1.01. He developed blisters and subsequent ulcerations on the plantar aspect of his left first and second toe in early December 2016. Denies any trauma, unusual activity, or new footwear. Has been fitted for orthotics in the past but did not like them. He is ambulating per his baseline without difficulty. No claudication or rest pain. 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 X-ray of left foot 09/23/2015 showed no evidence for osteomyelitis. Completed a course of doxycycline. Performing dressing changes with Prisma. Awaiting orthotics at [facility]. He returns to clinic for follow-up and is without new complaints. No significant pain. No fever or chills. Minimal drainage. Electronic Signature(s) Signed: 10/22/2015 8:12:22 AM By: Loletha Grayer MD Entered By: Loletha Grayer on 10/22/2015 08:09:08 Nicholas [name], [name] ([id number]) -------------------------------------------------------------------------------- Physical Exam Details Patient  Name: [name], [name] Date of Service: 10/21/2015 3:45 PM Medical Record Number: [id number] Patient Account Number: [id number] Date of Birth/Sex: 11-13-1940 (75 y.o. Male) Treating RN: Ahmed Prima Primary Care Physician: Arnette Norris Other Clinician: Referring Physician: Arnette Norris Treating Physician/Extender: BURNS III, WALTER Weeks in Treatment: 4 Constitutional . Pulse regular. Respirations normal and unlabored. Afebrile. . Notes Left great toe and dorsal foot ulcerations healed. Left second toe plantar ulceration improved. Full- thickness. No exposed deep structures. No cellulitis. Palpable DP. Left ABI 1.01. New  partial thickness ulceration on the dorsum of the left foot. Electronic Signature(s) Signed: 10/22/2015 8:12:22 AM By: Loletha Grayer MD Entered By: Loletha Grayer on 10/22/2015 08:10:29 Nicholas [name], [name] ([id number]) -------------------------------------------------------------------------------- Physician Orders Details Patient Name: [name], [name] Date of Service: 10/21/2015 3:45 PM Medical Record Number: [id number] Patient Account Number: [id number] Date of Birth/Sex: 1941/03/02 (75 y.o. Male) Treating RN: Ahmed Prima Primary Care Physician: Arnette Norris Other Clinician: Referring Physician: Arnette Norris Treating Physician/Extender: BURNS III, Charlean Sanfilippo in Treatment: 4 Verbal / Phone Orders: Yes Clinician: Carolyne Fiscal, Debi Read Back and Verified: Yes Diagnosis Coding Wound Cleansing Wound #2 Left,Plantar Toe Second o Clean wound with Normal Saline. Anesthetic Wound #2 Left,Plantar Toe Second o Topical Lidocaine 4% cream applied to wound bed prior to debridement Primary Wound Dressing Wound #2 Left,Plantar Toe Second o Aquacel Ag Secondary Dressing Wound #2 Left,Plantar Toe Second o Gauze and Kerlix/Conform Dressing Change Frequency Wound #2 Left,Plantar Toe Second o Change dressing every other day. Follow-up Appointments Wound #2  Left,Plantar Toe Second o Return Appointment in 1 week. Electronic Signature(s) Signed: 10/21/2015 4:31:17 PM By: Alric Quan Signed: 10/22/2015 8:12:22 AM By: Loletha Grayer MD Entered By: Alric Quan on 10/21/2015 16:02:22 Nicholas [name], [name] ([id number]) -------------------------------------------------------------------------------- Problem List Details Patient Name: Seyfried, Yerik L. Date of Service: 10/21/2015 3:45 PM Medical Record Number: [id number] Patient Account Number: [id number] Date of Birth/Sex: [birth year] (75 y.o. Male) Treating RN: Ahmed Prima Primary Care Physician: Arnette Norris Other Clinician: Referring Physician: Arnette Norris Treating Physician/Extender: BURNS III, Charlean Sanfilippo in Treatment: 4 Active Problems ICD-10 Encounter Code Description Active Date Diagnosis E11.621 Type 2 diabetes mellitus with foot ulcer 09/23/2015 Yes S91.105S Unspecified open wound of left lesser toe(s) without 09/23/2015 Yes damage to nail, sequela E11.40 Type 2 diabetes mellitus with diabetic neuropathy, 09/23/2015 Yes unspecified Inactive Problems Resolved Problems ICD-10 Code Description Active Date Resolved Date S91.102D Unspecified open wound of left great toe without damage 09/23/2015 09/23/2015 to nail, subsequent encounter L03.032 Cellulitis of left toe 09/23/2015 09/23/2015 Electronic Signature(s) Signed: 10/22/2015 8:12:22 AM By: Loletha Grayer MD Entered By: Loletha Grayer on 10/22/2015 08:08:13 Nicholas [name], [name] ([id number]) -------------------------------------------------------------------------------- Progress Note Details Patient Name: Nicholas [name], [name] Date of Service: 10/21/2015 3:45 PM Medical Record Number: [id number] Patient Account Number: [id number] Date of Birth/Sex: July 12, 1941 (75 y.o. Male) Treating RN: Ahmed Prima Primary Care Physician: Arnette Norris Other Clinician: Referring Physician: Arnette Norris Treating Physician/Extender: BURNS III,  Charlean Sanfilippo in Treatment: 4 Subjective Chief Complaint Information obtained from Patient Left toe ulceration. History of Present Illness (HPI) Pleasant 75 year old with history of diabetes (hemoglobin A1c 6.1 in November 2016), peripheral neuropathy, and sleep apnea. No history of PAD. Left ABI 1.01. He developed blisters and subsequent ulcerations on the plantar aspect of his left first and second toe in early December 2016. Denies any trauma, unusual activity, or new footwear. Has been fitted for orthotics in the past but did not like them. He is ambulating per his baseline without difficulty. No claudication or rest pain. 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 X-ray of left foot 09/23/2015 showed no evidence for osteomyelitis. Completed a course of doxycycline. Performing dressing changes with Prisma. Awaiting orthotics at [facility]. He returns to clinic for follow-up and is without new complaints. No significant pain. No fever or chills. Minimal drainage. Objective Constitutional Pulse regular. Respirations normal and unlabored. Afebrile. Vitals Time Taken: 3:48 PM, Height: 72 in, Weight: 250 lbs, BMI: 33.9, Temperature: 97.8 F, Pulse: 74 bpm, Respiratory Rate: 20 breaths/min,  Blood Pressure: 152/67 mmHg. General Notes: Left great toe and dorsal foot ulcerations healed. Left second toe plantar ulceration improved. Full-thickness. No exposed deep structures. No cellulitis. Palpable DP. Left ABI 1.01. New partial thickness ulceration on the dorsum of the left foot. Integumentary (Hair, Skin) Nicholas [name], [name] ([id number]) Wound #1 status is Open. Original cause of wound was Blister. The wound is located on the [employer]. The wound measures 0cm length x 0cm width x 0cm depth; 0cm^2 area and 0cm^3 volume. The wound is limited to skin breakdown. There is no tunneling or undermining noted. There is a medium amount of serous drainage noted. The wound margin is  flat and intact. There is no granulation within the wound bed. There is no necrotic tissue within the wound bed. The periwound skin appearance exhibited: Callus, Localized Edema, Erythema. The periwound skin appearance did not exhibit: Crepitus, Excoriation, Fluctuance, Friable, Induration, Rash, Scarring, Dry/Scaly, Maceration, Moist, Atrophie Blanche, Cyanosis, Ecchymosis, Hemosiderin Staining, Mottled, Pallor, Rubor. The surrounding wound skin color is noted with erythema which is circumferential. Periwound temperature was noted as No Abnormality. The periwound has tenderness on palpation. Wound #2 status is Open. Original cause of wound was Blister. The wound is located on the Left,Plantar Toe Second. The wound measures 0.3cm length x 0.3cm width x 0.1cm depth; 0.071cm^2 area and 0.007cm^3 volume. The wound is limited to skin breakdown. There is no tunneling or undermining noted. There is a medium amount of serous drainage noted. The wound margin is flat and intact. There is medium (34-66%) red granulation within the wound bed. There is a medium (34-66%) amount of necrotic tissue within the wound bed including Eschar and Adherent Slough. The periwound skin appearance exhibited: Callus, Moist, Erythema. The periwound skin appearance did not exhibit: Crepitus, Excoriation, Fluctuance, Friable, Induration, Localized Edema, Rash, Scarring, Dry/Scaly, Maceration, Atrophie Blanche, Cyanosis, Ecchymosis, Hemosiderin Staining, Mottled, Pallor, Rubor. The surrounding wound skin color is noted with erythema which is circumferential. Periwound temperature was noted as No Abnormality. The periwound has tenderness on palpation. Wound #3 status is Healed - Epithelialized. Original cause of wound was Blister. The wound is located on the [place]. The wound measures 0cm length x 0cm width x 0cm depth; 0cm^2 area and 0cm^3 volume. Assessment Active Problems ICD-10 E11.621 - Type 2  diabetes mellitus with foot ulcer S91.105S - Unspecified open wound of left lesser toe(s) without damage to nail, sequela E11.40 - Type 2 diabetes mellitus with diabetic neuropathy, unspecified Left second toe plantar ulceration. Healed left great toe and dorsal foot ulcerations. Procedures Nicholas [name], [name] ([id number]) Wound #2 Wound #2 is a Diabetic Wound/Ulcer of the Lower Extremity located on the Left,Plantar Toe Second . There was a Skin/Subcutaneous Tissue Debridement [id number]) debridement with total area of 0.09 sq cm performed by BURNS III, Teressa Senter., MD. with the following instrument(s): Curette to remove Viable and Non-Viable tissue/material including Exudate, Fat, Fibrin/Slough, and Subcutaneous after achieving pain control using Other (lidocaine 4% cream). A time out was conducted prior to the start of the procedure. There was no bleeding. The procedure was tolerated well with a pain level of 0 throughout and a pain level of 0 following the procedure. Post Debridement Measurements: 0.3cm length x 0.3cm width x 0.2cm depth; 0.014cm^3 volume. Post procedure Diagnosis Wound #2: Same as Pre-Procedure Plan Wound Cleansing: Wound #2 Left,Plantar Toe Second: Clean wound with Normal Saline. Anesthetic: Wound #2 Left,Plantar Toe Second: Topical Lidocaine 4% cream applied to wound bed prior to debridement Primary Wound Dressing: Wound #  2 Left,Plantar Toe Second: Aquacel Ag Secondary Dressing: Wound #2 Left,Plantar Toe Second: Gauze and Kerlix/Conform Dressing Change Frequency: Wound #2 Left,Plantar Toe Second: Change dressing every other day. Follow-up Appointments: Wound #2 Left,Plantar Toe Second: Return Appointment in 1 week. Continue with silver alginate dressing changes and offloading measures. Awaiting new orthotics from [facility]. Nicholas [name], [name] ([id number]) Electronic Signature(s) Signed: 10/22/2015 8:12:22 AM By: Loletha Grayer MD Entered By: Loletha Grayer on  10/22/2015 08:11:43 Nicholas [name], [name] ([id number]) -------------------------------------------------------------------------------- SuperBill Details Patient Name: Nicholas [name], [name] Date of Service: 10/21/2015 Medical Record Number: [id number] Patient Account Number: [id number] Date of Birth/Sex: July 26, 1941 (75 y.o. Male) Treating RN: Ahmed Prima Primary Care Physician: Arnette Norris Other Clinician: Referring Physician: Arnette Norris Treating Physician/Extender: BURNS III, Charlean Sanfilippo in Treatment: 4 Diagnosis Coding ICD-10 Codes Code Description E11.621 Type 2 diabetes mellitus with foot ulcer S91.105S Unspecified open wound of left lesser toe(s) without damage to nail, sequela E11.40 Type 2 diabetes mellitus with diabetic neuropathy, unspecified Facility Procedures CPT4: Description Modifier Quantity Code [id number] - DEB SUBQ TISSUE 20 SQ CM/< 1 ICD-10 Description Diagnosis S91.105S Unspecified open wound of left lesser toe(s) without damage to nail, sequela Physician Procedures CPT4: Description Modifier Quantity Code [id number] - WC PHYS SUBQ TISS 20 SQ CM 1 ICD-10 Description Diagnosis S91.105S Unspecified open wound of left lesser toe(s) without damage to nail, sequela Electronic Signature(s) Signed: 10/22/2015 8:12:22 AM By: Loletha Grayer MD Entered By: Loletha Grayer on 10/22/2015 08:11:58

## 2015-10-28 ENCOUNTER — Encounter: Payer: Medicare Other | Admitting: Surgery

## 2015-10-28 DIAGNOSIS — L97529 Non-pressure chronic ulcer of other part of left foot with unspecified severity: Secondary | ICD-10-CM | POA: Diagnosis not present

## 2015-10-28 DIAGNOSIS — S91105S Unspecified open wound of left lesser toe(s) without damage to nail, sequela: Secondary | ICD-10-CM | POA: Diagnosis not present

## 2015-10-28 DIAGNOSIS — E1140 Type 2 diabetes mellitus with diabetic neuropathy, unspecified: Secondary | ICD-10-CM | POA: Diagnosis not present

## 2015-10-28 DIAGNOSIS — E11621 Type 2 diabetes mellitus with foot ulcer: Secondary | ICD-10-CM | POA: Diagnosis not present

## 2015-10-29 ENCOUNTER — Other Ambulatory Visit: Payer: Self-pay

## 2015-10-29 MED ORDER — FLUTICASONE PROPIONATE 50 MCG/ACT NA SUSP: 2.0000 | Freq: Every day | NASAL | Status: DC

## 2015-10-29 NOTE — Telephone Encounter (Signed)
Nicholas [name],(DPR signed) left v/m requesting refill fluticasone nasal spray; last refilled # 16 g x 6 on 10/08/14. Last seen f/u 05/28/15. Spoke with [name] and advised refill done.[facility].

## 2015-10-29 NOTE — Progress Notes (Signed)
Nicholas [name], [name] ([id number]) Visit Report for 10/28/2015 Chief Complaint Document Details Patient Name: Nicholas [name], [name] Date of Service: 10/28/2015 3:30 PM Medical Record Number: [id number] Patient Account Number: [id number] Date of Birth/Sex: 1941/04/05 (75 y.o. Male) Treating RN: Montey Hora Primary Care Physician: Arnette Norris Other Clinician: Referring Physician: Arnette Norris Treating Physician/Extender: BURNS III, Charlean Sanfilippo in Treatment: 5 Information Obtained from: Patient Chief Complaint Left toe ulceration. Electronic Signature(s) Signed: 10/28/2015 4:16:11 PM By: Loletha Grayer MD Entered By: Loletha Grayer on 10/28/2015 16:12:50 Nicholas [name], [name] ([id number]) -------------------------------------------------------------------------------- HPI Details Patient Name: Heeter, Larz L. Date of Service: 10/28/2015 3:30 PM Medical Record Number: [id number] Patient Account Number: [id number] Date of Birth/Sex: 08/29/1941 (75 y.o. Male) Treating RN: Montey Hora Primary Care Physician: Arnette Norris Other Clinician: Referring Physician: Arnette Norris Treating Physician/Extender: BURNS III, Charlean Sanfilippo in Treatment: 5 History of Present Illness HPI Description: Pleasant 75 year old with history of diabetes (hemoglobin A1c 6.1 in November 2016), peripheral neuropathy, and sleep apnea. No history of PAD. Left ABI 1.01. He developed blisters and subsequent ulcerations on the plantar aspect of his left first and second toe in early December 2016. Denies any trauma, unusual activity, or new footwear. Has been fitted for orthotics in the past but did not like them. He is ambulating per his baseline without difficulty. No claudication or rest pain. 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 X-ray of left foot 09/23/2015 showed no evidence for osteomyelitis. Completed a course of doxycycline. Performing dressing changes with Prisma. Awaiting orthotics at [facility]. He returns  to clinic for follow-up and is without new complaints. No significant pain. No fever or chills. No drainage. Electronic Signature(s) Signed: 10/28/2015 4:16:11 PM By: Loletha Grayer MD Entered By: Loletha Grayer on 10/28/2015 16:13:03 Nicholas [name], [name] ([id number]) -------------------------------------------------------------------------------- Physical Exam Details Patient Name: Nicholas [name], [name] Date of Service: 10/28/2015 3:30 PM Medical Record Number: [id number] Patient Account Number: [id number] Date of Birth/Sex: [birth year] (75 y.o. Male) Treating RN: Montey Hora Primary Care Physician: Arnette Norris Other Clinician: Referring Physician: Arnette Norris Treating Physician/Extender: BURNS III, Arslan Kier Weeks in Treatment: 5 Constitutional . Pulse regular. Respirations normal and unlabored. Afebrile. . Notes Left great toe and dorsal foot ulcerations remain healed. Left second toe plantar ulceration his re- epithelialize. No drainage. No evidence for infection. Palpable DP. Left ABI 1.01. Electronic Signature(s) Signed: 10/28/2015 4:16:11 PM By: Loletha Grayer MD Entered By: Loletha Grayer on 10/28/2015 16:13:41 Nicholas [name], [name] ([id number]) -------------------------------------------------------------------------------- Physician Orders Details Patient Name: Nicholas [name], [name] Date of Service: 10/28/2015 3:30 PM Medical Record Number: [id number] Patient Account Number: [id number] Date of Birth/Sex: Dec 06, 1940 (75 y.o. Male) Treating RN: Montey Hora Primary Care Physician: Arnette Norris Other Clinician: Referring Physician: Arnette Norris Treating Physician/Extender: BURNS III, Charlean Sanfilippo in Treatment: 5 Verbal / Phone Orders: Yes Clinician: Montey Hora Read Back and Verified: Yes Diagnosis Coding Discharge From [facility] Services o Discharge from [place] Signature(s) Signed: 10/28/2015 4:12:28 PM By: Loletha Grayer MD Signed: 10/28/2015 5:35:09 PM By:  Montey Hora Entered By: Montey Hora on 10/28/2015 15:52:28 Nicholas [name], [name] ([id number]) -------------------------------------------------------------------------------- Problem List Details Patient Name: Nicholas [name], [name] Date of Service: 10/28/2015 3:30 PM Medical Record Number: [id number] Patient Account Number: [id number] Date of Birth/Sex: [birth year] (75 y.o. Male) Treating RN: Montey Hora Primary Care Physician: Arnette Norris Other Clinician: Referring Physician: Arnette Norris Treating Physician/Extender: BURNS III, Charlean Sanfilippo in Treatment: 5 Active Problems ICD-10 Encounter Code Description Active Date Diagnosis E11.621 Type 2 diabetes mellitus  with foot ulcer 09/23/2015 Yes S91.105S Unspecified open wound of left lesser toe(s) without 09/23/2015 Yes damage to nail, sequela E11.40 Type 2 diabetes mellitus with diabetic neuropathy, 09/23/2015 Yes unspecified Inactive Problems Resolved Problems ICD-10 Code Description Active Date Resolved Date S91.102D Unspecified open wound of left great toe without damage 09/23/2015 09/23/2015 to nail, subsequent encounter L03.032 Cellulitis of left toe 09/23/2015 09/23/2015 Electronic Signature(s) Signed: 10/28/2015 4:16:11 PM By: Loletha Grayer MD Entered By: Loletha Grayer on 10/28/2015 16:12:43 Nicholas [name], [name] ([id number]) -------------------------------------------------------------------------------- Progress Note Details Patient Name: Nicholas [name], [name] Date of Service: 10/28/2015 3:30 PM Medical Record Number: [id number] Patient Account Number: [id number] Date of Birth/Sex: [birth year] (75 y.o. Male) Treating RN: Montey Hora Primary Care Physician: Arnette Norris Other Clinician: Referring Physician: Arnette Norris Treating Physician/Extender: BURNS III, Charlean Sanfilippo in Treatment: 5 Subjective Chief Complaint Information obtained from Patient Left toe ulceration. History of Present Illness (HPI) Pleasant 75 year old with  history of diabetes (hemoglobin A1c 6.1 in November 2016), peripheral neuropathy, and sleep apnea. No history of PAD. Left ABI 1.01. He developed blisters and subsequent ulcerations on the plantar aspect of his left first and second toe in early December 2016. Denies any trauma, unusual activity, or new footwear. Has been fitted for orthotics in the past but did not like them. He is ambulating per his baseline without difficulty. No claudication or rest pain. 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 X-ray of left foot 09/23/2015 showed no evidence for osteomyelitis. Completed a course of doxycycline. Performing dressing changes with Prisma. Awaiting orthotics at [facility]. He returns to clinic for follow-up and is without new complaints. No significant pain. No fever or chills. No drainage. Objective Constitutional Pulse regular. Respirations normal and unlabored. Afebrile. Vitals Time Taken: 3:35 PM, Height: 72 in, Weight: 250 lbs, BMI: 33.9, Temperature: 98.0 F, Pulse: 73 bpm, Respiratory Rate: 18 breaths/min, Blood Pressure: 149/62 mmHg. General Notes: Left great toe and dorsal foot ulcerations remain healed. Left second toe plantar ulceration his re-epithelialize. No drainage. No evidence for infection. Palpable DP. Left ABI 1.01. Integumentary (Hair, Skin) Wound #2 status is Open. Original cause of wound was Blister. The wound is located on the Left,Plantar Nicholas [name], [name] ([id number]) Toe Second. The wound measures 0cm length x 0cm width x 0cm depth; 0cm^2 area and 0cm^3 volume. Assessment Active Problems ICD-10 E11.621 - Type 2 diabetes mellitus with foot ulcer S91.105S - Unspecified open wound of left lesser toe(s) without damage to nail, sequela E11.40 - Type 2 diabetes mellitus with diabetic neuropathy, unspecified Healed left foot and toe ulcerations. Plan Discharge From [facility] Services: Discharge from [place] E[facility] cream. Will receive new orthotics  from [facility] next week. Return to clinic when necessary. Electronic Signature(s) Signed: 10/28/2015 4:16:11 PM By: Loletha Grayer MD Entered By: Loletha Grayer on 10/28/2015 16:14:39 Nicholas [name], [name] ([id number]) -------------------------------------------------------------------------------- SuperBill Details Patient Name: Nicholas [name], Ivery L. Date of Service: 10/28/2015 Medical Record Number: [id number] Patient Account Number: [id number] Date of Birth/Sex: 10-Oct-1941 (75 y.o. Male) Treating RN: Montey Hora Primary Care Physician: Arnette Norris Other Clinician: Referring Physician: Arnette Norris Treating Physician/Extender: BURNS III, Charlean Sanfilippo in Treatment: 5 Diagnosis Coding ICD-10 Codes Code Description E11.621 Type 2 diabetes mellitus with foot ulcer S91.105S Unspecified open wound of left lesser toe(s) without damage to nail, sequela E11.40 Type 2 diabetes mellitus with diabetic neuropathy, unspecified Facility Procedures CPT4 Code: [id number] Description: [id number] - WOUND CARE VISIT-LEV 2 EST PT Modifier: Quantity: 1 Physician Procedures CPT4 Code: [id number] Description: [phone] -  WC PHYS LEVEL 2 - EST PT ICD-10 Description Diagnosis E11.40 Type 2 diabetes mellitus with diabetic neuropathy Modifier: , unspecified Quantity: 1 Electronic Signature(s) Signed: 10/28/2015 4:16:11 PM By: Loletha Grayer MD Entered By: Loletha Grayer on 10/28/2015 16:14:57

## 2015-10-29 NOTE — Progress Notes (Signed)
Nicholas [name], [name] ([id number]) Visit Report for 10/28/2015 Arrival Information Details Patient Name: [name], [name] Date of Service: 10/28/2015 3:30 PM Medical Record Number: [id number] Patient Account Number: [id number] Date of Birth/Sex: 04/23/1941 (75 y.o. Male) Treating RN: Montey Hora Primary Care Physician: Arnette Norris Other Clinician: Referring Physician: Arnette Norris Treating Physician/Extender: BURNS III, Charlean Sanfilippo in Treatment: 5 Visit Information History Since Last Visit Added or deleted any medications: No Patient Arrived: Ambulatory Any new allergies or adverse reactions: No Arrival Time: 15:34 Had a fall or experienced change in No Accompanied By: self activities of daily living that may affect Transfer Assistance: None risk of falls: Patient Identification Verified: Yes Signs or symptoms of abuse/neglect since last No Secondary Verification Process Yes visito Completed: Hospitalized since last visit: No Patient Requires Transmission-Based No Pain Present Now: No Precautions: Patient Has Alerts: Yes Electronic Signature(s) Signed: 10/28/2015 5:35:09 PM By: Montey Hora Entered By: Montey Hora on 10/28/2015 15:34:55 Nicholas [name], [name] ([id number]) -------------------------------------------------------------------------------- Clinic Level of Care Assessment Details Patient Name: [name], [name] Date of Service: 10/28/2015 3:30 PM Medical Record Number: [id number] Patient Account Number: [id number] Date of Birth/Sex: 1941-10-09 (75 y.o. Male) Treating RN: Montey Hora Primary Care Physician: Arnette Norris Other Clinician: Referring Physician: Arnette Norris Treating Physician/Extender: BURNS III, Charlean Sanfilippo in Treatment: 5 Clinic Level of Care Assessment Items TOOL 4 Quantity Score []  - Use when only an EandM is performed on FOLLOW-UP visit 0 ASSESSMENTS - Nursing Assessment / Reassessment X - Reassessment of Co-morbidities (includes updates in patient status)  1 10 X - Reassessment of Adherence to Treatment Plan 1 5 ASSESSMENTS - Wound and Skin Assessment / Reassessment X - Simple Wound Assessment / Reassessment - one wound 1 5 []  - Complex Wound Assessment / Reassessment - multiple wounds 0 []  - Dermatologic / Skin Assessment (not related to wound area) 0 ASSESSMENTS - Focused Assessment []  - Circumferential Edema Measurements - multi extremities 0 []  - Nutritional Assessment / Counseling / Intervention 0 X - Lower Extremity Assessment (monofilament, tuning fork, pulses) 1 5 []  - Peripheral Arterial Disease Assessment (using hand held doppler) 0 ASSESSMENTS - Ostomy and/or Continence Assessment and Care []  - Incontinence Assessment and Management 0 []  - Ostomy Care Assessment and Management (repouching, etc.) 0 PROCESS - Coordination of Care X - Simple Patient / Family Education for ongoing care 1 15 []  - Complex (extensive) Patient / Family Education for ongoing care 0 []  - Staff obtains Programmer, systems, Records, Test Results / Process Orders 0 []  - Staff telephones HHA, Nursing Homes / Clarify orders / etc 0 []  - Routine Transfer to another Facility (non-emergent condition) 0 Nicholas [name], [name] ([id number]) []  - Routine Hospital Admission (non-emergent condition) 0 []  - New Admissions / Biomedical engineer / Ordering NPWT, Apligraf, etc. 0 []  - Emergency Hospital Admission (emergent condition) 0 X - Simple Discharge Coordination 1 10 []  - Complex (extensive) Discharge Coordination 0 PROCESS - Special Needs []  - Pediatric / Minor Patient Management 0 []  - Isolation Patient Management 0 []  - Hearing / Language / Visual special needs 0 []  - Assessment of Community assistance (transportation, D/C planning, etc.) 0 []  - Additional assistance / Altered mentation 0 []  - Support Surface(s) Assessment (bed, cushion, seat, etc.) 0 INTERVENTIONS - Wound Cleansing / Measurement X - Simple Wound Cleansing - one wound 1 5 []  - Complex Wound Cleansing -  multiple wounds 0 X - Wound Imaging (photographs - any number of wounds) 1 5 []  - Wound Tracing (instead of photographs)  0 X - Simple Wound Measurement - one wound 1 5 []  - Complex Wound Measurement - multiple wounds 0 INTERVENTIONS - Wound Dressings []  - Small Wound Dressing one or multiple wounds 0 []  - Medium Wound Dressing one or multiple wounds 0 []  - Large Wound Dressing one or multiple wounds 0 []  - Application of Medications - topical 0 []  - Application of Medications - injection 0 INTERVENTIONS - Miscellaneous []  - External ear exam 0 Nicholas [name], [name] ([id number]) []  - Specimen Collection (cultures, biopsies, blood, body fluids, etc.) 0 []  - Specimen(s) / Culture(s) sent or taken to Lab for analysis 0 []  - Patient Transfer (multiple staff / Harrel Lemon Lift / Similar devices) 0 []  - Simple Staple / Suture removal (25 or less) 0 []  - Complex Staple / Suture removal (26 or more) 0 []  - Hypo / Hyperglycemic Management (close monitor of Blood Glucose) 0 []  - Ankle / Brachial Index (ABI) - do not check if billed separately 0 X - Vital Signs 1 5 Has the patient been seen at the hospital within the last three years: Yes Total Score: 70 Level Of Care: New/Established - Level 2 Electronic Signature(s) Signed: 10/28/2015 5:35:09 PM By: Montey Hora Entered By: Montey Hora on 10/28/2015 15:55:03 Nicholas [name], [name] ([id number]) -------------------------------------------------------------------------------- Encounter Discharge Information Details Patient Name: [name], [name] Date of Service: 10/28/2015 3:30 PM Medical Record Number: [id number] Patient Account Number: [id number] Date of Birth/Sex: [birth year] (75 y.o. Male) Treating RN: Montey Hora Primary Care Physician: Arnette Norris Other Clinician: Referring Physician: Arnette Norris Treating Physician/Extender: BURNS III, Charlean Sanfilippo in Treatment: 5 Encounter Discharge Information Items Schedule Follow-up Appointment: No Medication  Reconciliation completed and provided to Patient/Care No [name]: Provided on Clinical Summary of Care: 10/28/2015 Form Type Recipient Paper Patient [facility] Electronic Signature(s) Signed: 10/28/2015 3:55:37 PM By: Ruthine Dose Entered By: Ruthine Dose on 10/28/2015 15:55:37 Nicholas [name], [name] ([id number]) -------------------------------------------------------------------------------- Lower Extremity Assessment Details Patient Name: Nicholas [name], [name] Date of Service: 10/28/2015 3:30 PM Medical Record Number: [id number] Patient Account Number: [id number] Date of Birth/Sex: 1940/11/15 (75 y.o. Male) Treating RN: Montey Hora Primary Care Physician: Arnette Norris Other Clinician: Referring Physician: Arnette Norris Treating Physician/Extender: BURNS III, Charlean Sanfilippo in Treatment: 5 Edema Assessment Assessed: [Left: No] [Right: No] Edema: [Left: N] [Right: o] Vascular Assessment Pulses: Posterior Tibial Dorsalis Pedis Palpable: [Left:Yes] Extremity colors, hair growth, and conditions: Extremity Color: [Left:Normal] Hair Growth on Extremity: [Left:Yes] Temperature of Extremity: [Left:Warm] Capillary Refill: [Left:< 3 seconds] Electronic Signature(s) Signed: 10/28/2015 5:35:09 PM By: Montey Hora Entered By: Montey Hora on 10/28/2015 15:40:42 Nicholas [name], [name] ([id number]) -------------------------------------------------------------------------------- [place] Details Patient Name: Nicholas [name], [name] Date of Service: 10/28/2015 3:30 PM Medical Record Number: [id number] Patient Account Number: [id number] Date of Birth/Sex: 06-16-1941 (75 y.o. Male) Treating RN: Montey Hora Primary Care Physician: Arnette Norris Other Clinician: Referring Physician: Arnette Norris Treating Physician/Extender: BURNS III, Charlean Sanfilippo in Treatment: 5 Active Inactive Electronic Signature(s) Signed: 10/28/2015 5:35:09 PM By: Montey Hora Entered By: Montey Hora on 10/28/2015  15:55:24 Nicholas [name], [name] ([id number]) -------------------------------------------------------------------------------- Patient/Caregiver Education Details Patient Name: Nicholas [name], [name] Date of Service: 10/28/2015 3:30 PM Medical Record Number: [id number] Patient Account Number: [id number] Date of Birth/Gender: 02-May-1941 (75 y.o. Male) Treating RN: Montey Hora Primary Care Physician: Arnette Norris Other Clinician: Referring Physician: Arnette Norris Treating Physician/Extender: BURNS III, Charlean Sanfilippo in Treatment: 5 Education Assessment Education Provided To: Patient Education Topics Provided Wound/Skin Impairment: Handouts: Other: skin care of newly healed ulcer site Methods: Demonstration, Explain/Verbal Responses:  State content correctly Electronic Signature(s) Signed: 10/28/2015 5:35:09 PM By: Montey Hora Entered By: Montey Hora on 10/28/2015 15:56:03 Nicholas [name], [name] ([id number]) -------------------------------------------------------------------------------- Wound Assessment Details Patient Name: Nicholas [name], Nicholas [name] L. Date of Service: 10/28/2015 3:30 PM Medical Record Number: [id number] Patient Account Number: [id number] Date of Birth/Sex: Dec 27, 1940 (75 y.o. Male) Treating RN: Montey Hora Primary Care Physician: Arnette Norris Other Clinician: Referring Physician: Arnette Norris Treating Physician/Extender: BURNS III, Charlean Sanfilippo in Treatment: 5 Wound Status Wound Number: 2 Primary Diabetic Wound/Ulcer of the Lower Etiology: Extremity Wound Location: Left, Plantar Toe Second Wound Status: Open Wounding Event: Blister Date Acquired: 09/16/2015 Weeks Of Treatment: 5 Clustered Wound: No Photos Photo Uploaded By: Montey Hora on 10/28/2015 16:39:15 Wound Measurements Length: (cm) 0 % Reduction i Width: (cm) 0 % Reduction i Depth: (cm) 0 Area: (cm) 0 Volume: (cm) 0 n Area: 100% n Volume: 100% Wound Description Classification: Grade 2 Periwound Skin Texture Texture  Color No Abnormalities Noted: No No Abnormalities Noted: No Moisture No Abnormalities Noted: No Electronic Signature(s) Signed: 10/28/2015 5:35:09 PM By: Jena Gauss, Glenwood L. ([id number]) Entered By: Montey Hora on 10/28/2015 15:40:49 Dittus, Sim L. ([id number]) -------------------------------------------------------------------------------- Vitals Details Patient Name: Bhullar, Willem L. Date of Service: 10/28/2015 3:30 PM Medical Record Number: [id number] Patient Account Number: [id number] Date of Birth/Sex: 12-Apr-1941 (75 y.o. Male) Treating RN: Montey Hora Primary Care Physician: Arnette Norris Other Clinician: Referring Physician: Arnette Norris Treating Physician/Extender: BURNS III, Charlean Sanfilippo in Treatment: 5 Vital Signs Time Taken: 15:35 Temperature (F): 98.0 Height (in): 72 Pulse (bpm): 73 Weight (lbs): 250 Respiratory Rate (breaths/min): 18 Body Mass Index (BMI): 33.9 Blood Pressure (mmHg): 149/62 Reference Range: 80 - 120 mg / dl Electronic Signature(s) Signed: 10/28/2015 5:35:09 PM By: Montey Hora Entered By: Montey Hora on 10/28/2015 15:36:16

## 2015-11-05 ENCOUNTER — Ambulatory Visit: Payer: Self-pay | Admitting: General Surgery

## 2015-11-05 DIAGNOSIS — K432 Incisional hernia without obstruction or gangrene: Secondary | ICD-10-CM | POA: Diagnosis not present

## 2015-11-05 NOTE — H&P (Signed)
Nicholas [name] 11/05/2015 9:51 AM Location: [facility] Patient #: [id number] DOB: 12/09/1940 Married / Language: English / Race: White Male  History of Present Illness (Starlet Gallentine J. Dallyn Bergland MD; 11/05/2015 10:30 AM) The patient is a 74 year old male.   Note:He is an established patient with known incisional hernias and a previous left abdominal colostomy site and in the periumbilical region. He underwent a Hartmann procedure in March 2013 for perforated sigmoid diverticulitis. He subsequently had colostomy closure December 2013. He was seen approximately 2 years ago with an incisional hernia at the previous colostomy site and decided not to have surgery at that time. He returns now because her hernia is getting larger and is intermittently uncomfortable. No obstruction symptoms. He continues to work full-time.  His past medical history is notable for diabetes mellitus with neuropathy. He also has COPD and obstructive sleep apnea. He has hyperlipidemia. He also has BPH.  Other Problems (Sonya Bynum, CMA; 11/05/2015 9:51 AM) Diabetes Mellitus  Diagnostic Studies History (Sonya Bynum, CMA; 11/05/2015 9:51 AM) Colonoscopy 1-5 years ago  Allergies (Sonya Bynum, CMA; 11/05/2015 9:52 AM) Sudafed *NASAL AGENTS - SYSTEMIC AND TOPICAL*  Medication History (Sonya Bynum, CMA; 11/05/2015 9:52 AM) Ventolin HFA (108 (90 Base)MCG/ACT Aerosol Soln, Inhalation) Active. Fluticasone Propionate (50MCG/ACT Suspension, Nasal) Active. Gabapentin (400MG Capsule, Oral) Active. GlipiZIDE ER (5MG Tablet ER 24HR, Oral) Active. MetFORMIN HCl (500MG Tablet, Oral) Active. Tamsulosin HCl (0.4MG Capsule, Oral) Active. Doxycycline Hyclate (100MG Tablet, Oral) Active. Blood Glucose Monitor System (w/Device Kit,) Active. Naproxen Sodium (220MG Tablet, Oral) Active. Aspirin (81MG Tablet DR, Oral) Active. TraMADol HCl (50MG Tablet, Oral) Active. Anoro Ellipta (62.5-25MCG/INH Aero Pow Br Act,  Inhalation) Active. Medications Reconciled  Social History (Sonya Bynum, CMA; 11/05/2015 9:51 AM) Alcohol use Occasional alcohol use. Caffeine use Coffee. Tobacco use Former smoker.     Review of Systems (Sonya Bynum CMA; 11/05/2015 9:51 AM) Skin Present- New Lesions. Not Present- Change in Wart/Mole, Dryness, Hives, Jaundice, Non-Healing Wounds, Rash and Ulcer.  Vitals (Sonya Bynum CMA; 11/05/2015 9:52 AM) 11/05/2015 9:52 AM Weight: 252 lb Height: 72in Body Surface Area: 2.35 m Body Mass Index: 34.18 kg/m  Temp.: 97F(Temporal)  Pulse: 78 (Regular)  BP: 124/78 (Sitting, Left Arm, Standard)      Physical Exam (Adriene Padula J. Dave Mannes MD; 11/05/2015 10:31 AM)  The physical exam findings are as follows: Note:General: Overweight male in NAD. Pleasant and cooperative.  HEENT: [place]/AT, no facial masses  EYES: EOMI, no icterus  CV: RRR.  CHEST: Breath sounds equal and clear. Respirations nonlabored.  ABDOMEN: Soft, obese, nontender, RUQ scar, midline scar with reducible periumbilical bulge, left midabdominal scar with reducible bulge.  NEUROLOGIC: Alert and oriented, answers questions appropriately, normal gait and station.  PSYCHIATRIC: Normal mood, affect , and behavior. General-    Assessment & Plan (Sylvain Hasten J. Dennice Tindol MD; 11/05/2015 10:32 AM)  INCISIONAL HERNIA, WITHOUT OBSTRUCTION OR GANGRENE (K43.2) Impression: He has 2 hernias, one in the periumbilical region and what at the site of the previous colostomy. He is now interested in proceeding with repair.  Plan: Laparoscopic repair of ventral incisional hernias with mesh. I have discussed the procedure, risks, and aftercare. Risks include but are not limited to bleeding, infection, wound healing problems, anesthesia, recurrence, accidental injury to intra-abdominal organs-such as intestine, liver, spleen, bladder, etc. We also discussed the rare complication of mesh rejection. All questions were  answered.  Arlicia Paquette, MD 

## 2015-11-10 DIAGNOSIS — J019 Acute sinusitis, unspecified: Secondary | ICD-10-CM | POA: Diagnosis not present

## 2015-11-10 DIAGNOSIS — E1149 Type 2 diabetes mellitus with other diabetic neurological complication: Secondary | ICD-10-CM | POA: Diagnosis not present

## 2015-11-15 NOTE — Patient Instructions (Addendum)
Nicholas [name]  11/15/2015   Your procedure is scheduled on: November 19, 2015  Report to [facility]  Entrance take [facility]  elevators to 3rd floor to  [place] at 7:00 AM.  Call this number if you have problems the morning of surgery [phone]   Remember: ONLY 1 PERSON MAY GO WITH YOU TO SHORT STAY TO GET  READY MORNING OF [place].  Do not eat food or drink liquids :After Midnight.              Bring mask and tubing from C-pap machine     Take these medicines the morning of surgery with A SIP OF WATER: Allbuterol if needed and bring, Flonase, Gabapentin, Tiotropium Bromide-Olodaterol, Umeclidinium-Vilanterol DO NOT TAKE ANY DIABETIC MEDICATIONS DAY OF YOUR SURGERY                               You may not have any metal on your body including hair pins and              piercings  Do not wear jewelry, lotions, powders or perfumes, deodorant                        Men may shave face and neck.   Do not bring valuables to the hospital. [place].  Contacts, dentures or bridgework may not be worn into surgery.  Leave suitcase in the car. After surgery it may be brought to your room.   Marland Kitchen   Special Instructions: coughing and deep breathing exercises, leg exercises              Please read over the following fact sheets you were given: _____________________________________________________________________             [facility] - Preparing for Surgery Before surgery, you can play an important role.  Because skin is not sterile, your skin needs to be as free of germs as possible.  You can reduce the number of germs on your skin by washing with CHG (chlorahexidine gluconate) soap before surgery.  CHG is an antiseptic cleaner which kills germs and bonds with the skin to continue killing germs even after washing. Please DO NOT use if you have an allergy to CHG or antibacterial soaps.  If your skin  becomes reddened/irritated stop using the CHG and inform your nurse when you arrive at Short Stay. Do not shave (including legs and underarms) for at least 48 hours prior to the first CHG shower.  You may shave your face/neck. Please follow these instructions carefully:  1.  Shower with CHG Soap the night before surgery and the  morning of Surgery.  2.  If you choose to wash your hair, wash your hair first as usual with your  normal  shampoo.  3.  After you shampoo, rinse your hair and body thoroughly to remove the  shampoo.                           4.  Use CHG as you would any other liquid soap.  You can apply chg directly  to the skin and wash  Gently with a scrungie or clean washcloth.  5.  Apply the CHG Soap to your body ONLY FROM THE NECK DOWN.   Do not use on face/ open                           Wound or open sores. Avoid contact with eyes, ears mouth and genitals (private parts).                       Wash face,  Genitals (private parts) with your normal soap.             6.  Wash thoroughly, paying special attention to the area where your surgery  will be performed.  7.  Thoroughly rinse your body with warm water from the neck down.  8.  DO NOT shower/wash with your normal soap after using and rinsing off  the CHG Soap.                9.  Pat yourself dry with a clean towel.            10.  Wear clean pajamas.            11.  Place clean sheets on your bed the night of your first shower and do not  sleep with pets. Day of Surgery : Do not apply any lotions/deodorants the morning of surgery.  Please wear clean clothes to the hospital/surgery center.  FAILURE TO FOLLOW THESE INSTRUCTIONS MAY RESULT IN THE CANCELLATION OF YOUR SURGERY PATIENT SIGNATURE_________________________________  NURSE SIGNATURE__________________________________  ________________________________________________________________________

## 2015-11-16 ENCOUNTER — Encounter (HOSPITAL_COMMUNITY): Payer: Self-pay

## 2015-11-16 ENCOUNTER — Other Ambulatory Visit: Payer: Self-pay | Admitting: Family Medicine

## 2015-11-16 ENCOUNTER — Encounter (HOSPITAL_COMMUNITY)
Admission: RE | Admit: 2015-11-16 | Discharge: 2015-11-16 | Disposition: A | Discharge status: Home / Self Care | Payer: Medicare Other | Source: Ambulatory Visit | Attending: General Surgery | Admitting: General Surgery

## 2015-11-16 DIAGNOSIS — Z01818 Encounter for other preprocedural examination: Secondary | ICD-10-CM

## 2015-11-16 DIAGNOSIS — Z79899 Other long term (current) drug therapy: Secondary | ICD-10-CM

## 2015-11-16 DIAGNOSIS — E1149 Type 2 diabetes mellitus with other diabetic neurological complication: Secondary | ICD-10-CM | POA: Diagnosis not present

## 2015-11-16 DIAGNOSIS — K439 Ventral hernia without obstruction or gangrene: Secondary | ICD-10-CM

## 2015-11-16 DIAGNOSIS — Z01812 Encounter for preprocedural laboratory examination: Secondary | ICD-10-CM | POA: Insufficient documentation

## 2015-11-16 DIAGNOSIS — R9431 Abnormal electrocardiogram [ECG] [EKG]: Secondary | ICD-10-CM

## 2015-11-16 DIAGNOSIS — I4510 Unspecified right bundle-branch block: Secondary | ICD-10-CM | POA: Insufficient documentation

## 2015-11-16 DIAGNOSIS — J449 Chronic obstructive pulmonary disease, unspecified: Secondary | ICD-10-CM | POA: Diagnosis not present

## 2015-11-16 DIAGNOSIS — K432 Incisional hernia without obstruction or gangrene: Secondary | ICD-10-CM | POA: Diagnosis not present

## 2015-11-16 HISTORY — DX: Chronic obstructive pulmonary disease, unspecified: J44.9

## 2015-11-16 HISTORY — DX: Reserved for inherently not codable concepts without codable children: IMO0001

## 2015-11-16 HISTORY — DX: Pneumonia, unspecified organism: J18.9

## 2015-11-16 LAB — COMPREHENSIVE METABOLIC PANEL
ALT: 22 U/L (ref 17–63)
AST: 23 U/L (ref 15–41)
Albumin: 3.9 g/dL (ref 3.5–5.0)
Alkaline Phosphatase: 56 U/L (ref 38–126)
Anion gap: 10 (ref 5–15)
BILIRUBIN TOTAL: 0.7 mg/dL (ref 0.3–1.2)
BUN: 20 mg/dL (ref 6–20)
CO2: 26 mmol/L (ref 22–32)
Calcium: 9.2 mg/dL (ref 8.9–10.3)
Chloride: 102 mmol/L (ref 101–111)
Creatinine, Ser: 1.13 mg/dL (ref 0.61–1.24)
GLUCOSE: 178 mg/dL — AB (ref 65–99)
Potassium: 4 mmol/L (ref 3.5–5.1)
Sodium: 138 mmol/L (ref 135–145)
Total Protein: 7.2 g/dL (ref 6.5–8.1)

## 2015-11-16 LAB — CBC WITH DIFFERENTIAL/PLATELET
BASOS ABS: 0 10*3/uL (ref 0.0–0.1)
Basophils Relative: 0 %
EOS PCT: 1 %
Eosinophils Absolute: 0.1 10*3/uL (ref 0.0–0.7)
HEMATOCRIT: 39.9 % (ref 39.0–52.0)
Hemoglobin: 13.2 g/dL (ref 13.0–17.0)
LYMPHS ABS: 0.8 10*3/uL (ref 0.7–4.0)
LYMPHS PCT: 12 %
MCH: 30.2 pg (ref 26.0–34.0)
MCHC: 33.1 g/dL (ref 30.0–36.0)
MCV: 91.3 fL (ref 78.0–100.0)
MONO ABS: 0.7 10*3/uL (ref 0.1–1.0)
Monocytes Relative: 11 %
NEUTROS ABS: 5 10*3/uL (ref 1.7–7.7)
Neutrophils Relative %: 76 %
PLATELETS: 157 10*3/uL (ref 150–400)
RBC: 4.37 MIL/uL (ref 4.22–5.81)
RDW: 13.6 % (ref 11.5–15.5)
WBC: 6.6 10*3/uL (ref 4.0–10.5)

## 2015-11-16 NOTE — Telephone Encounter (Signed)
Rx called in to requested pharmacy 

## 2015-11-16 NOTE — Progress Notes (Signed)
09-22-15 - LOV - Dr. Deborra Medina (fam.med.) - EPIC 09-07-15 - HgA1C (6.1) - EPIC 09-07-15 - LOV - Dr. Cruzita Lederer (endoc) - EPIC 09-02-15 - LOV Dr. Sherilyn Cooter [email or web address].med) - Care Everywhere  09-17-14 - 2V CXR - EPIC  12-04-12 - Echo - EPIC

## 2015-11-16 NOTE — Telephone Encounter (Signed)
Last f/u 05/2015 

## 2015-11-17 LAB — HEMOGLOBIN A1C
HEMOGLOBIN A1C: 6.1 % — AB (ref 4.8–5.6)
Mean Plasma Glucose: 128 mg/dL

## 2015-11-17 NOTE — Progress Notes (Signed)
Dr Al Corpus / anesthesia reviewed EKGs/epic 11/16/2015 and 09/20/2012. No orders given. Anesthesia to see pt day of surgery.

## 2015-11-17 NOTE — Progress Notes (Signed)
A1C results in epic per PAT visit 11/16/2015 sent to Dr Zella Richer

## 2015-11-19 ENCOUNTER — Inpatient Hospital Stay (HOSPITAL_COMMUNITY)
Admission: RE | Admit: 2015-11-19 | Discharge: 2015-11-24 | DRG: 355 | Disposition: A | Discharge status: Home / Self Care | Payer: Medicare Other | Source: Ambulatory Visit | Attending: General Surgery | Admitting: General Surgery

## 2015-11-19 ENCOUNTER — Ambulatory Visit (HOSPITAL_COMMUNITY): Payer: Medicare Other | Admitting: Anesthesiology

## 2015-11-19 ENCOUNTER — Encounter (HOSPITAL_COMMUNITY)
Admission: RE | Disposition: A | Discharge status: Home / Self Care | Payer: Self-pay | Source: Ambulatory Visit | Attending: General Surgery

## 2015-11-19 ENCOUNTER — Encounter (HOSPITAL_COMMUNITY): Payer: Self-pay | Admitting: *Deleted

## 2015-11-19 DIAGNOSIS — K432 Incisional hernia without obstruction or gangrene: Secondary | ICD-10-CM | POA: Diagnosis not present

## 2015-11-19 DIAGNOSIS — E1149 Type 2 diabetes mellitus with other diabetic neurological complication: Secondary | ICD-10-CM | POA: Diagnosis present

## 2015-11-19 DIAGNOSIS — G4733 Obstructive sleep apnea (adult) (pediatric): Secondary | ICD-10-CM | POA: Diagnosis present

## 2015-11-19 DIAGNOSIS — M2140 Flat foot [pes planus] (acquired), unspecified foot: Secondary | ICD-10-CM | POA: Diagnosis present

## 2015-11-19 DIAGNOSIS — M2020 Hallux rigidus, unspecified foot: Secondary | ICD-10-CM | POA: Diagnosis present

## 2015-11-19 DIAGNOSIS — Z7984 Long term (current) use of oral hypoglycemic drugs: Secondary | ICD-10-CM | POA: Diagnosis not present

## 2015-11-19 DIAGNOSIS — G4700 Insomnia, unspecified: Secondary | ICD-10-CM | POA: Diagnosis present

## 2015-11-19 DIAGNOSIS — K439 Ventral hernia without obstruction or gangrene: Secondary | ICD-10-CM | POA: Diagnosis not present

## 2015-11-19 DIAGNOSIS — E785 Hyperlipidemia, unspecified: Secondary | ICD-10-CM | POA: Diagnosis present

## 2015-11-19 DIAGNOSIS — E1140 Type 2 diabetes mellitus with diabetic neuropathy, unspecified: Secondary | ICD-10-CM | POA: Diagnosis not present

## 2015-11-19 DIAGNOSIS — J439 Emphysema, unspecified: Secondary | ICD-10-CM | POA: Diagnosis present

## 2015-11-19 DIAGNOSIS — Z01812 Encounter for preprocedural laboratory examination: Secondary | ICD-10-CM

## 2015-11-19 DIAGNOSIS — J449 Chronic obstructive pulmonary disease, unspecified: Secondary | ICD-10-CM | POA: Diagnosis present

## 2015-11-19 DIAGNOSIS — I10 Essential (primary) hypertension: Secondary | ICD-10-CM | POA: Diagnosis not present

## 2015-11-19 DIAGNOSIS — M5430 Sciatica, unspecified side: Secondary | ICD-10-CM | POA: Diagnosis present

## 2015-11-19 HISTORY — DX: Type 2 diabetes mellitus with foot ulcer: E11.621

## 2015-11-19 HISTORY — PX: INSERTION OF MESH: SHX5868

## 2015-11-19 HISTORY — PX: VENTRAL HERNIA REPAIR: SHX424

## 2015-11-19 HISTORY — DX: Diverticulitis of large intestine with perforation and abscess without bleeding: K57.20

## 2015-11-19 HISTORY — DX: Ventral hernia without obstruction or gangrene: K43.9

## 2015-11-19 HISTORY — DX: Non-pressure chronic ulcer of other part of unspecified foot with unspecified severity: L97.509

## 2015-11-19 HISTORY — DX: Colostomy status: Z93.3

## 2015-11-19 LAB — GLUCOSE, CAPILLARY
GLUCOSE-CAPILLARY: 170 mg/dL — AB (ref 65–99)
Glucose-Capillary: 129 mg/dL — ABNORMAL HIGH (ref 65–99)
Glucose-Capillary: 135 mg/dL — ABNORMAL HIGH (ref 65–99)
Glucose-Capillary: 161 mg/dL — ABNORMAL HIGH (ref 65–99)

## 2015-11-19 SURGERY — REPAIR, HERNIA, VENTRAL, LAPAROSCOPIC
Anesthesia: General | Site: Abdomen

## 2015-11-19 MED ORDER — TIOTROPIUM BROMIDE-OLODATEROL 2.5-2.5 MCG/ACT IN AERS: 2.0000 | INHALATION_SPRAY | Freq: Every day | RESPIRATORY_TRACT | Status: DC

## 2015-11-19 MED ORDER — HYDROMORPHONE HCL 1 MG/ML IJ SOLN
0.2500 mg | INTRAMUSCULAR | Status: DC | PRN
  Administered 2015-11-19 (×4): 0.5 mg via INTRAVENOUS

## 2015-11-19 MED ORDER — ALBUTEROL SULFATE (2.5 MG/3ML) 0.083% IN NEBU: 3.0000 mL | INHALATION_SOLUTION | Freq: Four times a day (QID) | RESPIRATORY_TRACT | Status: DC | PRN

## 2015-11-19 MED ORDER — MIDAZOLAM HCL 2 MG/2ML IJ SOLN
INTRAMUSCULAR | Status: AC
  Filled 2015-11-19: qty 2

## 2015-11-19 MED ORDER — ARFORMOTEROL TARTRATE 15 MCG/2ML IN NEBU
15.0000 ug | INHALATION_SOLUTION | Freq: Two times a day (BID) | RESPIRATORY_TRACT | Status: DC
  Administered 2015-11-19 – 2015-11-24 (×10): 15 ug via RESPIRATORY_TRACT
  Filled 2015-11-19 (×12): qty 2

## 2015-11-19 MED ORDER — LACTATED RINGERS IR SOLN
Status: DC | PRN
  Administered 2015-11-19: 1000 mL

## 2015-11-19 MED ORDER — UMECLIDINIUM-VILANTEROL 62.5-25 MCG/INH IN AEPB: 1.0000 | INHALATION_SPRAY | Freq: Every day | RESPIRATORY_TRACT | Status: DC

## 2015-11-19 MED ORDER — LIDOCAINE HCL (CARDIAC) 20 MG/ML IV SOLN
INTRAVENOUS | Status: AC
  Filled 2015-11-19: qty 5

## 2015-11-19 MED ORDER — FENTANYL CITRATE (PF) 100 MCG/2ML IJ SOLN
INTRAMUSCULAR | Status: AC
  Filled 2015-11-19: qty 2

## 2015-11-19 MED ORDER — METHOCARBAMOL 500 MG PO TABS
500.0000 mg | ORAL_TABLET | Freq: Four times a day (QID) | ORAL | Status: DC | PRN
  Administered 2015-11-20 (×2): 500 mg via ORAL
  Filled 2015-11-19 (×2): qty 1

## 2015-11-19 MED ORDER — ONDANSETRON HCL 4 MG/2ML IJ SOLN
4.0000 mg | INTRAMUSCULAR | Status: DC | PRN
  Administered 2015-11-19: 4 mg via INTRAVENOUS
  Filled 2015-11-19: qty 2

## 2015-11-19 MED ORDER — TAMSULOSIN HCL 0.4 MG PO CAPS
0.8000 mg | ORAL_CAPSULE | Freq: Every day | ORAL | Status: DC
  Administered 2015-11-19 – 2015-11-20 (×2): 0.8 mg via ORAL
  Filled 2015-11-19 (×4): qty 2

## 2015-11-19 MED ORDER — HYDROCODONE-ACETAMINOPHEN 7.5-325 MG PO TABS: 1.0000 | ORAL_TABLET | Freq: Once | ORAL | Status: DC | PRN

## 2015-11-19 MED ORDER — ONDANSETRON HCL 4 MG/2ML IJ SOLN
INTRAMUSCULAR | Status: AC
  Filled 2015-11-19: qty 2

## 2015-11-19 MED ORDER — GLYCOPYRROLATE 0.2 MG/ML IJ SOLN
INTRAMUSCULAR | Status: DC | PRN
  Administered 2015-11-19: .6 mg via INTRAVENOUS

## 2015-11-19 MED ORDER — LACTATED RINGERS IV SOLN
INTRAVENOUS | Status: DC | PRN
  Administered 2015-11-19 (×2): via INTRAVENOUS

## 2015-11-19 MED ORDER — OXYCODONE HCL 5 MG PO TABS
5.0000 mg | ORAL_TABLET | ORAL | Status: DC | PRN
  Administered 2015-11-20: 10 mg via ORAL
  Administered 2015-11-20 (×2): 5 mg via ORAL
  Administered 2015-11-20: 10 mg via ORAL
  Administered 2015-11-21: 5 mg via ORAL
  Administered 2015-11-21: 10 mg via ORAL
  Filled 2015-11-19 (×2): qty 2
  Filled 2015-11-19 (×2): qty 1
  Filled 2015-11-19: qty 2
  Filled 2015-11-19: qty 1

## 2015-11-19 MED ORDER — FENTANYL CITRATE (PF) 250 MCG/5ML IJ SOLN
INTRAMUSCULAR | Status: AC
  Filled 2015-11-19: qty 5

## 2015-11-19 MED ORDER — CEFAZOLIN SODIUM-DEXTROSE 2-3 GM-% IV SOLR
2.0000 g | Freq: Three times a day (TID) | INTRAVENOUS | Status: AC
  Filled 2015-11-19 (×2): qty 50

## 2015-11-19 MED ORDER — NEOSTIGMINE METHYLSULFATE 10 MG/10ML IV SOLN
INTRAVENOUS | Status: DC | PRN
  Administered 2015-11-19: 4 mg via INTRAVENOUS

## 2015-11-19 MED ORDER — NEOSTIGMINE METHYLSULFATE 10 MG/10ML IV SOLN
INTRAVENOUS | Status: AC
  Filled 2015-11-19: qty 1

## 2015-11-19 MED ORDER — HEPARIN SODIUM (PORCINE) 5000 UNIT/ML IJ SOLN
5000.0000 [IU] | Freq: Three times a day (TID) | INTRAMUSCULAR | Status: DC
  Administered 2015-11-20 – 2015-11-24 (×13): 5000 [IU] via SUBCUTANEOUS
  Filled 2015-11-19 (×17): qty 1

## 2015-11-19 MED ORDER — LIDOCAINE HCL (CARDIAC) 20 MG/ML IV SOLN
INTRAVENOUS | Status: DC | PRN
  Administered 2015-11-19: 50 mg via INTRAVENOUS

## 2015-11-19 MED ORDER — ONDANSETRON HCL 4 MG/2ML IJ SOLN
INTRAMUSCULAR | Status: DC | PRN
  Administered 2015-11-19: 4 mg via INTRAVENOUS

## 2015-11-19 MED ORDER — MIDAZOLAM HCL 5 MG/5ML IJ SOLN
INTRAMUSCULAR | Status: DC | PRN
  Administered 2015-11-19: 2 mg via INTRAVENOUS

## 2015-11-19 MED ORDER — 0.9 % SODIUM CHLORIDE (POUR BTL) OPTIME
TOPICAL | Status: DC | PRN
  Administered 2015-11-19: 1000 mL

## 2015-11-19 MED ORDER — UMECLIDINIUM BROMIDE 62.5 MCG/INH IN AEPB
1.0000 | INHALATION_SPRAY | Freq: Every day | RESPIRATORY_TRACT | Status: DC
  Administered 2015-11-20 – 2015-11-24 (×3): 1 via RESPIRATORY_TRACT
  Filled 2015-11-19 (×4): qty 1

## 2015-11-19 MED ORDER — MORPHINE SULFATE (PF) 2 MG/ML IV SOLN
2.0000 mg | INTRAVENOUS | Status: DC | PRN
  Administered 2015-11-19: 4 mg via INTRAVENOUS
  Administered 2015-11-19: 2 mg via INTRAVENOUS
  Administered 2015-11-19: 6 mg via INTRAVENOUS
  Administered 2015-11-20 (×2): 4 mg via INTRAVENOUS
  Administered 2015-11-20: 6 mg via INTRAVENOUS
  Administered 2015-11-20: 4 mg via INTRAVENOUS
  Administered 2015-11-21 (×3): 2 mg via INTRAVENOUS
  Filled 2015-11-19: qty 1
  Filled 2015-11-19 (×3): qty 2
  Filled 2015-11-19: qty 1
  Filled 2015-11-19 (×2): qty 3
  Filled 2015-11-19 (×2): qty 1
  Filled 2015-11-19: qty 2

## 2015-11-19 MED ORDER — CEFAZOLIN SODIUM-DEXTROSE 2-3 GM-% IV SOLR
2.0000 g | INTRAVENOUS | Status: AC
  Administered 2015-11-19: 2 g via INTRAVENOUS

## 2015-11-19 MED ORDER — ROCURONIUM BROMIDE 100 MG/10ML IV SOLN
INTRAVENOUS | Status: DC | PRN
  Administered 2015-11-19: 10 mg via INTRAVENOUS
  Administered 2015-11-19: 20 mg via INTRAVENOUS
  Administered 2015-11-19: 50 mg via INTRAVENOUS
  Administered 2015-11-19: 10 mg via INTRAVENOUS

## 2015-11-19 MED ORDER — FENTANYL CITRATE (PF) 100 MCG/2ML IJ SOLN
INTRAMUSCULAR | Status: DC | PRN
Start: 2015-11-19 — End: 2015-11-19
  Administered 2015-11-19 (×5): 50 ug via INTRAVENOUS
  Administered 2015-11-19: 100 ug via INTRAVENOUS

## 2015-11-19 MED ORDER — GABAPENTIN 400 MG PO CAPS
400.0000 mg | ORAL_CAPSULE | Freq: Three times a day (TID) | ORAL | Status: DC
Start: 2015-11-19 — End: 2015-11-24
  Administered 2015-11-19 – 2015-11-24 (×15): 400 mg via ORAL
  Filled 2015-11-19 (×18): qty 1

## 2015-11-19 MED ORDER — BUPIVACAINE-EPINEPHRINE (PF) 0.25% -1:200000 IJ SOLN
INTRAMUSCULAR | Status: AC
  Filled 2015-11-19: qty 30

## 2015-11-19 MED ORDER — PROPOFOL 10 MG/ML IV BOLUS
INTRAVENOUS | Status: AC
  Filled 2015-11-19: qty 20

## 2015-11-19 MED ORDER — GLYCOPYRROLATE 0.2 MG/ML IJ SOLN
INTRAMUSCULAR | Status: AC
  Filled 2015-11-19: qty 3

## 2015-11-19 MED ORDER — PROMETHAZINE HCL 25 MG/ML IJ SOLN: 6.2500 mg | INTRAMUSCULAR | Status: DC | PRN

## 2015-11-19 MED ORDER — HYDROMORPHONE HCL 1 MG/ML IJ SOLN
INTRAMUSCULAR | Status: AC
  Filled 2015-11-19: qty 1

## 2015-11-19 MED ORDER — ONDANSETRON 4 MG PO TBDP: 4.0000 mg | ORAL_TABLET | Freq: Four times a day (QID) | ORAL | Status: DC | PRN

## 2015-11-19 MED ORDER — INSULIN ASPART 100 UNIT/ML ~~LOC~~ SOLN
0.0000 [IU] | Freq: Three times a day (TID) | SUBCUTANEOUS | Status: DC
  Administered 2015-11-19: 3 [IU] via SUBCUTANEOUS
  Administered 2015-11-20 (×2): 2 [IU] via SUBCUTANEOUS
  Administered 2015-11-20 – 2015-11-21 (×3): 3 [IU] via SUBCUTANEOUS

## 2015-11-19 MED ORDER — LACTATED RINGERS IV SOLN: INTRAVENOUS | Status: DC

## 2015-11-19 MED ORDER — FLUTICASONE PROPIONATE 50 MCG/ACT NA SUSP
2.0000 | Freq: Every day | NASAL | Status: DC
  Administered 2015-11-19 – 2015-11-24 (×6): 2 via NASAL
  Filled 2015-11-19: qty 16

## 2015-11-19 MED ORDER — ROCURONIUM BROMIDE 100 MG/10ML IV SOLN
INTRAVENOUS | Status: AC
  Filled 2015-11-19: qty 1

## 2015-11-19 MED ORDER — CEFAZOLIN SODIUM-DEXTROSE 2-3 GM-% IV SOLR
INTRAVENOUS | Status: AC
  Filled 2015-11-19: qty 50

## 2015-11-19 MED ORDER — BUPIVACAINE-EPINEPHRINE 0.25% -1:200000 IJ SOLN
INTRAMUSCULAR | Status: DC | PRN
  Administered 2015-11-19: 25 mL

## 2015-11-19 MED ORDER — KCL IN DEXTROSE-NACL 20-5-0.9 MEQ/L-%-% IV SOLN
INTRAVENOUS | Status: DC
  Administered 2015-11-19 – 2015-11-20 (×2): via INTRAVENOUS
  Administered 2015-11-21: 1000 mL via INTRAVENOUS
  Filled 2015-11-19 (×5): qty 1000

## 2015-11-19 MED ORDER — PROPOFOL 10 MG/ML IV BOLUS
INTRAVENOUS | Status: DC | PRN
  Administered 2015-11-19: 180 mg via INTRAVENOUS

## 2015-11-19 SURGICAL SUPPLY — 43 items
APL SKNCLS STERI-STRIP NONHPOA (GAUZE/BANDAGES/DRESSINGS) ×2
BANDAGE ADH SHEER 1  50/CT (GAUZE/BANDAGES/DRESSINGS) ×16 IMPLANT
BENZOIN TINCTURE PRP APPL 2/3 (GAUZE/BANDAGES/DRESSINGS) ×4 IMPLANT
BINDER ABDOMINAL 12 ML 46-62 (SOFTGOODS) ×2 IMPLANT
BNDG PLASTER X FAST 4X5 WHT LF (CAST SUPPLIES) ×2 IMPLANT
BNDG PLSTR 5X4 XFST ST WHT LF (CAST SUPPLIES) ×2
CABLE HIGH FREQUENCY MONO STRZ (ELECTRODE) ×2 IMPLANT
CHLORAPREP W/TINT 26ML (MISCELLANEOUS) ×4 IMPLANT
CLOSURE WOUND 1/2 X4 (GAUZE/BANDAGES/DRESSINGS) ×1
COVER SURGICAL LIGHT HANDLE (MISCELLANEOUS) ×4 IMPLANT
DECANTER SPIKE VIAL GLASS SM (MISCELLANEOUS) ×2 IMPLANT
DEVICE TROCAR PUNCTURE CLOSURE (ENDOMECHANICALS) ×4 IMPLANT
DISSECTOR BLUNT TIP ENDO 5MM (MISCELLANEOUS) ×2 IMPLANT
DRAPE INCISE IOBAN 66X45 STRL (DRAPES) ×4 IMPLANT
DRAPE LAPAROSCOPIC ABDOMINAL (DRAPES) ×4 IMPLANT
DRSG TEGADERM 2-3/8X2-3/4 SM (GAUZE/BANDAGES/DRESSINGS) ×12 IMPLANT
ELECT REM PT RETURN 9FT ADLT (ELECTROSURGICAL) ×4
ELECTRODE REM PT RTRN 9FT ADLT (ELECTROSURGICAL) ×2 IMPLANT
GLOVE ECLIPSE 8.0 STRL XLNG CF (GLOVE) ×4 IMPLANT
GLOVE INDICATOR 8.0 STRL GRN (GLOVE) ×8 IMPLANT
GOWN STRL REUS W/TWL XL LVL3 (GOWN DISPOSABLE) ×8 IMPLANT
KIT BASIN OR (CUSTOM PROCEDURE TRAY) ×4 IMPLANT
MARKER SKIN DUAL TIP RULER LAB (MISCELLANEOUS) ×4 IMPLANT
MESH VENTRALIGHT ST 8X10 (Mesh General) ×2 IMPLANT
NDL SPNL 22GX3.5 QUINCKE BK (NEEDLE) ×2 IMPLANT
NEEDLE SPNL 22GX3.5 QUINCKE BK (NEEDLE) ×4 IMPLANT
SCISSORS LAP 5X35 DISP (ENDOMECHANICALS) ×4 IMPLANT
SET IRRIG TUBING LAPAROSCOPIC (IRRIGATION / IRRIGATOR) ×2 IMPLANT
SHEARS HARMONIC ACE PLUS 36CM (ENDOMECHANICALS) ×2 IMPLANT
SLEEVE XCEL OPT CAN 5 100 (ENDOMECHANICALS) ×8 IMPLANT
SOLUTION ANTI FOG 6CC (MISCELLANEOUS) ×2 IMPLANT
STRIP CLOSURE SKIN 1/2X4 (GAUZE/BANDAGES/DRESSINGS) ×3 IMPLANT
SUT MNCRL AB 4-0 PS2 18 (SUTURE) ×6 IMPLANT
SUT NOVA NAB DX-16 0-1 5-0 T12 (SUTURE) ×4 IMPLANT
SUT PROLENE 0 CT 1 CR/8 (SUTURE) IMPLANT
TACKER 5MM HERNIA 3.5CML NAB (ENDOMECHANICALS) ×4 IMPLANT
TOWEL OR 17X26 10 PK STRL BLUE (TOWEL DISPOSABLE) ×4 IMPLANT
TRAY FOLEY W/METER SILVER 16FR (SET/KITS/TRAYS/PACK) ×4 IMPLANT
TRAY LAPAROSCOPIC (CUSTOM PROCEDURE TRAY) ×4 IMPLANT
TROCAR BLADELESS OPT 5 100 (ENDOMECHANICALS) ×4 IMPLANT
TROCAR XCEL BLUNT TIP 100MML (ENDOMECHANICALS) IMPLANT
TROCAR XCEL NON-BLD 11X100MML (ENDOMECHANICALS) ×2 IMPLANT
TUBING INSUFFLATION 10FT LAP (TUBING) ×4 IMPLANT

## 2015-11-19 NOTE — Interval H&P Note (Signed)
History and Physical Interval Note:  11/19/2015 8:44 AM  Nicholas [name]  has presented today for surgery, with the diagnosis of Ventral hernia  The various methods of treatment have been discussed with the patient and family. After consideration of risks, benefits and other options for treatment, the patient has consented to  Procedure(s): Nicholas [name] (N/A) INSERTION OF MESH (N/A) as a surgical intervention .  The patient's history has been reviewed, patient examined, no change in status, stable for surgery.  I have reviewed the patient's chart and labs.  Questions were answered to the patient's satisfaction.     Karsten Howry Lenna Sciara

## 2015-11-19 NOTE — Transfer of Care (Signed)
Immediate Anesthesia Transfer of Care Note  Patient: Nicholas [name]  Procedure(s) Performed: Procedure(s): LAPAROSCOPIC LYSIS OF ADHSIONS, VENTRAL HERNIA REPAIR WITH MESH (N/A) INSERTION OF MESH (N/A)  Patient Location: PACU  Anesthesia Type:General  Level of Consciousness:  sedated, patient cooperative and responds to stimulation  Airway & Oxygen Therapy:Patient Spontanous Breathing and Patient connected to face mask oxgen  Post-op Assessment:  Report given to PACU RN and Post -op Vital signs reviewed and stable  Post vital signs:  Reviewed and stable  Last Vitals:  Filed Vitals:   11/19/15 0650  BP: 137/51  Pulse: 71  Temp: 36.7 C  Resp: 16    Complications: No apparent anesthesia complications

## 2015-11-19 NOTE — Progress Notes (Signed)
Placed patient on CPAP for the night via auto-mode with minimum pressure set at 6cm and maximum pressure set at 20cm. Oxygen set at 2lpm  

## 2015-11-19 NOTE — Op Note (Signed)
Operative Note  Nicholas [name] male 75 y.o. 11/19/2015  PREOPERATIVE DX:  Ventral incisional hernias  POSTOPERATIVE DX:  Same  PROCEDURE:   Laparoscopic repair of ventral incisional hernias with mesh( 20 cm x 25 cm Ventralight mesh)         Surgeon: Odis Hollingshead   Assistants: Verita Lamb, MD  Anesthesia: General endotracheal anesthesia  Indications:   This is a 75 year old male who had an emergency Hartmann procedure in the past for perforated sigmoid diverticulitis. He then had colostomy reversal. He is developed an incisional hernia in the periumbilical area from the midline incision and one at the previous colostomy site. He now presents for elective repair.    Procedure Detail:  He was brought to the operating room and placed supine on the operating table and a general anesthetic was given. A Foley catheter and oral gastric tube were inserted. The hair on the abdominal wall was clipped. The abdominal wall was widely sterilely prepped and draped. A timeout was performed.  He was placed in slight reverse Trendelenburg position. A 5 millimeter left subcostal incision was made. Using a 5 mm Optiview trocar and laparoscope, access was gained into the peritoneal cavity and a pneumoperitoneum was created by placed and CO2 gas. I visualized the area underneath the trocar and there is no evidence of bleeding or organ injury. Omental adhesions were noted to the abdominal wall lower midline area and right lower quadrant area as well as by the previous colostomy site. A 5 mm trocar was placed in the right mid abdomen. A 5 mm trocar was placed in the right lower quadrant. A 5 mm trocar was placed in the right upper quadrant and later upsized to an 11 mm trocar.  Using blunt dissection and Harmonic scalpel, I spent 30 minutes lysing adhesions between the abdominal wall and omentum. I then reduced omentum out of the periumbilical hernias and the previous left-sided colostomy site hernia. There were  3 small hernias in the periumbilical area and 2 at the previous colostomy site. Using a spinal needle, I marked the edges of the hernias that measured 4 cm away from all of these. I then brought a piece of Ventralite mesh onto the field.  The mesh was 20 cm x 25 cm in size and would allow for adequate coverage with 4-5 cm overlap in all directions. The mesh was partially trimmed. Around the periphery of the mesh, 8 fixation sutures of #1 Novofil were placed. The mesh was then hydrated, rolled up, and placed into the abdominal cavity through the 11 mm trocar. The mesh was deployed so the non-adherent barrier side was facing the viscera. I then made 8 small incisions in the abdominal wall pulled up the anchoring sutures across the fascial bridge. These were all tied down anchoring the mesh (rough side up) to the anterior abdominal wall. Using a spiral tacker, I then further anchored the mesh to the abdominal wall with a outer and inner application of the tacks. This provided for good coverage with good overlap of the hernia sites.  I then performed a 4 quadrant and central inspection. There was no evidence of bleeding or organ injury. The CO2 gas was released and I watched the mesh approximate the viscera. The trocars were then removed.  All skin incisions were then closed with 4-0 Monocryl subcuticular stitches. Steri-Strips and sterile dressings were applied. An abdominal binder was applied.  He tolerated the procedure well without any apparent complications and was taken to the recovery  room in satisfactory condition.  Estimated Blood Loss:  150 ml               Complications:  * No complications entered in OR log *         Disposition: PACU - hemodynamically stable.         Condition: stable

## 2015-11-19 NOTE — H&P (View-Only) (Signed)
Audley Hose 11/05/2015 9:51 AM Location: [facility] Patient #: [id number] DOB: 29-Oct-1940 Married / Language: Cleophus Molt / Race: White Male  History of Present Illness Nicholas Hollingshead MD; 11/05/2015 10:30 AM) The patient is a 75 year old male.   Note:He is an established patient with known incisional hernias and a previous left abdominal colostomy site and in the periumbilical region. He underwent a Hartmann procedure in March 2013 for perforated sigmoid diverticulitis. He subsequently had colostomy closure December 2013. He was seen approximately 2 years ago with an incisional hernia at the previous colostomy site and decided not to have surgery at that time. He returns now because her hernia is getting larger and is intermittently uncomfortable. No obstruction symptoms. He continues to work full-time.  His past medical history is notable for diabetes mellitus with neuropathy. He also has COPD and obstructive sleep apnea. He has hyperlipidemia. He also has BPH.  Other Problems Marjean Donna, [place]; 11/05/2015 9:51 AM) Diabetes Mellitus  Diagnostic Studies History Marjean Donna, CMA; 11/05/2015 9:51 AM) Colonoscopy 1-5 years ago  Allergies Davy Pique Bynum, CMA; 11/05/2015 9:52 AM) Sudafed *NASAL AGENTS - SYSTEMIC AND TOPICAL*  Medication History (Sonya Bynum, CMA; 11/05/2015 9:52 AM) Ventolin HFA (108 (90 Base)MCG/ACT Aerosol Soln, Inhalation) Active. Fluticasone Propionate (50MCG/ACT Suspension, Nasal) Active. Gabapentin (400MG Capsule, Oral) Active. GlipiZIDE ER (5MG Tablet ER 24HR, Oral) Active. MetFORMIN HCl (500MG Tablet, Oral) Active. Tamsulosin HCl (0.4MG Capsule, Oral) Active. Doxycycline Hyclate (100MG Tablet, Oral) Active. Blood Glucose Monitor System (w/Device Kit,) Active. Naproxen Sodium (220MG Tablet, Oral) Active. Aspirin (81MG Tablet DR, Oral) Active. TraMADol HCl (50MG Tablet, Oral) Active. Anoro Ellipta (62.5-25MCG/INH Aero Pow Br Act,  Inhalation) Active. Medications Reconciled  Social History Marjean Donna, CMA; 11/05/2015 9:51 AM) Alcohol use Occasional alcohol use. Caffeine use Coffee. Tobacco use Former smoker.     Review of Systems ([place]; 11/05/2015 9:51 AM) Skin Present- New Lesions. Not Present- Change in Wart/Mole, Dryness, Hives, Jaundice, Non-Healing Wounds, Rash and Ulcer.  Vitals (Sonya Bynum CMA; 11/05/2015 9:52 AM) 11/05/2015 9:52 AM Weight: 252 lb Height: 72in Body Surface Area: 2.35 m Body Mass Index: 34.18 kg/m  Temp.: 53F(Temporal)  Pulse: 78 (Regular)  BP: 124/78 (Sitting, Left Arm, Standard)      Physical Exam Nicholas Hollingshead MD; 11/05/2015 10:31 AM)  The physical exam findings are as follows: Note:General: Overweight male in NAD. Pleasant and cooperative.  HEENT: [place]/AT, no facial masses  EYES: EOMI, no icterus  CV: RRR.  CHEST: Breath sounds equal and clear. Respirations nonlabored.  ABDOMEN: Soft, obese, nontender, RUQ scar, midline scar with reducible periumbilical bulge, left midabdominal scar with reducible bulge.  NEUROLOGIC: Alert and oriented, answers questions appropriately, normal gait and station.  PSYCHIATRIC: Normal mood, affect , and behavior. General-    Assessment & Plan Nicholas Hollingshead MD; 11/05/2015 10:32 AM)  Fatima Blank HERNIA, WITHOUT OBSTRUCTION OR GANGRENE (K43.2) Impression: He has 2 hernias, one in the periumbilical region and what at the site of the previous colostomy. He is now interested in proceeding with repair.  Plan: Laparoscopic repair of ventral incisional hernias with mesh. I have discussed the procedure, risks, and aftercare. Risks include but are not limited to bleeding, infection, wound healing problems, anesthesia, recurrence, accidental injury to intra-abdominal organs-such as intestine, liver, spleen, bladder, etc. We also discussed the rare complication of mesh rejection. All questions were  answered.  Jackolyn Confer, MD

## 2015-11-19 NOTE — Anesthesia Procedure Notes (Signed)
Procedure Name: Intubation Date/Time: 11/19/2015 9:20 AM Performed by: Anne Fu Pre-anesthesia Checklist: Patient identified, Emergency Drugs available, Suction available, Patient being monitored and Timeout performed Patient Re-evaluated:Patient Re-evaluated prior to inductionOxygen Delivery Method: Circle system utilized Preoxygenation: Pre-oxygenation with 100% oxygen Intubation Type: IV induction Ventilation: Mask ventilation without difficulty Laryngoscope Size: Mac and 4 Grade View: Grade III Tube type: Oral Tube size: 7.5 mm Number of attempts: 1 Airway Equipment and Method: Stylet Placement Confirmation: ETT inserted through vocal cords under direct vision,  positive ETCO2,  CO2 detector and breath sounds checked- equal and bilateral Secured at: 23 cm Tube secured with: Tape Dental Injury: Teeth and Oropharynx as per pre-operative assessment  Comments: Noted posterior airway with large epiglottis that covered opening.

## 2015-11-19 NOTE — Anesthesia Preprocedure Evaluation (Signed)
Anesthesia Evaluation  Patient identified by MRN, date of birth, ID band Patient awake    Reviewed: Allergy & Precautions, NPO status , Patient's Chart, lab work & pertinent test results  Airway Mallampati: III  TM Distance: >3 FB Neck ROM: Full    Dental  (+) Dental Advisory Given, Teeth Intact   Pulmonary sleep apnea and Continuous Positive Airway Pressure Ventilation , COPD, former smoker,    breath sounds clear to auscultation       Cardiovascular negative cardio ROS   Rhythm:Regular Rate:Normal     Neuro/Psych negative neurological ROS     GI/Hepatic negative GI ROS, Neg liver ROS,   Endo/Other  diabetes, Type 2, Oral Hypoglycemic Agents  Renal/GU negative Renal ROS     Musculoskeletal  (+) Arthritis ,   Abdominal   Peds  Hematology negative hematology ROS (+)   Anesthesia Other Findings   Reproductive/Obstetrics                             Lab Results  Component Value Date   WBC 6.6 11/16/2015   HGB 13.2 11/16/2015   HCT 39.9 11/16/2015   MCV 91.3 11/16/2015   PLT 157 11/16/2015   Lab Results  Component Value Date   CREATININE 1.13 11/16/2015   BUN 20 11/16/2015   NA 138 11/16/2015   K 4.0 11/16/2015   CL 102 11/16/2015   CO2 26 11/16/2015    Anesthesia Physical Anesthesia Plan  ASA: III  Anesthesia Plan: General   Post-op Pain Management:    Induction: Intravenous  Airway Management Planned: Oral ETT  Additional Equipment:   Intra-op Plan:   Post-operative Plan: Extubation in OR  Informed Consent: I have reviewed the patients History and Physical, chart, labs and discussed the procedure including the risks, benefits and alternatives for the proposed anesthesia with the patient or authorized representative who has indicated his/her understanding and acceptance.   Dental advisory given  Plan Discussed with: CRNA  Anesthesia Plan Comments:          Anesthesia Quick Evaluation

## 2015-11-19 NOTE — Anesthesia Postprocedure Evaluation (Signed)
Anesthesia Post Note  Patient: GENNIE WOJNAROWSKI  Procedure(s) Performed: Procedure(s) (LRB): LAPAROSCOPIC LYSIS OF ADHSIONS, VENTRAL HERNIA REPAIR WITH MESH (N/A) INSERTION OF MESH (N/A)  Patient location during evaluation: PACU Anesthesia Type: General Level of consciousness: awake and alert Pain management: pain level controlled Vital Signs Assessment: post-procedure vital signs reviewed and stable Respiratory status: spontaneous breathing Cardiovascular status: blood pressure returned to baseline Anesthetic complications: no    Last Vitals:  Filed Vitals:   11/19/15 1245 11/19/15 1355  BP: 153/80 146/62  Pulse: 83 65  Temp: 36.6 C 36.6 C  Resp: 12 15    Last Pain:  Filed Vitals:   11/19/15 1355  PainSc: 4                  Tiajuana Amass

## 2015-11-20 LAB — BASIC METABOLIC PANEL
Anion gap: 9 (ref 5–15)
BUN: 12 mg/dL (ref 6–20)
CALCIUM: 8.7 mg/dL — AB (ref 8.9–10.3)
CHLORIDE: 103 mmol/L (ref 101–111)
CO2: 26 mmol/L (ref 22–32)
CREATININE: 1.01 mg/dL (ref 0.61–1.24)
GFR calc non Af Amer: >60 mL/min (ref >60)
GLUCOSE: 191 mg/dL — AB (ref 65–99)
Potassium: 4.1 mmol/L (ref 3.5–5.1)
Sodium: 138 mmol/L (ref 135–145)

## 2015-11-20 LAB — GLUCOSE, CAPILLARY
GLUCOSE-CAPILLARY: 156 mg/dL — AB (ref 65–99)
GLUCOSE-CAPILLARY: 149 mg/dL — AB (ref 65–99)
Glucose-Capillary: 126 mg/dL — ABNORMAL HIGH (ref 65–99)
Glucose-Capillary: 129 mg/dL — ABNORMAL HIGH (ref 65–99)

## 2015-11-20 LAB — CBC
HCT: 39.7 % (ref 39.0–52.0)
Hemoglobin: 13.1 g/dL (ref 13.0–17.0)
MCH: 30.5 pg (ref 26.0–34.0)
MCHC: 33 g/dL (ref 30.0–36.0)
MCV: 92.5 fL (ref 78.0–100.0)
PLATELETS: 174 10*3/uL (ref 150–400)
RBC: 4.29 MIL/uL (ref 4.22–5.81)
RDW: 13.9 % (ref 11.5–15.5)
WBC: 9.1 10*3/uL (ref 4.0–10.5)

## 2015-11-20 MED ORDER — DOCUSATE SODIUM 100 MG PO CAPS
100.0000 mg | ORAL_CAPSULE | Freq: Two times a day (BID) | ORAL | Status: DC
  Administered 2015-11-20 – 2015-11-21 (×3): 100 mg via ORAL
  Filled 2015-11-20 (×4): qty 1

## 2015-11-20 NOTE — Progress Notes (Signed)
Foley discontinued at 06:15 am, patient due to void by noon today 11/20/2015. Pt ambulated 200 ft with front wheel walker. Will f/u with day shift nurse

## 2015-11-20 NOTE — Progress Notes (Signed)
1 Day Post-Op  Subjective: Having a lot of pain.  Tolerating clear liquids.  No dyspnea.  Objective: Vital signs in last 24 hours: Temp:  [97.8 F (36.6 C)-99.1 F (37.3 C)] 98 F (36.7 C) (02/10 0530) Pulse Rate:  [65-92] 92 (02/10 0530) Resp:  [8-16] 16 (02/10 0530) BP: (116-182)/(59-89) 151/68 mmHg (02/10 0530) SpO2:  [95 %-100 %] 98 % (02/10 0530) Weight:  [115.214 kg (254 lb)] 115.214 kg (254 lb) (02/09 1245) Last BM Date: 11/19/15  Intake/Output from previous day: 02/09 0701 - 02/10 0700 In: 2156.7 [I.V.:2156.7] Out: 3650 [Urine:3650] Intake/Output this shift:    PE: General- In NAD Lungs-clear Abdomen-soft, few bowel sounds, dressings dry  Lab Results:   Recent Labs  11/20/15 0431  WBC 9.1  HGB 13.1  HCT 39.7  PLT 174   BMET  Recent Labs  11/20/15 0431  NA 138  K 4.1  CL 103  CO2 26  GLUCOSE 191*  BUN 12  CREATININE 1.01  CALCIUM 8.7*   PT/INR No results for input(s): LABPROT, INR in the last 72 hours. Comprehensive Metabolic Panel:    Component Value Date/Time   NA 138 11/20/2015 0431   NA 138 11/16/2015 1330   K 4.1 11/20/2015 0431   K 4.0 11/16/2015 1330   CL 103 11/20/2015 0431   CL 102 11/16/2015 1330   CO2 26 11/20/2015 0431   CO2 26 11/16/2015 1330   BUN 12 11/20/2015 0431   BUN 20 11/16/2015 1330   CREATININE 1.01 11/20/2015 0431   CREATININE 1.13 11/16/2015 1330   CREATININE 1.04 05/24/2013 1346   CREATININE 0.98 11/18/2011 1624   GLUCOSE 191* 11/20/2015 0431   GLUCOSE 178* 11/16/2015 1330   CALCIUM 8.7* 11/20/2015 0431   CALCIUM 9.2 11/16/2015 1330   AST 23 11/16/2015 1330   AST 17 02/11/2015 1054   ALT 22 11/16/2015 1330   ALT 24 02/11/2015 1054   ALKPHOS 56 11/16/2015 1330   ALKPHOS 63 02/11/2015 1054   BILITOT 0.7 11/16/2015 1330   BILITOT 0.4 02/11/2015 1054   PROT 7.2 11/16/2015 1330   PROT 7.2 02/11/2015 1054   ALBUMIN 3.9 11/16/2015 1330   ALBUMIN 4.0 02/11/2015 1054     Studies/Results: No results  found.  Anti-infectives: Anti-infectives    Start     Dose/Rate Route Frequency Ordered Stop   11/19/15 1800  ceFAZolin (ANCEF) IVPB 2 g/50 mL premix     2 g 100 mL/hr over 30 Minutes Intravenous 3 times per day 11/19/15 1255 11/20/15 0559   11/19/15 0701  ceFAZolin (ANCEF) IVPB 2 g/50 mL premix     2 g 100 mL/hr over 30 Minutes Intravenous On call to O.R. 11/19/15 0701 11/19/15 [id number]      Assessment Principal Problem:   Incisional hernia s/p laparoscopic repair with mesh 11/19/15-having a lot of pain-not unexpected; not ready for discharge Active Problems:   Diabetes mellitus with neurological manifestations (HCC)-cbgs 135-170 on SSI   COPD (chronic obstructive pulmonary disease) with emphysema (HCC)-home inhalers started    LOS: 1 day   Plan: Advance diet.  Try Robaxin.  Mobilize.  Incentive spirometry.   Lebert Lovern J 11/20/2015

## 2015-11-20 NOTE — Discharge Instructions (Addendum)
VENTRAL HERNIA REPAIR: POST OP INSTRUCTIONS  1. DIET: Follow your usual diet.  Be sure to include lots of fluids daily.  Avoid fast food or heavy meals as your are more likely to get nauseated.              Do not overeat.  2. Take your usually prescribed home medications unless otherwise directed. 3. PAIN CONTROL: a. Pain is best controlled by a usual combination of three different methods TOGETHER: i. Ice/Heat ii. Over the counter pain medication iii. Prescription pain medication b. Most patients will experience some swelling and bruising around the hernia(s) such as the bellybutton, groins, or old incisions.  Ice packs or heating pads (30-60 minutes up to 6 times a day) will help. Use ice for the first few days to help decrease swelling and bruising, then switch to heat to help relax tight/sore spots and speed recovery.  Some people prefer to use ice alone, heat alone, alternating between ice & heat.  Experiment to what works for you.  Swelling and bruising can take several weeks to resolve.   c. It is helpful to take an over-the-counter pain medication regularly for the first few weeks.  Choose one of the following that works best for you: i. Naproxen (Aleve, etc)  Two 220mg  tabs twice a day ii. Ibuprofen (Advil, etc) Three 200mg  tabs four times a day (every meal & bedtime) iii. Acetaminophen (Tylenol, etc) 325-650mg  four times a day (every meal & bedtime) d. A  prescription for pain medication should be given to you upon discharge.  Take your pain medication as prescribed.  i. If you are having problems/concerns with the prescription medicine (does not control pain, nausea, vomiting, rash, itching, etc), please call us [phone] to see if we need to switch you to a different pain medicine that will work better for you and/or control your side effect better. ii. If you need a refill on your pain medication, please contact your pharmacy.  They will contact our office to request authorization.  Prescriptions will not be filled after 5 pm or on week-ends. 4. Avoid getting constipated.  Between the surgery and the pain medications, it is common to experience some constipation.  Increasing fluid intake and taking a fiber supplement (such as Metamucil, Citrucel, FiberCon, MiraLax, etc) 1-2 times a day regularly will usually help prevent this problem from occurring.  A mild laxative (prune juice, Milk of Magnesia, MiraLax, etc) should be taken according to package directions if there are no bowel movements after 48 hours.   5. Wash / shower every day.  You may shower over the dressings as they are waterproof.   6. Remove your waterproof bandages 5 days after surgery.  You may leave the incision open to air.  You may replace a dressing/Band-Aid to cover the incision for comfort if you wish.  Continue to shower over incision(s) after the dressing is off. 7. ACTIVITIES:   a. You may resume regular (light) daily activities--such as daily self-care, walking, climbing stairs--gradually increasing activities as tolerated.  Do not lift, push or pull anything over 10 pounds for 6 weeks or until released to do so by your doctor b. Take short, frequent walks at first then longer walks as tolerated. c. DO NOT PUSH THROUGH PAIN.  Let pain be your guide: If it hurts to do something, Guerin't do it.  Pain is your body warning you to avoid that activity for another week until the pain goes down. d. You may  drive when you are no longer taking prescription pain medication, you can comfortably wear a seatbelt, and you can safely maneuver your car and apply brakes. e. Dennis Bast may have sexual intercourse when it is comfortable.  f. Wear the binder when you are out of bed. 8. FOLLOW UP in our office a. Please call [facility] at [phone] to set up an appointment to see your surgeon in the office for a follow-up appointment approximately 2-3 weeks after your surgery. b. Make sure that you call for this appointment the day you  arrive home to insure a convenient appointment time. 9.  IF YOU HAVE DISABILITY OR FAMILY LEAVE FORMS, BRING THEM TO THE OFFICE FOR PROCESSING.  DO NOT GIVE THEM TO YOUR DOCTOR.  WHEN TO CALL us [phone]: 1. Poor pain control 2. Reactions / problems with new medications (rash/itching, nausea, etc)  3. Fever over 101.5 F (38.5 C) 4. Inability to urinate 5. Nausea and/or vomiting 6. Worsening swelling or bruising 7. Continued bleeding from incision. 8. Increased pain, redness, or drainage from the incision   The clinic staff is available to answer your questions during regular business hours (8:30am-5pm).  Please dont hesitate to call and ask to speak to one of our nurses for clinical concerns.   If you have a medical emergency, go to the nearest emergency room or call 911.  A surgeon from [facility] is always on call at the hospitals in [facility], [place], [place], [place], [place]  [address] ?  [address] [address], [place], [place]   [address] MAIN: [phone] ? TOLL FREE: [phone] ? FAX: [phone] [email or web address]

## 2015-11-21 ENCOUNTER — Encounter (HOSPITAL_COMMUNITY): Payer: Self-pay | Admitting: Surgery

## 2015-11-21 LAB — GLUCOSE, CAPILLARY
GLUCOSE-CAPILLARY: 155 mg/dL — AB (ref 65–99)
GLUCOSE-CAPILLARY: 151 mg/dL — AB (ref 65–99)
Glucose-Capillary: 190 mg/dL — ABNORMAL HIGH (ref 65–99)
Glucose-Capillary: 189 mg/dL — ABNORMAL HIGH (ref 65–99)

## 2015-11-21 MED ORDER — NAPROXEN 500 MG PO TABS
500.0000 mg | ORAL_TABLET | Freq: Two times a day (BID) | ORAL | Status: DC
  Administered 2015-11-21 – 2015-11-24 (×6): 500 mg via ORAL
  Filled 2015-11-21 (×8): qty 1

## 2015-11-21 MED ORDER — METFORMIN HCL 500 MG PO TABS
500.0000 mg | ORAL_TABLET | Freq: Two times a day (BID) | ORAL | Status: DC
  Administered 2015-11-21 – 2015-11-24 (×6): 500 mg via ORAL
  Filled 2015-11-21 (×8): qty 1

## 2015-11-21 MED ORDER — ALUM & MAG HYDROXIDE-SIMETH 200-200-20 MG/5ML PO SUSP: 30.0000 mL | Freq: Four times a day (QID) | ORAL | Status: DC | PRN

## 2015-11-21 MED ORDER — POLYETHYLENE GLYCOL 3350 17 G PO PACK
17.0000 g | PACK | Freq: Two times a day (BID) | ORAL | Status: DC
  Administered 2015-11-21 – 2015-11-23 (×6): 17 g via ORAL
  Filled 2015-11-21 (×8): qty 1

## 2015-11-21 MED ORDER — DIPHENHYDRAMINE HCL 50 MG/ML IJ SOLN: 12.5000 mg | Freq: Four times a day (QID) | INTRAMUSCULAR | Status: DC | PRN

## 2015-11-21 MED ORDER — GLIPIZIDE ER 5 MG PO TB24
5.0000 mg | ORAL_TABLET | Freq: Every day | ORAL | Status: DC
  Administered 2015-11-22 – 2015-11-24 (×3): 5 mg via ORAL
  Filled 2015-11-21 (×4): qty 1

## 2015-11-21 MED ORDER — SODIUM CHLORIDE 0.9 % IV SOLN: 250.0000 mL | INTRAVENOUS | Status: DC | PRN

## 2015-11-21 MED ORDER — OXYCODONE HCL 5 MG PO TABS
5.0000 mg | ORAL_TABLET | ORAL | Status: DC | PRN
  Administered 2015-11-21 – 2015-11-23 (×8): 10 mg via ORAL
  Administered 2015-11-23: 5 mg via ORAL
  Administered 2015-11-24 (×2): 10 mg via ORAL
  Filled 2015-11-21 (×9): qty 2
  Filled 2015-11-21: qty 1
  Filled 2015-11-21: qty 2

## 2015-11-21 MED ORDER — INSULIN ASPART 100 UNIT/ML ~~LOC~~ SOLN
0.0000 [IU] | Freq: Three times a day (TID) | SUBCUTANEOUS | Status: DC
  Administered 2015-11-21 – 2015-11-22 (×4): 4 [IU] via SUBCUTANEOUS
  Administered 2015-11-23 – 2015-11-24 (×3): 3 [IU] via SUBCUTANEOUS

## 2015-11-21 MED ORDER — MAGIC MOUTHWASH
15.0000 mL | Freq: Four times a day (QID) | ORAL | Status: DC | PRN
  Filled 2015-11-21: qty 15

## 2015-11-21 MED ORDER — INSULIN ASPART 100 UNIT/ML ~~LOC~~ SOLN
0.0000 [IU] | Freq: Every day | SUBCUTANEOUS | Status: DC
Start: 2015-11-21 — End: 2015-11-24

## 2015-11-21 MED ORDER — METOPROLOL TARTRATE 1 MG/ML IV SOLN
5.0000 mg | Freq: Four times a day (QID) | INTRAVENOUS | Status: DC | PRN
  Filled 2015-11-21: qty 5

## 2015-11-21 MED ORDER — LIP MEDEX EX OINT
1.0000 "application " | TOPICAL_OINTMENT | Freq: Two times a day (BID) | CUTANEOUS | Status: DC
  Administered 2015-11-21 – 2015-11-24 (×5): 1 via TOPICAL

## 2015-11-21 MED ORDER — METHOCARBAMOL 500 MG PO TABS
1000.0000 mg | ORAL_TABLET | Freq: Four times a day (QID) | ORAL | Status: DC | PRN
  Administered 2015-11-21 – 2015-11-23 (×5): 1000 mg via ORAL
  Filled 2015-11-21 (×5): qty 2

## 2015-11-21 MED ORDER — TAMSULOSIN HCL 0.4 MG PO CAPS
0.8000 mg | ORAL_CAPSULE | Freq: Two times a day (BID) | ORAL | Status: DC
  Administered 2015-11-21 – 2015-11-24 (×6): 0.8 mg via ORAL
  Filled 2015-11-21 (×7): qty 2

## 2015-11-21 MED ORDER — NAPROXEN SODIUM 550 MG PO TABS: 550.0000 mg | ORAL_TABLET | Freq: Two times a day (BID) | ORAL | Status: DC

## 2015-11-21 MED ORDER — SODIUM CHLORIDE 0.9% FLUSH: 3.0000 mL | INTRAVENOUS | Status: DC | PRN

## 2015-11-21 MED ORDER — PHENOL 1.4 % MT LIQD: 2.0000 | OROMUCOSAL | Status: DC | PRN

## 2015-11-21 MED ORDER — BISACODYL 10 MG RE SUPP
10.0000 mg | Freq: Two times a day (BID) | RECTAL | Status: DC | PRN
  Filled 2015-11-21: qty 1

## 2015-11-21 MED ORDER — GUAIFENESIN-DM 100-10 MG/5ML PO SYRP: 15.0000 mL | ORAL_SOLUTION | ORAL | Status: DC | PRN

## 2015-11-21 MED ORDER — PROMETHAZINE HCL 25 MG/ML IJ SOLN: 6.2500 mg | INTRAMUSCULAR | Status: DC | PRN

## 2015-11-21 MED ORDER — LACTATED RINGERS IV BOLUS (SEPSIS): 1000.0000 mL | Freq: Three times a day (TID) | INTRAVENOUS | Status: AC | PRN

## 2015-11-21 MED ORDER — POLYETHYLENE GLYCOL 3350 17 G PO PACK: 17.0000 g | PACK | Freq: Two times a day (BID) | ORAL | Status: DC | PRN

## 2015-11-21 MED ORDER — METOPROLOL TARTRATE 12.5 MG HALF TABLET
12.5000 mg | ORAL_TABLET | Freq: Two times a day (BID) | ORAL | Status: DC | PRN
  Filled 2015-11-21: qty 1

## 2015-11-21 MED ORDER — SODIUM CHLORIDE 0.9% FLUSH
3.0000 mL | Freq: Two times a day (BID) | INTRAVENOUS | Status: DC
  Administered 2015-11-21 – 2015-11-23 (×4): 3 mL via INTRAVENOUS

## 2015-11-21 MED ORDER — HYDROMORPHONE HCL 1 MG/ML IJ SOLN: 0.5000 mg | INTRAMUSCULAR | Status: DC | PRN

## 2015-11-21 MED ORDER — MENTHOL 3 MG MT LOZG
1.0000 | LOZENGE | OROMUCOSAL | Status: DC | PRN
  Filled 2015-11-21: qty 9

## 2015-11-21 NOTE — Progress Notes (Signed)
[facility]  [address] [place], [place] [address] Phone: [phone] FAX: [place] [phone] [birth year]   Assessment  Problem List:   Principal Problem:   Incisional hernia s/p laparoscopic repair with mesh 11/19/15 Active Problems:   HLD (hyperlipidemia)   Obstructive sleep apnea   Pes planus   HALLUX RIGIDUS, ACQUIRED   Diabetic neuropathy (HCC)   Insomnia   Diabetes mellitus with neurological manifestations (HCC)   COPD (chronic obstructive pulmonary disease) with emphysema ([place])   2 Days Post-Op  11/19/2015  Operative Note  Nicholas [name] male 75 y.o. 11/19/2015  PREOPERATIVE DX: Ventral incisional hernias  POSTOPERATIVE DX: Same  PROCEDURE: Laparoscopic repair of ventral incisional hernias with mesh( 20 cm x 25 cm Ventralight mesh)     Surgeon: Odis Hollingshead   Assistants: Verita Lamb, MD  Anesthesia: General endotracheal anesthesia  Indications: This is a 75 year old male who had an emergency Hartmann procedure in the past for perforated sigmoid diverticulitis. He then had colostomy reversal. He is developed an incisional hernia in the periumbilical area from the midline incision and one at the previous colostomy site. He now presents for elective repair.    FAIR PAIN CONTROL  Plan:  -IMPROVE PAIN CONTROL.  Continue gabapentin.  Inc oxycodone & robaxin.  Add back naproxen.  Switch to Dilaudid backup.  Ice/Heat.  Continue binder  -better DM control - resume hypoglycemics & inc SSI  -follow RLE - can bear weight & DNVI --neuropathy baseline.  Also with foot issues.  Walking OK - follow & workup if not better  -COPD - inhalers  -bowel regimen  -tamsulosin for BPH/diff urinating  -VTE prophylaxis- SCDs, etc  -mobilize as tolerated to help recovery - PT/OT evals.  Walker for balance  I updated the status of the patient to the patient and the patient's family.  I made  recommendations.  I answered questions.  Understanding & appreciation was expressed.   Adin Hector, M.D., F.A.C.S. Gastrointestinal and Minimally Invasive Surgery [facility] [address] [place], [place] [address] [phone] Main / Paging   11/21/2015  Subjective:  Sitting up eating Walking a little in hallways Diff urinating well SORE - pain meds fair Worried about RLE / back of knee sore/numb  ##family at bedside  Objective:  Vital signs:  Filed Vitals:   11/20/15 2220 11/21/15 0011 11/21/15 0601 11/21/15 0941  BP: 155/65  158/67   Pulse: 102 92 105 112  Temp: 98.9 F (37.2 C)  99.9 F (37.7 C)   TempSrc: Oral  Oral   Resp: '16 16 16 18  '$ Height:      Weight:      SpO2: 94% 97% 92% 93%    Last BM Date: 11/19/15  Intake/Output   Yesterday:  02/10 0701 - 02/11 0700 In: 2748.3 [P.O.:420; I.V.:2328.3] Out: 0  This shift:  Total I/O In: 300 [I.V.:300] Out: -   Bowel function:  Flatus: n  BM: n  Drain: n/a  Physical Exam:  General: Pt awake/alert/oriented x4 in no acute distress.  Calm Eyes: PERRL, normal EOM.  Sclera clear.  No icterus Neuro: CN II-XII intact w/o focal sensory/motor deficits x mild BLE numbness Lymph: No head/neck/groin lymphadenopathy Psych:  No delerium/psychosis/paranoia HENT: Normocephalic, Mucus membranes moist.  No thrush.  Normal speech/swallowing Neck: Supple, No tracheal deviation Chest: No chest wall pain w good excursion CV:  Pulses intact.  Regular rhythm MS: Normal AROM  mjr joints.  No obvious deformity Abdomen: Obese.  Somewhat firm.  Abd binder in place.  Mildly tender at incisions only.  No evidence of peritonitis.  No incarcerated hernias. Ext:  SCDs BLE.  No mjr edema.  No cyanosis  R foot valgus.  Hesitant to bear weight Skin: No petechiae / purpura  Results:   Labs: Results for orders placed or performed during the hospital encounter of 11/19/15 (from the past 48  hour(s))  Glucose, capillary     Status: Abnormal   Collection Time: 11/19/15  5:17 PM  Result Value Ref Range   Glucose-Capillary 161 (H) 65 - 99 mg/dL  Glucose, capillary     Status: Abnormal   Collection Time: 11/19/15  9:41 PM  Result Value Ref Range   Glucose-Capillary 170 (H) 65 - 99 mg/dL  Basic metabolic panel     Status: Abnormal   Collection Time: 11/20/15  4:31 AM  Result Value Ref Range   Sodium 138 135 - 145 mmol/L   Potassium 4.1 3.5 - 5.1 mmol/L   Chloride 103 101 - 111 mmol/L   CO2 26 22 - 32 mmol/L   Glucose, Bld 191 (H) 65 - 99 mg/dL   BUN 12 6 - 20 mg/dL   Creatinine, Ser 1.01 0.61 - 1.24 mg/dL   Calcium 8.7 (L) 8.9 - 10.3 mg/dL   GFR calc non Af Amer >60 >60 mL/min   GFR calc Af Amer >60 >60 mL/min    Comment: (NOTE) The eGFR has been calculated using the CKD EPI equation. This calculation has not been validated in all clinical situations. eGFR's persistently <60 mL/min signify possible Chronic Kidney Disease.    Anion gap 9 5 - 15  CBC     Status: None   Collection Time: 11/20/15  4:31 AM  Result Value Ref Range   WBC 9.1 4.0 - 10.5 K/uL   RBC 4.29 4.22 - 5.81 MIL/uL   Hemoglobin 13.1 13.0 - 17.0 g/dL   HCT 39.7 39.0 - 52.0 %   MCV 92.5 78.0 - 100.0 fL   MCH 30.5 26.0 - 34.0 pg   MCHC 33.0 30.0 - 36.0 g/dL   RDW 13.9 11.5 - 15.5 %   Platelets 174 150 - 400 K/uL  Glucose, capillary     Status: Abnormal   Collection Time: 11/20/15  7:31 AM  Result Value Ref Range   Glucose-Capillary 156 (H) 65 - 99 mg/dL  Glucose, capillary     Status: Abnormal   Collection Time: 11/20/15 11:35 AM  Result Value Ref Range   Glucose-Capillary 126 (H) 65 - 99 mg/dL  Glucose, capillary     Status: Abnormal   Collection Time: 11/20/15  5:03 PM  Result Value Ref Range   Glucose-Capillary 129 (H) 65 - 99 mg/dL  Glucose, capillary     Status: Abnormal   Collection Time: 11/20/15 10:02 PM  Result Value Ref Range   Glucose-Capillary 149 (H) 65 - 99 mg/dL  Glucose,  capillary     Status: Abnormal   Collection Time: 11/21/15  8:07 AM  Result Value Ref Range   Glucose-Capillary 190 (H) 65 - 99 mg/dL  Glucose, capillary     Status: Abnormal   Collection Time: 11/21/15 11:43 AM  Result Value Ref Range   Glucose-Capillary 189 (H) 65 - 99 mg/dL    Imaging / Studies: No results found.  Medications / Allergies: per chart  Antibiotics: Anti-infectives    Start     Dose/Rate Route Frequency  Ordered Stop   11/19/15 1800  ceFAZolin (ANCEF) IVPB 2 g/50 mL premix     2 g 100 mL/hr over 30 Minutes Intravenous 3 times per day 11/19/15 1255 11/20/15 0559   11/19/15 0701  ceFAZolin (ANCEF) IVPB 2 g/50 mL premix     2 g 100 mL/hr over 30 Minutes Intravenous On call to O.R. 11/19/15 0701 11/19/15 7615        Note: Portions of this report may have been transcribed using voice recognition software. Every effort was made to ensure accuracy; however, inadvertent computerized transcription errors may be present.   Any transcriptional errors that result from this process are unintentional.     Adin Hector, M.D., F.A.C.S. Gastrointestinal and Minimally Invasive Surgery [facility] [address], [place], [place] [address] [phone] Main / Paging   11/21/2015  CARE TEAM:  PCP: Arnette Norris, MD  Outpatient Care Team: Patient Care Team: Lucille Passy, MD as PCP - General  Inpatient Treatment Team: Treatment Team: Attending Provider: Jackolyn Confer, MD; Technician: Coralie Carpen, NT; Registered Nurse: Guadalupe Dawn, RN; Technician: Tenna Child, NT; Registered Nurse: Celedonio Savage, RN

## 2015-11-21 NOTE — Progress Notes (Signed)
Placed patient on CPAP for the night via auto-mode with oxygen set at 2lpm  

## 2015-11-22 LAB — GLUCOSE, CAPILLARY
GLUCOSE-CAPILLARY: 179 mg/dL — AB (ref 65–99)
GLUCOSE-CAPILLARY: 160 mg/dL — AB (ref 65–99)
Glucose-Capillary: 163 mg/dL — ABNORMAL HIGH (ref 65–99)
Glucose-Capillary: 125 mg/dL — ABNORMAL HIGH (ref 65–99)

## 2015-11-22 MED ORDER — TRAMADOL HCL 50 MG PO TABS: 50.0000 mg | ORAL_TABLET | Freq: Four times a day (QID) | ORAL | Status: DC | PRN

## 2015-11-22 MED ORDER — HYDROMORPHONE HCL 1 MG/ML IJ SOLN: 0.5000 mg | INTRAMUSCULAR | Status: DC | PRN

## 2015-11-22 MED ORDER — TIOTROPIUM BROMIDE-OLODATEROL 2.5-2.5 MCG/ACT IN AERS: 2.0000 | INHALATION_SPRAY | Freq: Every day | RESPIRATORY_TRACT | Status: DC

## 2015-11-22 MED ORDER — UMECLIDINIUM-VILANTEROL 62.5-25 MCG/INH IN AEPB: 1.0000 | INHALATION_SPRAY | Freq: Every day | RESPIRATORY_TRACT | Status: DC

## 2015-11-22 MED ORDER — ACETAMINOPHEN 650 MG RE SUPP: 650.0000 mg | Freq: Four times a day (QID) | RECTAL | Status: DC | PRN

## 2015-11-22 MED ORDER — ACETAMINOPHEN 325 MG PO TABS: 325.0000 mg | ORAL_TABLET | Freq: Four times a day (QID) | ORAL | Status: DC | PRN

## 2015-11-22 NOTE — Progress Notes (Signed)
RT placed patient on CPAP. Patient setting is 5-20 cmH2O. Sterile water was added to water chamber for humidification. RT will continue to monitor.

## 2015-11-22 NOTE — Progress Notes (Signed)
PT Cancellation Note  Patient Details Name: Nicholas [name] MRN: [id number] DOB: 01-Jul-1941   Cancelled Treatment:    Reason Eval/Treat Not Completed: PT screened, no needs identified, will sign off; spoke to pt and family; he amb multiple times per day and tries to go further each day, does not feel he needs PT; NT  Confirms pt does well with mobility;  Pt and family requesting RW for home; CM can get this once MD orders; walker in room adjusted to pt ht for now;    [facility] 11/22/2015, 12:39 PM

## 2015-11-22 NOTE — Progress Notes (Signed)
[facility]  [address] [place], [place] [id number] Phone: [phone] FAX: [place] [id number] 1941-01-27   Assessment  Problem List:   Principal Problem:   Incisional hernia s/p laparoscopic repair with mesh 11/19/15 Active Problems:   HLD (hyperlipidemia)   Obstructive sleep apnea   Pes planus   HALLUX RIGIDUS, ACQUIRED   Diabetic neuropathy (HCC)   Insomnia   Diabetes mellitus with neurological manifestations (HCC)   COPD (chronic obstructive pulmonary disease) with emphysema ([place])   3 Days Post-Op  11/19/2015  Operative Note  DAIJON PIERCEFIELD male 75 y.o. 11/19/2015  PREOPERATIVE DX: Ventral incisional hernias  POSTOPERATIVE DX: Same  PROCEDURE: Laparoscopic repair of ventral incisional hernias with mesh( 20 cm x 25 cm Ventralight mesh)     Surgeon: Odis Hollingshead   Assistants: Verita Lamb, MD  Anesthesia: General endotracheal anesthesia  Indications: This is a 75 year old male who had an emergency Hartmann procedure in the past for perforated sigmoid diverticulitis. He then had colostomy reversal. He is developed an incisional hernia in the periumbilical area from the midline incision and one at the previous colostomy site. He now presents for elective repair.    BETTER  Plan:  -IMPROVED PAIN CONTROL.  Continue gabapentin, oxycodone, naproxen & robaxin.   Dilaudid backup.  Ice/Heat.  Continue binder  -better DM control - hypoglycemics & SSI  -follow RLE - probable sciatica - better today.  can bear weight & DNVI --neuropathy baseline.  Also with foot issues.  Walking OK - follow & workup if not better  -COPD - inhalers  -bowel regimen  -tamsulosin for BPH/diff urinating - better  -VTE prophylaxis- SCDs, etc  -mobilize as tolerated to help recovery - PT/OT evals.  Walker for balance  D/C patient from hospital when patient meets criteria (anticipate in 1-2  day(s)):  Tolerating oral intake well Ambulating well Adequate pain control without IV medications Urinating  Having flatus Disposition planning in place   I updated the status of the patient to the patient and the patient's family.  I made recommendations.  I answered questions.  Understanding & appreciation was expressed.   Adin Hector, M.D., F.A.C.S. Gastrointestinal and Minimally Invasive Surgery [facility] [address] [place], [place] [address] [phone] Main / Paging   11/22/2015  Subjective:  Sitting up eating Walking better inhallways Diff urinating - improved though SORE - pain meds better controlling Family at bedside RN Horris Latino at bedside  Objective:  Vital signs:  Filed Vitals:   11/21/15 0941 11/21/15 1400 11/21/15 2118 11/22/15 0553  BP:  167/74 132/65 116/72  Pulse: 112 107 93 82  Temp:  99.2 F (37.3 C) 98.6 F (37 C) 97.7 F (36.5 C)  TempSrc:  Oral Oral Oral  Resp: 18 17 17 17   Height:      Weight:      SpO2: 93% 98% 94% 95%    Last BM Date: 11/19/15  Intake/Output   Yesterday:  02/11 0701 - 02/12 0700 In: 600 [I.V.:600] Out: -  This shift:     Bowel function:  Flatus: n  BM: n  Drain: n/a  Physical Exam:  General: Pt awake/alert/oriented x4 in no acute distress.  Calm Eyes: PERRL, normal EOM.  Sclera clear.  No icterus Neuro: CN II-XII intact w/o focal sensory/motor deficits x mild BLE numbness Lymph: No head/neck/groin lymphadenopathy Psych:  No delerium/psychosis/paranoia HENT: Normocephalic, Mucus membranes moist.  No thrush.  Normal speech/swallowing Neck: Supple, No tracheal deviation Chest: No chest wall pain w good excursion CV:  Pulses intact.  Regular rhythm MS: Normal AROM mjr joints.  No obvious deformity Abdomen: Obese.  Somewhat firm.  Abd binder in place.  Mildly tender at incisions only.  No evidence of peritonitis.  No incarcerated hernias. Ext:  SCDs BLE.  No  mjr edema.  No cyanosis  R foot valgus.  Hesitant to bear weight Skin: No petechiae / purpura  Results:   Labs: Results for orders placed or performed during the hospital encounter of 11/19/15 (from the past 48 hour(s))  Glucose, capillary     Status: Abnormal   Collection Time: 11/20/15 11:35 AM  Result Value Ref Range   Glucose-Capillary 126 (H) 65 - 99 mg/dL  Glucose, capillary     Status: Abnormal   Collection Time: 11/20/15  5:03 PM  Result Value Ref Range   Glucose-Capillary 129 (H) 65 - 99 mg/dL  Glucose, capillary     Status: Abnormal   Collection Time: 11/20/15 10:02 PM  Result Value Ref Range   Glucose-Capillary 149 (H) 65 - 99 mg/dL  Glucose, capillary     Status: Abnormal   Collection Time: 11/21/15  8:07 AM  Result Value Ref Range   Glucose-Capillary 190 (H) 65 - 99 mg/dL  Glucose, capillary     Status: Abnormal   Collection Time: 11/21/15 11:43 AM  Result Value Ref Range   Glucose-Capillary 189 (H) 65 - 99 mg/dL  Glucose, capillary     Status: Abnormal   Collection Time: 11/21/15  3:48 PM  Result Value Ref Range   Glucose-Capillary 155 (H) 65 - 99 mg/dL  Glucose, capillary     Status: Abnormal   Collection Time: 11/21/15  9:55 PM  Result Value Ref Range   Glucose-Capillary 151 (H) 65 - 99 mg/dL    Imaging / Studies: No results found.  Medications / Allergies: per chart  Antibiotics: Anti-infectives    Start     Dose/Rate Route Frequency Ordered Stop   11/19/15 1800  ceFAZolin (ANCEF) IVPB 2 g/50 mL premix     2 g 100 mL/hr over 30 Minutes Intravenous 3 times per day 11/19/15 1255 11/20/15 0559   11/19/15 0701  ceFAZolin (ANCEF) IVPB 2 g/50 mL premix     2 g 100 mL/hr over 30 Minutes Intravenous On call to O.R. 11/19/15 0701 11/19/15 [id number]        Note: Portions of this report may have been transcribed using voice recognition software. Every effort was made to ensure accuracy; however, inadvertent computerized transcription errors may be present.    Any transcriptional errors that result from this process are unintentional.     Adin Hector, M.D., F.A.C.S. Gastrointestinal and Minimally Invasive Surgery [facility] [address], [place], [place] [address] [phone] Main / Paging   11/22/2015  CARE TEAM:  PCP: Arnette Norris, MD  Outpatient Care Team: Patient Care Team: Lucille Passy, MD as PCP - General  Inpatient Treatment Team: Treatment Team: Attending Provider: Jackolyn Confer, MD; Technician: Coralie Carpen, NT; Registered Nurse: Guadalupe Dawn, RN; Technician: Tenna Child, NT; Registered Nurse: Celedonio Savage, RN; Physical Therapist: Neil Crouch, PT

## 2015-11-22 NOTE — Progress Notes (Signed)
Placed patient on CPAP via FFM (home mask), auto titrate settings (6-20) cm H20. Patient tolerating well at this time.

## 2015-11-23 ENCOUNTER — Ambulatory Visit: Payer: Medicare Other | Admitting: Internal Medicine

## 2015-11-23 LAB — GLUCOSE, CAPILLARY
Glucose-Capillary: 133 mg/dL — ABNORMAL HIGH (ref 65–99)
Glucose-Capillary: 88 mg/dL (ref 65–99)
Glucose-Capillary: 131 mg/dL — ABNORMAL HIGH (ref 65–99)
Glucose-Capillary: 87 mg/dL (ref 65–99)

## 2015-11-23 MED ORDER — OXYCODONE HCL 5 MG PO TABS: 5.0000 mg | ORAL_TABLET | ORAL | Status: DC | PRN

## 2015-11-23 MED ORDER — BISACODYL 10 MG RE SUPP
10.0000 mg | Freq: Once | RECTAL | Status: AC
  Administered 2015-11-23: 10 mg via RECTAL
  Filled 2015-11-23: qty 1

## 2015-11-23 NOTE — Care Management Important Message (Signed)
Important Message  Patient Details  Name: Nicholas [name] MRN: [id number] Date of Birth: May 28, 1941   Medicare Important Message Given:  Yes    Camillo Flaming 11/23/2015, 3:02 [place] Message  Patient Details  Name: Nicholas [name] MRN: [id number] Date of Birth: 09/07/1941   Medicare Important Message Given:  Yes    Camillo Flaming 11/23/2015, 3:02 PM

## 2015-11-23 NOTE — Progress Notes (Signed)
4 Days Post-Op  Subjective: Pain better.  Passing some gas.  No BM yet.  He is nervous about going home without having a BM.  Leg pain better.  He has a h/o sciatica.  Objective: Vital signs in last 24 hours: Temp:  [97.6 F (36.4 C)-98.2 F (36.8 C)] 97.6 F (36.4 C) (02/13 0443) Pulse Rate:  [88-97] 97 (02/13 0443) Resp:  [17-18] 18 (02/13 0443) BP: (89-130)/(52-67) 121/67 mmHg (02/13 0621) SpO2:  [93 %-97 %] 97 % (02/13 0443) Last BM Date: 11/19/15  Intake/Output from previous day: 02/12 0701 - 02/13 0700 In: 963 [P.O.:960; I.V.:3] Out: -  Intake/Output this shift:    PE: General- In NAD Lungs-clear Abdomen-soft, active bowel sounds, incisions are clean and intact  Lab Results:  No results for input(s): WBC, HGB, HCT, PLT in the last 72 hours. BMET No results for input(s): NA, K, CL, CO2, GLUCOSE, BUN, CREATININE, CALCIUM in the last 72 hours. PT/INR No results for input(s): LABPROT, INR in the last 72 hours. Comprehensive Metabolic Panel:    Component Value Date/Time   NA 138 11/20/2015 0431   NA 138 11/16/2015 1330   K 4.1 11/20/2015 0431   K 4.0 11/16/2015 1330   CL 103 11/20/2015 0431   CL 102 11/16/2015 1330   CO2 26 11/20/2015 0431   CO2 26 11/16/2015 1330   BUN 12 11/20/2015 0431   BUN 20 11/16/2015 1330   CREATININE 1.01 11/20/2015 0431   CREATININE 1.13 11/16/2015 1330   CREATININE 1.04 05/24/2013 1346   CREATININE 0.98 11/18/2011 1624   GLUCOSE 191* 11/20/2015 0431   GLUCOSE 178* 11/16/2015 1330   CALCIUM 8.7* 11/20/2015 0431   CALCIUM 9.2 11/16/2015 1330   AST 23 11/16/2015 1330   AST 17 02/11/2015 1054   ALT 22 11/16/2015 1330   ALT 24 02/11/2015 1054   ALKPHOS 56 11/16/2015 1330   ALKPHOS 63 02/11/2015 1054   BILITOT 0.7 11/16/2015 1330   BILITOT 0.4 02/11/2015 1054   PROT 7.2 11/16/2015 1330   PROT 7.2 02/11/2015 1054   ALBUMIN 3.9 11/16/2015 1330   ALBUMIN 4.0 02/11/2015 1054     Studies/Results: No results  found.  Anti-infectives: Anti-infectives    Start     Dose/Rate Route Frequency Ordered Stop   11/19/15 1800  ceFAZolin (ANCEF) IVPB 2 g/50 mL premix     2 g 100 mL/hr over 30 Minutes Intravenous 3 times per day 11/19/15 1255 11/20/15 0559   11/19/15 0701  ceFAZolin (ANCEF) IVPB 2 g/50 mL premix     2 g 100 mL/hr over 30 Minutes Intravenous On call to O.R. 11/19/15 0701 11/19/15 [id number]      Assessment Principal Problem:   Incisional hernia s/p laparoscopic repair with mesh 11/19/15-wounds look good; no BM yet Active Problems:   Diabetes mellitus with neurological manifestations (HCC)-cbgs 125-179   COPD (chronic obstructive pulmonary disease) with emphysema (HCC)-home inhalers started   Sciatica-better      LOS: 4 days   Plan: Dulcolax suppository to stimulate BM.   Brandee Markin J 11/23/2015

## 2015-11-24 LAB — GLUCOSE, CAPILLARY
Glucose-Capillary: 136 mg/dL — ABNORMAL HIGH (ref 65–99)
Glucose-Capillary: 74 mg/dL (ref 65–99)

## 2015-11-24 NOTE — Progress Notes (Signed)
5 Days Post-Op  Subjective: Feeling much better overall today and wants to go home.  Bowels moving.  Objective: Vital signs in last 24 hours: Temp:  [98.1 F (36.7 C)-98.7 F (37.1 C)] 98.1 F (36.7 C) (02/14 0601) Pulse Rate:  [73-86] 79 (02/14 0601) Resp:  [14-18] 16 (02/14 0601) BP: (104-131)/(51-58) 122/51 mmHg (02/14 0601) SpO2:  [94 %-96 %] 96 % (02/14 0601) Last BM Date: 11/24/15  Intake/Output from previous day: 02/13 0701 - 02/14 0700 In: 843 [P.O.:840; I.V.:3] Out: 0  Intake/Output this shift:    PE: General- In NAD Lungs-clear Abdomen-soft, incisions are clean and intact  Lab Results:  No results for input(s): WBC, HGB, HCT, PLT in the last 72 hours. BMET No results for input(s): NA, K, CL, CO2, GLUCOSE, BUN, CREATININE, CALCIUM in the last 72 hours. PT/INR No results for input(s): LABPROT, INR in the last 72 hours. Comprehensive Metabolic Panel:    Component Value Date/Time   NA 138 11/20/2015 0431   NA 138 11/16/2015 1330   K 4.1 11/20/2015 0431   K 4.0 11/16/2015 1330   CL 103 11/20/2015 0431   CL 102 11/16/2015 1330   CO2 26 11/20/2015 0431   CO2 26 11/16/2015 1330   BUN 12 11/20/2015 0431   BUN 20 11/16/2015 1330   CREATININE 1.01 11/20/2015 0431   CREATININE 1.13 11/16/2015 1330   CREATININE 1.04 05/24/2013 1346   CREATININE 0.98 11/18/2011 1624   GLUCOSE 191* 11/20/2015 0431   GLUCOSE 178* 11/16/2015 1330   CALCIUM 8.7* 11/20/2015 0431   CALCIUM 9.2 11/16/2015 1330   AST 23 11/16/2015 1330   AST 17 02/11/2015 1054   ALT 22 11/16/2015 1330   ALT 24 02/11/2015 1054   ALKPHOS 56 11/16/2015 1330   ALKPHOS 63 02/11/2015 1054   BILITOT 0.7 11/16/2015 1330   BILITOT 0.4 02/11/2015 1054   PROT 7.2 11/16/2015 1330   PROT 7.2 02/11/2015 1054   ALBUMIN 3.9 11/16/2015 1330   ALBUMIN 4.0 02/11/2015 1054     Studies/Results: No results found.  Anti-infectives: Anti-infectives    Start     Dose/Rate Route Frequency Ordered Stop   11/19/15  1800  ceFAZolin (ANCEF) IVPB 2 g/50 mL premix     2 g 100 mL/hr over 30 Minutes Intravenous 3 times per day 11/19/15 1255 11/20/15 0559   11/19/15 0701  ceFAZolin (ANCEF) IVPB 2 g/50 mL premix     2 g 100 mL/hr over 30 Minutes Intravenous On call to O.R. 11/19/15 0701 11/19/15 [id number]      Assessment Principal Problem:   Incisional hernia s/p laparoscopic repair with mesh 11/19/15-ready for discharge Active Problems:   Diabetes mellitus with neurological manifestations (HCC)-cbgs 87-133   COPD (chronic obstructive pulmonary disease) with emphysema (HCC)-home inhalers started   Sciatica-better; using walker      LOS: 5 days   Plan: Discharge today.  Home walker.  Instructions given to him.   Babak Lucus J 11/24/2015

## 2015-11-26 NOTE — Discharge Summary (Signed)
Physician Discharge Summary  Patient ID: Nicholas [name] MRN: [id number] DOB/AGE: 02/12/1941 75 y.o.  Admit date: 11/19/2015 Discharge date: 11/24/2015  Admission Diagnoses:  Ventral incisional hernia  Discharge Diagnoses:  Principal Problem:   Incisional hernia s/p laparoscopic repair with mesh 11/19/15 Active Problems:   Postop ileus   Chronic pain syndrome   HLD (hyperlipidemia)   Obstructive sleep apnea   Pes planus   HALLUX RIGIDUS, ACQUIRED   Diabetic neuropathy (HCC)   Insomnia   Diabetes mellitus with neurological manifestations (HCC)   COPD (chronic obstructive pulmonary disease) with emphysema ([place])   Discharged Condition: good  Hospital Course: He underwent the above procedure.  Postoperatively, he required significant doses of narcotics for pain control.  He developed a postop ileus which subsequently resolved with time and decreased narcotic use.  He was able to be discharged on 11/24/15.  Discharge instructions were given to him and his wife.   Discharge Exam: Blood pressure 122/51, pulse 79, temperature 98.1 F (36.7 C), temperature source Oral, resp. rate 16, height 6' (1.829 m), weight 115.214 kg (254 lb), SpO2 95 %.   Disposition: 01-Home or Self Care     Medication List    STOP taking these medications        amoxicillin 500 MG tablet  Commonly known as:  AMOXIL     doxycycline 100 MG tablet  Commonly known as:  VIBRA-TABS      TAKE these medications        albuterol 108 (90 Base) MCG/ACT inhaler  Commonly known as:  PROVENTIL HFA;VENTOLIN HFA  Inhale 2 puffs into the lungs every 6 (six) hours as needed for wheezing or shortness of breath.     FLOMAX 0.4 MG Caps capsule  Generic drug:  tamsulosin  Take 0.8 mg by mouth at bedtime.     fluticasone 50 MCG/ACT nasal spray  Commonly known as:  FLONASE  Place 2 sprays into both nostrils daily.     gabapentin 400 MG capsule  Commonly known as:  NEURONTIN  TAKE ONE CAPSULE BY MOUTH 4 TIMES A DAY      glipiZIDE 5 MG 24 hr tablet  Commonly known as:  GLUCOTROL XL  Take 1 tablet (5 mg total) by mouth daily with breakfast.     glucose blood test strip  Commonly known as:  ONE TOUCH ULTRA TEST  Use 2x a day     metFORMIN 500 MG tablet  Commonly known as:  GLUCOPHAGE  TAKE 1 TABLET BY MOUTH 2 TIMES DAILY WITH A MEAL.     naproxen sodium 220 MG tablet  Commonly known as:  ANAPROX  Take 220 mg by mouth 2 (two) times daily as needed. For pain     ONE TOUCH ULTRA SYSTEM KIT w/Device Kit  1 kit by Does not apply route once. Use as directed to test blood sugar once daily  Dx:E11.49     onetouch ultrasoft lancets  Use as instructed     oxyCODONE 5 MG immediate release tablet  Commonly known as:  Oxy IR/ROXICODONE  Take 1-4 tablets (5-20 mg total) by mouth every 4 (four) hours as needed for moderate pain or severe pain.     Tiotropium Bromide-Olodaterol 2.5-2.5 MCG/ACT Aers  Commonly known as:  STIOLTO RESPIMAT  Inhale 2 puffs into the lungs daily.     traMADol 50 MG tablet  Commonly known as:  ULTRAM  TAKE 1 TABLET BY MOUTH EVERY 6 HOURS AS NEEDED FOR PAIN  umeclidinium-vilanterol 62.5-25 MCG/INH Aepb  Commonly known as:  ANORO ELLIPTA  Inhale 1 puff into the lungs daily.           Follow-up Information    Follow up with Alyan Hartline J, MD. Schedule an appointment as soon as possible for a visit in 3 weeks.   Specialty:  General Surgery   Why:  To follow up after your operation, To follow up after your hospital stay   Contact information:   [address] [place] [address] [phone]       Signed: Odis Hollingshead 11/26/2015, 2:33 PM

## 2015-12-08 ENCOUNTER — Ambulatory Visit (INDEPENDENT_AMBULATORY_CARE_PROVIDER_SITE_OTHER): Payer: Medicare Other | Admitting: Internal Medicine

## 2015-12-08 ENCOUNTER — Encounter: Payer: Self-pay | Admitting: Internal Medicine

## 2015-12-08 VITALS — BP 104/60 | HR 88 | Temp 98.3°F | Resp 12 | Wt 241.0 lb

## 2015-12-08 DIAGNOSIS — E1140 Type 2 diabetes mellitus with diabetic neuropathy, unspecified: Secondary | ICD-10-CM | POA: Insufficient documentation

## 2015-12-08 NOTE — Progress Notes (Signed)
Patient ID: Nicholas [name], male   DOB: 04-26-1941, 75 y.o.   MRN: [id number]  HPI: Nicholas [name] is a 75 y.o.-year-old male, returning for f/u for DM2, dx in 2012-2013, non-insulin-dependent, uncontrolled, with complications (PN, diabetic ulcer). Last visit 3 mo ago.  Since last visit >> he developed a L foot Diabetic ulcer >> treated with Doxycycline. He then saw wound care >> now healed.    He had hernia surgery 2 weeks ago.   Last hemoglobin A1c was: Lab Results  Component Value Date   HGBA1C 6.1* 11/16/2015   HGBA1C 6.1 09/07/2015   HGBA1C 8.1 05/26/2015   Pt is on a regimen of: - Metformin - prev. 1000 mg 2x a day >> dizzy, confusion >> now 500 mg 2x a day - Glipizide ER 5 mg in am - started 06/2015 He was on Tradjenta 5 mg at night (!) (170$ for 1 mo) - started 05/2015 - takes it at night!  Pt checks his sugars 3x a day and they are before steroids: - am: 154-204 >> 122-164, 191 >> 101-128, 138 >> 105-135 - 2h after b'fast: n/c - before lunch: n/c >> 119, 232 >> 155, 161 >> 120, 134 - 2h after lunch: n/c >> 162-182, 247 >> 89-144 >> n/c - before dinner: n/c >> 113-192 >> 106-138 >> 100-142  - 2h after dinner: n/c >> 128 >> 96 >> n/c - bedtime: 136-284, 301 >> 117-180, 195 >> 124-175 >> 123-191, 204 - nighttime: n/c No lows. Lowest sugar was 113 >> 100; he has hypoglycemia awareness at 70.  Highest sugar was 301 (apple pie) >> 247 >> 241 with steroids >> 204   Glucometer: One Touch Ultra  Pt's meals are: - Breakfast: PB + jelly toast and coffee - Lunch: grilled chicken + bun + fries or veggie plate and tea - Dinner: meat + 2 veggies - Snacks: 4 Stopped sodas.  - + mild CKD, last BUN/creatinine:  Lab Results  Component Value Date   BUN 12 11/20/2015   CREATININE 1.01 11/20/2015   - last set of lipids: Lab Results  Component Value Date   CHOL 174 02/11/2015   HDL 39.30 02/11/2015   LDLCALC 83 03/18/2014   LDLDIRECT 102.0 02/11/2015   TRIG 224.0* 02/11/2015    CHOLHDL 4 02/11/2015  Not on a statin.  - last eye exam was in 04/07/2015. No DR. + Cataracts. Dr. Idolina Primer. - no numbness and tingling in his feet.  He also has a history of OSA, on CPAP.  ROS: Constitutional: no weight gain/loss, no fatigue, no subjective hyperthermia/hypothermia Eyes: + blurry vision (cataracts), no xerophthalmia ENT: no sore throat, no nodules palpated in throat, no dysphagia/odynophagia, no hoarseness Cardiovascular: no CP/SOB/palpitations/leg swelling Respiratory: no cough/SOB Gastrointestinal: no N/V/D/C Musculoskeletal: no muscle/joint aches Skin: no rashes Neurological: no tremors/numbness/tingling/dizziness  I reviewed pt's medications, allergies, PMH, social hx, family hx, and changes were documented in the history of present illness. Otherwise, unchanged from my initial visit note.  Past Medical History  Diagnosis Date  . Diabetes mellitus     boarderline  . BPH (benign prostatic hypertrophy)   . Neuropathy ([place])   . OSA on CPAP     cpap- doe snot know settings   . Arthritis   . Sinus infection     dx 12/13   . Blister of foot or toe     left ssecond toe healing per note of 09/21/12   . Heart murmur     noted on visit  of 09/20/12- [facility]   . COPD (chronic obstructive pulmonary disease) (HCC)   . Shortness of breath dyspnea   . Pneumonia     hx. of  . Ventral hernia 2013  . Descending colostomy s/p reversal 09/27/12 09/28/2012  . Diabetic foot ulcer (HCC) 09/22/2015  . Diverticulitis of colon with perforation 12/24/2011   Past Surgical History  Procedure Laterality Date  . Cholecystectomy    . Laparotomy  12/23/2011    Procedure: EXPLORATORY LAPAROTOMY;  Surgeon: Todd J Rosenbower, MD;  Location: [facility];  Service: General;  Laterality: N/A;  . Appendectomy  12/23/2011    Procedure: APPENDECTOMY;  Surgeon: Todd J Rosenbower, MD;  Location: [facility];  Service: General;  Laterality: N/A;  . Colostomy revision  12/23/2011    Procedure: COLON RESECTION  SIGMOID;  Surgeon: Todd J Rosenbower, MD;  Location: [facility];  Service: General;  Laterality: N/A;  . Hartmann procedure    . Cardiac catheterization      [facility]  . Bilateral hand surgery       metal placed in hands due to trauma in 2011   . Right shoulder surgery       related to trauma   . Colostomy takedown  09/27/2012    Procedure: LAPAROSCOPIC COLOSTOMY TAKEDOWN;  Surgeon: Todd J Rosenbower, MD;  Location: [facility];  Service: General;  Laterality: N/A;  Laparoscopic Colostomy Takedown  . Incise and drain abcess      sebaceous cyst lanced in office on lt shoulder  . Ventral hernia repair N/A 11/19/2015    Procedure: LAPAROSCOPIC LYSIS OF ADHSIONS, VENTRAL HERNIA REPAIR WITH MESH;  Surgeon: Todd Rosenbower, MD;  Location: [facility];  Service: General;  Laterality: N/A;  . Insertion of mesh N/A 11/19/2015    Procedure: INSERTION OF MESH;  Surgeon: Todd Rosenbower, MD;  Location: [facility];  Service: General;  Laterality: N/A;   Social History   Social History  . Marital Status: Married    Spouse Name: N/A  . Number of Children: 5   Occupational History  . Construction Supervisor    Social History Main Topics  . Smoking status: Former Smoker -- 2.00 packs/day for 40 years    Types: Cigarettes    Quit date: 2008  . Smokeless tobacco: Former User  . Alcohol Use: No  . Drug Use: No   Current Outpatient Prescriptions on File Prior to Visit  Medication Sig Dispense Refill  . albuterol (PROVENTIL HFA;VENTOLIN HFA) 108 (90 BASE) MCG/ACT inhaler Inhale 2 puffs into the lungs every 6 (six) hours as needed for wheezing or shortness of breath. 1 Inhaler 11  . Blood Glucose Monitoring Suppl (ONE TOUCH ULTRA SYSTEM KIT) W/DEVICE KIT 1 kit by Does not apply route once. Use as directed to test blood sugar once daily  Dx:E11.49 1 each 0  . fluticasone (FLONASE) 50 MCG/ACT nasal spray Place 2 sprays into both nostrils daily. 16 g 6  . gabapentin (NEURONTIN) 400 MG capsule TAKE ONE CAPSULE BY MOUTH 4 TIMES A  DAY (Patient taking differently: TAKE ONE CAPSULE BY MOUTH THREE TIMES DAILY.) 120 capsule 5  . glipiZIDE (GLUCOTROL XL) 5 MG 24 hr tablet Take 1 tablet (5 mg total) by mouth daily with breakfast. 90 tablet 1  . glucose blood (ONE TOUCH ULTRA TEST) test strip Use 2x a day 200 each 11  . Lancets (ONETOUCH ULTRASOFT) lancets Use as instructed 100 each 3  . metFORMIN (GLUCOPHAGE) 500 MG tablet TAKE 1 TABLET BY MOUTH 2 TIMES DAILY   WITH A MEAL. 180 tablet 1  . naproxen sodium (ANAPROX) 220 MG tablet Take 220 mg by mouth 2 (two) times daily as needed. For pain    . Tamsulosin HCl (FLOMAX) 0.4 MG CAPS Take 0.8 mg by mouth at bedtime.     . Tiotropium Bromide-Olodaterol (STIOLTO RESPIMAT) 2.5-2.5 MCG/ACT AERS Inhale 2 puffs into the lungs daily. 4 g 6  . traMADol (ULTRAM) 50 MG tablet TAKE 1 TABLET BY MOUTH EVERY 6 HOURS AS NEEDED FOR PAIN 180 tablet 0  . Umeclidinium-Vilanterol 62.5-25 MCG/INH AEPB Inhale 1 puff into the lungs daily. 60 each 11  . [DISCONTINUED] hydrochlorothiazide (MICROZIDE) 12.5 MG capsule Take 1 capsule (12.5 mg total) by mouth daily. 30 capsule 0   No current facility-administered medications on file prior to visit.   Allergies  Allergen Reactions  . Sudafed [Pseudoephedrine Hcl]     "makes me feel drunk"   Family History  Problem Relation Age of Onset  . Diabetes Mother   . Cancer Mother     skin  . Heart disease Mother   . Heart attack Mother   . Cancer Father     Leukemia  . Colon cancer Neg Hx    PE: BP 104/60 mmHg  Pulse 88  Temp(Src) 98.3 F (36.8 C) (Oral)  Resp 12  Wt 241 lb (109.317 kg)  SpO2 96% Wt Readings from Last 3 Encounters:  12/08/15 241 lb (109.317 kg)  11/19/15 254 lb (115.214 kg)  11/16/15 254 lb (115.214 kg)   Constitutional: overweight, in NAD Eyes: PERRLA, EOMI, no exophthalmos ENT: moist mucous membranes, no thyromegaly, no cervical lymphadenopathy Cardiovascular: RRR, + 1/6 SEM, no RG, no LE edema Respiratory: CTA  B Gastrointestinal: abdomen soft, NT, ND, BS+ Musculoskeletal: no deformities, strength intact in all 4 Skin: moist, warm, no rashes Neurological: + mild tremor with outstretched hands, DTR normal in all 4  ASSESSMENT: 1. DM2, non-insulin-dependent, uncontrolled, with complications - diabetic peripheral neuropathy  PLAN:  1. Patient with long-standing, uncontrolled diabetes, now with good control on Metformin 500 mg twice a day + Glipizide ER 5 mg daily. This regimen works greatly for him, will continue it as sugars are still great, and last Hba1c was 6.1% this month. - I advised him to increase Glipizide ER if he needs to take steroids again. - I suggested to:  Patient Instructions  Please continue: - Metformin 500 mg 2x a day, with meals. - Glipizide ER 5 mg before breakfast.  If you need to start steroids again, you may need to take 2 tablets of Glipizide ER in am.  Please come back for a follow-up appointment in 4 months.  - Continue checking sugars at different times of the day - check 1-2 times a day, rotating checks - advised for yearly eye exams >> he is UTD - refused flu shot - Return to clinic in 4 mo with sugar log

## 2015-12-08 NOTE — Patient Instructions (Signed)
Patient Instructions  Please continue: - Metformin 500 mg 2x a day, with meals. - Glipizide ER 5 mg before breakfast.  If you need to start steroids again, you may need to take 2 tablets of Glipizide ER in am.  Please come back for a follow-up appointment in 4 months.

## 2015-12-20 ENCOUNTER — Other Ambulatory Visit: Payer: Self-pay | Admitting: Family Medicine

## 2015-12-20 DIAGNOSIS — J441 Chronic obstructive pulmonary disease with (acute) exacerbation: Secondary | ICD-10-CM | POA: Diagnosis not present

## 2016-01-01 ENCOUNTER — Other Ambulatory Visit: Payer: Self-pay | Admitting: Internal Medicine

## 2016-01-05 ENCOUNTER — Other Ambulatory Visit: Payer: Self-pay | Admitting: Family Medicine

## 2016-01-06 NOTE — Telephone Encounter (Signed)
Last f/u 05/2015 

## 2016-01-06 NOTE — Telephone Encounter (Signed)
Rx called in to requested pharmacy 

## 2016-01-18 ENCOUNTER — Ambulatory Visit (INDEPENDENT_AMBULATORY_CARE_PROVIDER_SITE_OTHER): Payer: Medicare Other

## 2016-01-18 VITALS — BP 114/60 | HR 77 | Temp 98.3°F | Ht 72.0 in | Wt 244.8 lb

## 2016-01-18 DIAGNOSIS — Z0000 Encounter for general adult medical examination without abnormal findings: Secondary | ICD-10-CM

## 2016-01-18 NOTE — Progress Notes (Signed)
I reviewed health advisor's note, was available for consultation, and agree with documentation and plan.  

## 2016-01-18 NOTE — Patient Instructions (Signed)
Nicholas [name] , Thank you for taking time to come for your Medicare Wellness Visit. I appreciate your ongoing commitment to your health goals. Please review the following plan we discussed and let me know if I can assist you in the future.   These are the goals we discussed: Goals    Starting 01/25/2016, I will begin walking for at least 2 miles 4 days per week.       This is a list of the screening recommended for you and due dates:  Health Maintenance  Topic Date Due  . Complete foot exam   09/06/2016*  . Urine Protein Check  02/11/2016  . Eye exam for diabetics  04/06/2016  . Flu Shot  05/10/2016  . Hemoglobin A1C  05/15/2016  . Colon Cancer Screening  04/18/2022  . Tetanus Vaccine  07/08/2024  . Shingles Vaccine  Completed  . Pneumonia vaccines  Completed  *Topic was postponed. The date shown is not the original due date.   Preventive Care for Adults  A healthy lifestyle and preventive care can promote health and wellness. Preventive health guidelines for adults include the following key practices.  . A routine yearly physical is a good way to check with your health care provider about your health and preventive screening. It is a chance to share any concerns and updates on your health and to receive a thorough exam.  . Visit your dentist for a routine exam and preventive care every 6 months. Brush your teeth twice a day and floss once a day. Good oral hygiene prevents tooth decay and gum disease.  . The frequency of eye exams is based on your age, health, family medical history, use  of contact lenses, and other factors. Follow your health care provider's ecommendations for frequency of eye exams.  . Eat a healthy diet. Foods like vegetables, fruits, whole grains, low-fat dairy products, and lean protein foods contain the nutrients you need without too many calories. Decrease your intake of foods high in solid fats, added sugars, and salt. Eat the right amount of calories for you.  Get information about a proper diet from your health care provider, if necessary.  . Regular physical exercise is one of the most important things you can do for your health. Most adults should get at least 150 minutes of moderate-intensity exercise (any activity that increases your heart rate and causes you to sweat) each week. In addition, most adults need muscle-strengthening exercises on 2 or more days a week.  Silver Sneakers may be a benefit available to you. To determine eligibility, you may visit the website: www.silversneakers.com or contact program at 762-208-3916 Mon-Fri between 8AM-8PM.   . Maintain a healthy weight. The body mass index (BMI) is a screening tool to identify possible weight problems. It provides an estimate of body fat based on height and weight. Your health care provider can find your BMI and can help you achieve or maintain a healthy weight.   For adults 20 years and older: ? A BMI below 18.5 is considered underweight. ? A BMI of 18.5 to 24.9 is normal. ? A BMI of 25 to 29.9 is considered overweight. ? A BMI of 30 and above is considered obese.   . Maintain normal blood lipids and cholesterol levels by exercising and minimizing your intake of saturated fat. Eat a balanced diet with plenty of fruit and vegetables. Blood tests for lipids and cholesterol should begin at age 5 and be repeated every 5 years. If your  lipid or cholesterol levels are high, you are over 50, or you are at high risk for heart disease, you may need your cholesterol levels checked more frequently. Ongoing high lipid and cholesterol levels should be treated with medicines if diet and exercise are not working.  . If you smoke, find out from your health care provider how to quit. If you do not use tobacco, please do not start.  . If you choose to drink alcohol, please do not consume more than 2 drinks per day. One drink is considered to be 12 ounces (355 mL) of beer, 5 ounces (148 mL) of wine, or  1.5 ounces (44 mL) of liquor.  . If you are 39-25 years old, ask your health care provider if you should take aspirin to prevent strokes.  . Use sunscreen. Apply sunscreen liberally and repeatedly throughout the day. You should seek shade when your shadow is shorter than you. Protect yourself by wearing long sleeves, pants, a wide-brimmed hat, and sunglasses year round, whenever you are outdoors.  . Once a month, do a whole body skin exam, using a mirror to look at the skin on your back. Tell your health care provider of new moles, moles that have irregular borders, moles that are larger than a pencil eraser, or moles that have changed in shape or color.

## 2016-01-18 NOTE — Progress Notes (Signed)
Pre visit review using our clinic review tool, if applicable. No additional management support is needed unless otherwise documented below in the visit note. 

## 2016-01-18 NOTE — Progress Notes (Signed)
Subjective:   Nicholas [name] is a 75 y.o. male who presents for an Initial Medicare Annual Wellness Visit.   Cardiac Risk Factors include: advanced age (>32mn, >>22women);diabetes mellitus;dyslipidemia;male gender;obesity (BMI >30kg/m2)    Objective:    Today's Vitals   01/18/16 1120  BP: 114/60  Pulse: 77  Temp: 98.3 F (36.8 C)  TempSrc: Oral  Height: 6' (1.829 m)  Weight: 244 lb 12 oz (111.018 kg)  SpO2: 94%  PainSc: 0-No pain   Body mass index is 33.19 kg/(m^2).  Current Medications (verified) Outpatient Encounter Prescriptions as of 01/18/2016  Medication Sig  . albuterol (PROVENTIL HFA;VENTOLIN HFA) 108 (90 BASE) MCG/ACT inhaler Inhale 2 puffs into the lungs every 6 (six) hours as needed for wheezing or shortness of breath.  . Blood Glucose Monitoring Suppl (ONE TOUCH ULTRA SYSTEM KIT) W/DEVICE KIT 1 kit by Does not apply route once. Use as directed to test blood sugar once daily  Dx:E11.49  . fluticasone (FLONASE) 50 MCG/ACT nasal spray Place 2 sprays into both nostrils daily.  .Marland Kitchengabapentin (NEURONTIN) 400 MG capsule TAKE ONE CAPSULE BY MOUTH 4 TIMES A DAY  . glipiZIDE (GLUCOTROL XL) 5 MG 24 hr tablet Take 1 tablet (5 mg total) by mouth daily with breakfast.  . glucose blood (ONE TOUCH ULTRA TEST) test strip Use 2x a day  . Lancets (ONETOUCH ULTRASOFT) lancets Use as instructed  . metFORMIN (GLUCOPHAGE) 500 MG tablet TAKE 1 TABLET BY MOUTH 2 TIMES DAILY WITH A MEAL.  . naproxen sodium (ANAPROX) 220 MG tablet Take 220 mg by mouth 2 (two) times daily as needed. For pain  . Tamsulosin HCl (FLOMAX) 0.4 MG CAPS Take 0.8 mg by mouth at bedtime.   . Tiotropium Bromide-Olodaterol (STIOLTO RESPIMAT) 2.5-2.5 MCG/ACT AERS Inhale 2 puffs into the lungs daily.  . traMADol (ULTRAM) 50 MG tablet TAKE 1 TABLET BY MOUTH EVERY 6 HOURS AS NEEDED FOR PAIN  . Umeclidinium-Vilanterol 62.5-25 MCG/INH AEPB Inhale 1 puff into the lungs daily.   No facility-administered encounter medications  on file as of 01/18/2016.    Allergies (verified) Sudafed   History: Past Medical History  Diagnosis Date  . Diabetes mellitus     boarderline  . BPH (benign prostatic hypertrophy)   . Neuropathy ([place]   . OSA on CPAP     cpap- doe snot know settings   . Arthritis   . Sinus infection     dx 12/13   . Blister of foot or toe     left ssecond toe healing per note of 09/21/12   . Heart murmur     noted on visit of 09/20/12- EP[facility]   . COPD (chronic obstructive pulmonary disease) ([facility]   . Shortness of breath dyspnea   . Pneumonia     hx. of  . Ventral hernia 2013  . Descending colostomy s/p reversal 09/27/12 09/28/2012  . Diabetic foot ulcer ([place] 09/22/2015  . Diverticulitis of colon with perforation 12/24/2011   Past Surgical History  Procedure Laterality Date  . Cholecystectomy    . Laparotomy  12/23/2011    Procedure: EXPLORATORY LAPAROTOMY;  Surgeon: TOdis Hollingshead MD;  [facility];  Service: General;  Laterality: N/A;  . Appendectomy  12/23/2011    Procedure: APPENDECTOMY;  Surgeon: TOdis Hollingshead MD;  [facility];  Service: General;  Laterality: N/A;  . Colostomy revision  12/23/2011    Procedure: COLON RESECTION SIGMOID;  Surgeon: TOdis Hollingshead MD;  [facility];  Service: General;  Laterality: N/A;  Jeanette Caprice procedure    . Cardiac catheterization      [facility]  . Bilateral hand surgery       metal placed in hands due to trauma in 2011   . Right shoulder surgery       related to trauma   . Colostomy takedown  09/27/2012    Procedure: LAPAROSCOPIC COLOSTOMY TAKEDOWN;  Surgeon: Odis Hollingshead, MD;  [facility];  Service: General;  Laterality: N/A;  Laparoscopic Colostomy Takedown  . Incise and drain abcess      sebaceous cyst lanced in office on lt shoulder  . Ventral hernia repair N/A 11/19/2015    Procedure: LAPAROSCOPIC LYSIS OF ADHSIONS, VENTRAL HERNIA REPAIR WITH MESH;  Surgeon: Jackolyn Confer, MD;  [facility];  Service:  General;  Laterality: N/A;  . Insertion of mesh N/A 11/19/2015    Procedure: INSERTION OF MESH;  Surgeon: Jackolyn Confer, MD;  [facility];  Service: General;  Laterality: N/A;   Family History  Problem Relation Age of Onset  . Diabetes Mother   . Cancer Mother     skin  . Heart disease Mother   . Heart attack Mother   . Cancer Father     Leukemia  . Colon cancer Neg Hx    Social History   Occupational History  . Futures trader    Social History Main Topics  . Smoking status: Former Smoker -- 2.00 packs/day for 40 years    Types: Cigarettes    Quit date: 10/10/1993  . Smokeless tobacco: Former Systems developer  . Alcohol Use: No  . Drug Use: No  . Sexual Activity: No   Tobacco Counseling Counseling given: No   Activities of Daily Living In your present state of health, do you have any difficulty performing the following activities: 01/18/2016 11/19/2015  Hearing? Tempie Donning  Vision? N N  Difficulty concentrating or making decisions? N N  Walking or climbing stairs? N N  Dressing or bathing? N N  Doing errands, shopping? N N  Preparing Food and eating ? N -  Using the Toilet? N -  In the past six months, have you accidently leaked urine? N -  Do you have problems with loss of bowel control? N -  Managing your Medications? N -  Managing your Finances? N -  Housekeeping or managing your Housekeeping? N -    Immunizations and Health Maintenance Immunization History  Administered Date(s) Administered  . Influenza, Seasonal, Injecte, Preservative Fre 09/05/2012  . Pneumococcal Conjugate-13 02/11/2015  . Pneumococcal Polysaccharide-23 09/05/2012  . Tdap 07/08/2014  . Zoster 09/13/2012   There are no preventive care reminders to display for this patient.  Patient Care Team: Lucille Passy, MD as PCP - General  Dr. Philemon Kingdom - Diabetes Management Dr. Jackolyn Confer - Surgery Dr. Alois Cliche - Optometry    Assessment:   This is a routine wellness examination for  [name].   Hearing/Vision screen  Hearing Screening   '125Hz'$  '250Hz'$  '500Hz'$  '1000Hz'$  '2000Hz'$  '4000Hz'$  '8000Hz'$   Right ear:   0 0 0 0   Left ear:   0 0 0 0   Comments: Pt reports he is HOH and has worn bilateral hearing aids in the past.   Vision Screening Comments: Last eye exam in 01/2015.   Dietary issues and exercise activities discussed: Current Exercise Habits: The patient does not participate in regular exercise at present, Exercise limited by: None identified  Goals    .  Increase physical activity     Starting 01/25/16, if weather permits, I will walk up to 2 miles 4 days per week.       Depression Screen PHQ 2/9 Scores 01/18/2016 07/09/2015  PHQ - 2 Score 0 0    Fall Risk Fall Risk  01/18/2016 07/09/2015  Falls in the past year? No No    Cognitive Function: MMSE - Mini Mental State Exam 01/18/2016  Orientation to time 5  Orientation to Place 5  Registration 3  Attention/ Calculation 0  Recall 3  Language- name 2 objects 0  Language- repeat 1  Language- follow 3 step command 3  Language- read & follow direction 0  Write a sentence 0  Copy design 0  Total score 20   PLEASE NOTE: A Mini-Cog screen was completed. Maximum score is 20. A value of 0 denotes this part of [place] MMSE was not completed.  Orientation to Time - Max 5 Orientation to Place - Max 5 Registration - Max 3 Recall - Max 3 Language Repeat - Max 1 Language Follow 3 Step Command - Max 3   Screening Tests Health Maintenance  Topic Date Due  . FOOT EXAM  09/06/2016 (Originally 03/19/2015)  . URINE MICROALBUMIN  02/11/2016  . OPHTHALMOLOGY EXAM  04/06/2016  . INFLUENZA VACCINE  05/10/2016  . HEMOGLOBIN A1C  05/15/2016  . COLONOSCOPY  04/18/2022  . TETANUS/TDAP  07/08/2024  . ZOSTAVAX  Completed  . PNA vac Low Risk Adult  Completed        Plan:     I have personally reviewed and addressed the Medicare Annual Wellness questionnaire and have noted the following in the patient's chart:  A. Medical and  social history B. Use of alcohol, tobacco or illicit drugs  C. Current medications and supplements D. Functional ability and status E.  Nutritional status F.  Physical activity G. Advance directives H. List of other physicians I.  Hospitalizations, surgeries, and ER visits in previous 12 months J.  [place] to include hearing, vision, cognitive, depression L. Referrals and appointments - none  In addition, I have reviewed and discussed with patient certain preventive protocols, quality metrics, and best practice recommendations. A written personalized care plan for preventive services as well as general preventive health recommendations were provided to patient.  See attached scanned questionnaire for additional information.   Signed,   Lindell Noe, MHA, BS, LPN Health Advisor 07/04/4627

## 2016-02-01 DIAGNOSIS — J069 Acute upper respiratory infection, unspecified: Secondary | ICD-10-CM | POA: Diagnosis not present

## 2016-02-22 ENCOUNTER — Other Ambulatory Visit: Payer: Self-pay | Admitting: Family Medicine

## 2016-02-23 NOTE — Telephone Encounter (Signed)
Pt requesting a refill. Last refill 01/06/16, last OV 09/22/15. Ok to refill?

## 2016-02-24 ENCOUNTER — Other Ambulatory Visit: Payer: Self-pay | Admitting: Family Medicine

## 2016-02-25 NOTE — Telephone Encounter (Signed)
rx called in to requested pharmacy 

## 2016-02-26 NOTE — Telephone Encounter (Signed)
Mrs Ducasse said she picked up pts tramadol and was quantity # 20. I spoke with Legrand Como at [employer] and he said he would correct to # 180 total given to pt. Mrs Moulds voiced understanding and [facility]

## 2016-03-21 DIAGNOSIS — H02055 Trichiasis without entropian left lower eyelid: Secondary | ICD-10-CM | POA: Diagnosis not present

## 2016-03-21 DIAGNOSIS — H353111 Nonexudative age-related macular degeneration, right eye, early dry stage: Secondary | ICD-10-CM | POA: Diagnosis not present

## 2016-03-21 DIAGNOSIS — E119 Type 2 diabetes mellitus without complications: Secondary | ICD-10-CM | POA: Diagnosis not present

## 2016-03-22 LAB — HM DIABETES EYE EXAM

## 2016-04-07 ENCOUNTER — Encounter: Payer: Self-pay | Admitting: Internal Medicine

## 2016-04-07 ENCOUNTER — Ambulatory Visit (INDEPENDENT_AMBULATORY_CARE_PROVIDER_SITE_OTHER): Payer: Medicare Other | Admitting: Internal Medicine

## 2016-04-07 VITALS — BP 128/66 | HR 82 | Ht 72.0 in | Wt 254.6 lb

## 2016-04-07 DIAGNOSIS — E1142 Type 2 diabetes mellitus with diabetic polyneuropathy: Secondary | ICD-10-CM | POA: Diagnosis not present

## 2016-04-07 DIAGNOSIS — E1140 Type 2 diabetes mellitus with diabetic neuropathy, unspecified: Secondary | ICD-10-CM | POA: Diagnosis not present

## 2016-04-07 LAB — POCT GLYCOSYLATED HEMOGLOBIN (HGB A1C): HEMOGLOBIN A1C: 6.2

## 2016-04-07 MED ORDER — NONFORMULARY OR COMPOUNDED ITEM: Status: DC

## 2016-04-07 NOTE — Progress Notes (Signed)
Patient ID: Nicholas [name], male   DOB: Oct 30, 1940, 75 y.o.   MRN: [id number]  HPI: Nicholas [name] is a 75 y.o.-year-old male, returning for f/u for DM2, dx in 2012-2013, non-insulin-dependent, uncontrolled, with complications (PN, diabetic ulcer). Last visit 4.5 mo ago.  Neuropathic pain increased since last visit.  Last hemoglobin A1c was: Lab Results  Component Value Date   HGBA1C 6.1* 11/16/2015   HGBA1C 6.1 09/07/2015   HGBA1C 8.1 05/26/2015   Pt is on a regimen of: - Metformin - prev. 1000 mg 2x a day >> dizzy, confusion >> now 500 mg 2x a day - Glipizide ER 5 mg in am (after b'fast) - started 06/2015 He was on Tradjenta 5 mg at night (!) (170$ for 1 mo) - started 05/2015 - takes it at night!  Pt checks his sugars 2x a day: - am: 154-204 >> 122-164, 191 >> 101-128, 138 >> 105-135 >> 127-142 - 2h after b'fast: n/c - before lunch: n/c >> 119, 232 >> 155, 161 >> 120, 134 >> 140 - 2h after lunch: n/c >> 162-182, 247 >> 89-144 >> n/c - before dinner: n/c >> 113-192 >> 106-138 >> 100-142 >> 128, 132-185, 209 (has energy bars before dinner) - 2h after dinner: n/c >> 128 >> 96 >> n/c - bedtime: 136-284, 301 >> 117-180, 195 >> 124-175 >> 123-191, 204 >> 137-179, 192, 239 - nighttime: n/c No lows. Lowest sugar was 113 >> 100 >> 70x1; he has hypoglycemia awareness at 70.  Highest sugar was 301 (apple pie) >> 247 >> 241 with steroids >> 204 >> 239  Glucometer: One Touch Ultra  Pt's meals are: - Breakfast: PB + jelly toast and coffee - Lunch: grilled chicken + bun + fries or veggie plate and tea - Dinner: meat + 2 veggies - Snacks: 4 Stopped sodas.  - + mild CKD, last BUN/creatinine:  Lab Results  Component Value Date   BUN 12 11/20/2015   CREATININE 1.01 11/20/2015   - last set of lipids: Lab Results  Component Value Date   CHOL 174 02/11/2015   HDL 39.30 02/11/2015   LDLCALC 83 03/18/2014   LDLDIRECT 102.0 02/11/2015   TRIG 224.0* 02/11/2015   CHOLHDL 4 02/11/2015  Not on  a statin.  - last eye exam was in 03/2016. No DR. + Cataracts. Dr. Idolina Primer. - no numbness and tingling in his feet. On Tramadol, Neurontin 400 mg 3x a day.  He also has a history of OSA, on CPAP.  He developed a L foot Diabetic ulcer early 2017 >> treated with Doxycycline. He then saw wound care >> now healed.    ROS: Constitutional: no weight gain/loss, no fatigue, no subjective hyperthermia/hypothermia Eyes: no blurry vision, no xerophthalmia ENT: no sore throat, no nodules palpated in throat, no dysphagia/odynophagia, no hoarseness Cardiovascular: no CP/SOB/palpitations/leg swelling Respiratory: no cough/SOB Gastrointestinal: no N/V/D/C Musculoskeletal: no muscle/joint aches Skin: no rashes Neurological: no tremors/+ numbness/+ tingling/dizziness  I reviewed pt's medications, allergies, PMH, social hx, family hx, and changes were documented in the history of present illness. Otherwise, unchanged from my initial visit note.  Past Medical History  Diagnosis Date  . Diabetes mellitus     boarderline  . BPH (benign prostatic hypertrophy)   . Neuropathy ([employer])   . OSA on CPAP     cpap- doe snot know settings   . Arthritis   . Sinus infection     dx 12/13   . Blister of foot or toe  left ssecond toe healing per note of 09/21/12   . Heart murmur     noted on visit of 09/20/12- EPIC   . COPD (chronic obstructive pulmonary disease) ([place])   . Shortness of breath dyspnea   . Pneumonia     hx. of  . Ventral hernia 2013  . Descending colostomy s/p reversal 09/27/12 09/28/2012  . Diabetic foot ulcer ([place]) 09/22/2015  . Diverticulitis of colon with perforation 12/24/2011   Past Surgical History  Procedure Laterality Date  . Cholecystectomy    . Laparotomy  12/23/2011    Procedure: EXPLORATORY LAPAROTOMY;  Surgeon: Odis Hollingshead, MD;  [facility];  Service: General;  Laterality: N/A;  . Appendectomy  12/23/2011    Procedure: APPENDECTOMY;  Surgeon: Odis Hollingshead, MD;   [facility];  Service: General;  Laterality: N/A;  . Colostomy revision  12/23/2011    Procedure: COLON RESECTION SIGMOID;  Surgeon: Odis Hollingshead, MD;  [facility];  Service: General;  Laterality: N/AJeanette Caprice procedure    . Cardiac catheterization      [facility]  . Bilateral hand surgery       metal placed in hands due to trauma in 2011   . Right shoulder surgery       related to trauma   . Colostomy takedown  09/27/2012    Procedure: LAPAROSCOPIC COLOSTOMY TAKEDOWN;  Surgeon: Odis Hollingshead, MD;  [facility];  Service: General;  Laterality: N/A;  Laparoscopic Colostomy Takedown  . Incise and drain abcess      sebaceous cyst lanced in office on lt shoulder  . Ventral hernia repair N/A 11/19/2015    Procedure: LAPAROSCOPIC LYSIS OF ADHSIONS, VENTRAL HERNIA REPAIR WITH MESH;  Surgeon: Jackolyn Confer, MD;  [facility];  Service: General;  Laterality: N/A;  . Insertion of mesh N/A 11/19/2015    Procedure: INSERTION OF MESH;  Surgeon: Jackolyn Confer, MD;  [facility];  Service: General;  Laterality: N/A;   Social History   Social History  . Marital Status: Married    Spouse Name: N/A  . Number of Children: 5   Occupational History  . Futures trader    Social History Main Topics  . Smoking status: Former Smoker -- 2.00 packs/day for 40 years    Types: Cigarettes    Quit date: 2008  . Smokeless tobacco: Former Systems developer  . Alcohol Use: No  . Drug Use: No   Current Outpatient Prescriptions on File Prior to Visit  Medication Sig Dispense Refill  . albuterol (PROVENTIL HFA;VENTOLIN HFA) 108 (90 BASE) MCG/ACT inhaler Inhale 2 puffs into the lungs every 6 (six) hours as needed for wheezing or shortness of breath. 1 Inhaler 11  . Blood Glucose Monitoring Suppl (ONE TOUCH ULTRA SYSTEM KIT) W/DEVICE KIT 1 kit by Does not apply route once. Use as directed to test blood sugar once daily  Dx:E11.49 1 each 0  . fluticasone (FLONASE) 50 MCG/ACT nasal spray Place  2 sprays into both nostrils daily. 16 g 6  . gabapentin (NEURONTIN) 400 MG capsule TAKE ONE CAPSULE BY MOUTH 4 TIMES A DAY 120 capsule 5  . glipiZIDE (GLUCOTROL XL) 5 MG 24 hr tablet Take 1 tablet (5 mg total) by mouth daily with breakfast. 90 tablet 1  . glucose blood (ONE TOUCH ULTRA TEST) test strip Use 2x a day 200 each 11  . Lancets (ONETOUCH ULTRASOFT) lancets Use as instructed 100 each 3  . metFORMIN (GLUCOPHAGE) 500 MG  tablet TAKE 1 TABLET BY MOUTH 2 TIMES DAILY WITH A MEAL. 180 tablet 1  . naproxen sodium (ANAPROX) 220 MG tablet Take 220 mg by mouth 2 (two) times daily as needed. For pain    . Tamsulosin HCl (FLOMAX) 0.4 MG CAPS Take 0.8 mg by mouth at bedtime.     . Tiotropium Bromide-Olodaterol (STIOLTO RESPIMAT) 2.5-2.5 MCG/ACT AERS Inhale 2 puffs into the lungs daily. 4 g 6  . traMADol (ULTRAM) 50 MG tablet TAKE 1 TABLET BY MOUTH EVERY 6 HOURS AS NEEDED FOR PAIN 180 tablet 0  . Umeclidinium-Vilanterol 62.5-25 MCG/INH AEPB Inhale 1 puff into the lungs daily. 60 each 11  . [DISCONTINUED] hydrochlorothiazide (MICROZIDE) 12.5 MG capsule Take 1 capsule (12.5 mg total) by mouth daily. 30 capsule 0   No current facility-administered medications on file prior to visit.   Allergies  Allergen Reactions  . Sudafed [Pseudoephedrine Hcl]     "makes me feel drunk"   Family History  Problem Relation Age of Onset  . Diabetes Mother   . Cancer Mother     skin  . Heart disease Mother   . Heart attack Mother   . Cancer Father     Leukemia  . Colon cancer Neg Hx    PE: BP 128/66 mmHg  Pulse 82  Ht 6' (1.829 m)  Wt 254 lb 9.6 oz (115.486 kg)  BMI 34.52 kg/m2  SpO2 94% Wt Readings from Last 3 Encounters:  04/07/16 254 lb 9.6 oz (115.486 kg)  01/18/16 244 lb 12 oz (111.018 kg)  12/08/15 241 lb (109.317 kg)   Constitutional: overweight, in NAD Eyes: PERRLA, EOMI, no exophthalmos ENT: moist mucous membranes, no thyromegaly, no cervical lymphadenopathy Cardiovascular: RRR, + 1/6 SEM,  no RG, no LE edema Respiratory: CTA B Gastrointestinal: abdomen soft, NT, ND, BS+ Musculoskeletal: no deformities, strength intact in all 4 Skin: moist, warm, no rashes Neurological: + mild tremor with outstretched hands, DTR normal in all 4  ASSESSMENT: 1. DM2, non-insulin-dependent, uncontrolled, with complications - diabetic peripheral neuropathy  2. PN  PLAN:  1. Patient with long-standing, uncontrolled diabetes, with good control on Metformin 500 mg twice a day + Glipizide ER 5 mg daily. This regimen usually works greatly for him, will continue it as sugars are still great. However, sugars are higher before dinner as he is eating protein bars in the pm >> advised to stop but will not change regimen now, but move Glipizide to before b'fast as he is now taking it after b'fast. - last Hba1c was 6.1% >> today: 6.2% - I advised him to increase Glipizide ER if he needs to take steroids again. - I suggested to:  Patient Instructions  Please continue: - Metformin 500 mg 2x a day, with meals. - Glipizide ER 5 mg before breakfast.  If you need to start steroids again, you may need to take 2 tablets of Glipizide ER in am.  Please start Neuropathy cream: Amitriptyline 2%, Lidocaine 2%, and Ketamine 1% - apply on feet 2x a day  Please come back for a follow-up appointment in 3 months.   - Continue checking sugars at different times of the day - check 1-2 times a day, rotating checks - advised for yearly eye exams >> he is UTD - will need a lipid panel at next visit - Return to clinic in 3 mo with sugar log  2. PN - will add: Neuropathy cream: mix Amitriptyline 2%, Lidocaine 2%, and Ketamine 1% - apply on feet 2x  a day - may need referral to neurology  - Lyrica 300$ per mo >> could not afford - continue Neurontin, tramadol

## 2016-04-07 NOTE — Patient Instructions (Addendum)
Patient Instructions  Please continue: - Metformin 500 mg 2x a day, with meals. - Glipizide ER 5 mg before breakfast.  If you need to start steroids again, you may need to take 2 tablets of Glipizide ER in am.  Please start Neuropathy cream: Amitriptyline 2%, Lidocaine 2%, and Ketamine 1% - apply on feet 2x a day  Please come back for a follow-up appointment in 3 months.

## 2016-04-12 ENCOUNTER — Telehealth: Payer: Self-pay | Admitting: Family Medicine

## 2016-04-13 NOTE — Telephone Encounter (Signed)
Last f/u 01/2016 

## 2016-04-13 NOTE — Telephone Encounter (Signed)
Rx called in to requested pharmacy 

## 2016-04-15 NOTE — Telephone Encounter (Addendum)
[facility] [place] said pt requesting to go back to quantity # 180 instead of # 100 for tramadol. The reference to the # 80 given  For rx # [id number] took place back in May 2017. Pt did not pick up # 100 today and wants cb to [facility] [place] on 04/18/16.

## 2016-04-15 NOTE — Telephone Encounter (Signed)
Ok to change quantity as requested.

## 2016-04-18 NOTE — Telephone Encounter (Signed)
Rx called in to requested pharmacy 

## 2016-05-08 ENCOUNTER — Telehealth: Payer: Self-pay | Admitting: Family Medicine

## 2016-05-11 NOTE — Telephone Encounter (Signed)
Spoke to pts wife and discussed #120. States that she is agreeable and is aware that that will be a one month supply

## 2016-05-11 NOTE — Telephone Encounter (Signed)
Mrs Brannon left v/m requesting cb about call pt received earlier today.

## 2016-05-11 NOTE — Telephone Encounter (Signed)
Pt's wife left v/m requesting status of refill for # 180 on tramadol to [facility] [place]. Mrs Sarantos request cb when refilled for # 180. Have not changed quantity from # 100 to # 180. Per hx med list pt usually gets # 180. Last printed # 100 on 04/13/16. Last seen 09/22/2015.

## 2016-05-11 NOTE — Telephone Encounter (Signed)
Ok to refill for quantity requested.

## 2016-05-11 NOTE — Telephone Encounter (Signed)
Spoke to Dr Deborra Medina regarding pts Rx. Pt has been getting #180 for years, with instruction 1tab q6H. Pt has been receiving more than allotted for dosing and from this point forward, per Dr Deborra Medina, pt will only receive #120/mo. Spoke to pt and advised. Rx called in to requested pharmacy

## 2016-06-06 ENCOUNTER — Telehealth: Payer: Self-pay | Admitting: Internal Medicine

## 2016-06-06 ENCOUNTER — Other Ambulatory Visit: Payer: Self-pay

## 2016-06-06 MED ORDER — GLUCOSE BLOOD VI STRP: ORAL_STRIP | 11 refills | Status: DC

## 2016-06-06 NOTE — Telephone Encounter (Signed)
Patient need a refill of diabetic supplies Strips for the one touch ultra.  [id number] [place], [place] [phone] (Phone) [phone] (Fax)

## 2016-06-08 ENCOUNTER — Other Ambulatory Visit: Payer: Self-pay | Admitting: Family Medicine

## 2016-06-10 NOTE — Telephone Encounter (Signed)
Last f/u 01/2016-CPE 

## 2016-06-10 NOTE — Telephone Encounter (Signed)
Rx called in to requested pharmacy 

## 2016-06-13 ENCOUNTER — Other Ambulatory Visit: Payer: Self-pay | Admitting: Internal Medicine

## 2016-06-17 DIAGNOSIS — N401 Enlarged prostate with lower urinary tract symptoms: Secondary | ICD-10-CM | POA: Diagnosis not present

## 2016-06-21 DIAGNOSIS — N401 Enlarged prostate with lower urinary tract symptoms: Secondary | ICD-10-CM | POA: Diagnosis not present

## 2016-06-21 DIAGNOSIS — R3911 Hesitancy of micturition: Secondary | ICD-10-CM | POA: Diagnosis not present

## 2016-06-21 DIAGNOSIS — R351 Nocturia: Secondary | ICD-10-CM | POA: Diagnosis not present

## 2016-06-29 ENCOUNTER — Other Ambulatory Visit: Payer: Self-pay | Admitting: Family Medicine

## 2016-07-03 ENCOUNTER — Other Ambulatory Visit: Payer: Self-pay | Admitting: Internal Medicine

## 2016-07-04 ENCOUNTER — Other Ambulatory Visit: Payer: Self-pay | Admitting: Family Medicine

## 2016-07-08 ENCOUNTER — Ambulatory Visit (INDEPENDENT_AMBULATORY_CARE_PROVIDER_SITE_OTHER): Payer: Medicare Other | Admitting: Internal Medicine

## 2016-07-08 ENCOUNTER — Encounter: Payer: Self-pay | Admitting: Internal Medicine

## 2016-07-08 VITALS — BP 131/68 | HR 77 | Ht 72.0 in | Wt 263.0 lb

## 2016-07-08 DIAGNOSIS — E1140 Type 2 diabetes mellitus with diabetic neuropathy, unspecified: Secondary | ICD-10-CM

## 2016-07-08 DIAGNOSIS — E669 Obesity, unspecified: Secondary | ICD-10-CM

## 2016-07-08 DIAGNOSIS — E1142 Type 2 diabetes mellitus with diabetic polyneuropathy: Secondary | ICD-10-CM

## 2016-07-08 LAB — LIPID PANEL
CHOLESTEROL: 174 mg/dL (ref 0–200)
HDL: 38.2 mg/dL — ABNORMAL LOW (ref >39.00)
NonHDL: 136.05
Total CHOL/HDL Ratio: 5
Triglycerides: 239 mg/dL — ABNORMAL HIGH (ref 0.0–149.0)
VLDL: 47.8 mg/dL — AB (ref 0.0–40.0)

## 2016-07-08 LAB — POCT GLYCOSYLATED HEMOGLOBIN (HGB A1C): Hemoglobin A1C: 6.3

## 2016-07-08 LAB — LDL CHOLESTEROL, DIRECT: LDL DIRECT: 110 mg/dL

## 2016-07-08 MED ORDER — GLIPIZIDE ER 5 MG PO TB24: 10.0000 mg | ORAL_TABLET | Freq: Every day | ORAL | 3 refills | Status: DC

## 2016-07-08 NOTE — Patient Instructions (Addendum)
Please continue: - Metformin 500 mg 2x a day, with meals. - Glipizide ER 10 mg before breakfast.  Please come back for a follow-up appointment in 3 months.

## 2016-07-08 NOTE — Progress Notes (Addendum)
Patient ID: Nicholas [name], male   DOB: [birth year], 75 y.o.   MRN: [id number]  HPI: Nicholas [name] is a 75 y.o.-year-old male, returning for f/u for DM2, dx in 2012-2013, non-insulin-dependent, uncontrolled, with complications (PN, diabetic ulcer). Last visit 3 mo ago.  Last hemoglobin A1c was: Lab Results  Component Value Date   HGBA1C 6.2 04/07/2016   HGBA1C 6.1 (H) 11/16/2015   HGBA1C 6.1 09/07/2015   Pt is on a regimen of: - Metformin - prev. 1000 mg 2x a day >> dizzy, confusion >> now 500 mg 2x a day - Glipizide ER 5 >> 10 mg in am (before b'fast)  He was on Tradjenta 5 mg at night (!) (170$ for 1 mo) - started 05/2015 - takes it at night!  Pt checks his sugars 2x a day: - am: 154-204 >> 122-164, 191 >> 101-128, 138 >> 105-135 >> 127-142 >> 97-133, 147 - 2h after b'fast: n/c - before lunch: n/c >> 119, 232 >> 155, 161 >> 120, 134 >> 140 >> n/c - 2h after lunch: n/c >> 162-182, 247 >> 89-144 >> n/c - before dinner: n/c >> 113-192 >> 106-138 >> 100-142 >> 128, 132-185, 209 (has energy bars before dinner) >> 135-184, 191 - 2h after dinner: n/c >> 128 >> 96 >> n/c - bedtime: 136-284, 301 >> 117-180, 195 >> 124-175 >> 123-191, 204 >> 137-179, 192, 239 >> 132-199, 208 - nighttime: n/c No lows. Lowest sugar was 113 >> 100 >> 70x1 >> 97; he has hypoglycemia awareness at 70.  Highest sugar was 301 (apple pie) >> 247 >> 241 with steroids >> 204 >> 239 >> 208  Glucometer: One Touch Ultra  Pt's meals are: - Breakfast: PB + jelly toast and coffee - Lunch: grilled chicken + bun + fries or veggie plate and tea - Dinner: meat + 2 veggies - Snacks: 4 Stopped sodas.  - + mild CKD, last BUN/creatinine:  Lab Results  Component Value Date   BUN 12 11/20/2015   CREATININE 1.01 11/20/2015   - last set of lipids: Lab Results  Component Value Date   CHOL 174 02/11/2015   HDL 39.30 02/11/2015   LDLCALC 83 03/18/2014   LDLDIRECT 102.0 02/11/2015   TRIG 224.0 (H) 02/11/2015   CHOLHDL 4  02/11/2015  Not on a statin.  - last eye exam was in 03/2016. No DR. + Cataracts. Dr. Idolina Primer. - no numbness and tingling in his feet. On Tramadol, Neurontin 400 mg 3x a day. On compounded neuropathic cream started at last visit >> helps.  He also has a history of OSA, on CPAP.  He developed a L foot Diabetic ulcer early 2017 >> treated with Doxycycline. He then saw wound care >> now healed.    ROS: Constitutional: + weight gain, no fatigue, no subjective hyperthermia/hypothermia Eyes: no blurry vision, no xerophthalmia ENT: no sore throat, no nodules palpated in throat, no dysphagia/odynophagia, no hoarseness Cardiovascular: no CP/SOB/palpitations/leg swelling Respiratory: no cough/SOB Gastrointestinal: no N/V/D/C Musculoskeletal: no muscle/joint aches Skin: no rashes Neurological: no tremors/+ numbness/+ tingling/dizziness  I reviewed pt's medications, allergies, PMH, social hx, family hx, and changes were documented in the history of present illness. Otherwise, unchanged from my initial visit note.  Past Medical History:  Diagnosis Date  . Arthritis   . Blister of foot or toe    left ssecond toe healing per note of 09/21/12   . BPH (benign prostatic hypertrophy)   . COPD (chronic obstructive pulmonary disease) ([place])   .  Descending colostomy s/p reversal 09/27/12 09/28/2012  . Diabetes mellitus    boarderline  . Diabetic foot ulcer ([address]) 09/22/2015  . Diverticulitis of colon with perforation 12/24/2011  . Heart murmur    noted on visit of 09/20/12- [facility]   . Neuropathy ([place])   . OSA on CPAP    cpap- doe snot know settings   . Pneumonia    hx. of  . Shortness of breath dyspnea   . Sinus infection    dx 12/13   . Ventral hernia 2013   Past Surgical History:  Procedure Laterality Date  . APPENDECTOMY  12/23/2011   Procedure: APPENDECTOMY;  Surgeon: Odis Hollingshead, MD;  Location: [facility];  Service: General;  Laterality: N/A;  . bilateral hand surgery      metal placed in  hands due to trauma in 2011   . CARDIAC CATHETERIZATION     [facility]  . CHOLECYSTECTOMY    . COLOSTOMY REVISION  12/23/2011   Procedure: COLON RESECTION SIGMOID;  Surgeon: Odis Hollingshead, MD;  Location: [facility];  Service: General;  Laterality: N/A;  . COLOSTOMY TAKEDOWN  09/27/2012   Procedure: LAPAROSCOPIC COLOSTOMY TAKEDOWN;  Surgeon: Odis Hollingshead, MD;  Location: [facility];  Service: General;  Laterality: N/A;  Laparoscopic Colostomy Takedown  . hartmann procedure    . INCISE AND DRAIN ABCESS     sebaceous cyst lanced in office on lt shoulder  . INSERTION OF MESH N/A 11/19/2015   Procedure: INSERTION OF MESH;  Surgeon: Jackolyn Confer, MD;  Location: [facility];  Service: General;  Laterality: N/A;  . LAPAROTOMY  12/23/2011   Procedure: EXPLORATORY LAPAROTOMY;  Surgeon: Odis Hollingshead, MD;  Location: [facility];  Service: General;  Laterality: N/A;  . right shoulder surgery      related to trauma   . VENTRAL HERNIA REPAIR N/A 11/19/2015   Procedure: LAPAROSCOPIC LYSIS OF ADHSIONS, VENTRAL HERNIA REPAIR WITH MESH;  Surgeon: Jackolyn Confer, MD;  Location: [facility];  Service: General;  Laterality: N/A;   Social History   Social History  . Marital Status: Married    Spouse Name: N/A  . Number of Children: 5   Occupational History  . Futures trader    Social History Main Topics  . Smoking status: Former Smoker -- 2.00 packs/day for 40 years    Types: Cigarettes    Quit date: 2008  . Smokeless tobacco: Former Systems developer  . Alcohol Use: No  . Drug Use: No   Current Outpatient Prescriptions on File Prior to Visit  Medication Sig Dispense Refill  . Blood Glucose Monitoring Suppl (ONE TOUCH ULTRA SYSTEM KIT) W/DEVICE KIT 1 kit by Does not apply route once. Use as directed to test blood sugar once daily  Dx:E11.49 1 each 0  . fluticasone (FLONASE) 50 MCG/ACT nasal spray PLACE 2 SPRAYS INTO BOTH NOSTRILS DAILY. 16 g 4  . gabapentin (NEURONTIN) 400 MG capsule TAKE ONE CAPSULE BY MOUTH 4 TIMES A  DAY 120 capsule 5  . glipiZIDE (GLUCOTROL XL) 5 MG 24 hr tablet TAKE 1 TABLET BY MOUTH DAILY WITH BREAKFAST. 90 tablet 1  . glucose blood (ONE TOUCH ULTRA TEST) test strip Use 2x a day 200 each 11  . Lancets (ONETOUCH ULTRASOFT) lancets Use as instructed 100 each 3  . metFORMIN (GLUCOPHAGE) 500 MG tablet TAKE 1 TABLET BY MOUTH 2 TIMES DAILY WITH A MEAL. 180 tablet 1  . naproxen sodium (ANAPROX) 220 MG tablet Take 220 mg by mouth 2 (two)  times daily as needed. For pain    . NONFORMULARY OR COMPOUNDED ITEM Neuropathy cream: mix Amitriptyline 2%, Lidocaine 2%, and Ketamine 1% - apply on feet 2x a day 1 each 1  . Tamsulosin HCl (FLOMAX) 0.4 MG CAPS Take 0.8 mg by mouth at bedtime.     . Tiotropium Bromide-Olodaterol (STIOLTO RESPIMAT) 2.5-2.5 MCG/ACT AERS Inhale 2 puffs into the lungs daily. 4 g 6  . traMADol (ULTRAM) 50 MG tablet TAKE 1 TABLET BY MOUTH EVERY 6 HOURS AS NEEDED FOR PAIN 120 tablet 0  . Umeclidinium-Vilanterol 62.5-25 MCG/INH AEPB Inhale 1 puff into the lungs daily. 60 each 11  . albuterol (PROVENTIL HFA;VENTOLIN HFA) 108 (90 BASE) MCG/ACT inhaler Inhale 2 puffs into the lungs every 6 (six) hours as needed for wheezing or shortness of breath. (Patient not taking: Reported on 07/08/2016) 1 Inhaler 11  . [DISCONTINUED] hydrochlorothiazide (MICROZIDE) 12.5 MG capsule Take 1 capsule (12.5 mg total) by mouth daily. 30 capsule 0   No current facility-administered medications on file prior to visit.    Allergies  Allergen Reactions  . Sudafed [Pseudoephedrine Hcl]     "makes me feel drunk"   Family History  Problem Relation Age of Onset  . Diabetes Mother   . Cancer Mother     skin  . Heart disease Mother   . Heart attack Mother   . Cancer Father     Leukemia  . Colon cancer Neg Hx    PE: BP 131/68   Pulse 77   Ht 6' (1.829 m)   Wt 263 lb (119.3 kg)   BMI 35.67 kg/m  Wt Readings from Last 3 Encounters:  07/08/16 263 lb (119.3 kg)  04/07/16 254 lb 9.6 oz (115.5 kg)   01/18/16 244 lb 12 oz (111 kg)   Constitutional: overweight, in NAD Eyes: PERRLA, EOMI, no exophthalmos ENT: moist mucous membranes, no thyromegaly, no cervical lymphadenopathy Cardiovascular: RRR, + 1/6 SEM, no RG, + B LE edema Respiratory: CTA B Gastrointestinal: abdomen soft, NT, ND, BS+ Musculoskeletal: no deformities, strength intact in all 4 Skin: moist, warm, no rashes Neurological: + mild tremor with outstretched hands, DTR normal in all 4  ASSESSMENT: 1. DM2, non-insulin-dependent, uncontrolled, with complications - diabetic peripheral neuropathy  2. PN  3. Obesity  PLAN:  1. Patient with long-standing, uncontrolled diabetes, with good control on Metformin 500 mg twice a day + Glipizide ER 10 mg daily.  Sugars are great in am >> increase before dinner (snacks in the pm) >> advised to cut these out. We also discussed about healthier snacks. No change in regimen for now. - last Hba1c was 6.1% >> today: 6.3% - I suggested to:  Patient Instructions  Please continue: - Metformin 500 mg 2x a day, with meals. - Glipizide ER 10 mg before breakfast.  Please come back for a follow-up appointment in 3 months.   - Continue checking sugars at different times of the day - check 1-2 times a day, rotating checks - advised for yearly eye exams >> he is UTD - will need a lipid panel now - Return to clinic in 3 mo with sugar log  2. PN - we added at last visit: Neuropathy cream: mix Amitriptyline 2%, Lidocaine 2%, and Ketamine 1% - apply on feet 2x a day >> feeling better - Lyrica 300$ per mo >> could not afford - continue Neurontin, tramadol  3. Obesity - he gained 20 lbs in last 5 mo! >> fatigue, SOB - Strongly advised him  to start working on diet and exercise.   Component     Latest Ref Rng & Units 07/08/2016  Cholesterol     0 - 200 mg/dL 174  Triglycerides     0.0 - 149.0 mg/dL 239.0 (H)  HDL Cholesterol     >39.00 mg/dL 38.20 (L)  VLDL     0.0 - 40.0 mg/dL 47.8 (H)   Total CHOL/HDL Ratio      5  NonHDL      136.05  Hemoglobin A1C      6.3  Direct LDL     mg/dL 110.0   As expected, Lipid panel is worse.  Philemon Kingdom, MD PhD [facility] Endocrinology

## 2016-07-08 NOTE — Addendum Note (Signed)
Addended by: Linus Galas L on: 07/08/2016 05:00 PM   Modules accepted: Orders

## 2016-07-12 ENCOUNTER — Other Ambulatory Visit: Payer: Self-pay | Admitting: Family Medicine

## 2016-07-12 DIAGNOSIS — J209 Acute bronchitis, unspecified: Secondary | ICD-10-CM | POA: Diagnosis not present

## 2016-07-12 DIAGNOSIS — J440 Chronic obstructive pulmonary disease with acute lower respiratory infection: Secondary | ICD-10-CM | POA: Diagnosis not present

## 2016-07-12 NOTE — Telephone Encounter (Signed)
Last f/u 06/2016

## 2016-07-12 NOTE — Telephone Encounter (Signed)
Rx called in to requested pharmacy 

## 2016-07-21 ENCOUNTER — Other Ambulatory Visit: Payer: Self-pay | Admitting: Internal Medicine

## 2016-07-21 MED ORDER — NONFORMULARY OR COMPOUNDED ITEM: 5 refills | Status: DC

## 2016-07-22 ENCOUNTER — Other Ambulatory Visit: Payer: Self-pay

## 2016-07-22 ENCOUNTER — Other Ambulatory Visit: Payer: Self-pay | Admitting: Internal Medicine

## 2016-07-22 MED ORDER — NONFORMULARY OR COMPOUNDED ITEM: 5 refills | Status: DC

## 2016-07-25 MED ORDER — NONFORMULARY OR COMPOUNDED ITEM: 5 refills | Status: DC

## 2016-07-28 DIAGNOSIS — E119 Type 2 diabetes mellitus without complications: Secondary | ICD-10-CM | POA: Diagnosis not present

## 2016-07-28 DIAGNOSIS — H25013 Cortical age-related cataract, bilateral: Secondary | ICD-10-CM | POA: Diagnosis not present

## 2016-07-28 DIAGNOSIS — H2513 Age-related nuclear cataract, bilateral: Secondary | ICD-10-CM | POA: Diagnosis not present

## 2016-07-28 LAB — HM DIABETES EYE EXAM

## 2016-08-11 ENCOUNTER — Other Ambulatory Visit: Payer: Self-pay | Admitting: Internal Medicine

## 2016-08-11 IMAGING — CR DG CHEST 2V
2 series · 2 of 2 positions shown · non-contrast
Comparison: 12/23/2011.

CLINICAL DATA: Cough.  Shortness of breath.

EXAM:
CHEST  2 VIEW

[view not recorded (1 of 2)]
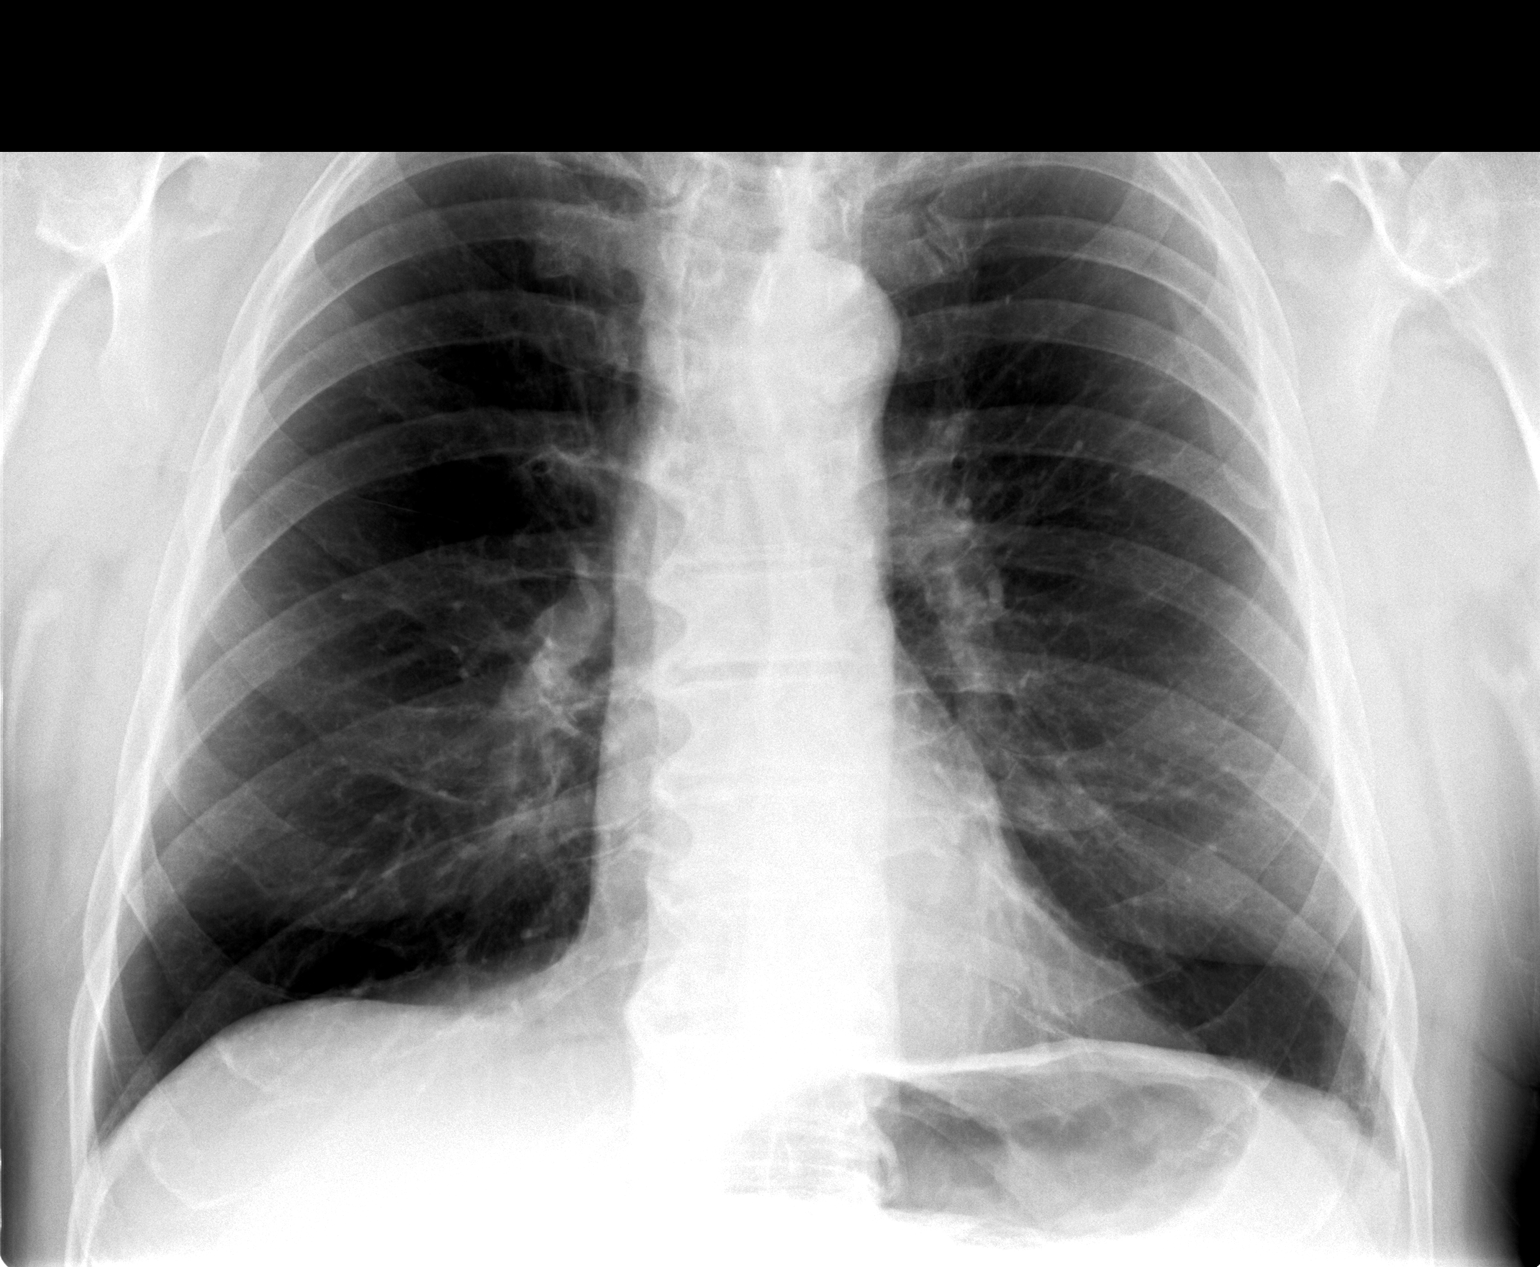

[view not recorded (2 of 2)]
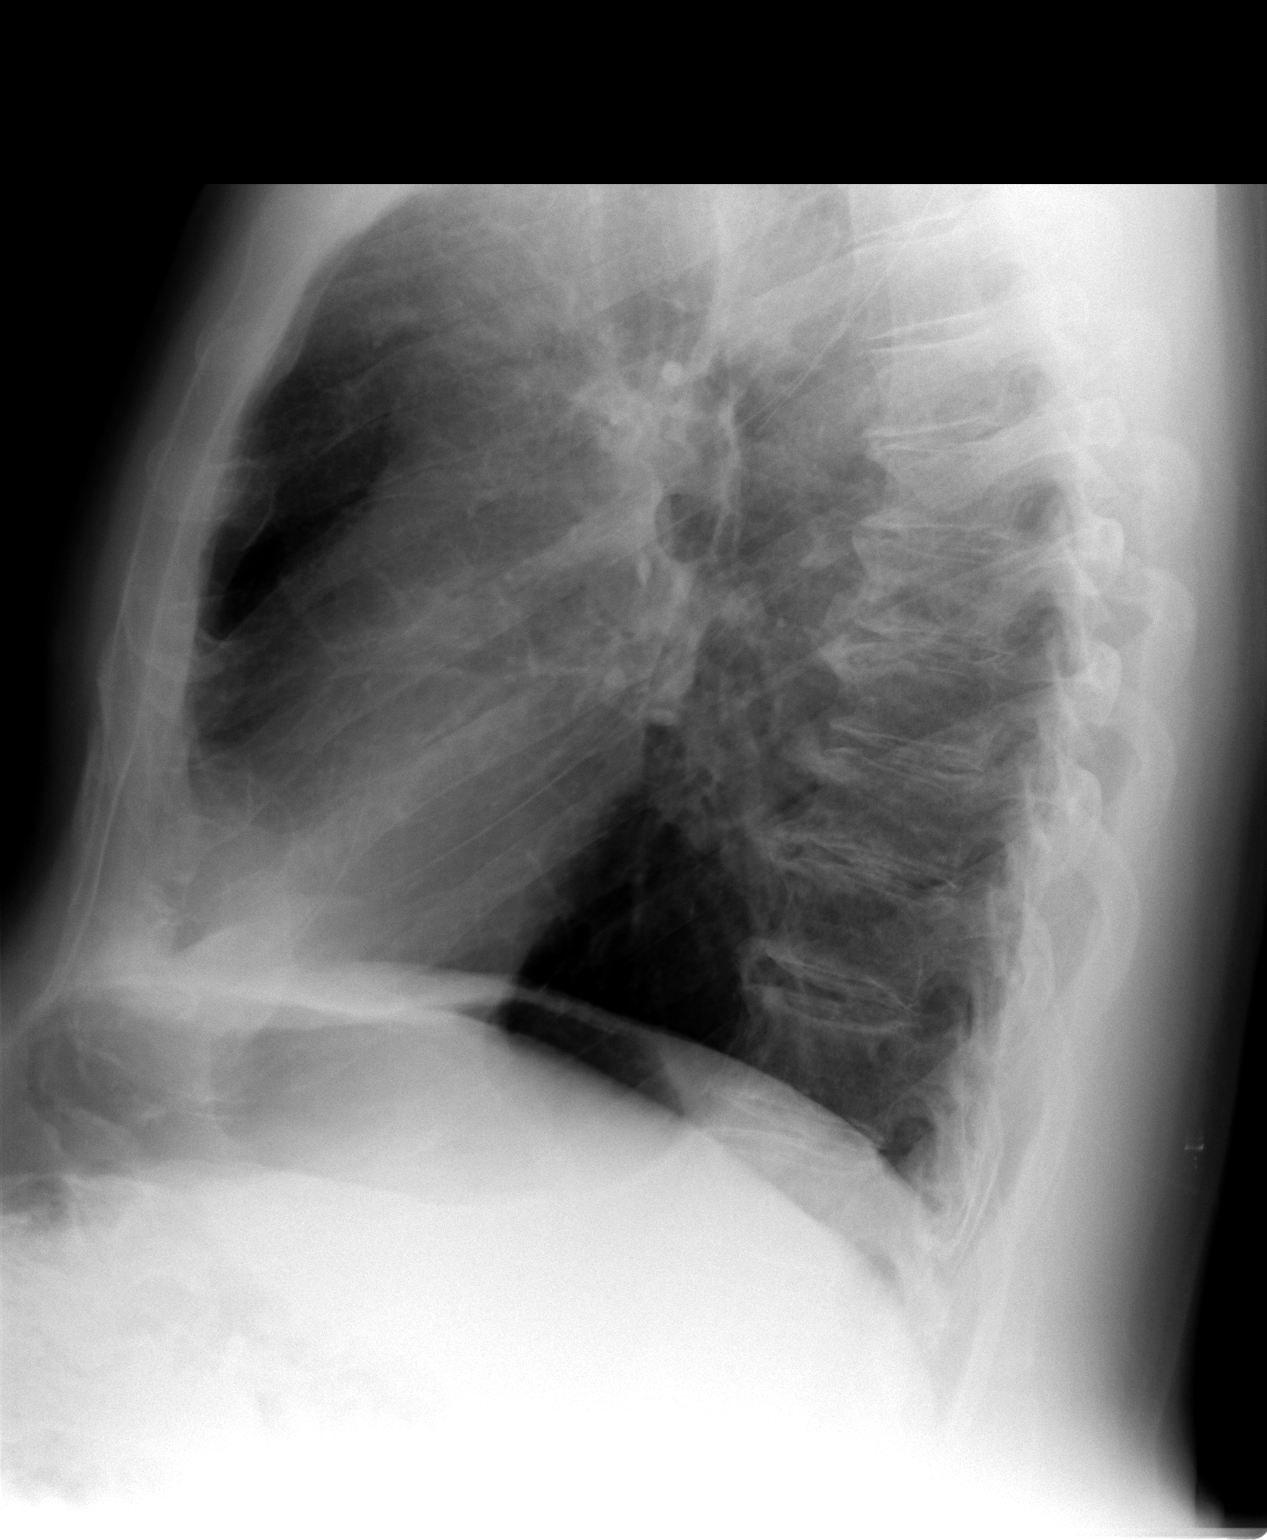

[2 of 2 positions shown; findings below may reference images not displayed]

FINDINGS: Mediastinum and hilar structures are normal. Lungs are clear. Heart
size normal. No pleural effusion or pneumothorax. Biapical pleural
parenchymal thickening noted most consistent with scarring. Heart
size normal. Degenerative changes thoracic spine.
IMPRESSION: Biapical pleural parenchymal thickening most consistent with
scarring. No acute cardiopulmonary disease.

## 2016-08-15 ENCOUNTER — Other Ambulatory Visit: Payer: Self-pay | Admitting: Family Medicine

## 2016-08-16 NOTE — Telephone Encounter (Signed)
Rx called in to requested pharmacy 

## 2016-08-16 NOTE — Telephone Encounter (Signed)
Last f/u 06/2016

## 2016-08-18 ENCOUNTER — Telehealth: Payer: Self-pay | Admitting: Internal Medicine

## 2016-08-18 DIAGNOSIS — H10413 Chronic giant papillary conjunctivitis, bilateral: Secondary | ICD-10-CM | POA: Diagnosis not present

## 2016-08-18 NOTE — Telephone Encounter (Signed)
lmtcb x1 for pt's wife. Pt has not been seen over 1 year.

## 2016-08-19 NOTE — Telephone Encounter (Signed)
Spoke with pt's wife. Advised her that we can't refill his medication at this time. Pt will need an appointment. Pt's wife verbalized understanding and asked that an appointment be scheduled. OV has been scheduled for 08/23/16 at 4pm. Nothing further was needed.

## 2016-08-23 ENCOUNTER — Encounter: Payer: Self-pay | Admitting: Adult Health

## 2016-08-23 ENCOUNTER — Ambulatory Visit (INDEPENDENT_AMBULATORY_CARE_PROVIDER_SITE_OTHER): Payer: Medicare Other | Admitting: Adult Health

## 2016-08-23 ENCOUNTER — Ambulatory Visit (INDEPENDENT_AMBULATORY_CARE_PROVIDER_SITE_OTHER)
Admission: RE | Admit: 2016-08-23 | Discharge: 2016-08-23 | Disposition: A | Discharge status: Home / Self Care | Payer: Medicare Other | Source: Ambulatory Visit | Attending: Adult Health | Admitting: Adult Health

## 2016-08-23 VITALS — BP 122/60 | HR 77 | Temp 98.0°F | Ht 72.0 in | Wt 258.0 lb

## 2016-08-23 DIAGNOSIS — J432 Centrilobular emphysema: Secondary | ICD-10-CM

## 2016-08-23 DIAGNOSIS — J449 Chronic obstructive pulmonary disease, unspecified: Secondary | ICD-10-CM | POA: Diagnosis not present

## 2016-08-23 NOTE — Patient Instructions (Signed)
Restart ANORO  1 puff daily .  Chest xray today .  Continue on CPAP At bedtime   follow up Dr. Annamaria Boots  In 6 months and As needed

## 2016-08-23 NOTE — Progress Notes (Signed)
Subjective:    Patient ID: Nicholas [name], male    DOB: [birth year], 75 y.o.   MRN: [id number]  HPI 75 year old male former smoker with known COPD  TEST  Spirometry 10/02/14- Moderate obstruction, FEV1 55%, FVC 67%.  NPSG 2007, AHI 40/ hr  Advanced   08/23/2016 Follow up : COPD /OSA  Patient presents for a one-year follow-up. Pt says he is doing well on CPAP . Feels rested. No mask issues .   Has been on ANORO in past . We discussed restarting to see if this will help his breathing some.  Does get winded with walking.  No flare of cough or wheezing . No chest pain or orthopnea.  PVX and Prevnar are utd.     Past Medical History:  Diagnosis Date  . Arthritis   . Blister of foot or toe    left ssecond toe healing per note of 09/21/12   . BPH (benign prostatic hypertrophy)   . COPD (chronic obstructive pulmonary disease) ([place])   . Descending colostomy s/p reversal 09/27/12 09/28/2012  . Diabetes mellitus    boarderline  . Diabetic foot ulcer ([place]) 09/22/2015  . Diverticulitis of colon with perforation 12/24/2011  . Heart murmur    noted on visit of 09/20/12- [facility]   . Neuropathy ([place])   . OSA on CPAP    cpap- doe snot know settings   . Pneumonia    hx. of  . Shortness of breath dyspnea   . Sinus infection    dx 12/13   . Ventral hernia 2013   Current Outpatient Prescriptions on File Prior to Visit  Medication Sig Dispense Refill  . albuterol (PROVENTIL HFA;VENTOLIN HFA) 108 (90 BASE) MCG/ACT inhaler Inhale 2 puffs into the lungs every 6 (six) hours as needed for wheezing or shortness of breath. 1 Inhaler 11  . Blood Glucose Monitoring Suppl (ONE TOUCH ULTRA SYSTEM KIT) W/DEVICE KIT 1 kit by Does not apply route once. Use as directed to test blood sugar once daily  Dx:E11.49 1 each 0  . fluticasone (FLONASE) 50 MCG/ACT nasal spray PLACE 2 SPRAYS INTO BOTH NOSTRILS DAILY. 16 g 4  . gabapentin (NEURONTIN) 400 MG capsule TAKE ONE CAPSULE BY MOUTH 4 TIMES A DAY 120 capsule 5    . glipiZIDE (GLUCOTROL XL) 5 MG 24 hr tablet Take 2 tablets (10 mg total) by mouth daily with breakfast. 180 tablet 3  . glucose blood (ONE TOUCH ULTRA TEST) test strip Use 2x a day 200 each 11  . Lancets (ONETOUCH ULTRASOFT) lancets Use as instructed 100 each 3  . metFORMIN (GLUCOPHAGE) 500 MG tablet TAKE 1 TABLET BY MOUTH 2 TIMES DAILY WITH A MEAL. 180 tablet 1  . naproxen sodium (ANAPROX) 220 MG tablet Take 220 mg by mouth 2 (two) times daily as needed. For pain    . NONFORMULARY OR COMPOUNDED ITEM Neuropathy cream: mix Amitriptyline 2%, Lidocaine 2%, and Ketamine 1% - apply on feet 2x a day - 60 g container 1 each 5  . Tamsulosin HCl (FLOMAX) 0.4 MG CAPS Take 0.8 mg by mouth at bedtime.     . traMADol (ULTRAM) 50 MG tablet TAKE 1 TABLET BY MOUTH EVERY 6 HOURS AS NEEDED FOR PAIN 120 tablet 0  . Umeclidinium-Vilanterol 62.5-25 MCG/INH AEPB Inhale 1 puff into the lungs daily. 60 each 11  . [DISCONTINUED] hydrochlorothiazide (MICROZIDE) 12.5 MG capsule Take 1 capsule (12.5 mg total) by mouth daily. 30 capsule 0   No current facility-administered medications on  file prior to visit.       Review of Systems Constitutional:   No  weight loss, night sweats,  Fevers, chills,  +fatigue, or  lassitude.  HEENT:   No headaches,  Difficulty swallowing,  Tooth/dental problems, or  Sore throat,                No sneezing, itching, ear ache, nasal congestion, post nasal drip,   CV:  No chest pain,  Orthopnea, PND, swelling in lower extremities, anasarca, dizziness, palpitations, syncope.   GI  No heartburn, indigestion, abdominal pain, nausea, vomiting, diarrhea, change in bowel habits, loss of appetite, bloody stools.   Resp:  .  No chest wall deformity  Skin: no rash or lesions.  GU: no dysuria, change in color of urine, no urgency or frequency.  No flank pain, no hematuria   MS:  No joint pain or swelling.  No decreased range of motion.  No back pain.  Psych:  No change in mood or affect.  No depression or anxiety.  No memory loss.         Objective:   Physical Exam.  Vitals:   08/23/16 1607  BP: 122/60  Pulse: 77  Temp: 98 F (36.7 C)  TempSrc: Oral  SpO2: 95%  Weight: 258 lb (117 kg)  Height: 6' (1.829 m)  Body mass index is 34.99 kg/m.   GEN: A/Ox3; pleasant , NAD, obese    HEENT:  [place]/AT,  EACs-clear, TMs-wnl, NOSE-clear, THROAT-clear, no lesions, no postnasal drip or exudate noted.   NECK:  Supple w/ fair ROM; no JVD; normal carotid impulses w/o bruits; no thyromegaly or nodules palpated; no lymphadenopathy.    RESP  Clear  P & A; w/o, wheezes/ rales/ or rhonchi. no accessory muscle use, no dullness to percussion  CARD:  RRR, no m/r/g  , no peripheral edema, pulses intact, no cyanosis or clubbing.  GI:   Soft & nt; nml bowel sounds; no organomegaly or masses detected.   Musco: Warm bil, no deformities or joint swelling noted.   Neuro: alert, no focal deficits noted.    Skin: Warm, no lesions or rashes  Janki Dike NP-C  [facility]       Assessment & Plan:

## 2016-09-08 DIAGNOSIS — H10413 Chronic giant papillary conjunctivitis, bilateral: Secondary | ICD-10-CM | POA: Diagnosis not present

## 2016-09-08 DIAGNOSIS — H02054 Trichiasis without entropian left upper eyelid: Secondary | ICD-10-CM | POA: Diagnosis not present

## 2016-09-13 ENCOUNTER — Telehealth: Payer: Self-pay | Admitting: Internal Medicine

## 2016-09-13 MED ORDER — UMECLIDINIUM-VILANTEROL 62.5-25 MCG/INH IN AEPB: 1.0000 | INHALATION_SPRAY | Freq: Every day | RESPIRATORY_TRACT | 5 refills | Status: DC

## 2016-09-13 NOTE — Telephone Encounter (Signed)
Called and spoke with pt and he is aware of refill of the anoro that has been sent to the pharmacy.

## 2016-09-16 DIAGNOSIS — B9689 Other specified bacterial agents as the cause of diseases classified elsewhere: Secondary | ICD-10-CM | POA: Diagnosis not present

## 2016-09-16 DIAGNOSIS — J329 Chronic sinusitis, unspecified: Secondary | ICD-10-CM | POA: Diagnosis not present

## 2016-09-20 ENCOUNTER — Other Ambulatory Visit: Payer: Self-pay | Admitting: Family Medicine

## 2016-09-20 NOTE — Telephone Encounter (Signed)
Rx called in to requested pharmacy 

## 2016-09-20 NOTE — Telephone Encounter (Signed)
Last f/u 06/2016

## 2016-10-10 HISTORY — PX: EYE SURGERY: SHX253

## 2016-10-11 ENCOUNTER — Encounter: Payer: Self-pay | Admitting: Internal Medicine

## 2016-10-11 ENCOUNTER — Ambulatory Visit (INDEPENDENT_AMBULATORY_CARE_PROVIDER_SITE_OTHER): Payer: Medicare Other | Admitting: Internal Medicine

## 2016-10-11 VITALS — BP 124/72 | HR 84 | Ht 71.0 in | Wt 258.0 lb

## 2016-10-11 DIAGNOSIS — E1140 Type 2 diabetes mellitus with diabetic neuropathy, unspecified: Secondary | ICD-10-CM

## 2016-10-11 LAB — POCT GLYCOSYLATED HEMOGLOBIN (HGB A1C): HEMOGLOBIN A1C: 5.9

## 2016-10-11 NOTE — Progress Notes (Signed)
Patient ID: [name], male   DOB: 1940/11/15, 76 y.o.   MRN: [id number]  HPI: Nicholas [name] is a 76 y.o.-year-old male, returning for f/u for DM2, dx in 2012-2013, non-insulin-dependent, uncontrolled, with complications (PN, diabetic ulcer). Last visit 3 mo ago.  He has a URI >> was on ABx >> did not help >> steroids >> sugars are higher.   Last hemoglobin A1c was: Lab Results  Component Value Date   HGBA1C 6.3 07/08/2016   HGBA1C 6.2 04/07/2016   HGBA1C 6.1 (H) 11/16/2015   Pt is on a regimen of: - Metformin - prev. 1000 mg 2x a day >> dizzy, confusion >> 500 mg 2x a day - Glipizide ER 5 >> 10 mg in am (before b'fast)  He was on Tradjenta 5 mg at night (!) (170$ for 1 mo) - started 05/2015 - takes it at night!  Pt checks his sugars 2x a day: - am: 154-204 >> 122-164, 191 >> 101-128, 138 >> 105-135 >> 127-142 >> 97-133, 147 >> 107-132 (on steroid:188-241) - 2h after b'fast: n/c - before lunch: n/c >> 119, 232 >> 155, 161 >> 120, 134 >> 140 >> n/c  - 2h after lunch: n/c >> 162-182, 247 >> 89-144 >> n/c - before dinner: 100-142 >> 128, 132-185, 209 (has energy bars before dinner) >> 135-184, 191 >> 85-160, 171 - 2h after dinner: n/c >> 128 >> 96 >> n/c - bedtime:  124-175 >> 123-191, 204 >> 137-179, 192, 239 >> 132-199, 208 >> 130-203, 243 (steroids) - nighttime: n/c No lows. Lowest sugar was 113 >> 100 >> 70x1 >> 97 >> 85; he has hypoglycemia awareness at 70.  Highest sugar was 243  Glucometer: One Touch Ultra  Pt's meals are: - Breakfast: PB + jelly toast and coffee - Lunch: grilled chicken + bun + fries or veggie plate and tea - Dinner: meat + 2 veggies - Snacks: 4 Stopped sodas.  - + mild CKD, last BUN/creatinine:  Lab Results  Component Value Date   BUN 12 11/20/2015   CREATININE 1.01 11/20/2015   - last set of lipids: Lab Results  Component Value Date   CHOL 174 07/08/2016   HDL 38.20 (L) 07/08/2016   LDLCALC 83 03/18/2014   LDLDIRECT 110.0 07/08/2016   TRIG  239.0 (H) 07/08/2016   CHOLHDL 5 07/08/2016  Not on a statin.  - last eye exam was in 03/2016. No DR. + Cataracts. Dr. Idolina [name]. He will have cataract sx this Spring.  - no numbness and tingling in his feet. On Tramadol, Neurontin 400 mg 3x a day. On compounded neuropathic cream started at last visit >> helps.  He also has a history of OSA, on CPAP.  He developed a L foot Diabetic ulcer early 2017 >> treated with Doxycycline. He then saw wound care >> now healed.    ROS: Constitutional: no weight gain, no fatigue, no subjective hyperthermia/hypothermia Eyes: no blurry vision, no xerophthalmia ENT: no sore throat, no nodules palpated in throat, no dysphagia/odynophagia, no hoarseness Cardiovascular: no CP/SOB/palpitations/leg swelling Respiratory: no cough/SOB Gastrointestinal: no N/V/D/C Musculoskeletal: no muscle/joint aches Skin: no rashes Neurological: no tremors/+ numbness/+ tingling/dizziness  I reviewed pt's medications, allergies, PMH, social hx, family hx, and changes were documented in the history of present illness. Otherwise, unchanged from my initial visit note.  Past Medical History:  Diagnosis Date  . Arthritis   . Blister of foot or toe    left ssecond toe healing per note of 09/21/12   .  BPH (benign prostatic hypertrophy)   . COPD (chronic obstructive pulmonary disease) ([place])   . Descending colostomy s/p reversal 09/27/12 09/28/2012  . Diabetes mellitus    [facility]  . Diabetic foot ulcer ([place]) 09/22/2015  . Diverticulitis of colon with perforation 12/24/2011  . Heart murmur    noted on visit of 09/20/12- [facility]   . Neuropathy ([place])   . OSA on CPAP    cpap- doe snot know settings   . Pneumonia    hx. of  . Shortness of breath dyspnea   . Sinus infection    dx 12/13   . Ventral hernia 2013   Past Surgical History:  Procedure Laterality Date  . APPENDECTOMY  12/23/2011   Procedure: APPENDECTOMY;  Surgeon: Nicholas Hollingshead, MD;  Location: [facility];  Service:  General;  Laterality: N/A;  . bilateral hand surgery      metal placed in hands due to trauma in 2011   . CARDIAC CATHETERIZATION     [facility]  . CHOLECYSTECTOMY    . COLOSTOMY REVISION  12/23/2011   Procedure: COLON RESECTION SIGMOID;  Surgeon: Nicholas Hollingshead, MD;  Location: [facility];  Service: General;  Laterality: N/A;  . COLOSTOMY TAKEDOWN  09/27/2012   Procedure: LAPAROSCOPIC COLOSTOMY TAKEDOWN;  Surgeon: Nicholas Hollingshead, MD;  Location: [facility];  Service: General;  Laterality: N/A;  Laparoscopic Colostomy Takedown  . hartmann procedure    . INCISE AND DRAIN ABCESS     sebaceous cyst lanced in office on lt shoulder  . INSERTION OF MESH N/A 11/19/2015   Procedure: INSERTION OF MESH;  Surgeon: Nicholas Confer, MD;  Location: [facility];  Service: General;  Laterality: N/A;  . LAPAROTOMY  12/23/2011   Procedure: EXPLORATORY LAPAROTOMY;  Surgeon: Nicholas Hollingshead, MD;  Location: [facility];  Service: General;  Laterality: N/A;  . right shoulder surgery      related to trauma   . VENTRAL HERNIA REPAIR N/A 11/19/2015   Procedure: LAPAROSCOPIC LYSIS OF ADHSIONS, VENTRAL HERNIA REPAIR WITH MESH;  Surgeon: Nicholas Confer, MD;  Location: [facility];  Service: General;  Laterality: N/A;   Social History   Social History  . Marital Status: Married    Spouse Name: N/A  . Number of Children: 5   Occupational History  . Futures trader    Social History Main Topics  . Smoking status: Former Smoker -- 2.00 packs/day for 40 years    Types: Cigarettes    Quit date: 2008  . Smokeless tobacco: Former Systems developer  . Alcohol Use: No  . Drug Use: No   Current Outpatient Prescriptions on File Prior to Visit  Medication Sig Dispense Refill  . albuterol (PROVENTIL HFA;VENTOLIN HFA) 108 (90 BASE) MCG/ACT inhaler Inhale 2 puffs into the lungs every 6 (six) hours as needed for wheezing or shortness of breath. 1 Inhaler 11  . Blood Glucose Monitoring Suppl (ONE TOUCH ULTRA SYSTEM KIT) W/DEVICE KIT 1 kit by Does not  apply route once. Use as directed to test blood sugar once daily  Dx:E11.49 1 each 0  . fluticasone (FLONASE) 50 MCG/ACT nasal spray PLACE 2 SPRAYS INTO BOTH NOSTRILS DAILY. 16 g 4  . gabapentin (NEURONTIN) 400 MG capsule TAKE ONE CAPSULE BY MOUTH 4 TIMES A DAY 120 capsule 5  . glipiZIDE (GLUCOTROL XL) 5 MG 24 hr tablet Take 2 tablets (10 mg total) by mouth daily with breakfast. 180 tablet 3  . glucose blood (ONE TOUCH ULTRA TEST) test strip Use 2x a day  200 each 11  . Lancets (ONETOUCH ULTRASOFT) lancets Use as instructed 100 each 3  . metFORMIN (GLUCOPHAGE) 500 MG tablet TAKE 1 TABLET BY MOUTH 2 TIMES DAILY WITH A MEAL. 180 tablet 1  . naproxen sodium (ANAPROX) 220 MG tablet Take 220 mg by mouth 2 (two) times daily as needed. For pain    . NONFORMULARY OR COMPOUNDED ITEM Neuropathy cream: mix Amitriptyline 2%, Lidocaine 2%, and Ketamine 1% - apply on feet 2x a day - 60 g container 1 each 5  . Tamsulosin HCl (FLOMAX) 0.4 MG CAPS Take 0.8 mg by mouth at bedtime.     . traMADol (ULTRAM) 50 MG tablet TAKE 1 TABLET BY MOUTH EVERY 6 HOURS AS NEEDED FOR PAIN 120 tablet 0  . umeclidinium-vilanterol (ANORO ELLIPTA) 62.5-25 MCG/INH AEPB Inhale 1 puff into the lungs daily. 60 each 5  . [DISCONTINUED] hydrochlorothiazide (MICROZIDE) 12.5 MG capsule Take 1 capsule (12.5 mg total) by mouth daily. 30 capsule 0   No current facility-administered medications on file prior to visit.    Allergies  Allergen Reactions  . Sudafed [Pseudoephedrine Hcl]     "makes me feel drunk"   Family History  Problem Relation Age of Onset  . Diabetes Mother   . Cancer Mother     skin  . Heart disease Mother   . Heart attack Mother   . Cancer Father     Leukemia  . Colon cancer Neg Hx    PE: BP 124/72 (BP Location: Right Arm, Patient Position: Sitting)   Pulse 84   Ht 5' 11" (1.803 m)   Wt 258 lb (117 kg)   SpO2 97%   BMI 35.98 kg/m  Wt Readings from Last 3 Encounters:  10/11/16 258 lb (117 kg)  08/23/16 258  lb (117 kg)  07/08/16 263 lb (119.3 kg)   Constitutional: overweight, in NAD Eyes: PERRLA, EOMI, no exophthalmos ENT: moist mucous membranes, no thyromegaly, no cervical lymphadenopathy Cardiovascular: RRR, + 2/6 SEM, no RG, + mild B LE edema Respiratory: CTA B Gastrointestinal: abdomen soft, NT, ND, BS+ Musculoskeletal: no deformities, strength intact in all 4 Skin: moist, warm, no rashes Neurological: no mild tremor with outstretched hands, DTR normal in all 4  ASSESSMENT: 1. DM2, non-insulin-dependent, uncontrolled, with complications - diabetic peripheral neuropathy  2. PN  3. Obesity  PLAN:  1. Patient with long-standing, uncontrolled diabetes, with good control on Metformin 500 mg twice a day + Glipizide ER 10 mg daily.  Sugars are still good in am, but higher around the time of his steroids and with occasional dietary indiscretions. He will not change regimen for now.  - I suggested to:  Patient Instructions  Please continue: - Metformin 500 mg 2x a day, with meals. - Glipizide ER 10 mg before breakfast.  Please come back for a follow-up appointment in 3 months.   - Continue checking sugars at different times of the day - check 1-2 times a day, rotating checks - advised for yearly eye exams >> he is UTD - Return to clinic in 3 mo with sugar log  2. PN - on the following Neuropathy cream: mix Amitriptyline 2%, Lidocaine 2%, and Ketamine 1% - apply on feet 2x a day >> feeling better - could not afford Lyrica (300$ per mo) - continue Neurontin, Tramadol  3. Obesity - weight stable since last visit  Philemon Kingdom, MD PhD [facility] Endocrinology

## 2016-10-11 NOTE — Patient Instructions (Addendum)
Please continue: - Metformin 500 mg 2x a day, with meals. - Glipizide ER 10 mg before breakfast.  Please come back for a follow-up appointment in 4 months.

## 2016-10-28 ENCOUNTER — Other Ambulatory Visit: Payer: Self-pay | Admitting: Family Medicine

## 2016-10-28 NOTE — Telephone Encounter (Signed)
Last f/u 05/2015 

## 2016-10-28 NOTE — Telephone Encounter (Signed)
Refill one time only.  Needs OV for further refills. 

## 2016-10-28 NOTE — Telephone Encounter (Signed)
Rx called in to requested pharmacy 

## 2016-11-01 DIAGNOSIS — H18413 Arcus senilis, bilateral: Secondary | ICD-10-CM | POA: Diagnosis not present

## 2016-11-01 DIAGNOSIS — H353131 Nonexudative age-related macular degeneration, bilateral, early dry stage: Secondary | ICD-10-CM | POA: Diagnosis not present

## 2016-11-01 DIAGNOSIS — H02839 Dermatochalasis of unspecified eye, unspecified eyelid: Secondary | ICD-10-CM | POA: Diagnosis not present

## 2016-11-01 DIAGNOSIS — H25011 Cortical age-related cataract, right eye: Secondary | ICD-10-CM | POA: Diagnosis not present

## 2016-11-01 DIAGNOSIS — H2513 Age-related nuclear cataract, bilateral: Secondary | ICD-10-CM | POA: Diagnosis not present

## 2016-11-01 DIAGNOSIS — H2511 Age-related nuclear cataract, right eye: Secondary | ICD-10-CM | POA: Diagnosis not present

## 2016-11-11 DIAGNOSIS — H2511 Age-related nuclear cataract, right eye: Secondary | ICD-10-CM | POA: Diagnosis not present

## 2016-11-11 DIAGNOSIS — H25811 Combined forms of age-related cataract, right eye: Secondary | ICD-10-CM | POA: Diagnosis not present

## 2016-11-11 DIAGNOSIS — H2512 Age-related nuclear cataract, left eye: Secondary | ICD-10-CM | POA: Diagnosis not present

## 2016-11-11 DIAGNOSIS — H2589 Other age-related cataract: Secondary | ICD-10-CM | POA: Diagnosis not present

## 2016-11-25 DIAGNOSIS — H25812 Combined forms of age-related cataract, left eye: Secondary | ICD-10-CM | POA: Diagnosis not present

## 2016-11-25 DIAGNOSIS — H2589 Other age-related cataract: Secondary | ICD-10-CM | POA: Diagnosis not present

## 2016-11-25 DIAGNOSIS — H2512 Age-related nuclear cataract, left eye: Secondary | ICD-10-CM | POA: Diagnosis not present

## 2016-11-30 ENCOUNTER — Other Ambulatory Visit: Payer: Self-pay

## 2016-11-30 ENCOUNTER — Other Ambulatory Visit: Payer: Self-pay | Admitting: Family Medicine

## 2016-11-30 NOTE — Telephone Encounter (Signed)
Faxed

## 2016-11-30 NOTE — Telephone Encounter (Signed)
Last refill 10/28/16 #120  Had medicare wellness with Lattie Haw on 01/18/16

## 2016-12-24 ENCOUNTER — Other Ambulatory Visit: Payer: Self-pay | Admitting: Family Medicine

## 2016-12-31 ENCOUNTER — Other Ambulatory Visit: Payer: Self-pay | Admitting: Internal Medicine

## 2017-01-02 ENCOUNTER — Other Ambulatory Visit: Payer: Self-pay | Admitting: Family Medicine

## 2017-01-03 ENCOUNTER — Encounter: Payer: Medicare Other | Attending: Internal Medicine | Admitting: Internal Medicine

## 2017-01-03 DIAGNOSIS — E11621 Type 2 diabetes mellitus with foot ulcer: Secondary | ICD-10-CM | POA: Insufficient documentation

## 2017-01-03 DIAGNOSIS — E1151 Type 2 diabetes mellitus with diabetic peripheral angiopathy without gangrene: Secondary | ICD-10-CM | POA: Insufficient documentation

## 2017-01-03 DIAGNOSIS — E1142 Type 2 diabetes mellitus with diabetic polyneuropathy: Secondary | ICD-10-CM | POA: Insufficient documentation

## 2017-01-03 DIAGNOSIS — N400 Enlarged prostate without lower urinary tract symptoms: Secondary | ICD-10-CM | POA: Diagnosis not present

## 2017-01-03 DIAGNOSIS — J449 Chronic obstructive pulmonary disease, unspecified: Secondary | ICD-10-CM | POA: Insufficient documentation

## 2017-01-03 DIAGNOSIS — E1140 Type 2 diabetes mellitus with diabetic neuropathy, unspecified: Secondary | ICD-10-CM | POA: Diagnosis not present

## 2017-01-03 DIAGNOSIS — Z7984 Long term (current) use of oral hypoglycemic drugs: Secondary | ICD-10-CM | POA: Insufficient documentation

## 2017-01-03 DIAGNOSIS — L97512 Non-pressure chronic ulcer of other part of right foot with fat layer exposed: Secondary | ICD-10-CM | POA: Diagnosis not present

## 2017-01-03 DIAGNOSIS — Z87891 Personal history of nicotine dependence: Secondary | ICD-10-CM | POA: Diagnosis not present

## 2017-01-03 DIAGNOSIS — G4730 Sleep apnea, unspecified: Secondary | ICD-10-CM | POA: Insufficient documentation

## 2017-01-03 DIAGNOSIS — L97511 Non-pressure chronic ulcer of other part of right foot limited to breakdown of skin: Secondary | ICD-10-CM | POA: Diagnosis not present

## 2017-01-04 NOTE — Progress Notes (Signed)
[name], [name] ([id number]) Visit Report for 01/03/2017 Chief Complaint Document Details Patient Name: [name], [name] Date of Service: 01/03/2017 8:00 AM Medical Record Number: [id number] Patient Account Number: [id number] Date of Birth/Sex: [birth year] (76 y.o. Male) Treating RN: Nicholas [name] Primary Care Provider: Arnette [name] Other Clinician: Referring Provider: Arnette [name] Treating Provider/Extender: Nicholas [name] in Treatment: 0 Information Obtained from: Patient Chief Complaint Left toe ulceration. 01/03/17; patient comes in with concerns about his right great toe in the medial nail bed Electronic Signature(s) Signed: 01/04/2017 7:58:45 AM By: Nicholas [name] Entered By: Nicholas Ham on 01/03/2017 08:49:07 [name], Nicholas ([id number]) -------------------------------------------------------------------------------- HPI Details Patient Name: [name], [name] Date of Service: 01/03/2017 8:00 AM Medical Record Number: [id number] Patient Account Number: [id number] Date of Birth/Sex: Feb 09, 1941 (76 y.o. Male) Treating RN: Nicholas [name] Primary Care Provider: Arnette [name] Other Clinician: Referring Provider: Arnette [name] Treating Provider/Extender: Nicholas [name] Weeks in Treatment: 0 History of Present Illness HPI Description: Pleasant 76 year old with history of diabetes (hemoglobin A1c 6.1 in November 2016), peripheral neuropathy, and sleep apnea. No history of PAD. Left ABI 1.01. He developed blisters and subsequent ulcerations on the plantar aspect of his left first and second toe in early December 2016. Denies any trauma, unusual activity, or new footwear. Has been fitted for orthotics in the past but did not like them. He is ambulating per his baseline without difficulty. No claudication or rest pain. 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 X-ray of left foot 09/23/2015 showed no evidence for osteomyelitis. Completed a course of  doxycycline. Performing dressing changes with Prisma. Awaiting orthotics at [facility]. He returns to clinic for follow-up and is without new complaints. No significant pain. No fever or chills. No drainage. 01/03/17; this is a patient with type 2 diabetes well controlled with most recent hemoglobin A1c of 5.9. He does however have diabetic peripheral neuropathy which is quite significant. Previously in this clinic with plantar ulcerations of his left first and second toe. He has done reasonably well although 2 weeks ago he noted some bleeding from the medial aspect of his right great toe. His daughter is apparently been working on this and is removed the medial part of the nail cuticle from the nailbed. There is still some bleeding. Because of his diabetic neuropathy status he was concerned enough to come in and see [place] today. He does not have a podiatrist. He does not have known PAD. Last arterial studies were normal but these were in 2013. His ABI in this clinic was 1.0 on the right. 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 He does not wear diabetic shoes however he has been told issues he has are as good as that Engineer, maintenance) Signed: 01/04/2017 7:58:45 AM By: Nicholas [name] Entered By: Nicholas Ham on 01/03/2017 08:51:55 [name], [name] ([id number]) -------------------------------------------------------------------------------- Physical Exam Details Patient Name: [name], [name] Date of Service: 01/03/2017 8:00 AM Medical Record Number: [id number] Patient Account Number: [id number] Date of Birth/Sex: 1940/12/22 (76 y.o. Male) Treating RN: Nicholas [name] Primary Care Provider: Arnette [name] Other Clinician: Referring Provider: Arnette [name] Treating Provider/Extender: Nicholas [name] Weeks in Treatment: 0 Constitutional Patient is hypertensive.. Pulse regular and within target range for patient.Marland Kitchen Respirations regular, non-labored and within  target range.. Temperature is normal and within the target range for the patient.. Patient's appearance is neat and clean. Appears in no acute distress. Well nourished and well developed.. Eyes Conjunctivae clear. No discharge.Marland Kitchen Respiratory  Respiratory effort is easy and symmetric bilaterally. Rate is normal at rest and on room air.. Cardiovascular Femoral pulse on the right is palpable. Pedal pulses absent bilaterally.. Lymphatic None palpable in the popliteal or inguinal area. Neurological Completely absent light touch to the microfilament test on the plantar aspect of his foot. Psychiatric No evidence of depression, anxiety, or agitation. Calm, cooperative, and communicative. Appropriate interactions and affect.. Notes Wound exam; the patient has a small open area in the medial aspect of the right great toenail bed. The medial nail cuticle itself is been removed I think by his daughter by description although she is not here. There is really no evidence of infection. I probed this with pickups there was no purulence. I Nicholas believe that there is any evidence of a paronychia Electronic Signature(s) Signed: 01/04/2017 7:58:45 AM By: Nicholas [name] Entered By: Nicholas Ham on 01/03/2017 08:54:42 [name], [name] ([id number]) -------------------------------------------------------------------------------- Physician Orders Details Patient Name: [name], [name] Date of Service: 01/03/2017 8:00 AM Medical Record Number: [id number] Patient Account Number: [id number] Date of Birth/Sex: 06-14-1941 (76 y.o. Male) Treating RN: Nicholas [name] Primary Care Provider: Arnette [name] Other Clinician: Referring Provider: Arnette [name] Treating Provider/Extender: Nicholas [name] in Treatment: 0 Verbal / Phone Orders: No Diagnosis Coding Wound Cleansing Wound #5 Right Toe Great o Clean wound with Normal Saline. Anesthetic Wound #5 Right Toe Great o Topical Lidocaine 4% cream applied to  wound bed prior to debridement Primary Wound Dressing Wound #5 Right Toe Great o Other: - triple antibiotic ointment Secondary Dressing Wound #5 Right Toe Great o Dry Gauze Dressing Change Frequency Wound #5 Right Toe Great o Change dressing every day. Follow-up Appointments Wound #5 Right Toe Great o Return Appointment in 1 week. Edema Control Wound #5 Right Toe Great o Elevate legs to the level of the heart and pump ankles as often as possible Electronic Signature(s) Signed: 01/03/2017 2:47:53 PM By: Gretta Cool RN, BSN, Kim RN, BSN Signed: 01/04/2017 7:58:45 AM By: Nicholas [name] Entered By: Gretta Cool, RN, BSN, Kim on 01/03/2017 08:44:27 [name], [name] ([id number]) [name], [name] ([id number]) -------------------------------------------------------------------------------- Problem List Details Patient Name: [name], [name] Date of Service: 01/03/2017 8:00 AM Medical Record Number: [id number] Patient Account Number: [id number] Date of Birth/Sex: 02-Dec-1940 (76 y.o. Male) Treating RN: Nicholas [name] Primary Care Provider: Arnette [name] Other Clinician: Referring Provider: Arnette [name] Treating Provider/Extender: Nicholas [name] Weeks in Treatment: 0 Active Problems ICD-10 Encounter Code Description Active Date Diagnosis E11.621 Type 2 diabetes mellitus with foot ulcer 01/03/2017 Yes L97.511 Non-pressure chronic ulcer of other part of right foot 01/03/2017 Yes limited to breakdown of skin E11.42 Type 2 diabetes mellitus with diabetic polyneuropathy 01/03/2017 Yes Inactive Problems Resolved Problems Electronic Signature(s) Signed: 01/04/2017 7:58:45 AM By: Nicholas [name] Entered By: Nicholas Ham on 01/03/2017 08:48:11 Heckert, Gaige L. ([id number]) -------------------------------------------------------------------------------- Progress Note/History and Physical Details Patient Name: [name], [name] Date of Service: 01/03/2017 8:00 AM Medical Record Number:  [id number] Patient Account Number: [id number] Date of Birth/Sex: 1941/10/08 (76 y.o. Male) Treating RN: Nicholas [name] Primary Care Provider: Arnette [name] Other Clinician: Referring Provider: Arnette [name] Treating Provider/Extender: Nicholas [name] in Treatment: 0 Subjective Chief Complaint Information obtained from Patient Left toe ulceration. 01/03/17; patient comes in with concerns about his right great toe in the medial nail bed History of Present Illness (HPI) Pleasant 76 year old with history of diabetes (hemoglobin A1c 6.1 in November 2016), peripheral neuropathy, and sleep apnea. No history of PAD. Left ABI 1.01. He developed  blisters and subsequent ulcerations on the plantar aspect of his left first and second toe in early December 2016. Denies any trauma, unusual activity, or new footwear. Has been fitted for orthotics in the past but did not like them. He is ambulating per his baseline without difficulty. No claudication or rest pain. 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 X-ray of left foot 09/23/2015 showed no evidence for osteomyelitis. Completed a course of doxycycline. Performing dressing changes with Prisma. Awaiting orthotics at [facility]. He returns to clinic for follow-up and is without new complaints. No significant pain. No fever or chills. No drainage. 01/03/17; this is a patient with type 2 diabetes well controlled with most recent hemoglobin A1c of 5.9. He does however have diabetic peripheral neuropathy which is quite significant. Previously in this clinic with plantar ulcerations of his left first and second toe. He has done reasonably well although 2 weeks ago he noted some bleeding from the medial aspect of his right great toe. His daughter is apparently been working on this and is removed the medial part of the nail cuticle from the nailbed. There is still some bleeding. Because of his diabetic neuropathy status he was concerned enough to  come in and see [place] today. He does not have a podiatrist. He does not have known PAD. Last arterial studies were normal but these were in 2013. His ABI in this clinic was 1.0 on the right. 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 He does not wear diabetic shoes however he has been told issues he has are as good as that Wound History Patient presents with 1 open wound that has been present for approximately 2 weeks. Patient has been treating wound in the following manner: neosporin. Laboratory tests have been performed in the last month. Patient reportedly has not tested positive for an antibiotic resistant organism. Patient reportedly has not tested positive for osteomyelitis. Patient History Information obtained from Patient. Schexnayder, Casey L. ([id number]) Allergies Sudafed Family History Cancer - Mother, Father, Diabetes - Mother, Heart Disease - Mother, Hypertension - Mother, No family history of Hereditary Spherocytosis, Kidney Disease, Lung Disease, Seizures, Stroke, Thyroid Problems, Tuberculosis. Social History Former smoker - quit about 25 years ago, Marital Status - Married, Alcohol Use - Never, Drug Use - No History, Caffeine Use - Never. Medical History Eyes Patient has history of Cataracts - 2/18 Denies history of Glaucoma, Optic Neuritis Ear/Nose/Mouth/Throat Denies history of Chronic sinus problems/congestion, Middle ear problems Hematologic/Lymphatic Denies history of Anemia, Hemophilia, Human Immunodeficiency Virus, Lymphedema, Sickle Cell Disease Respiratory Patient has history of Chronic Obstructive Pulmonary Disease (COPD) Denies history of Aspiration, Asthma, Pneumothorax, Sleep Apnea, Tuberculosis Cardiovascular Denies history of Angina, Arrhythmia, Congestive Heart Failure, Coronary Artery Disease, Deep Vein Thrombosis, Hypertension, Hypotension, Myocardial Infarction, Peripheral Arterial Disease, Peripheral Venous Disease,  Phlebitis, Vasculitis Gastrointestinal Denies history of Cirrhosis , Colitis, Crohn s, Hepatitis A, Hepatitis B, Hepatitis C Endocrine Patient has history of Type II Diabetes Denies history of Type I Diabetes Immunological Denies history of Lupus Erythematosus, Raynaud s, Scleroderma Integumentary (Skin) Denies history of History of Burn, History of pressure wounds Musculoskeletal Patient has history of Osteoarthritis - hands Denies history of Gout, Rheumatoid Arthritis, Osteomyelitis Neurologic Patient has history of Neuropathy - hands and feet Denies history of Dementia, Quadriplegia, Paraplegia, Seizure Disorder Oncologic Denies history of Received Chemotherapy, Received Radiation Psychiatric Denies history of Anorexia/bulimia, Confinement Anxiety Patient is treated with Oral Agents. Blood sugar is tested. Blood sugar results  noted at the following times: Breakfast - 174. Boullion, Gibran L. ([id number]) Hospitalization/Surgery History - 09/09/2012, [facility], small bowel obstruction and colostomy. Medical And Surgical History Notes Constitutional Symptoms (General Health) DM II; Genitourinary BPH Review of Systems (ROS) Constitutional Symptoms (General Health) The patient has no complaints or symptoms. Eyes The patient has no complaints or symptoms. Ear/Nose/Mouth/Throat The patient has no complaints or symptoms. Hematologic/Lymphatic The patient has no complaints or symptoms. Respiratory The patient has no complaints or symptoms. Cardiovascular Complains or has symptoms of LE edema. Denies complaints or symptoms of Chest pain. Endocrine The patient has no complaints or symptoms. Genitourinary The patient has no complaints or symptoms. Immunological The patient has no complaints or symptoms. Integumentary (Skin) Complains or has symptoms of Wounds, Bleeding or bruising tendency. Denies complaints or symptoms of Breakdown, Swelling. Musculoskeletal The patient has  no complaints or symptoms. Neurologic The patient has no complaints or symptoms. Oncologic The patient has no complaints or symptoms. Psychiatric The patient has no complaints or symptoms. Objective Constitutional Patient is hypertensive.. Pulse regular and within target range for patient.Marland Kitchen Respirations regular, non-labored Shadrick, Cordale L. ([id number]) and within target range.. Temperature is normal and within the target range for the patient.. Patient's appearance is neat and clean. Appears in no acute distress. Well nourished and well developed.. Vitals Time Taken: 8:07 AM, Height: 72 in, Weight: 260 lbs, BMI: 35.3, Temperature: 97.7 F, Pulse: 74 bpm, Respiratory Rate: 16 breaths/min, Blood Pressure: 154/67 mmHg. Eyes Conjunctivae clear. No discharge.Marland Kitchen Respiratory Respiratory effort is easy and symmetric bilaterally. Rate is normal at rest and on room air.. Cardiovascular Femoral pulse on the right is palpable. Pedal pulses absent bilaterally.. Lymphatic None palpable in the popliteal or inguinal area. Neurological Completely absent light touch to the microfilament test on the plantar aspect of his foot. Psychiatric No evidence of depression, anxiety, or agitation. Calm, cooperative, and communicative. Appropriate interactions and affect.. General Notes: Wound exam; the patient has a small open area in the medial aspect of the right great toenail bed. The medial nail cuticle itself is been removed I think by his daughter by description although she is not here. There is really no evidence of infection. I probed this with pickups there was no purulence. I Johnpatrick't believe that there is any evidence of a paronychia Integumentary (Hair, Skin) Wound #5 status is Open. Original cause of wound was Gradually Appeared. The wound is located on the Right Toe Great. The wound measures 0.7cm length x 0.5cm width x 0.2cm depth; 0.275cm^2 area and 0.055cm^3 volume. There is Fat Layer  (Subcutaneous Tissue) Exposed exposed. There is no tunneling or undermining noted. There is a medium amount of serous drainage noted. The wound margin is indistinct and nonvisible. There is no granulation within the wound bed. There is a large (67-100%) amount of necrotic tissue within the wound bed including Eschar and Adherent Slough. The periwound skin appearance did not exhibit: Callus, Crepitus, Excoriation, Induration, Rash, Scarring, Dry/Scaly, Maceration, Atrophie Blanche, Cyanosis, Ecchymosis, Hemosiderin Staining, Mottled, Pallor, Rubor, Erythema. Assessment Active Problems ICD-10 Blansett, Tyjay L. ([id number]) E11.621 - Type 2 diabetes mellitus with foot ulcer L97.511 - Non-pressure chronic ulcer of other part of right foot limited to breakdown of skin E11.42 - Type 2 diabetes mellitus with diabetic polyneuropathy Plan Wound Cleansing: Wound #5 Right Toe Great: Clean wound with Normal Saline. Anesthetic: Wound #5 Right Toe Great: Topical Lidocaine 4% cream applied to wound bed prior to debridement Primary Wound Dressing: Wound #5 Right Toe Great: Other: -  triple antibiotic ointment Secondary Dressing: Wound #5 Right Toe Great: Dry Gauze Dressing Change Frequency: Wound #5 Right Toe Great: Change dressing every day. Follow-up Appointments: Wound #5 Right Toe Great: Return Appointment in 1 week. Edema Control: Wound #5 Right Toe Great: Elevate legs to the level of the heart and pump ankles as often as possible #1 at this point I Shavon't think there is anything serious here. I agree with the topical antibiotics that they have been using at home after cleaning the medial aspect of the nail with soap and water either Neosporin or Polysporin would be adequate. #2 I Ames't think there is any need for systemic antibiotics #3 as the patient doesn't have an active podiatrist, will have him back one time to check this I think he can probably be discharged at this point Electronic  Signature(s) [name], [name] ([id number]) Signed: 01/04/2017 7:58:45 AM By: Nicholas [name] Entered By: Nicholas Ham on 01/03/2017 08:55:39 [name], [name] ([id number]) -------------------------------------------------------------------------------- ROS/PFSH Details Patient Name: [name], [name] Date of Service: 01/03/2017 8:00 AM Medical Record Number: [id number] Patient Account Number: [id number] Date of Birth/Sex: [birth year] (76 y.o. Male) Treating RN: Nicholas [name] Primary Care Provider: Arnette [name] Other Clinician: Referring Provider: Arnette [name] Treating Provider/Extender: Nicholas [name] Weeks in Treatment: 0 Label Progress Note Print Version as History and Physical for this encounter Information Obtained From Patient Wound History Do you currently have one or more open woundso Yes How many open wounds do you currently haveo 1 Approximately how long have you had your woundso 2 weeks How have you been treating your wound(s) until nowo neosporin Has your wound(s) ever healed and then re-openedo No Have you had any lab work done in the past montho Yes Have you tested positive for an antibiotic resistant organism (MRSA, VRE)o No Have you tested positive for osteomyelitis (bone infection)o No Cardiovascular Complaints and Symptoms: Positive for: LE edema Negative for: Chest pain Medical History: Negative for: Angina; Arrhythmia; Congestive Heart Failure; Coronary Artery Disease; Deep Vein Thrombosis; Hypertension; Hypotension; Myocardial Infarction; Peripheral Arterial Disease; Peripheral Venous Disease; Phlebitis; Vasculitis Integumentary (Skin) Complaints and Symptoms: Positive for: Wounds; Bleeding or bruising tendency Negative for: Breakdown; Swelling Medical History: Negative for: History of Burn; History of pressure wounds Constitutional Symptoms (General Health) Complaints and Symptoms: No Complaints or Symptoms Medical History: Past Medical History Notes: DM  II; Stang, Fardeen L. ([id number]) Eyes Complaints and Symptoms: No Complaints or Symptoms Medical History: Positive for: Cataracts - 2/18 Negative for: Glaucoma; Optic Neuritis Ear/Nose/Mouth/Throat Complaints and Symptoms: No Complaints or Symptoms Medical History: Negative for: Chronic sinus problems/congestion; Middle ear problems Hematologic/Lymphatic Complaints and Symptoms: No Complaints or Symptoms Medical History: Negative for: Anemia; Hemophilia; Human Immunodeficiency Virus; Lymphedema; Sickle Cell Disease Respiratory Complaints and Symptoms: No Complaints or Symptoms Medical History: Positive for: Chronic Obstructive Pulmonary Disease (COPD) Negative for: Aspiration; Asthma; Pneumothorax; Sleep Apnea; Tuberculosis Gastrointestinal Medical History: Negative for: Cirrhosis ; Colitis; Crohnos; Hepatitis A; Hepatitis B; Hepatitis C Endocrine Complaints and Symptoms: No Complaints or Symptoms Medical History: Positive for: Type II Diabetes Negative for: Type I Diabetes Time with diabetes: 5 years Treated with: Oral agents Blood sugar tested every day: Yes Tested : BID Blood sugar testing results: [name], [name] ([id number]) Breakfast: 174 Genitourinary Complaints and Symptoms: No Complaints or Symptoms Medical History: Past Medical History Notes: BPH Immunological Complaints and Symptoms: No Complaints or Symptoms Medical History: Negative for: Lupus Erythematosus; Raynaudos; Scleroderma Musculoskeletal Complaints and Symptoms: No Complaints or Symptoms Medical History: Positive for: Osteoarthritis -  hands Negative for: Gout; Rheumatoid Arthritis; Osteomyelitis Neurologic Complaints and Symptoms: No Complaints or Symptoms Medical History: Positive for: Neuropathy - hands and feet Negative for: Dementia; Quadriplegia; Paraplegia; Seizure Disorder Oncologic Complaints and Symptoms: No Complaints or Symptoms Medical History: Negative for: Received  Chemotherapy; Received Radiation Psychiatric Complaints and Symptoms: No Complaints or Symptoms Medical History: Manygoats, Odin L. ([id number]) Negative for: Anorexia/bulimia; Confinement Anxiety HBO Extended History Items Eyes: Cataracts Immunizations Pneumococcal Vaccine: Received Pneumococcal Vaccination: Yes Immunization Notes: up to date Hospitalization / Surgery History Name of Hospital Purpose of Hospitalization/Surgery Date [facility] small bowel obstruction and colostomy 09/09/2012 Family and Social History Cancer: Yes - Mother, Father; Diabetes: Yes - Mother; Heart Disease: Yes - Mother; Hereditary Spherocytosis: No; Hypertension: Yes - Mother; Kidney Disease: No; Lung Disease: No; Seizures: No; Stroke: No; Thyroid Problems: No; Tuberculosis: No; Former smoker - quit about 25 years ago; Marital Status - Married; Alcohol Use: Never; Drug Use: No History; Caffeine Use: Never; Financial Concerns: No; Food, Clothing or Shelter Needs: No; Support System Lacking: No; Transportation Concerns: No; Advanced Directives: No; Patient does not want information on Administrator) Signed: 01/03/2017 2:47:53 PM By: Gretta Cool, RN, BSN, Kim RN, BSN Signed: 01/04/2017 7:58:45 AM By: Nicholas [name] Entered By: Gretta Cool, RN, BSN, Kim on 01/03/2017 08:25:56 Macnaughton, Frazer L. ([id number]) -------------------------------------------------------------------------------- [place] Details Patient Name: Palladino, Webster L. Date of Service: 01/03/2017 Medical Record Number: [id number] Patient Account Number: [id number] Date of Birth/Sex: 12/06/1940 (76 y.o. Male) Treating RN: Nicholas [name] Primary Care Provider: Arnette [name] Other Clinician: Referring Provider: Arnette [name] Treating Provider/Extender: Nicholas [name] Weeks in Treatment: 0 Diagnosis Coding ICD-10 Codes Code Description E11.621 Type 2 diabetes mellitus with foot ulcer L97.511 Non-pressure chronic ulcer of other part  of right foot limited to breakdown of skin E11.42 Type 2 diabetes mellitus with diabetic polyneuropathy Facility Procedures CPT4 Code: [id number] Description: 99214 - WOUND CARE VISIT-LEV 4 EST PT Modifier: Quantity: 1 Physician Procedures CPT4: Description Modifier Quantity Code [id number] 99213 - WC PHYS LEVEL 3 - EST PT 1 ICD-10 Description Diagnosis E11.621 Type 2 diabetes mellitus with foot ulcer L97.511 Non-pressure chronic ulcer of other part of right foot limited to breakdown of skin Electronic Signature(s) Signed: 01/04/2017 7:58:45 AM By: Nicholas [name] Entered By: Nicholas Ham on 01/03/2017 08:56:14

## 2017-01-04 NOTE — Progress Notes (Addendum)
Stobaugh, Adithya L. ([id number]) Visit Report for 01/03/2017 Allergy List Details Patient Name: [name], [name] Date of Service: 01/03/2017 8:00 AM Medical Record Number: [id number] Patient Account Number: [id number] Date of Birth/Sex: 1941-01-07 (76 y.o. Male) Treating RN: Nicholas [name] Primary Care Nicholas [name]: Nicholas [name] Other Clinician: Referring Nicholas [name]: Nicholas [name] Treating Nicholas [name]/Extender: Nicholas [name] Weeks in Treatment: 0 Allergies Active Allergies Sudafed Allergy Notes Electronic Signature(s) Signed: 01/03/2017 2:47:53 PM By: Nicholas Cool, RN, BSN, Kim RN, BSN Entered By: Nicholas Cool, RN, BSN, Nicholas [name] on 01/03/2017 08:21:25 [name], [name] ([id number]) -------------------------------------------------------------------------------- Arrival Information Details Patient Name: [name], [name] Date of Service: 01/03/2017 8:00 AM Medical Record Number: [id number] Patient Account Number: [id number] Date of Birth/Sex: [birth year] (76 y.o. Male) Treating RN: Nicholas [name] Primary Care Nicholas [name]: Nicholas [name] Other Clinician: Referring Nicholas [name]: Nicholas [name] Treating Jimena Wieczorek/Extender: Nicholas [name] in Treatment: 0 Visit Information Patient Arrived: Ambulatory Arrival Time: 08:05 Accompanied By: wife in lobby Transfer Assistance: None Patient Identification Verified: Yes Secondary Verification Process Yes Completed: Patient Has Alerts: Yes Patient Alerts: Type II Diabetic History Since Last Visit Electronic Signature(s) Signed: 01/03/2017 2:47:53 PM By: Nicholas Cool, RN, BSN, Kim RN, BSN Entered By: Nicholas Cool, RN, BSN, Nicholas [name] on 01/03/2017 08:07:02 [name], [name] ([id number]) -------------------------------------------------------------------------------- Clinic Level of Care Assessment Details Patient Name: [name], [name] Date of Service: 01/03/2017 8:00 AM Medical Record Number: [id number] Patient Account Number: [id number] Date of Birth/Sex: [birth year] (76 y.o. Male) Treating RN: Nicholas [name] Primary Care  Nicholas [name]: Nicholas [name] Other Clinician: Referring Nicholas [name]: Nicholas [name] Treating Nicholas [name]/Extender: Nicholas [name] Weeks in Treatment: 0 Clinic Level of Care Assessment Items TOOL 2 Quantity Score []  - Use when only an EandM is performed on the INITIAL visit 0 ASSESSMENTS - Nursing Assessment / Reassessment X - General Physical Exam (combine w/ comprehensive assessment (listed just 1 20 below) when performed on new pt. evals) X - Comprehensive Assessment (HX, ROS, Risk Assessments, Wounds Hx, etc.) 1 25 ASSESSMENTS - Wound and Skin Assessment / Reassessment X - Simple Wound Assessment / Reassessment - one wound 1 5 []  - Complex Wound Assessment / Reassessment - multiple wounds 0 []  - Dermatologic / Skin Assessment (not related to wound area) 0 ASSESSMENTS - Ostomy and/or Continence Assessment and Care []  - Incontinence Assessment and Management 0 []  - Ostomy Care Assessment and Management (repouching, etc.) 0 PROCESS - Coordination of Care X - Simple Patient / Family Education for ongoing care 1 15 []  - Complex (extensive) Patient / Family Education for ongoing care 0 X - Staff obtains Programmer, systems, Records, Test Results / Process Orders 1 10 []  - Staff telephones HHA, Nursing Homes / Clarify orders / etc 0 []  - Routine Transfer to another Facility (non-emergent condition) 0 []  - Routine Hospital Admission (non-emergent condition) 0 X - New Admissions / Biomedical engineer / Ordering NPWT, Apligraf, etc. 1 15 []  - Emergency Hospital Admission (emergent condition) 0 X - Simple Discharge Coordination 1 10 [name], [name] ([id number]) []  - Complex (extensive) Discharge Coordination 0 PROCESS - Special Needs []  - Pediatric / Minor Patient Management 0 []  - Isolation Patient Management 0 []  - Hearing / Language / Visual special needs 0 []  - Assessment of Community assistance (transportation, D/C planning, etc.) 0 []  - Additional assistance / Altered mentation 0 []  - Support  Surface(s) Assessment (bed, cushion, seat, etc.) 0 INTERVENTIONS - Wound Cleansing / Measurement X - Wound Imaging (photographs - any number of wounds) 1 5 []  - Wound Tracing (instead of photographs) 0 X - Simple  Wound Measurement - one wound 1 5 []  - Complex Wound Measurement - multiple wounds 0 X - Simple Wound Cleansing - one wound 1 5 []  - Complex Wound Cleansing - multiple wounds 0 INTERVENTIONS - Wound Dressings X - Small Wound Dressing one or multiple wounds 1 10 []  - Medium Wound Dressing one or multiple wounds 0 []  - Large Wound Dressing one or multiple wounds 0 []  - Application of Medications - injection 0 INTERVENTIONS - Miscellaneous []  - External ear exam 0 []  - Specimen Collection (cultures, biopsies, blood, body fluids, etc.) 0 []  - Specimen(s) / Culture(s) sent or taken to Lab for analysis 0 []  - Patient Transfer (multiple staff / Civil Service fast streamer / Similar devices) 0 []  - Simple Staple / Suture removal (25 or less) 0 []  - Complex Staple / Suture removal (26 or more) 0 [name], [name] ([id number]) []  - Hypo / Hyperglycemic Management (close monitor of Blood Glucose) 0 []  - Ankle / Brachial Index (ABI) - do not check if billed separately 0 Has the patient been seen at the hospital within the last three years: Yes Total Score: 125 Level Of Care: New/Established - Level 4 Electronic Signature(s) Signed: 01/03/2017 2:47:53 PM By: Nicholas Cool, RN, BSN, Kim RN, BSN Entered By: Nicholas Cool, RN, BSN, Nicholas [name] on 01/03/2017 08:45:18 [name], [name] ([id number]) -------------------------------------------------------------------------------- Encounter Discharge Information Details Patient Name: [name], [name] Date of Service: 01/03/2017 8:00 AM Medical Record Number: [id number] Patient Account Number: [id number] Date of Birth/Sex: [birth year] (76 y.o. Male) Treating RN: Nicholas [name] Primary Care Loralai Eisman: Nicholas [name] Other Clinician: Referring Callista Hoh: Nicholas [name] Treating Chemere Steffler/Extender: Nicholas [name] in Treatment: 0 Encounter Discharge Information Items Discharge Pain Level: 3 Discharge Condition: Stable Ambulatory Status: Ambulatory Discharge Destination: Home Transportation: Private Auto Accompanied By: wife in loby Schedule Follow-up Appointment: Yes Medication Reconciliation completed and provided to Patient/Care Yes Payal Stanforth: Provided on Clinical Summary of Care: 01/03/2017 Form Type Recipient Paper Patient [facility] Electronic Signature(s) Signed: 01/03/2017 2:47:53 PM By: Nicholas Cool RN, BSN, Kim RN, BSN Previous Signature: 01/03/2017 8:48:44 AM Version By: Ruthine Dose Entered By: Nicholas Cool RN, BSN, Nicholas [name] on 01/03/2017 08:52:56 [name], [name] ([id number]) -------------------------------------------------------------------------------- Lower Extremity Assessment Details Patient Name: Hinks, Antonis L. Date of Service: 01/03/2017 8:00 AM Medical Record Number: [id number] Patient Account Number: [id number] Date of Birth/Sex: 1941/09/11 (76 y.o. Male) Treating RN: Nicholas [name] Primary Care Shirlean Berman: Nicholas [name] Other Clinician: Referring Annikah Lovins: Nicholas [name] Treating Tacora Athanas/Extender: Nicholas [name] Weeks in Treatment: 0 Edema Assessment Assessed: [Left: No] [Right: No] Edema: [Left: N] [Right: o] Vascular Assessment Pulses: Dorsalis Pedis Palpable: [Right:Yes] Doppler Audible: [Right:Yes] Posterior Tibial Palpable: [Right:Yes] Doppler Audible: [Right:Yes] Extremity colors, hair growth, and conditions: Extremity Color: [Right:Pale] Hair Growth on Extremity: [Right:Yes] Temperature of Extremity: [Right:Warm] Capillary Refill: [Right:< 3 seconds] Dependent Rubor: [Right:No] Blanched when Elevated: [Right:No] Lipodermatosclerosis: [Right:No] Blood Pressure: Brachial: [Right:118] Dorsalis Pedis: [Left:Dorsalis Pedis: 128] Ankle: Posterior Tibial: [Left:Posterior Tibial: 100] [Right:1.08] Toe Nail Assessment Left: Right: Thick: Yes Discolored:  Yes Deformed: Yes Improper Length and Hygiene: No Electronic Signature(s) Signed: 01/03/2017 2:47:53 PM By: Nicholas Cool RN, BSN, Kim RN, BSN [place], Kyland L. ([id number]) Entered By: Nicholas Cool, RN, BSN, Nicholas [name] on 01/03/2017 08:20:55 Mencer, Hagen L. ([id number]) -------------------------------------------------------------------------------- Multi Wound Chart Details Patient Name: [name], [name] Date of Service: 01/03/2017 8:00 AM Medical Record Number: [id number] Patient Account Number: [id number] Date of Birth/Sex: 1940-10-23 (76 y.o. Male) Treating RN: Nicholas [name] Primary Care Jazmeen Axtell: Nicholas [name] Other Clinician: Referring Danne Vasek: Nicholas [name] Treating Bertina Guthridge/Extender: Nicholas [name]  in Treatment: 0 Vital Signs Height(in): 72 Pulse(bpm): 74 Weight(lbs): 260 Blood Pressure 154/67 (mmHg): Body Mass Index(BMI): 35 Temperature(F): 97.7 Respiratory Rate 16 (breaths/min): Photos: [N/A:N/A] Wound Location: Right Toe Great N/A N/A Wounding Event: Gradually Appeared N/A N/A Primary Etiology: Diabetic Wound/Ulcer of N/A N/A the Lower Extremity Comorbid History: Cataracts, Chronic N/A N/A Obstructive Pulmonary Disease (COPD), Type II Diabetes, Osteoarthritis, Neuropathy Date Acquired: 12/20/2016 N/A N/A Weeks of Treatment: 0 N/A N/A Wound Status: Open N/A N/A Measurements L x W x D 0.7x0.5x0.2 N/A N/A (cm) Area (cm) : 0.275 N/A N/A Volume (cm) : 0.055 N/A N/A % Reduction in Area: 0.00% N/A N/A % Reduction in Volume: 0.00% N/A N/A Classification: Grade 2 N/A N/A Exudate Amount: Medium N/A N/A Exudate Type: Serous N/A N/A Exudate Color: amber N/A N/A Wound Margin: Indistinct, nonvisible N/A N/A Granulation Amount: None Present (0%) N/A N/A Brill, Shaunte L. ([id number]) Necrotic Amount: Large (67-100%) N/A N/A Necrotic Tissue: Eschar, Adherent Slough N/A N/A Exposed Structures: Fat Layer (Subcutaneous N/A N/A Tissue) Exposed: Yes Fascia: No Tendon: No Muscle: No Joint:  No Bone: No Epithelialization: None N/A N/A Periwound Skin Texture: Excoriation: No N/A N/A Induration: No Callus: No Crepitus: No Rash: No Scarring: No Periwound Skin Maceration: No N/A N/A Moisture: Dry/Scaly: No Periwound Skin Color: Atrophie Blanche: No N/A N/A Cyanosis: No Ecchymosis: No Erythema: No Hemosiderin Staining: No Mottled: No Pallor: No Rubor: No Tenderness on No N/A N/A Palpation: Wound Preparation: Ulcer Cleansing: N/A N/A Rinsed/Irrigated with Saline Topical Anesthetic Applied: Other: lidocaine 4% Treatment Notes Electronic Signature(s) Signed: 01/04/2017 7:58:45 AM By: Linton Ham MD Entered By: Linton Ham on 01/03/2017 08:48:29 Coker, Turner L. ([id number]) -------------------------------------------------------------------------------- [place] Details Patient Name: Pasley, Lenord L. Date of Service: 01/03/2017 8:00 AM Medical Record Number: [id number] Patient Account Number: [id number] Date of Birth/Sex: [birth year] (76 y.o. Male) Treating RN: Nicholas [name] Primary Care Daneille Desilva: Nicholas [name] Other Clinician: Referring Johnluke Haugen: Nicholas [name] Treating Maud Rubendall/Extender: Nicholas [name] in Treatment: 0 Active Inactive Electronic Signature(s) Signed: 01/27/2017 11:42:31 AM By: Nicholas Cool RN, BSN, Kim RN, BSN Previous Signature: 01/03/2017 2:47:53 PM Version By: Nicholas Cool RN, BSN, Kim RN, BSN Entered By: Nicholas Cool, RN, BSN, Nicholas [name] on 01/27/2017 11:42:29 Maready, Perry L. ([id number]) -------------------------------------------------------------------------------- Pain Assessment Details Patient Name: Lui, Jevaun L. Date of Service: 01/03/2017 8:00 AM Medical Record Number: [id number] Patient Account Number: [id number] Date of Birth/Sex: Oct 28, 1940 (76 y.o. Male) Treating RN: Nicholas [name] Primary Care Lanay Zinda: Nicholas [name] Other Clinician: Referring Wing Gfeller: Nicholas [name] Treating Kiyoshi Schaab/Extender: Nicholas [name] Weeks in Treatment:  0 Active Problems Location of Pain Severity and Description of Pain Patient Has Paino Yes Site Locations Pain Location: Generalized Pain With Dressing Change: Yes Rate the pain. Current Pain Level: 3 Pain Management and Medication Current Pain Management: Goals for Pain Management Topical or injectable lidocaine is offered to patient for acute pain when surgical debridement is performed. If needed, Patient is instructed to use over the counter pain medication for the following 24-48 hours after debridement. Wound care MDs do not prescribed pain medications. Patient has chronic pain or uncontrolled pain. Patient has been instructed to make an appointment with their Primary Care Physician for pain management. Notes Pain due to neuropathy. Electronic Signature(s) Signed: 01/03/2017 2:47:53 PM By: Nicholas Cool, RN, BSN, Kim RN, BSN Entered By: Nicholas Cool, RN, BSN, Nicholas [name] on 01/03/2017 08:07:48 Strahle, Jaevion L. ([id number]) -------------------------------------------------------------------------------- Patient/Caregiver Education Details Patient Name: Probert, Sheldon L. Date of Service: 01/03/2017 8:00 AM Medical Record Number: [id number] Patient Account Number: [id number] Date of  Birth/Gender: 07-30-1941 (76 y.o. Male) Treating RN: Nicholas [name] Primary Care Physician: Nicholas [name] Other Clinician: Referring Physician: Arnette [name] Treating Physician/Extender: Nicholas [name] in Treatment: 0 Education Assessment Education Provided To: Patient Education Topics Provided Welcome To [facility]: Handouts: Welcome To [facility] [place] Methods: Demonstration Responses: State content correctly Wound/Skin Impairment: Handouts: Caring for Your Ulcer, Other: wound care as prescribed Methods: Demonstration, Explain/Verbal Responses: State content correctly Electronic Signature(s) Signed: 01/03/2017 2:47:53 PM By: Nicholas Cool, RN, BSN, Kim RN, BSN Entered By: Nicholas Cool, RN, BSN, Nicholas [name] on 01/03/2017  08:53:33 Holthaus, Rashaud L. ([id number]) -------------------------------------------------------------------------------- Wound Assessment Details Patient Name: Seabury, Javonta L. Date of Service: 01/03/2017 8:00 AM Medical Record Number: [id number] Patient Account Number: [id number] Date of Birth/Sex: 1941/01/02 (76 y.o. Male) Treating RN: Nicholas [name] Primary Care Glenice Ciccone: Nicholas [name] Other Clinician: Referring Lolitha Tortora: Nicholas [name] Treating Windy Dudek/Extender: Nicholas [name] Weeks in Treatment: 0 Wound Status Wound Number: 5 Primary Diabetic Wound/Ulcer of the Lower Etiology: Extremity Wound Location: Right Toe Great Wound Open Wounding Event: Gradually Appeared Status: Date Acquired: 12/20/2016 Comorbid Cataracts, Chronic Obstructive Weeks Of Treatment: 0 History: Pulmonary Disease (COPD), Type II Clustered Wound: No Diabetes, Osteoarthritis, Neuropathy Photos Wound Measurements Length: (cm) 0.7 Width: (cm) 0.5 Depth: (cm) 0.2 Area: (cm) 0.275 Volume: (cm) 0.055 % Reduction in Area: 0% % Reduction in Volume: 0% Epithelialization: None Tunneling: No Undermining: No Wound Description Classification: Grade 2 Foul Odor Afte Wound Margin: Indistinct, nonvisible Slough/Fibrino Exudate Amount: Medium Exudate Type: Serous Exudate Color: amber r Cleansing: No No Wound Bed Granulation Amount: None Present (0%) Exposed Structure Necrotic Amount: Large (67-100%) Fascia Exposed: No Necrotic Quality: Eschar, Adherent Slough Fat Layer (Subcutaneous Tissue) Exposed: Yes Tendon Exposed: No Muscle Exposed: No Joint Exposed: No Bone Exposed: No Mihalic, Jamis L. ([id number]) Periwound Skin Texture Texture Color No Abnormalities Noted: No No Abnormalities Noted: No Callus: No Atrophie Blanche: No Crepitus: No Cyanosis: No Excoriation: No Ecchymosis: No Induration: No Erythema: No Rash: No Hemosiderin Staining: No Scarring: No Mottled: No Pallor: No Moisture Rubor:  No No Abnormalities Noted: No Dry / Scaly: No Maceration: No Wound Preparation Ulcer Cleansing: Rinsed/Irrigated with Saline Topical Anesthetic Applied: Other: lidocaine 4%, Electronic Signature(s) Signed: 01/03/2017 2:47:53 PM By: Nicholas Cool, RN, BSN, Kim RN, BSN Entered By: Nicholas Cool, RN, BSN, Nicholas [name] on 01/03/2017 08:29:59 Dunstan, Elya L. ([id number]) -------------------------------------------------------------------------------- [place] Details Patient Name: Finken, Hjalmar L. Date of Service: 01/03/2017 8:00 AM Medical Record Number: [id number] Patient Account Number: [id number] Date of Birth/Sex: Apr 07, 1941 (76 y.o. Male) Treating RN: Nicholas [name] Primary Care Eldonna Neuenfeldt: Nicholas [name] Other Clinician: Referring Tracen Mahler: Nicholas [name] Treating Arleen Bar/Extender: Nicholas [name] Weeks in Treatment: 0 Vital Signs Time Taken: 08:07 Temperature (F): 97.7 Height (in): 72 Pulse (bpm): 74 Weight (lbs): 260 Respiratory Rate (breaths/min): 16 Body Mass Index (BMI): 35.3 Blood Pressure (mmHg): 154/67 Reference Range: 80 - 120 mg / dl Electronic Signature(s) Signed: 01/03/2017 2:47:53 PM By: Nicholas Cool, RN, BSN, Kim RN, BSN Entered By: Nicholas Cool, RN, BSN, Nicholas [name] on 01/03/2017 08:08:33

## 2017-01-04 NOTE — Progress Notes (Signed)
[name], Nicholas [name]. ([id number]) Visit Report for 01/03/2017 Abuse/Suicide Risk Screen Details Patient Name: Nicholas [name], Nicholas [name]. Date of Service: 01/03/2017 8:00 AM Medical Record Number: [id number] Patient Account Number: [id number] Date of Birth/Sex: Dec 11, 1940 (76 y.o. Male) Treating RN: Cornell Barman Primary Care Jacquan Savas: Arnette Norris Other Clinician: Referring Aleksia Freiman: Arnette Norris Treating Chad Donoghue/Extender: Ricard Dillon Weeks in Treatment: 0 Abuse/Suicide Risk Screen Items Answer ABUSE/SUICIDE RISK SCREEN: Has anyone close to you tried to hurt or harm you recentlyo No Do you feel uncomfortable with anyone in your familyo No Has anyone forced you do things that you didnot want to doo No Do you have any thoughts of harming yourselfo No Patient displays signs or symptoms of abuse and/or neglect. No Electronic Signature(s) Signed: 01/03/2017 2:47:53 PM By: Gretta Cool RN, BSN, Kim RN, BSN Entered By: Gretta Cool, RN, BSN, Kim on 01/03/2017 08:26:07 [place], [place] ([id number]) -------------------------------------------------------------------------------- Activities of Daily Living Details Patient Name: [name], Nicholas [name]. Date of Service: 01/03/2017 8:00 AM Medical Record Number: [id number] Patient Account Number: [id number] Date of Birth/Sex: December 25, 1940 (76 y.o. Male) Treating RN: Cornell Barman Primary Care Herman Fiero: Arnette Norris Other Clinician: Referring Nicholad Kautzman: Arnette Norris Treating Mionna Advincula/Extender: Ricard Dillon Weeks in Treatment: 0 Activities of Daily Living Items Answer Activities of Daily Living (Please select one for each item) Drive Automobile Completely Able Take Medications Completely Able Use Telephone Completely Able Care for Appearance Completely Able Use Toilet Completely Able Bath / Shower Completely Able Dress Self Completely Able Feed Self Completely Able Walk Completely Able Get In / Out Bed Completely Able Housework Completely Able Prepare Meals Completely [place] for Self Completely Able Electronic Signature(s) Signed: 01/03/2017 2:47:53 PM By: Gretta Cool, RN, BSN, Kim RN, BSN Entered By: Gretta Cool, RN, BSN, Kim on 01/03/2017 08:26:16 Magill, Rihan [name]. ([id number]) -------------------------------------------------------------------------------- Education Assessment Details Patient Name: [name], Nicholas [name]. Date of Service: 01/03/2017 8:00 AM Medical Record Number: [id number] Patient Account Number: [id number] Date of Birth/Sex: Aug 02, 1941 (76 y.o. Male) Treating RN: Cornell Barman Primary Care Donnalynn Wheeless: Arnette Norris Other Clinician: Referring Weldon Nouri: Arnette Norris Treating Keaghan Staton/Extender: Tito Dine in Treatment: 0 Primary Learner Assessed: Patient Learning Preferences/Education Level/Primary Language Learning Preference: Explanation, Demonstration Highest Education Level: Grade School Preferred Language: English Cognitive Barrier Assessment/Beliefs Language Barrier: No Translator Needed: No Memory Deficit: No Emotional Barrier: No Cultural/Religious Beliefs Affecting Medical No Care: Physical Barrier Assessment Impaired Vision: No Impaired Hearing: No Decreased Hand dexterity: No Knowledge/Comprehension Assessment Knowledge Level: High Comprehension Level: High Ability to understand written High instructions: Ability to understand verbal High instructions: Motivation Assessment Anxiety Level: Calm Cooperation: Cooperative Education Importance: Acknowledges Need Interest in Health Problems: Asks Questions Perception: Coherent Willingness to Engage in Self- High Management Activities: Readiness to Engage in Self- High Management Activities: Electronic Signature(s) [name], [name] ([id number]) Signed: 01/03/2017 2:47:53 PM By: Gretta Cool RN, BSN, Kim RN, BSN Entered By: Gretta Cool, RN, BSN, Kim on 01/03/2017 08:26:46 [name], [name]  ([id number]) -------------------------------------------------------------------------------- Fall Risk Assessment Details Patient Name: [name], Nicholas [name]. Date of Service: 01/03/2017 8:00 AM Medical Record Number: [id number] Patient Account Number: [id number] Date of Birth/Sex: [birth year] (76 y.o. Male) Treating RN: Cornell Barman Primary Care Jiaire Rosebrook: Arnette Norris Other Clinician: Referring Janus Vlcek: Arnette Norris Treating Carlous Olivares/Extender: Ricard Dillon Weeks in Treatment: 0 Fall Risk Assessment Items Have you had 2 or more falls in the last 12 monthso 0 No Have you had any fall that resulted in injury in the last 12 monthso 0 No FALL RISK ASSESSMENT: History of falling -  immediate or within 3 months 0 No Secondary diagnosis 0 No Ambulatory aid None/bed rest/wheelchair/nurse 0 Yes Crutches/cane/walker 0 No Furniture 0 No IV Access/Saline Lock 0 No Gait/Training Normal/bed rest/immobile 0 Yes Weak 0 No Impaired 0 No Mental Status Oriented to own ability 0 Yes Electronic Signature(s) Signed: 01/03/2017 2:47:53 PM By: Gretta Cool, RN, BSN, Kim RN, BSN Entered By: Gretta Cool, RN, BSN, Kim on 01/03/2017 08:26:57 Givan, Clint ([id number]) -------------------------------------------------------------------------------- Foot Assessment Details Patient Name: [name], Nicholas [name]. Date of Service: 01/03/2017 8:00 AM Medical Record Number: [id number] Patient Account Number: [id number] Date of Birth/Sex: 11/01/1940 (76 y.o. Male) Treating RN: Cornell Barman Primary Care Josefa [place]: Arnette Norris Other Clinician: Referring Jakaiden Fill: Arnette Norris Treating Clemons Salvucci/Extender: Ricard Dillon Weeks in Treatment: 0 Foot Assessment Items Site Locations + = Sensation present, - = Sensation absent, C = Callus, U = Ulcer R = Redness, W = Warmth, M = Maceration, PU = Pre-ulcerative lesion F = Fissure, S = Swelling, D = Dryness Assessment Right: Left: Other Deformity: No No Prior Foot Ulcer: No No Prior Amputation: No  No Charcot Joint: No No Ambulatory Status: Ambulatory Without Help Gait: Steady Electronic Signature(s) Signed: 01/03/2017 2:47:53 PM By: Gretta Cool, RN, BSN, Kim RN, BSN Entered By: Gretta Cool, RN, BSN, Kim on 01/03/2017 08:28:11 Keithly, Marcellino [name]. ([id number]) -------------------------------------------------------------------------------- Nutrition Risk Assessment Details Patient Name: Carone, Keith [name]. Date of Service: 01/03/2017 8:00 AM Medical Record Number: [id number] Patient Account Number: [id number] Date of Birth/Sex: 25-Aug-1941 (76 y.o. Male) Treating RN: Cornell Barman Primary Care Totiana Everson: Arnette Norris Other Clinician: Referring Damoni Causby: Arnette Norris Treating Mikolaj Woolstenhulme/Extender: Ricard Dillon Weeks in Treatment: 0 Height (in): 72 Weight (lbs): 260 Body Mass Index (BMI): 35.3 Nutrition Risk Assessment Items NUTRITION RISK SCREEN: I have an illness or condition that made me change the kind and/or 0 No amount of food I eat I eat fewer than two meals per day 0 No I eat few fruits and vegetables, or milk products 0 No I have three or more drinks of beer, liquor or wine almost every day 0 No I have tooth or mouth problems that make it hard for me to eat 0 No I Boyde't always have enough money to buy the food I need 0 No I eat alone most of the time 0 No I take three or more different prescribed or over-the-counter drugs a 0 No day Without wanting to, I have lost or gained 10 pounds in the last six 0 No months I am not always physically able to shop, cook and/or feed myself 0 No Nutrition Protocols Good Risk Protocol 0 No interventions needed Moderate Risk Protocol Electronic Signature(s) Signed: 01/03/2017 2:47:53 PM By: Gretta Cool, RN, BSN, Kim RN, BSN Entered By: Gretta Cool, RN, BSN, Kim on 01/03/2017 79:43:27

## 2017-01-09 ENCOUNTER — Other Ambulatory Visit: Payer: Self-pay | Admitting: Family Medicine

## 2017-01-10 ENCOUNTER — Ambulatory Visit: Payer: Medicare Other | Admitting: Nurse Practitioner

## 2017-01-17 ENCOUNTER — Ambulatory Visit: Payer: Medicare Other | Admitting: Internal Medicine

## 2017-01-20 ENCOUNTER — Ambulatory Visit (INDEPENDENT_AMBULATORY_CARE_PROVIDER_SITE_OTHER): Payer: Medicare Other

## 2017-01-20 ENCOUNTER — Other Ambulatory Visit: Payer: Self-pay | Admitting: Family Medicine

## 2017-01-20 VITALS — BP 122/78 | HR 79 | Temp 98.0°F | Ht 70.25 in | Wt 258.5 lb

## 2017-01-20 DIAGNOSIS — E1140 Type 2 diabetes mellitus with diabetic neuropathy, unspecified: Secondary | ICD-10-CM

## 2017-01-20 DIAGNOSIS — Z0000 Encounter for general adult medical examination without abnormal findings: Secondary | ICD-10-CM | POA: Diagnosis not present

## 2017-01-20 DIAGNOSIS — E785 Hyperlipidemia, unspecified: Secondary | ICD-10-CM

## 2017-01-20 LAB — MICROALBUMIN / CREATININE URINE RATIO
Creatinine,U: 98.1 mg/dL
Microalb Creat Ratio: 7.1 mg/g (ref 0.0–30.0)
Microalb, Ur: 7 mg/dL — ABNORMAL HIGH (ref 0.0–1.9)

## 2017-01-20 LAB — COMPREHENSIVE METABOLIC PANEL
ALT: 32 U/L (ref 0–53)
AST: 22 U/L (ref 0–37)
Albumin: 4.1 g/dL (ref 3.5–5.2)
Alkaline Phosphatase: 54 U/L (ref 39–117)
BUN: 16 mg/dL (ref 6–23)
CALCIUM: 9.5 mg/dL (ref 8.4–10.5)
CHLORIDE: 102 meq/L (ref 96–112)
CO2: 30 meq/L (ref 19–32)
CREATININE: 1.09 mg/dL (ref 0.40–1.50)
GFR: 70.1 mL/min (ref >60.00)
GLUCOSE: 171 mg/dL — AB (ref 70–99)
Potassium: 4.2 mEq/L (ref 3.5–5.1)
Sodium: 137 mEq/L (ref 135–145)
Total Bilirubin: 0.5 mg/dL (ref 0.2–1.2)
Total Protein: 6.7 g/dL (ref 6.0–8.3)

## 2017-01-20 LAB — LIPID PANEL
CHOL/HDL RATIO: 4
Cholesterol: 174 mg/dL (ref 0–200)
HDL: 42.2 mg/dL (ref >39.00)
LDL Cholesterol: 102 mg/dL — ABNORMAL HIGH (ref 0–99)
NONHDL: 131.36
TRIGLYCERIDES: 148 mg/dL (ref 0.0–149.0)
VLDL: 29.6 mg/dL (ref 0.0–40.0)

## 2017-01-20 LAB — PSA, MEDICARE: PSA: 3.56 ng/ml (ref 0.10–4.00)

## 2017-01-20 LAB — HEMOGLOBIN A1C: Hgb A1c MFr Bld: 7.3 % — ABNORMAL HIGH (ref 4.6–6.5)

## 2017-01-20 NOTE — Progress Notes (Signed)
Pre visit review using our clinic review tool, if applicable. No additional management support is needed unless otherwise documented below in the visit note. 

## 2017-01-20 NOTE — Patient Instructions (Signed)
Nicholas Nicholas [name] , Thank you for taking time to come for your Medicare Wellness Visit. I appreciate your ongoing commitment to your health goals. Please review the following plan we discussed and let me know if I Nicholas [name] assist you in the future.   These are the goals we discussed: Goals    . Increase physical activity          Starting 01/20/2017, I will continue to walk 2-3 miles daily.         This is a list of the screening recommended for you and due dates:  Health Maintenance  Topic Date Due  . Complete foot exam   02/15/2017*  . Flu Shot  07/08/2017*  . Hemoglobin A1C  04/10/2017  . Eye exam for diabetics  07/28/2017  . Urine Protein Check  01/20/2018  . Colon Cancer Screening  04/18/2022  . Tetanus Vaccine  07/08/2024  . Pneumonia vaccines  Completed  *Topic was postponed. The date shown is not the original due date.   Preventive Care for Adults  A healthy lifestyle and preventive care Nicholas [name] promote health and wellness. Preventive health guidelines for adults include the following key practices.  . A routine yearly physical is a good way to check with your health care provider about your health and preventive screening. It is a chance to share any concerns and updates on your health and to receive a thorough exam.  . Visit your dentist for a routine exam and preventive care every 6 months. Brush your teeth twice a day and floss once a day. Good oral hygiene prevents tooth decay and gum disease.  . The frequency of eye exams is based on your age, health, family medical history, use  of contact lenses, and other factors. Follow your health care provider's ecommendations for frequency of eye exams.  . Eat a healthy diet. Foods like vegetables, fruits, whole grains, low-fat dairy products, and lean protein foods contain the nutrients you need without too many calories. Decrease your intake of foods high in solid fats, added sugars, and salt. Eat the right amount of calories for you. Get  information about a proper diet from your health care provider, if necessary.  . Regular physical exercise is one of the most important things you can do for your health. Most adults should get at least 150 minutes of moderate-intensity exercise (any activity that increases your heart rate and causes you to sweat) each week. In addition, most adults need muscle-strengthening exercises on 2 or more days a week.  Silver Sneakers may be a benefit available to you. To determine eligibility, you may visit the website: www.silversneakers.com or contact program at (785) 277-8354 Mon-Fri between 8AM-8PM.   . Maintain a healthy weight. The body mass index (BMI) is a screening tool to identify possible weight problems. It provides an estimate of body fat based on height and weight. Your health care provider Nicholas [name] find your BMI and Nicholas [name] help you achieve or maintain a healthy weight.   For adults 20 years and older: ? A BMI below 18.5 is considered underweight. ? A BMI of 18.5 to 24.9 is normal. ? A BMI of 25 to 29.9 is considered overweight. ? A BMI of 30 and above is considered obese.   . Maintain normal blood lipids and cholesterol levels by exercising and minimizing your intake of saturated fat. Eat a balanced diet with plenty of fruit and vegetables. Blood tests for lipids and cholesterol should begin at age 76 and be repeated every  5 years. If your lipid or cholesterol levels are high, you are over 50, or you are at high risk for heart disease, you may need your cholesterol levels checked more frequently. Ongoing high lipid and cholesterol levels should be treated with medicines if diet and exercise are not working.  . If you smoke, find out from your health care provider how to quit. If you do not use tobacco, please do not start.  . If you choose to drink alcohol, please do not consume more than 2 drinks per day. One drink is considered to be 12 ounces (355 mL) of beer, 5 ounces (148 mL) of wine, or 1.5  ounces (44 mL) of liquor.  . If you are 67-75 years old, ask your health care provider if you should take aspirin to prevent strokes.  . Use sunscreen. Apply sunscreen liberally and repeatedly throughout the day. You should seek shade when your shadow is shorter than you. Protect yourself by wearing long sleeves, pants, a wide-brimmed hat, and sunglasses year round, whenever you are outdoors.  . Once a month, do a whole body skin exam, using a mirror to look at the skin on your back. Tell your health care provider of new moles, moles that have irregular borders, moles that are larger than a pencil eraser, or moles that have changed in shape or color.

## 2017-01-20 NOTE — Progress Notes (Signed)
Subjective:   Nicholas [name] is a 76 y.o. male who presents for Medicare Annual/Subsequent preventive examination.  Review of Systems:  N/A Cardiac Risk Factors include: advanced age (>60mn, >>34women);male gender;obesity (BMI >30kg/m2);diabetes mellitus;dyslipidemia     Objective:    Vitals: BP 122/78 (BP Location: Right Arm, Patient Position: Sitting, Cuff Size: Normal)   Pulse 79   Temp 98 F (36.7 C) (Oral)   Ht 5' 10.25" (1.784 m) Comment: no shoes  Wt 258 lb 8 oz (117.3 kg)   SpO2 97%   BMI 36.83 kg/m   Body mass index is 36.83 kg/m.  Tobacco History  Smoking Status  . Former Smoker  . Packs/day: 2.00  . Years: 40.00  . Types: Cigarettes  . Quit date: 10/10/1993  Smokeless Tobacco  . Former UEngineer, structuralgiven: No   Past Medical History:  Diagnosis Date  . Arthritis   . Blister of foot or toe    left ssecond toe healing per note of 09/21/12   . BPH (benign prostatic hypertrophy)   . [facility] [place]   . Descending colostomy s/p reversal 09/27/12 09/28/2012  . Diabetes mellitus    [place]  . Diabetic foot ulcer ([place] 09/22/2015  . Diverticulitis of colon with perforation 12/24/2011  . Heart murmur    noted on visit of 09/20/12- [facility]   . [place]   . OSA on CPAP    cpap- doe snot know settings   . Pneumonia    hx. of  . Shortness of breath dyspnea   . Sinus infection    dx 12/13   . Ventral hernia 2013   Past Surgical History:  Procedure Laterality Date  . APPENDECTOMY  12/23/2011   Procedure: APPENDECTOMY;  Surgeon: TOdis Hollingshead MD;  Location: [facility];  Service: General;  Laterality: N/A;  . bilateral hand surgery      metal placed in hands due to trauma in 2011   . CARDIAC CATHETERIZATION     [facility]  . CHOLECYSTECTOMY    . COLOSTOMY REVISION  12/23/2011   Procedure: COLON RESECTION SIGMOID;  Surgeon: TOdis Hollingshead MD;  Location: [facility];  Service: General;  Laterality: N/A;  . COLOSTOMY  TAKEDOWN  09/27/2012   Procedure: LAPAROSCOPIC COLOSTOMY TAKEDOWN;  Surgeon: TOdis Hollingshead MD;  Location: [facility];  Service: General;  Laterality: N/A;  Laparoscopic Colostomy Takedown  . EYE SURGERY Bilateral 2018   cataract extraction  . hartmann procedure    . INCISE AND DRAIN ABCESS     sebaceous cyst lanced in office on lt shoulder  . INSERTION OF MESH N/A 11/19/2015   Procedure: INSERTION OF MESH;  Surgeon: TJackolyn Confer MD;  Location: [facility];  Service: General;  Laterality: N/A;  . LAPAROTOMY  12/23/2011   Procedure: EXPLORATORY LAPAROTOMY;  Surgeon: TOdis Hollingshead MD;  Location: [facility];  Service: General;  Laterality: N/A;  . right shoulder surgery      related to trauma   . VENTRAL HERNIA REPAIR N/A 11/19/2015   Procedure: LAPAROSCOPIC LYSIS OF ADHSIONS, VENTRAL HERNIA REPAIR WITH MESH;  Surgeon: TJackolyn Confer MD;  Location: [facility];  Service: General;  Laterality: N/A;   Family History  Problem Relation Age of Onset  . Diabetes Mother   . Cancer Mother     skin  . Heart disease Mother   . Heart attack Mother   . Cancer Father     Leukemia  .  Colon cancer Neg Hx    History  Sexual Activity  . Sexual activity: No    Outpatient Encounter Prescriptions as of 01/20/2017  Medication Sig  . albuterol (PROVENTIL HFA;VENTOLIN HFA) 108 (90 BASE) MCG/ACT inhaler Inhale 2 puffs into the lungs every 6 (six) hours as needed for wheezing or shortness of breath.  . Blood Glucose Monitoring Suppl (ONE TOUCH ULTRA SYSTEM KIT) W/DEVICE KIT 1 kit by Does not apply route once. Use as directed to test blood sugar once daily  Dx:E11.49  . fluticasone (FLONASE) 50 MCG/ACT nasal spray PLACE 2 SPRAYS INTO BOTH NOSTRILS DAILY.  Marland Kitchen gabapentin (NEURONTIN) 400 MG capsule TAKE ONE CAPSULE BY MOUTH 4 TIMES A DAY  . glipiZIDE (GLUCOTROL XL) 5 MG 24 hr tablet Take 2 tablets (10 mg total) by mouth daily with breakfast.  . glucose blood (ONE TOUCH ULTRA TEST) test strip Use 2x a day  . Lancets  (ONETOUCH ULTRASOFT) lancets Use as instructed  . metFORMIN (GLUCOPHAGE) 500 MG tablet TAKE 1 TABLET BY MOUTH 2 TIMES DAILY WITH A MEAL.  . naproxen sodium (ANAPROX) 220 MG tablet Take 220 mg by mouth 2 (two) times daily as needed. For pain  . NONFORMULARY OR COMPOUNDED ITEM Neuropathy cream: mix Amitriptyline 2%, Lidocaine 2%, and Ketamine 1% - apply on feet 2x a day - 60 g container  . Tamsulosin HCl (FLOMAX) 0.4 MG CAPS Take 0.8 mg by mouth at bedtime.   . traMADol (ULTRAM) 50 MG tablet TAKE 1 TABLET BY MOUTH EVERY 6 HOURS AS NEEDED FOR PAIN  . umeclidinium-vilanterol (ANORO ELLIPTA) 62.5-25 MCG/INH AEPB Inhale 1 puff into the lungs daily.  . [DISCONTINUED] doxycycline (VIBRA-TABS) 100 MG tablet TAKE 1 TABLET BY MOUTH TWICE A DAY AS DIRECTED   No facility-administered encounter medications on file as of 01/20/2017.     Activities of Daily Living In your present state of health, do you have any difficulty performing the following activities: 01/20/2017  Hearing? Y  Vision? N  Difficulty concentrating or making decisions? Y  Walking or climbing stairs? N  Dressing or bathing? N  Doing errands, shopping? N  Preparing Food and eating ? N  Using the Toilet? N  In the past six months, have you accidently leaked urine? N  Do you have problems with loss of bowel control? N  Managing your Medications? N  Managing your Finances? N  Housekeeping or managing your Housekeeping? N  Some recent data might be hidden    Patient Care Team: Lucille Passy, MD as PCP - General Jackolyn Confer, MD as Consulting Physician (General Surgery) Philemon Kingdom, MD as Consulting Physician (Internal Medicine) Juluis Rainier as Consulting Physician (Optometry)   Assessment:     Hearing Screening   _0  _1  _2  _3  _4  _5  _6  _7  _8   Right ear:   0 0 0  0    Left ear:   0 0 0  0    Vision Screening Comments: Last vision exam in April 2018 with Dr. Alois Cliche   Exercise Activities  and Dietary recommendations Current Exercise Habits: The patient has a physically strenous job, but has no regular exercise apart from work. (pt walks frequently daily on job), Exercise limited by: None identified  Goals    . Increase physical activity          Starting 01/20/2017, I will continue to walk 2-3 miles daily.        Fall Risk Fall Risk  01/20/2017 01/18/2016 07/09/2015  Falls in  the past year? No No No   Depression Screen PHQ 2/9 Scores 01/20/2017 01/18/2016 07/09/2015  PHQ - 2 Score 0 0 0    Cognitive Function MMSE - Mini Mental State Exam 01/20/2017 01/18/2016  Orientation to time 5 5  Orientation to Place 5 5  Registration 3 3  Attention/ Calculation 0 0  Recall 1 3  Recall-comments pt was unable to recall 1 of 3 words -  Language- name 2 objects 0 0  Language- repeat 1 1  Language- follow 3 step command 3 3  Language- read & follow direction 0 0  Write a sentence 0 0  Copy design 0 0  Total score 18 20       PLEASE NOTE: A Mini-Cog screen was completed. Maximum score is 20. A value of 0 denotes this part of Folstein MMSE was not completed or the patient failed this part of the Mini-Cog screening.   Mini-Cog Screening Orientation to Time - Max 5 pts Orientation to Place - Max 5 pts Registration - Max 3 pts Recall - Max 3 pts Language Repeat - Max 1 pts Language Follow 3 Step Command - Max 3 pts   Immunization History  Administered Date(s) Administered  . Influenza, Seasonal, Injecte, Preservative Fre 09/05/2012  . Pneumococcal Conjugate-13 02/11/2015  . Pneumococcal Polysaccharide-23 09/05/2012  . Tdap 07/08/2014  . Zoster 09/13/2012   Screening Tests Health Maintenance  Topic Date Due  . FOOT EXAM  02/15/2017 (Originally 03/19/2015)  . INFLUENZA VACCINE  07/08/2017 (Originally 05/10/2017)  . HEMOGLOBIN A1C  04/10/2017  . OPHTHALMOLOGY EXAM  07/28/2017  . URINE MICROALBUMIN  01/20/2018  . COLONOSCOPY  04/18/2022  . TETANUS/TDAP  07/08/2024  . PNA  vac Low Risk Adult  Completed      Plan:     I have personally reviewed and addressed the Medicare Annual Wellness questionnaire and have noted the following in the patient's chart:  A. Medical and social history B. Use of alcohol, tobacco or illicit drugs  C. Current medications and supplements D. Functional ability and status E.  Nutritional status F.  Physical activity G. Advance directives H. List of other physicians I.  Hospitalizations, surgeries, and ER visits in previous 12 months J.  [place] to include hearing, vision, cognitive, depression L. Referrals and appointments - none  In addition, I have reviewed and discussed with patient certain preventive protocols, quality metrics, and best practice recommendations. A written personalized care plan for preventive services as well as general preventive health recommendations were provided to patient.  See attached scanned questionnaire for additional information.   Signed,   Lindell Noe, MHA, BS, LPN Health Coach

## 2017-01-20 NOTE — Progress Notes (Signed)
PCP notes:   Health maintenance:  Foot exam - PCP will address at next appt Urine microalbumin - completed  Abnormal screenings:   Hearing - failed Mini-Cog score: 18/20  Patient concerns:   None  Nurse concerns:  None  Next PCP appt:   02/15/17 @ 1000  I reviewed health advisor's note, was available for consultation, and agree with documentation and plan. Loura Pardon MD

## 2017-01-22 ENCOUNTER — Other Ambulatory Visit: Payer: Self-pay | Admitting: Family Medicine

## 2017-02-08 ENCOUNTER — Other Ambulatory Visit: Payer: Self-pay | Admitting: Family Medicine

## 2017-02-09 ENCOUNTER — Ambulatory Visit (INDEPENDENT_AMBULATORY_CARE_PROVIDER_SITE_OTHER): Payer: Medicare Other | Admitting: Internal Medicine

## 2017-02-09 ENCOUNTER — Encounter: Payer: Self-pay | Admitting: Internal Medicine

## 2017-02-09 VITALS — BP 120/74 | HR 84 | Wt 263.0 lb

## 2017-02-09 DIAGNOSIS — E1140 Type 2 diabetes mellitus with diabetic neuropathy, unspecified: Secondary | ICD-10-CM

## 2017-02-09 MED ORDER — GLIPIZIDE ER 5 MG PO TB24: 10.0000 mg | ORAL_TABLET | Freq: Two times a day (BID) | ORAL | 3 refills | Status: AC

## 2017-02-09 MED ORDER — LINAGLIPTIN 5 MG PO TABS: 5.0000 mg | ORAL_TABLET | Freq: Every day | ORAL | 11 refills | Status: AC

## 2017-02-09 NOTE — Telephone Encounter (Signed)
Left refill on voice mail at pharmacy  

## 2017-02-09 NOTE — Telephone Encounter (Signed)
Last filled 01-02-17 #120 Last OV/CPE 01-18-16 Next OV 02-15-17

## 2017-02-09 NOTE — Progress Notes (Signed)
Patient ID: Nicholas [name], male   DOB: 05-18-1941, 76 y.o.   MRN: [id number]  HPI: Nicholas [name] is a 76 y.o.-year-old male, returning for f/u for DM2, dx in 2012-2013, non-insulin-dependent, uncontrolled, with complications (PN, diabetic ulcer). Last visit 3 mo ago.  He was on steroids for sinus infection last month.  Last hemoglobin A1c was: Lab Results  Component Value Date   HGBA1C 7.3 (H) 01/20/2017   HGBA1C 5.9 10/11/2016   HGBA1C 6.3 07/08/2016   Pt is on a regimen of: - Metformin - prev. 1000 mg 2x a day >> dizzy, confusion >> 500 mg 2x a day  - Glipizide ER 5 >> 10 mg in am (before b'fast) >> 10 mg 2x a day increased 3 weeks He was on Tradjenta 5 mg at night (!) (170$ for 1 mo) - started 05/2015.  Pt checks his sugars 2x a day: - am: 107-132 (on steroid:188-241) >> 121-172, 196  - but improving in last 2 weeks: 129-140 - 2h after b'fast: n/c - before lunch: n/c >> 119, 232 >> 155, 161 >> 120, 134 >> 140 >> n/c  - 2h after lunch: n/c >> 162-182, 247 >> 89-144 >> n/c - before dinner: 135-184, 191 >> 85-160, 171 >> last 2 weeks: 142-209 (snacks before dinner: nabs) - 2h after dinner: n/c >> 128 >> 96 >> n/c - bedtime: 137-179, 192, 239 >> 132-199, 208 >> 130-203, 243 (steroids) >> 117-201 - nighttime: n/c No lows. Lowest sugar was 113 >> 100 >> 70x1 >> 97 >> 85 >> 117; he has hypoglycemia awareness at 70.  Highest sugar was 243 >> 244  Glucometer: One Touch Ultra  Pt's meals are: - Breakfast: PB + jelly toast and coffee - Lunch: grilled chicken + bun + fries or veggie plate and tea - Dinner: meat + 2 veggies - Snacks: 4 No sodas (stopped).  - + mild CKD, last BUN/creatinine:  Lab Results  Component Value Date   BUN 16 01/20/2017   CREATININE 1.09 01/20/2017   - last set of lipids: Lab Results  Component Value Date   CHOL 174 01/20/2017   HDL 42.20 01/20/2017   LDLCALC 102 (H) 01/20/2017   LDLDIRECT 110.0 07/08/2016   TRIG 148.0 01/20/2017   CHOLHDL 4 01/20/2017   Not on a statin.  - last eye exam was in 03/2016. No Dr. + Cataracts. Dr. Idolina Primer. Had cataract sx this Spring.  - no numbness and tingling in his feet. On Tramadol, Neurontin 400 mg 3x a day. On compounded neuropathic cream.  He also has a history of OSA, on CPAP.  He developed a L foot Diabetic ulcer early 2017 >> treated with Doxycycline. He then saw wound care >> now healed.    ROS: Constitutional: no weight gain/no weight loss, no fatigue, no subjective hyperthermia, no subjective hypothermia Eyes: no blurry vision, no xerophthalmia ENT: no sore throat, no nodules palpated in throat, no dysphagia, no odynophagia, no hoarseness Cardiovascular: no CP/no SOB/no palpitations/no leg swelling Respiratory: no cough/no SOB/no wheezing Gastrointestinal: no N/no V/no D/no C/no acid reflux Musculoskeletal: no muscle aches/no joint aches Skin: no rashes, no hair loss Neurological: no tremors/no numbness/no tingling/no dizziness  I reviewed pt's medications, allergies, PMH, social hx, family hx, and changes were documented in the history of present illness. Otherwise, unchanged from my initial visit note.  Past Medical History:  Diagnosis Date  . Arthritis   . Blister of foot or toe    left ssecond toe healing per note  of 09/21/12   . BPH (benign prostatic hypertrophy)   . COPD (chronic obstructive pulmonary disease) ([place])   . Descending colostomy s/p reversal 09/27/12 09/28/2012  . Diabetes mellitus    boarderline  . Diabetic foot ulcer ([place]) 09/22/2015  . Diverticulitis of colon with perforation 12/24/2011  . Heart murmur    noted on visit of 09/20/12- [facility]   . Neuropathy   . OSA on CPAP    cpap- doe snot know settings   . Pneumonia    hx. of  . Shortness of breath dyspnea   . Sinus infection    dx 12/13   . Ventral hernia 2013   Past Surgical History:  Procedure Laterality Date  . APPENDECTOMY  12/23/2011   Procedure: APPENDECTOMY;  Surgeon: Odis Hollingshead, MD;  Location:  [facility];  Service: General;  Laterality: N/A;  . bilateral hand surgery      metal placed in hands due to trauma in 2011   . CARDIAC CATHETERIZATION     [facility]  . CHOLECYSTECTOMY    . COLOSTOMY REVISION  12/23/2011   Procedure: COLON RESECTION SIGMOID;  Surgeon: Odis Hollingshead, MD;  Location: [facility];  Service: General;  Laterality: N/A;  . COLOSTOMY TAKEDOWN  09/27/2012   Procedure: LAPAROSCOPIC COLOSTOMY TAKEDOWN;  Surgeon: Odis Hollingshead, MD;  Location: [facility];  Service: General;  Laterality: N/A;  Laparoscopic Colostomy Takedown  . EYE SURGERY Bilateral 2018   cataract extraction  . hartmann procedure    . INCISE AND DRAIN ABCESS     sebaceous cyst lanced in office on lt shoulder  . INSERTION OF MESH N/A 11/19/2015   Procedure: INSERTION OF MESH;  Surgeon: Jackolyn Confer, MD;  [facility]: [facility];  Service: General;  Laterality: N/A;  . LAPAROTOMY  12/23/2011   Procedure: EXPLORATORY LAPAROTOMY;  Surgeon: Odis Hollingshead, MD;  Location: [facility];  Service: General;  Laterality: N/A;  . right shoulder surgery      related to trauma   . VENTRAL HERNIA REPAIR N/A 11/19/2015   Procedure: LAPAROSCOPIC LYSIS OF ADHSIONS, VENTRAL HERNIA REPAIR WITH MESH;  Surgeon: Jackolyn Confer, MD;  Location: [facility];  Service: General;  Laterality: N/A;   Social History   Social History  . Marital Status: Married    Spouse Name: N/A  . Number of Children: 5   Occupational History  . Futures trader    Social History Main Topics  . Smoking status: Former Smoker -- 2.00 packs/day for 40 years    Types: Cigarettes    Quit date: 2008  . Smokeless tobacco: Former Systems developer  . Alcohol Use: No  . Drug Use: No   Current Outpatient Prescriptions on File Prior to Visit  Medication Sig Dispense Refill  . albuterol (PROVENTIL HFA;VENTOLIN HFA) 108 (90 BASE) MCG/ACT inhaler Inhale 2 puffs into the lungs every 6 (six) hours as needed for wheezing or shortness of breath. 1 Inhaler 11  . Blood Glucose  Monitoring Suppl (ONE TOUCH ULTRA SYSTEM KIT) W/DEVICE KIT 1 kit by Does not apply route once. Use as directed to test blood sugar once daily  Dx:E11.49 1 each 0  . fluticasone (FLONASE) 50 MCG/ACT nasal spray PLACE 2 SPRAYS INTO BOTH NOSTRILS DAILY. 50 g 4  . gabapentin (NEURONTIN) 400 MG capsule TAKE ONE CAPSULE BY MOUTH 4 TIMES A DAY 120 capsule 0  . glipiZIDE (GLUCOTROL XL) 5 MG 24 hr tablet Take 2 tablets (10 mg total) by mouth daily with breakfast. 180 tablet 3  .  glucose blood (ONE TOUCH ULTRA TEST) test strip Use 2x a day 200 each 11  . Lancets (ONETOUCH ULTRASOFT) lancets Use as instructed 100 each 3  . metFORMIN (GLUCOPHAGE) 500 MG tablet TAKE 1 TABLET BY MOUTH 2 TIMES DAILY WITH A MEAL. 180 tablet 1  . naproxen sodium (ANAPROX) 220 MG tablet Take 220 mg by mouth 2 (two) times daily as needed. For pain    . NONFORMULARY OR COMPOUNDED ITEM Neuropathy cream: mix Amitriptyline 2%, Lidocaine 2%, and Ketamine 1% - apply on feet 2x a day - 60 g container 1 each 5  . Tamsulosin HCl (FLOMAX) 0.4 MG CAPS Take 0.8 mg by mouth at bedtime.     . traMADol (ULTRAM) 50 MG tablet TAKE 1 TABLET BY MOUTH EVERY 6 HOURS AS NEEDED FOR PAIN 120 tablet 0  . umeclidinium-vilanterol (ANORO ELLIPTA) 62.5-25 MCG/INH AEPB Inhale 1 puff into the lungs daily. 60 each 5  . [DISCONTINUED] hydrochlorothiazide (MICROZIDE) 12.5 MG capsule Take 1 capsule (12.5 mg total) by mouth daily. 30 capsule 0   No current facility-administered medications on file prior to visit.    Allergies  Allergen Reactions  . Sudafed [Pseudoephedrine Hcl]     "makes me feel drunk"   Family History  Problem Relation Age of Onset  . Diabetes Mother   . Cancer Mother     skin  . Heart disease Mother   . Heart attack Mother   . Cancer Father     Leukemia  . Colon cancer Neg Hx    PE: BP 120/74 (BP Location: Left Arm, Patient Position: Sitting)   Pulse 84   Wt 263 lb (119.3 kg)   SpO2 96%   BMI 37.47 kg/m  Wt Readings from Last 3  Encounters:  02/09/17 263 lb (119.3 kg)  01/20/17 258 lb 8 oz (117.3 kg)  10/11/16 258 lb (117 kg)   Constitutional: overweight, in NAD Eyes: PERRLA, EOMI, no exophthalmos ENT: moist mucous membranes, no thyromegaly, no cervical lymphadenopathy Cardiovascular: RRR, + 2/6 SEM, no RG, + mild B LE edema Respiratory: CTA B Respiratory: CTA B Gastrointestinal: abdomen soft, NT, ND, BS+ Musculoskeletal: no deformities, strength intact in all 4 Skin: moist, warm, no rashes Neurological: no tremor with outstretched hands, DTR normal in all 4   ASSESSMENT: 1. DM2, non-insulin-dependent, uncontrolled, with complications - diabetic peripheral neuropathy  2. PN  PLAN:  1. Patient with long-standing, uncontrolled diabetes, with good control on Metformin 500 mg twice a day + Glipizide ER 10 mg 2x a day. Sugars are worse and recent HbA1c was higher >> they are a little better after he increased to Glipizide 2x a day, but still has high CBGs in second half of the day. We discussed about improving snacking.  - will try to add Tradjenta again - I suggested to:  Patient Instructions  Please continue: - Metformin 500 mg 2x a day with meals - Glipizide ER 10 mg 2x a day, before meals  Try to add: - Tradjenta 5 mg before b'fast  Please switch from nabs to unsalted nuts.  Please come back for a follow-up appointment in 3 months.   - continue checking sugars at different times of the day - check 1x a day, rotating checks - advised for yearly eye exams >> he is UTD - Return to clinic in 3 mo with sugar log   2. PN - on the following Neuropathy cream: mix Amitriptyline 2%, Lidocaine 2%, and Ketamine 1% - apply on feet 2x a  day >> feeling better - could not afford Lyrica (300$ per mo) - Continue Neurontin, Tramadol   Philemon Kingdom, MD PhD [facility]

## 2017-02-09 NOTE — Patient Instructions (Addendum)
Please continue: - Metformin 500 mg 2x a day with meals - Glipizide ER 10 mg 2x a day, before meals  Try to add: - Tradjenta 5 mg before b'fast  Please switch from nabs to unsalted nuts.  Please come back for a follow-up appointment in 3 months.

## 2017-02-15 ENCOUNTER — Encounter: Payer: Self-pay | Admitting: Family Medicine

## 2017-02-15 ENCOUNTER — Ambulatory Visit (INDEPENDENT_AMBULATORY_CARE_PROVIDER_SITE_OTHER): Payer: Medicare Other | Admitting: Family Medicine

## 2017-02-15 VITALS — BP 118/70 | HR 86 | Temp 97.7°F | Wt 262.0 lb

## 2017-02-15 DIAGNOSIS — J432 Centrilobular emphysema: Secondary | ICD-10-CM

## 2017-02-15 DIAGNOSIS — E1140 Type 2 diabetes mellitus with diabetic neuropathy, unspecified: Secondary | ICD-10-CM | POA: Diagnosis not present

## 2017-02-15 DIAGNOSIS — N401 Enlarged prostate with lower urinary tract symptoms: Secondary | ICD-10-CM

## 2017-02-15 DIAGNOSIS — E1143 Type 2 diabetes mellitus with diabetic autonomic (poly)neuropathy: Secondary | ICD-10-CM

## 2017-02-15 DIAGNOSIS — E1142 Type 2 diabetes mellitus with diabetic polyneuropathy: Secondary | ICD-10-CM | POA: Diagnosis not present

## 2017-02-15 NOTE — Progress Notes (Signed)
Pre visit review using our clinic review tool, if applicable. No additional management support is needed unless otherwise documented below in the visit note. 

## 2017-02-15 NOTE — Assessment & Plan Note (Signed)
Symptoms stable with flomax.

## 2017-02-15 NOTE — Assessment & Plan Note (Signed)
Followed by endo. No changes made to rxs.

## 2017-02-15 NOTE — Assessment & Plan Note (Signed)
Stable but still symptomatic on current rxs. No changes made.

## 2017-02-15 NOTE — Assessment & Plan Note (Signed)
Stable with current rxs. Followed by pulmonary. No changes made.

## 2017-02-15 NOTE — Progress Notes (Signed)
Subjective:   Patient ID: Nicholas [name], male    DOB: 10/09/1941, 76 y.o.   MRN: [id number]  Nicholas [name] is a pleasant 76 y.o. year old male who presents to clinic today with Medicare Wellness (Part 2. saw Katha Cabal 02-09-17); Arthritis (Alot of pain in hand joints); and Hearing Problem (Seems to be lossing his hearing more lately)  on 02/15/2017  HPI: Saw Candis Musa, RN for medicare wellness visit on 01/20/17- note reviewed.  DM- seeing Dr. Cruzita Lederer.  Was last seen by her on 10/11/16. Note reviewed.  Advised to continue Trajenta, Metformin and Glipizide at current dose. Pos urine micro in 01/2017.   Lab Results  Component Value Date   HGBA1C 7.3 (H) 01/20/2017   Lab Results  Component Value Date   CHOL 174 01/20/2017   HDL 42.20 01/20/2017   LDLCALC 102 (H) 01/20/2017   LDLDIRECT 110.0 07/08/2016   TRIG 148.0 01/20/2017   CHOLHDL 4 01/20/2017   Diabetic neuropathy- remains symptomatic with max dose of gabapentin and tramadol. Lyrica was cost prohibitive.  Emphysema- saw Tammy Parrett on 08/23/16- note reviewed. Compliant with inhalers. Current Outpatient Prescriptions on File Prior to Visit  Medication Sig Dispense Refill  . albuterol (PROVENTIL HFA;VENTOLIN HFA) 108 (90 BASE) MCG/ACT inhaler Inhale 2 puffs into the lungs every 6 (six) hours as needed for wheezing or shortness of breath. 1 Inhaler 11  . Blood Glucose Monitoring Suppl (ONE TOUCH ULTRA SYSTEM KIT) W/DEVICE KIT 1 kit by Does not apply route once. Use as directed to test blood sugar once daily  Dx:E11.49 1 each 0  . fluticasone (FLONASE) 50 MCG/ACT nasal spray PLACE 2 SPRAYS INTO BOTH NOSTRILS DAILY. 50 g 4  . gabapentin (NEURONTIN) 400 MG capsule TAKE ONE CAPSULE BY MOUTH 4 TIMES A DAY 120 capsule 0  . glipiZIDE (GLUCOTROL XL) 5 MG 24 hr tablet Take 2 tablets (10 mg total) by mouth 2 (two) times daily before a meal. 360 tablet 3  . glucose blood (ONE TOUCH ULTRA TEST) test strip Use 2x a day 200 each 11  . Lancets  (ONETOUCH ULTRASOFT) lancets Use as instructed 100 each 3  . linagliptin (TRADJENTA) 5 MG TABS tablet Take 1 tablet (5 mg total) by mouth daily before breakfast. 30 tablet 11  . metFORMIN (GLUCOPHAGE) 500 MG tablet TAKE 1 TABLET BY MOUTH 2 TIMES DAILY WITH A MEAL. 180 tablet 1  . naproxen sodium (ANAPROX) 220 MG tablet Take 220 mg by mouth 2 (two) times daily as needed. For pain    . Tamsulosin HCl (FLOMAX) 0.4 MG CAPS Take 0.8 mg by mouth at bedtime.     . traMADol (ULTRAM) 50 MG tablet TAKE 1 TABLET BY MOUTH EVERY 6 HOURS AS NEEDED FOR PAIN 120 tablet 0  . umeclidinium-vilanterol (ANORO ELLIPTA) 62.5-25 MCG/INH AEPB Inhale 1 puff into the lungs daily. 60 each 5  . [DISCONTINUED] hydrochlorothiazide (MICROZIDE) 12.5 MG capsule Take 1 capsule (12.5 mg total) by mouth daily. 30 capsule 0   No current facility-administered medications on file prior to visit.     Allergies  Allergen Reactions  . Sudafed [Pseudoephedrine Hcl]     "makes me feel drunk"    Past Medical History:  Diagnosis Date  . Arthritis   . Blister of foot or toe    left ssecond toe healing per note of 09/21/12   . BPH (benign prostatic hypertrophy)   . COPD (chronic obstructive pulmonary disease) ([place])   . Descending colostomy s/p reversal 09/27/12  09/28/2012  . Diabetes mellitus    boarderline  . Diabetic foot ulcer ([place]) 09/22/2015  . Diverticulitis of colon with perforation 12/24/2011  . Heart murmur    noted on visit of 09/20/12- [facility]   . Neuropathy   . OSA on CPAP    cpap- doe snot know settings   . Pneumonia    hx. of  . Shortness of breath dyspnea   . Sinus infection    dx 12/13   . Ventral hernia 2013    Past Surgical History:  Procedure Laterality Date  . APPENDECTOMY  12/23/2011   Procedure: APPENDECTOMY;  Surgeon: Odis Hollingshead, MD;  Location: [facility];  Service: General;  Laterality: N/A;  . bilateral hand surgery      metal placed in hands due to trauma in 2011   . CARDIAC CATHETERIZATION      [facility]  . CHOLECYSTECTOMY    . COLOSTOMY REVISION  12/23/2011   Procedure: COLON RESECTION SIGMOID;  Surgeon: Odis Hollingshead, MD;  [facility];  Service: General;  Laterality: N/A;  . COLOSTOMY TAKEDOWN  09/27/2012   Procedure: LAPAROSCOPIC COLOSTOMY TAKEDOWN;  Surgeon: Odis Hollingshead, MD;  Location: [facility];  Service: General;  Laterality: N/A;  Laparoscopic Colostomy Takedown  . EYE SURGERY Bilateral 2018   cataract extraction  . hartmann procedure    . INCISE AND DRAIN ABCESS     sebaceous cyst lanced in office on lt shoulder  . INSERTION OF MESH N/A 11/19/2015   Procedure: INSERTION OF MESH;  Surgeon: Jackolyn Confer, MD;  [facility];  Service: General;  Laterality: N/A;  . LAPAROTOMY  12/23/2011   Procedure: EXPLORATORY LAPAROTOMY;  Surgeon: Odis Hollingshead, MD;  [facility];  Service: General;  Laterality: N/A;  . right shoulder surgery      related to trauma   . VENTRAL HERNIA REPAIR N/A 11/19/2015   Procedure: LAPAROSCOPIC LYSIS OF ADHSIONS, VENTRAL HERNIA REPAIR WITH MESH;  Surgeon: Jackolyn Confer, MD;  Location[facility];  Service: General;  Laterality: N/A;    Family History  Problem Relation Age of Onset  . Diabetes Mother   . Cancer Mother     skin  . Heart disease Mother   . Heart attack Mother   . Cancer Father     Leukemia  . Colon cancer Neg Hx     Social History   Social History  . Marital status: Married    Spouse name: N/A  . Number of children: N/A  . Years of education: N/A   Occupational History  . Futures trader Retired   Social History Main Topics  . Smoking status: Former Smoker    Packs/day: 2.00    Years: 40.00    Types: Cigarettes    Quit date: 10/10/1993  . Smokeless tobacco: Former Systems developer  . Alcohol use No  . Drug use: No  . Sexual activity: No   Other Topics Concern  . Not on file   Social History Narrative  . No narrative on file   The PMH, PSH, Social History, Family History, Medications, and allergies  have been reviewed in [facility], and have been updated if relevant.  Review of Systems  Constitutional: Negative.   HENT: Negative.   Eyes: Negative.   Respiratory: Negative.   Cardiovascular: Negative.   Gastrointestinal: Negative.   Endocrine: Negative.   Genitourinary: Negative.   Musculoskeletal: Negative.   Allergic/Immunologic: Negative.   Neurological: Positive for numbness. Negative for dizziness, facial asymmetry, light-headedness  and headaches.  Hematological: Negative.   Psychiatric/Behavioral: Negative.   All other systems reviewed and are negative.      Objective:    BP 118/70 (BP Location: Left Arm, Patient Position: Sitting, Cuff Size: Large)   Pulse 86   Temp 97.7 F (36.5 C) (Oral)   Wt 262 lb (118.8 kg)   SpO2 95%   BMI 37.33 kg/m    Physical Exam  General:  pleasant male in no acute distress Eyes:  PERRL Ears:  External ear exam shows no significant lesions or deformities.  TMs normal bilaterally Hearing is grossly normal bilaterally. Nose:  External nasal examination shows no deformity or inflammation. Nasal mucosa are pink and moist without lesions or exudates. Mouth:  Oral mucosa and oropharynx without lesions or exudates.  Teeth in good repair. Neck:  no carotid bruit or thyromegaly no cervical or supraclavicular lymphadenopathy  Lungs:  Normal respiratory effort, chest expands symmetrically. Lungs are clear to auscultation, no crackles or wheezes. Heart:  Normal rate and regular rhythm. S1 and S2 normal without gallop, murmur, click, rub or other extra sounds. Abdomen:  Bowel sounds positive,abdomen soft and non-tender without masses, organomegaly or hernias noted. Pulses:  R and L posterior tibial pulses are full and equal bilaterally  Extremities:  no edema  Psych:  Good eye contact, not anxious or depressed appearing      Assessment & Plan:   Peripheral sensory neuropathy due to type 2 diabetes mellitus (HCC)  Diabetic autonomic neuropathy  associated with type 2 diabetes mellitus (HCC)  Benign prostatic hyperplasia with lower urinary tract symptoms, symptom details unspecified  Centrilobular emphysema (HCC)  Type 2 diabetes mellitus with diabetic neuropathy, without long-term current use of insulin ([place]) No Follow-up on file.

## 2017-02-23 ENCOUNTER — Other Ambulatory Visit: Payer: Self-pay | Admitting: Family Medicine

## 2017-02-23 NOTE — Telephone Encounter (Signed)
Last refill 01/23/17 #120, last OV 02/15/17.

## 2017-03-02 DIAGNOSIS — H20051 Hypopyon, right eye: Secondary | ICD-10-CM | POA: Diagnosis not present

## 2017-03-07 DIAGNOSIS — H20051 Hypopyon, right eye: Secondary | ICD-10-CM | POA: Diagnosis not present

## 2017-03-08 DIAGNOSIS — J209 Acute bronchitis, unspecified: Secondary | ICD-10-CM | POA: Diagnosis not present

## 2017-03-08 DIAGNOSIS — B9689 Other specified bacterial agents as the cause of diseases classified elsewhere: Secondary | ICD-10-CM | POA: Diagnosis not present

## 2017-03-08 DIAGNOSIS — J019 Acute sinusitis, unspecified: Secondary | ICD-10-CM | POA: Diagnosis not present

## 2017-03-09 ENCOUNTER — Other Ambulatory Visit: Payer: Self-pay | Admitting: Family Medicine

## 2017-03-09 NOTE — Telephone Encounter (Signed)
Last refill 02/09/17 #120 Last OV 02/15/17

## 2017-03-14 ENCOUNTER — Other Ambulatory Visit: Payer: Self-pay | Admitting: Family Medicine

## 2017-03-14 NOTE — Telephone Encounter (Signed)
rx was approved on 03/09/17 it hasnt' been filled since 02/09/17 per pharmacist at [facility], [place].

## 2017-03-16 DIAGNOSIS — H20051 Hypopyon, right eye: Secondary | ICD-10-CM | POA: Diagnosis not present

## 2017-03-23 ENCOUNTER — Encounter: Payer: Self-pay | Admitting: Family Medicine

## 2017-03-23 ENCOUNTER — Ambulatory Visit (INDEPENDENT_AMBULATORY_CARE_PROVIDER_SITE_OTHER): Payer: Medicare Other | Admitting: Family Medicine

## 2017-03-23 ENCOUNTER — Ambulatory Visit (INDEPENDENT_AMBULATORY_CARE_PROVIDER_SITE_OTHER)
Admission: RE | Admit: 2017-03-23 | Discharge: 2017-03-23 | Disposition: A | Discharge status: Home / Self Care | Payer: Medicare Other | Source: Ambulatory Visit | Attending: Family Medicine | Admitting: Family Medicine

## 2017-03-23 VITALS — BP 128/74 | HR 100 | Temp 98.3°F | Wt 247.0 lb

## 2017-03-23 DIAGNOSIS — M542 Cervicalgia: Secondary | ICD-10-CM

## 2017-03-23 MED ORDER — OXYCODONE-ACETAMINOPHEN 5-325 MG PO TABS: 1.0000 | ORAL_TABLET | Freq: Three times a day (TID) | ORAL | 0 refills | Status: DC | PRN

## 2017-03-23 NOTE — Progress Notes (Signed)
SUBJECTIVE:  Nicholas [name] is a 76 y.o. male who complains of worsening neck pain for 3 week(s). The pain is positional with movement of neck without radiation of pain down the arms. Mechanism of injury: non known injury but so painful he is losing weight- no appetite  Tramadol not helping.  Has been to chiropractor 3 times and pain is only getting worse .  Symptoms have been constant since that time. Prior history of neck problems: no prior neck problems. There is no numbness, tingling, weakness in the arms.  Current Outpatient Prescriptions on File Prior to Visit  Medication Sig Dispense Refill  . albuterol (PROVENTIL HFA;VENTOLIN HFA) 108 (90 BASE) MCG/ACT inhaler Inhale 2 puffs into the lungs every 6 (six) hours as needed for wheezing or shortness of breath. 1 Inhaler 11  . Blood Glucose Monitoring Suppl (ONE TOUCH ULTRA SYSTEM KIT) W/DEVICE KIT 1 kit by Does not apply route once. Use as directed to test blood sugar once daily  Dx:E11.49 1 each 0  . fluticasone (FLONASE) 50 MCG/ACT nasal spray PLACE 2 SPRAYS INTO BOTH NOSTRILS DAILY. 50 g 4  . gabapentin (NEURONTIN) 400 MG capsule TAKE ONE CAPSULE BY MOUTH 4 TIMES A DAY 120 capsule 0  . glipiZIDE (GLUCOTROL XL) 5 MG 24 hr tablet Take 2 tablets (10 mg total) by mouth 2 (two) times daily before a meal. 360 tablet 3  . glucose blood (ONE TOUCH ULTRA TEST) test strip Use 2x a day 200 each 11  . Lancets (ONETOUCH ULTRASOFT) lancets Use as instructed 100 each 3  . linagliptin (TRADJENTA) 5 MG TABS tablet Take 1 tablet (5 mg total) by mouth daily before breakfast. 30 tablet 11  . metFORMIN (GLUCOPHAGE) 500 MG tablet TAKE 1 TABLET BY MOUTH 2 TIMES DAILY WITH A MEAL. 180 tablet 1  . naproxen sodium (ANAPROX) 220 MG tablet Take 220 mg by mouth 2 (two) times daily as needed. For pain    . Tamsulosin HCl (FLOMAX) 0.4 MG CAPS Take 0.8 mg by mouth at bedtime.     . traMADol (ULTRAM) 50 MG tablet TAKE 1 TABLET BY MOUTH EVERY 6 HOURS AS NEEDED FOR PAIN 120 tablet  0  . umeclidinium-vilanterol (ANORO ELLIPTA) 62.5-25 MCG/INH AEPB Inhale 1 puff into the lungs daily. 60 each 5  . [DISCONTINUED] hydrochlorothiazide (MICROZIDE) 12.5 MG capsule Take 1 capsule (12.5 mg total) by mouth daily. 30 capsule 0   No current facility-administered medications on file prior to visit.     Allergies  Allergen Reactions  . Sudafed [Pseudoephedrine Hcl]     "makes me feel drunk"    Past Medical History:  Diagnosis Date  . Arthritis   . Blister of foot or toe    left ssecond toe healing per note of 09/21/12   . BPH (benign prostatic hypertrophy)   . COPD (chronic obstructive pulmonary disease) ([place])   . Descending colostomy s/p reversal 09/27/12 09/28/2012  . Diabetes mellitus    boarderline  . Diabetic foot ulcer ([place]) 09/22/2015  . Diverticulitis of colon with perforation 12/24/2011  . Heart murmur    noted on visit of 09/20/12- [facility]   . Neuropathy   . OSA on CPAP    cpap- doe snot know settings   . Pneumonia    hx. of  . Shortness of breath dyspnea   . Sinus infection    dx 12/13   . Ventral hernia 2013    Past Surgical History:  Procedure Laterality Date  . APPENDECTOMY  12/23/2011   Procedure: APPENDECTOMY;  Surgeon: Odis Hollingshead, MD;  Location: [facility];  Service: General;  Laterality: N/A;  . bilateral hand surgery      metal placed in hands due to trauma in 2011   . CARDIAC CATHETERIZATION     [facility]  . CHOLECYSTECTOMY    . COLOSTOMY REVISION  12/23/2011   Procedure: COLON RESECTION SIGMOID;  Surgeon: Odis Hollingshead, MD;  Location: [facility];  Service: General;  Laterality: N/A;  . COLOSTOMY TAKEDOWN  09/27/2012   Procedure: LAPAROSCOPIC COLOSTOMY TAKEDOWN;  Surgeon: Odis Hollingshead, MD;  Location: [facility];  Service: General;  Laterality: N/A;  Laparoscopic Colostomy Takedown  . EYE SURGERY Bilateral 2018   cataract extraction  . hartmann procedure    . INCISE AND DRAIN ABCESS     sebaceous cyst lanced in office on lt shoulder  .  INSERTION OF MESH N/A 11/19/2015   Procedure: INSERTION OF MESH;  Surgeon: Jackolyn Confer, MD;  [facility];  Service: General;  Laterality: N/A;  . LAPAROTOMY  12/23/2011   Procedure: EXPLORATORY LAPAROTOMY;  Surgeon: Odis Hollingshead, MD;  Location[facility];  Service: General;  Laterality: N/A;  . right shoulder surgery      related to trauma   . VENTRAL HERNIA REPAIR N/A 11/19/2015   Procedure: LAPAROSCOPIC LYSIS OF ADHSIONS, VENTRAL HERNIA REPAIR WITH MESH;  Surgeon: Jackolyn Confer, MD;  Location[facility];  Service: General;  Laterality: N/A;    Family History  Problem Relation Age of Onset  . Diabetes Mother   . Cancer Mother        skin  . Heart disease Mother   . Heart attack Mother   . Cancer Father        Leukemia  . Colon cancer Neg Hx     Social History   Social History  . Marital status: Married    Spouse name: N/A  . Number of children: N/A  . Years of education: N/A   Occupational History  . Futures trader Retired   Social History Main Topics  . Smoking status: Former Smoker    Packs/day: 2.00    Years: 40.00    Types: Cigarettes    Quit date: 10/10/1993  . Smokeless tobacco: Former Systems developer  . Alcohol use No  . Drug use: No  . Sexual activity: No   Other Topics Concern  . Not on file   Social History Narrative  . No narrative on file   The PMH, PSH, Social History, Family History, Medications, and allergies have been reviewed in [facility], and have been updated if relevant.  OBJECTIVE: BP 128/74   Pulse 100   Temp 98.3 F (36.8 C)   Wt 247 lb (112 kg)   SpO2 95%   BMI 35.19 kg/m   Wt Readings from Last 3 Encounters:  03/23/17 247 lb (112 kg)  02/15/17 262 lb (118.8 kg)  02/09/17 263 lb (119.3 kg)    Vital signs as noted above. Patient appears to be in mild to moderate pain.  Neck exam: reduced painful C-spine range of motion, normal neurological exam of arms; normal DTR's, motor, sensory exam. X-Ray: ordered, but results not yet  available.  ASSESSMENT:  cervical strain and underlying chronic DDD likely  PLAN: rest the injured area as much as practical, X-Ray ordered, referral to Orthopedics for this injury Consider Physical Therapy and XRay studies if not improving.  Call or return to clinic prn if these symptoms worsen or  fail to improve as anticipated.

## 2017-03-24 DIAGNOSIS — M542 Cervicalgia: Secondary | ICD-10-CM | POA: Diagnosis not present

## 2017-03-27 DIAGNOSIS — M542 Cervicalgia: Secondary | ICD-10-CM | POA: Diagnosis not present

## 2017-03-29 DIAGNOSIS — M542 Cervicalgia: Secondary | ICD-10-CM | POA: Diagnosis not present

## 2017-03-31 DIAGNOSIS — M542 Cervicalgia: Secondary | ICD-10-CM | POA: Diagnosis not present

## 2017-04-03 DIAGNOSIS — H20051 Hypopyon, right eye: Secondary | ICD-10-CM | POA: Diagnosis not present

## 2017-04-03 DIAGNOSIS — M542 Cervicalgia: Secondary | ICD-10-CM | POA: Diagnosis not present

## 2017-04-05 DIAGNOSIS — M542 Cervicalgia: Secondary | ICD-10-CM | POA: Diagnosis not present

## 2017-04-07 DIAGNOSIS — H43813 Vitreous degeneration, bilateral: Secondary | ICD-10-CM | POA: Diagnosis not present

## 2017-04-07 DIAGNOSIS — H353132 Nonexudative age-related macular degeneration, bilateral, intermediate dry stage: Secondary | ICD-10-CM | POA: Diagnosis not present

## 2017-04-07 DIAGNOSIS — H20041 Secondary noninfectious iridocyclitis, right eye: Secondary | ICD-10-CM | POA: Diagnosis not present

## 2017-04-10 DIAGNOSIS — M542 Cervicalgia: Secondary | ICD-10-CM | POA: Diagnosis not present

## 2017-04-13 ENCOUNTER — Other Ambulatory Visit: Payer: Self-pay | Admitting: Family Medicine

## 2017-04-13 DIAGNOSIS — M542 Cervicalgia: Secondary | ICD-10-CM | POA: Diagnosis not present

## 2017-04-13 NOTE — Telephone Encounter (Signed)
Last refill 03/09/17  Last OV 03/23/17  Ok to refill?

## 2017-04-13 NOTE — Telephone Encounter (Signed)
Faxed to [id number] - [place], [place] - [address] [facility] [facility]  [address] [place] [address]  Phone: [phone] Fax: [phone]    

## 2017-04-17 DIAGNOSIS — M542 Cervicalgia: Secondary | ICD-10-CM | POA: Diagnosis not present

## 2017-04-19 DIAGNOSIS — M542 Cervicalgia: Secondary | ICD-10-CM | POA: Diagnosis not present

## 2017-04-28 DIAGNOSIS — M542 Cervicalgia: Secondary | ICD-10-CM | POA: Diagnosis not present

## 2017-05-05 DIAGNOSIS — H20041 Secondary noninfectious iridocyclitis, right eye: Secondary | ICD-10-CM | POA: Diagnosis not present

## 2017-05-05 DIAGNOSIS — H43813 Vitreous degeneration, bilateral: Secondary | ICD-10-CM | POA: Diagnosis not present

## 2017-05-10 DIAGNOSIS — M47812 Spondylosis without myelopathy or radiculopathy, cervical region: Secondary | ICD-10-CM | POA: Diagnosis not present

## 2017-05-11 ENCOUNTER — Other Ambulatory Visit: Payer: Self-pay | Admitting: Family Medicine

## 2017-05-12 NOTE — Telephone Encounter (Signed)
Last refill 04/13/17 Last OV 03/23/17 Ok to refill?

## 2017-05-12 NOTE — Telephone Encounter (Signed)
Rx called in to requested pharmacy 

## 2017-05-26 ENCOUNTER — Ambulatory Visit: Payer: Medicare Other | Admitting: Internal Medicine

## 2017-06-01 ENCOUNTER — Ambulatory Visit: Payer: Medicare Other | Admitting: Internal Medicine

## 2017-06-13 ENCOUNTER — Other Ambulatory Visit: Payer: Self-pay | Admitting: Family Medicine

## 2017-06-15 NOTE — Telephone Encounter (Signed)
Rx tramadol faxed to [facility]

## 2017-06-15 NOTE — Telephone Encounter (Signed)
Last filled 05-12-17 #120 Last OV 03-23-17 Next OV 01-29-18

## 2017-06-20 DIAGNOSIS — H43813 Vitreous degeneration, bilateral: Secondary | ICD-10-CM | POA: Diagnosis not present

## 2017-06-20 DIAGNOSIS — H20041 Secondary noninfectious iridocyclitis, right eye: Secondary | ICD-10-CM | POA: Diagnosis not present

## 2017-06-20 DIAGNOSIS — H353132 Nonexudative age-related macular degeneration, bilateral, intermediate dry stage: Secondary | ICD-10-CM | POA: Diagnosis not present

## 2017-06-20 DIAGNOSIS — H35371 Puckering of macula, right eye: Secondary | ICD-10-CM | POA: Diagnosis not present

## 2017-06-21 DIAGNOSIS — M47812 Spondylosis without myelopathy or radiculopathy, cervical region: Secondary | ICD-10-CM | POA: Diagnosis not present

## 2017-06-22 ENCOUNTER — Other Ambulatory Visit: Payer: Self-pay | Admitting: Internal Medicine

## 2017-06-29 ENCOUNTER — Other Ambulatory Visit: Payer: Self-pay | Admitting: Internal Medicine

## 2017-06-30 DIAGNOSIS — H20051 Hypopyon, right eye: Secondary | ICD-10-CM | POA: Diagnosis not present

## 2017-06-30 DIAGNOSIS — H44001 Unspecified purulent endophthalmitis, right eye: Secondary | ICD-10-CM | POA: Diagnosis not present

## 2017-06-30 DIAGNOSIS — H43811 Vitreous degeneration, right eye: Secondary | ICD-10-CM | POA: Diagnosis not present

## 2017-07-01 DIAGNOSIS — H20051 Hypopyon, right eye: Secondary | ICD-10-CM | POA: Diagnosis not present

## 2017-07-01 DIAGNOSIS — H44001 Unspecified purulent endophthalmitis, right eye: Secondary | ICD-10-CM | POA: Diagnosis not present

## 2017-07-05 DIAGNOSIS — H44001 Unspecified purulent endophthalmitis, right eye: Secondary | ICD-10-CM | POA: Diagnosis not present

## 2017-07-05 DIAGNOSIS — H43811 Vitreous degeneration, right eye: Secondary | ICD-10-CM | POA: Diagnosis not present

## 2017-07-10 ENCOUNTER — Ambulatory Visit: Payer: Medicare Other | Admitting: Family Medicine

## 2017-07-11 ENCOUNTER — Ambulatory Visit (INDEPENDENT_AMBULATORY_CARE_PROVIDER_SITE_OTHER): Payer: Medicare Other | Admitting: Family Medicine

## 2017-07-11 ENCOUNTER — Encounter: Payer: Self-pay | Admitting: Family Medicine

## 2017-07-11 ENCOUNTER — Other Ambulatory Visit: Payer: Self-pay | Admitting: Family Medicine

## 2017-07-11 DIAGNOSIS — H43391 Other vitreous opacities, right eye: Secondary | ICD-10-CM | POA: Diagnosis not present

## 2017-07-11 DIAGNOSIS — J309 Allergic rhinitis, unspecified: Secondary | ICD-10-CM | POA: Diagnosis not present

## 2017-07-11 DIAGNOSIS — D492 Neoplasm of unspecified behavior of bone, soft tissue, and skin: Secondary | ICD-10-CM | POA: Diagnosis not present

## 2017-07-11 DIAGNOSIS — H44001 Unspecified purulent endophthalmitis, right eye: Secondary | ICD-10-CM | POA: Diagnosis not present

## 2017-07-11 DIAGNOSIS — H43813 Vitreous degeneration, bilateral: Secondary | ICD-10-CM | POA: Diagnosis not present

## 2017-07-11 DIAGNOSIS — H353132 Nonexudative age-related macular degeneration, bilateral, intermediate dry stage: Secondary | ICD-10-CM | POA: Diagnosis not present

## 2017-07-11 NOTE — Assessment & Plan Note (Signed)
Deteriorated. Does not seem consistent with bacterial infection. Advised flonase and daily antihistamine. Call or return to clinic prn if these symptoms worsen or fail to improve as anticipated. The patient indicates understanding of these issues and agrees with the plan.

## 2017-07-11 NOTE — Patient Instructions (Signed)
Great to see you.  Ask your pharmacist for a wart treatment pads (salicylic acid).  I also want you restart flonase with daily claritin or zyrtec.

## 2017-07-11 NOTE — Assessment & Plan Note (Signed)
?   Wart as it started out much smaller with similar appearance- see AVS- advised OTC wart pads. Consider cryotherapy once area is a bit smaller. Call or return to clinic prn if these symptoms worsen or fail to improve as anticipated. The patient indicates understanding of these issues and agrees with the plan.

## 2017-07-11 NOTE — Progress Notes (Signed)
Subjective:   Patient ID: Nicholas [name], male    DOB: 06/19/1941, 76 y.o.   MRN: [id number]  Nicholas [name] is a pleasant 76 y.o. year old male who presents to clinic today with Mass (on left elbow ) and Sinusitis Nelda Bucks )  on 07/11/2017  HPI:  Mass on left elbow- growing in size and "flaking up" over the past year. 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 No erythema or warmth.  No drainage.   ?sinusitis- For weeks, worsening drainage from his nose- clear. No sinus pressure. No tooth pain. No fevers. No cough.  No SOB.    Current Outpatient Prescriptions on File Prior to Visit  Medication Sig Dispense Refill  . albuterol (PROVENTIL HFA;VENTOLIN HFA) 108 (90 BASE) MCG/ACT inhaler Inhale 2 puffs into the lungs every 6 (six) hours as needed for wheezing or shortness of breath. 1 Inhaler 11  . ANORO ELLIPTA 62.5-25 MCG/INH AEPB INHALE 1 PUFF INTO THE LUNGS DAILY. 60 each 0  . Blood Glucose Monitoring Suppl (ONE TOUCH ULTRA SYSTEM KIT) W/DEVICE KIT 1 kit by Does not apply route once. Use as directed to test blood sugar once daily  Dx:E11.49 1 each 0  . fluticasone (FLONASE) 50 MCG/ACT nasal spray PLACE 2 SPRAYS INTO BOTH NOSTRILS DAILY. 50 g 4  . gabapentin (NEURONTIN) 400 MG capsule TAKE ONE CAPSULE BY MOUTH 4 TIMES A DAY 120 capsule 0  . glipiZIDE (GLUCOTROL XL) 5 MG 24 hr tablet Take 2 tablets (10 mg total) by mouth 2 (two) times daily before a meal. 360 tablet 3  . glucose blood (ONE TOUCH ULTRA TEST) test strip Use 2x a day 200 each 11  . Lancets (ONETOUCH ULTRASOFT) lancets Use as instructed 100 each 3  . linagliptin (TRADJENTA) 5 MG TABS tablet Take 1 tablet (5 mg total) by mouth daily before breakfast. 30 tablet 11  . metFORMIN (GLUCOPHAGE) 500 MG tablet TAKE 1 TABLET BY MOUTH 2 TIMES DAILY WITH A MEAL. 180 tablet 1  . naproxen sodium (ANAPROX) 220 MG tablet Take 220 mg by mouth 2 (two) times daily as needed. For pain    . Tamsulosin HCl  (FLOMAX) 0.4 MG CAPS Take 0.8 mg by mouth at bedtime.     . traMADol (ULTRAM) 50 MG tablet TAKE 1 TABLET BY MOUTH EVERY 6 HOURS AS NEEDED FOR PAIN 120 tablet 0  . [DISCONTINUED] hydrochlorothiazide (MICROZIDE) 12.5 MG capsule Take 1 capsule (12.5 mg total) by mouth daily. 30 capsule 0   No current facility-administered medications on file prior to visit.     Allergies  Allergen Reactions  . Sudafed [Pseudoephedrine Hcl]     "makes me feel drunk"    Past Medical History:  Diagnosis Date  . Arthritis   . Blister of foot or toe    left ssecond toe healing per note of 09/21/12   . BPH (benign prostatic hypertrophy)   . COPD (chronic obstructive pulmonary disease) ([place])   . Descending colostomy s/p reversal 09/27/12 09/28/2012  . Diabetes mellitus    boarderline  . Diabetic foot ulcer ([place]) 09/22/2015  . Diverticulitis of colon with perforation 12/24/2011  . Heart murmur    noted on visit of 09/20/12- [facility]   . Neuropathy   . OSA on CPAP    cpap- doe snot know settings   . Pneumonia    hx. of  . Shortness of breath dyspnea   . Sinus infection  dx 12/13   . Ventral hernia 2013    Past Surgical History:  Procedure Laterality Date  . APPENDECTOMY  12/23/2011   Procedure: APPENDECTOMY;  Surgeon: Adolph Pollack, MD;  [facility];  Service: General;  Laterality: N/A;  . bilateral hand surgery      metal placed in hands due to trauma in 2011   . CARDIAC CATHETERIZATION     [facility]  . CHOLECYSTECTOMY    . COLOSTOMY REVISION  12/23/2011   Procedure: COLON RESECTION SIGMOID;  Surgeon: Adolph Pollack, MD;  [facility];  Service: General;  Laterality: N/A;  . COLOSTOMY TAKEDOWN  09/27/2012   Procedure: LAPAROSCOPIC COLOSTOMY TAKEDOWN;  Surgeon: Adolph Pollack, MD;  [facility];  Service: General;  Laterality: N/A;  Laparoscopic Colostomy Takedown  . EYE SURGERY Bilateral 2018   cataract extraction  . hartmann procedure    . INCISE AND DRAIN ABCESS     sebaceous  cyst lanced in office on lt shoulder  . INSERTION OF MESH N/A 11/19/2015   Procedure: INSERTION OF MESH;  Surgeon: Avel Peace, MD;  [facility];  Service: General;  Laterality: N/A;  . LAPAROTOMY  12/23/2011   Procedure: EXPLORATORY LAPAROTOMY;  Surgeon: Adolph Pollack, MD;  [facility];  Service: General;  Laterality: N/A;  . right shoulder surgery      related to trauma   . VENTRAL HERNIA REPAIR N/A 11/19/2015   Procedure: LAPAROSCOPIC LYSIS OF ADHSIONS, VENTRAL HERNIA REPAIR WITH MESH;  Surgeon: Avel Peace, MD;  [facility];  Service: General;  Laterality: N/A;    Family History  Problem Relation Age of Onset  . Diabetes Mother   . Cancer Mother        skin  . Heart disease Mother   . Heart attack Mother   . Cancer Father        Leukemia  . Colon cancer Neg Hx     Social History   Social History  . Marital status: Married    Spouse name: N/A  . Number of children: N/A  . Years of education: N/A   Occupational History  . Games developer Retired   Social History Main Topics  . Smoking status: Former Smoker    Packs/day: 2.00    Years: 40.00    Types: Cigarettes    Quit date: 10/10/1993  . Smokeless tobacco: Former Neurosurgeon  . Alcohol use No  . Drug use: No  . Sexual activity: No   Other Topics Concern  . Not on file   Social History Narrative  . No narrative on file   The PMH, PSH, Social History, Family History, Medications, and allergies have been reviewed in [facility], and have been updated if relevant.   Review of Systems  Constitutional: Negative for fever.  HENT: Positive for congestion, postnasal drip and rhinorrhea. Negative for dental problem, drooling, ear discharge, ear pain, facial swelling, hearing loss, mouth sores, nosebleeds, sinus pain, sinus pressure, sneezing, sore throat, tinnitus, trouble swallowing and voice change.   Skin: Positive for wound.  All other systems reviewed and are negative.      Objective:    BP  138/68 (BP Location: Right Arm, Patient Position: Sitting, Cuff Size: Large)   Pulse 68   Temp 98 F (36.7 C) (Oral)   Wt 263 lb 4 oz (119.4 kg)   SpO2 97%   BMI 37.50 kg/m    Physical Exam  Constitutional: He is oriented to person, place, and  time. He appears well-developed and well-nourished. No distress.  HENT:  Head: Normocephalic and atraumatic.  Right Ear: Hearing and tympanic membrane normal.  Left Ear: Hearing and tympanic membrane normal.  Nose: Rhinorrhea present. No sinus tenderness. Right sinus exhibits no maxillary sinus tenderness and no frontal sinus tenderness. Left sinus exhibits no maxillary sinus tenderness and no frontal sinus tenderness.  Cardiovascular: Normal rate.   Pulmonary/Chest: Effort normal and breath sounds normal.  Musculoskeletal: Normal range of motion.  Neurological: He is alert and oriented to person, place, and time. No cranial nerve deficit.  Skin: He is not diaphoretic.     Psychiatric: He has a normal mood and affect. His behavior is normal. Thought content normal.  Nursing note and vitals reviewed.         Assessment & Plan:   Allergic rhinitis, unspecified seasonality, unspecified trigger  Skin growth No Follow-up on file.

## 2017-07-12 NOTE — Telephone Encounter (Signed)
Last refill 06/15/17  Last OV YESTERDAY  Ok to refill?

## 2017-07-12 NOTE — Telephone Encounter (Signed)
[id number] [place], [place] - [place] Phone: [phone]

## 2017-07-18 ENCOUNTER — Other Ambulatory Visit: Payer: Self-pay | Admitting: Adult Health

## 2017-07-24 DIAGNOSIS — M47812 Spondylosis without myelopathy or radiculopathy, cervical region: Secondary | ICD-10-CM | POA: Diagnosis not present

## 2017-07-26 DIAGNOSIS — H43391 Other vitreous opacities, right eye: Secondary | ICD-10-CM | POA: Diagnosis not present

## 2017-07-26 DIAGNOSIS — H43813 Vitreous degeneration, bilateral: Secondary | ICD-10-CM | POA: Diagnosis not present

## 2017-07-26 DIAGNOSIS — H353132 Nonexudative age-related macular degeneration, bilateral, intermediate dry stage: Secondary | ICD-10-CM | POA: Diagnosis not present

## 2017-07-26 DIAGNOSIS — H44001 Unspecified purulent endophthalmitis, right eye: Secondary | ICD-10-CM | POA: Diagnosis not present

## 2017-08-01 DIAGNOSIS — H44001 Unspecified purulent endophthalmitis, right eye: Secondary | ICD-10-CM | POA: Diagnosis not present

## 2017-08-01 DIAGNOSIS — H353122 Nonexudative age-related macular degeneration, left eye, intermediate dry stage: Secondary | ICD-10-CM | POA: Diagnosis not present

## 2017-08-01 DIAGNOSIS — H43391 Other vitreous opacities, right eye: Secondary | ICD-10-CM | POA: Diagnosis not present

## 2017-08-01 DIAGNOSIS — H43813 Vitreous degeneration, bilateral: Secondary | ICD-10-CM | POA: Diagnosis not present

## 2017-08-02 ENCOUNTER — Other Ambulatory Visit: Payer: Self-pay | Admitting: Internal Medicine

## 2017-08-02 DIAGNOSIS — E1140 Type 2 diabetes mellitus with diabetic neuropathy, unspecified: Secondary | ICD-10-CM

## 2017-08-08 DIAGNOSIS — H43813 Vitreous degeneration, bilateral: Secondary | ICD-10-CM | POA: Diagnosis not present

## 2017-08-08 DIAGNOSIS — H353122 Nonexudative age-related macular degeneration, left eye, intermediate dry stage: Secondary | ICD-10-CM | POA: Diagnosis not present

## 2017-08-08 DIAGNOSIS — H43391 Other vitreous opacities, right eye: Secondary | ICD-10-CM | POA: Diagnosis not present

## 2017-08-11 ENCOUNTER — Other Ambulatory Visit: Payer: Self-pay | Admitting: Family Medicine

## 2017-08-14 DIAGNOSIS — H43311 Vitreous membranes and strands, right eye: Secondary | ICD-10-CM | POA: Diagnosis not present

## 2017-08-14 DIAGNOSIS — H43391 Other vitreous opacities, right eye: Secondary | ICD-10-CM | POA: Diagnosis not present

## 2017-08-14 DIAGNOSIS — H26491 Other secondary cataract, right eye: Secondary | ICD-10-CM | POA: Diagnosis not present

## 2017-08-14 NOTE — Telephone Encounter (Signed)
Requesting:  Tramadol Contract: None UDS: None Last OV: 10.02.2018 Next OV: Not Scheduled Last Refill: 10.03.2018   Please advise

## 2017-08-14 NOTE — Telephone Encounter (Signed)
Faxed to pharmacy/thx dmf

## 2017-08-17 IMAGING — CR DG FOOT COMPLETE 3+V*L*
1 series · 3 of 3 positions shown · non-contrast
Comparison: None.

CLINICAL DATA: Diabetic patient with a wound on the bottom of the
left foot which the patient first noticed a week ago. Initial
encounter.

EXAM:
LEFT FOOT - COMPLETE 3+ VIEW

[Series 1: dg foot complete left · 0.14mm/px · 3 of 3 slices shown]
[im 1/3]
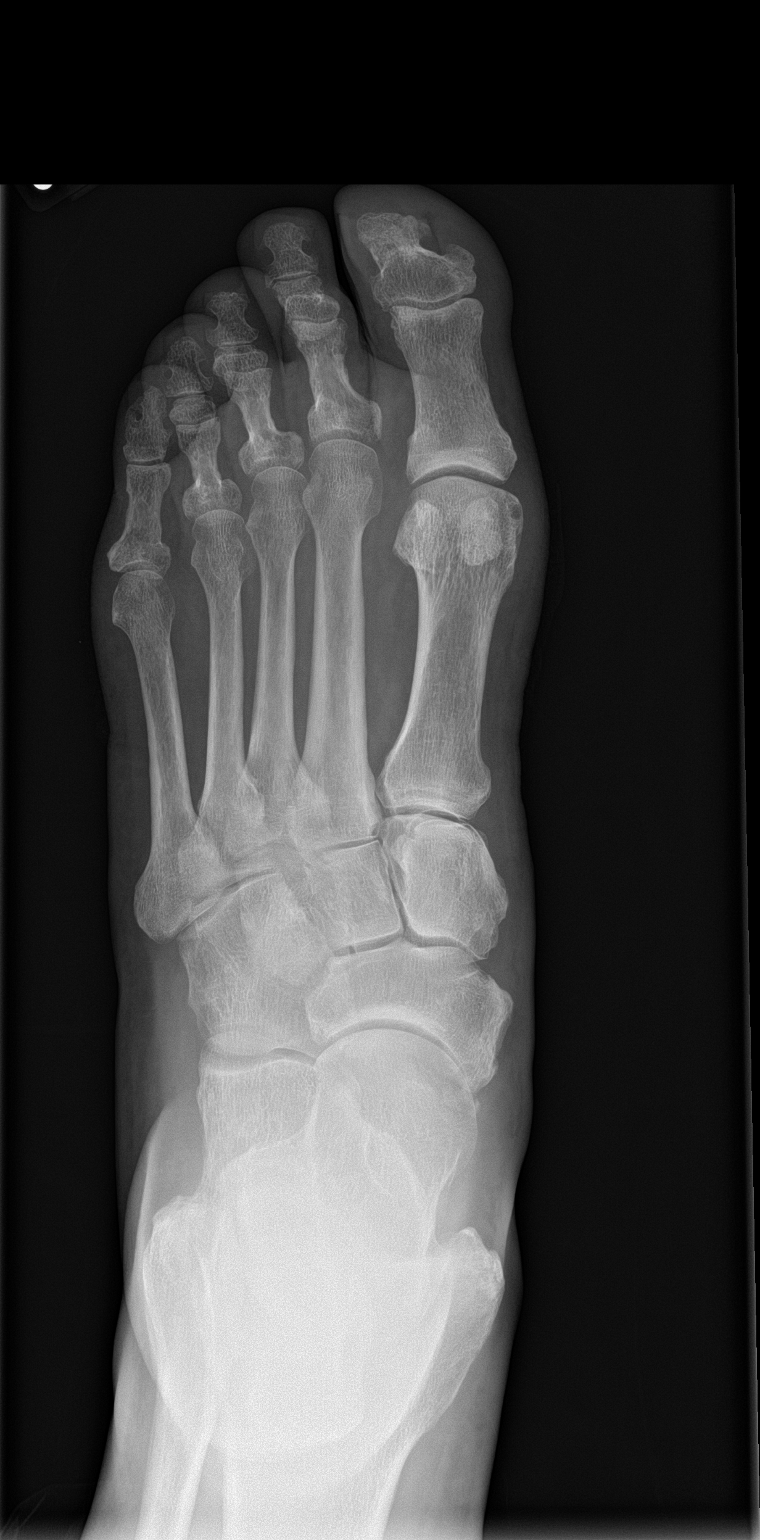
[im 2/3]
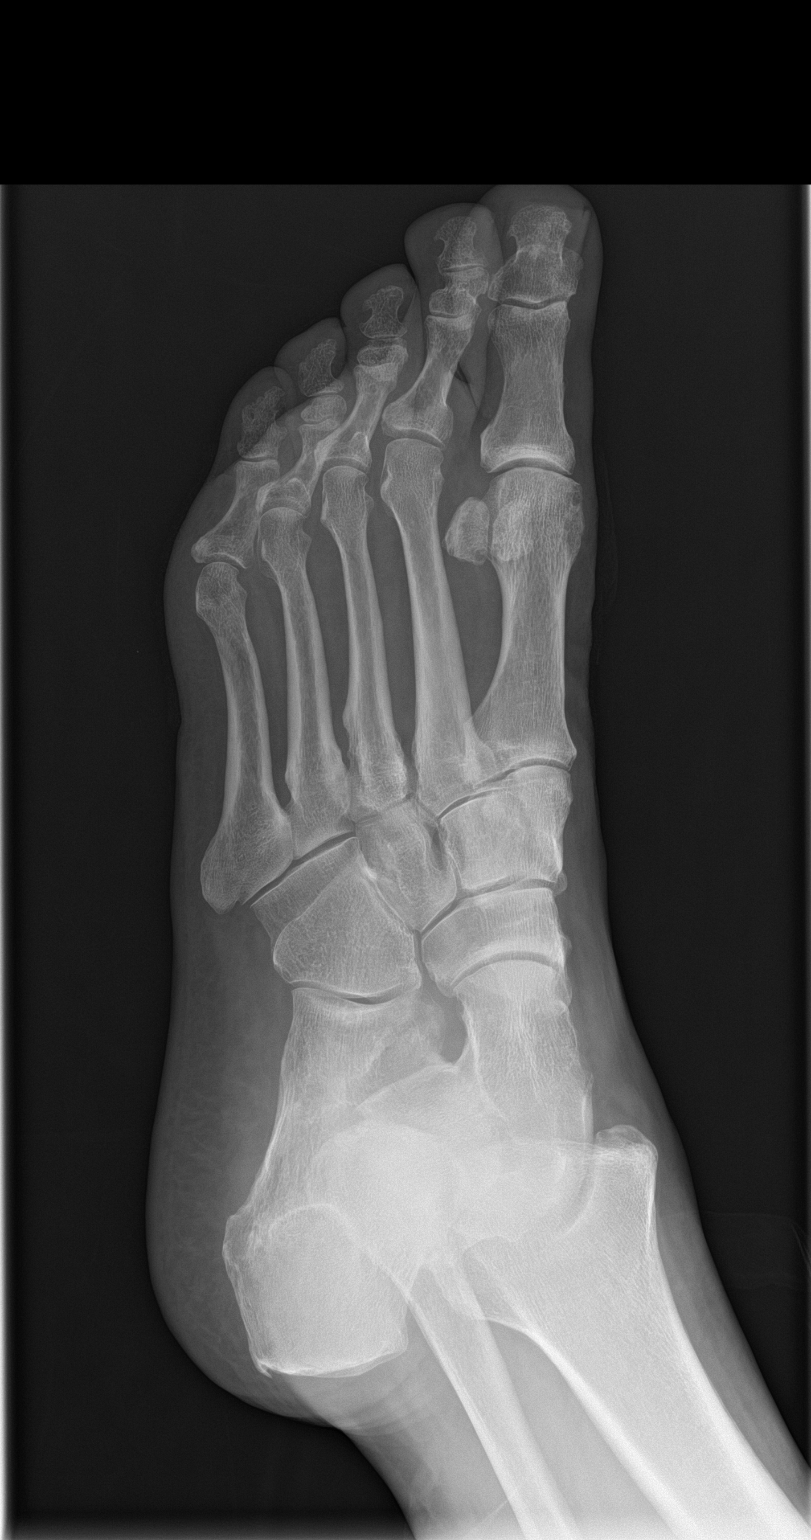
[im 3/3]
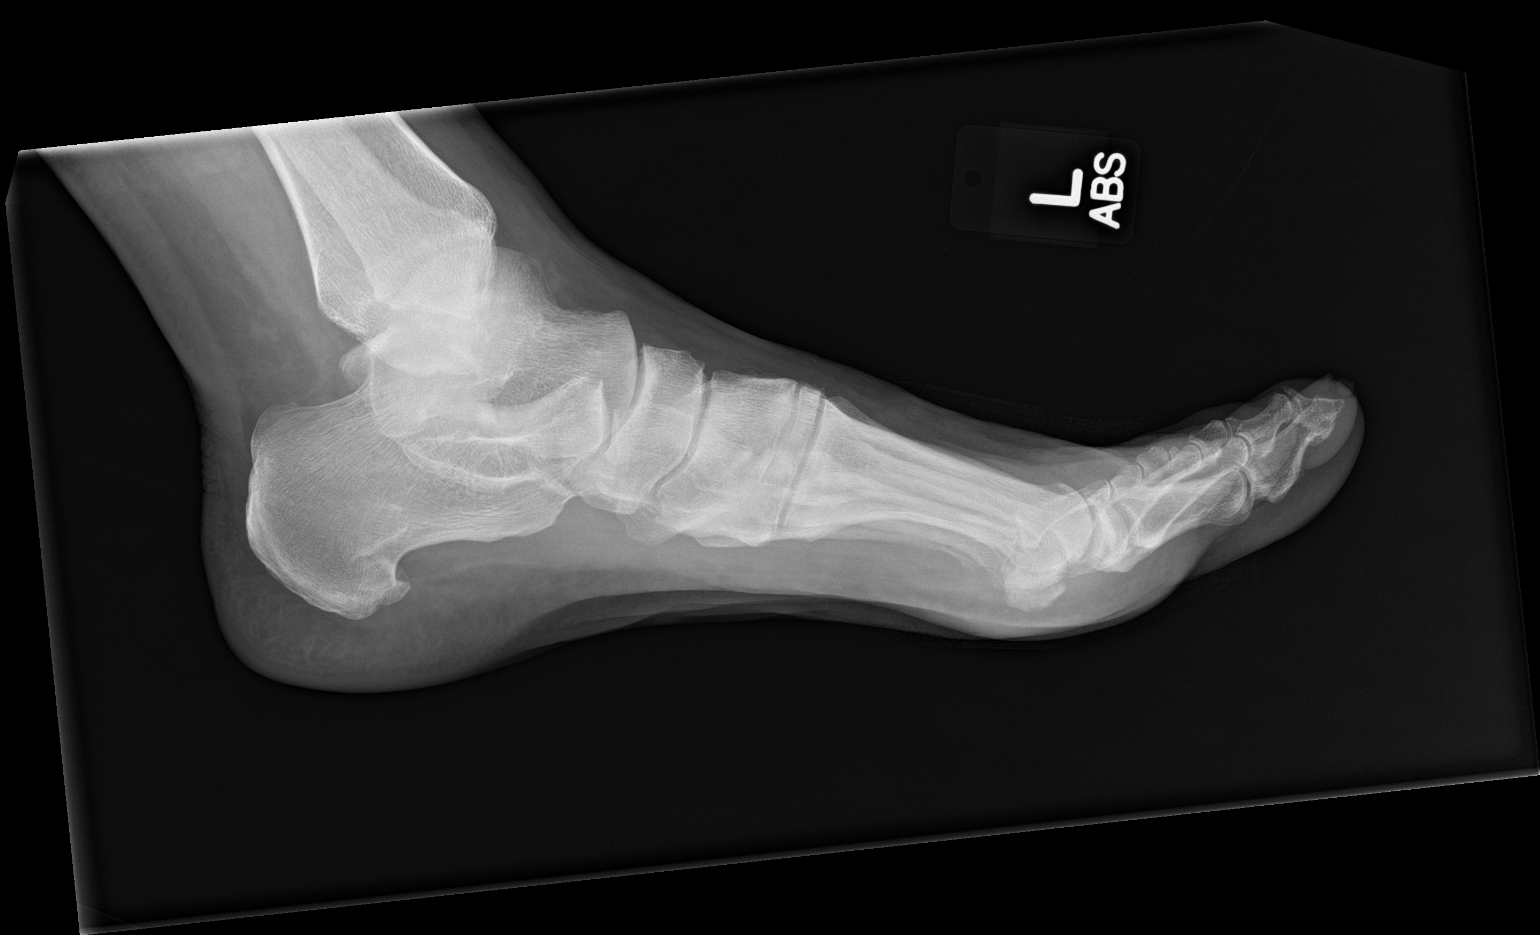

[3 of 3 positions shown; findings below may reference images not displayed]

FINDINGS: The patient's soft tissue wound is not visualized on this study. No
bony destructive change or periosteal reaction is identified. No
soft tissue gas collection or radiopaque foreign body is seen. There
is no acute bony or joint abnormality. Small plantar calcaneal spur
is noted.
IMPRESSION: No acute abnormality.

## 2017-09-06 DIAGNOSIS — N401 Enlarged prostate with lower urinary tract symptoms: Secondary | ICD-10-CM | POA: Diagnosis not present

## 2017-09-12 DIAGNOSIS — N401 Enlarged prostate with lower urinary tract symptoms: Secondary | ICD-10-CM | POA: Diagnosis not present

## 2017-09-12 DIAGNOSIS — H40051 Ocular hypertension, right eye: Secondary | ICD-10-CM | POA: Diagnosis not present

## 2017-09-12 DIAGNOSIS — R300 Dysuria: Secondary | ICD-10-CM | POA: Diagnosis not present

## 2017-09-12 DIAGNOSIS — H15101 Unspecified episcleritis, right eye: Secondary | ICD-10-CM | POA: Diagnosis not present

## 2017-09-13 ENCOUNTER — Other Ambulatory Visit: Payer: Self-pay | Admitting: Family Medicine

## 2017-09-19 ENCOUNTER — Other Ambulatory Visit: Payer: Self-pay | Admitting: Family Medicine

## 2017-09-22 DIAGNOSIS — H40051 Ocular hypertension, right eye: Secondary | ICD-10-CM | POA: Diagnosis not present

## 2017-09-22 DIAGNOSIS — H15101 Unspecified episcleritis, right eye: Secondary | ICD-10-CM | POA: Diagnosis not present

## 2017-09-29 DIAGNOSIS — H2011 Chronic iridocyclitis, right eye: Secondary | ICD-10-CM | POA: Diagnosis not present

## 2017-09-29 DIAGNOSIS — H40051 Ocular hypertension, right eye: Secondary | ICD-10-CM | POA: Diagnosis not present

## 2017-10-05 DIAGNOSIS — R05 Cough: Secondary | ICD-10-CM | POA: Diagnosis not present

## 2017-10-05 DIAGNOSIS — H1820 Unspecified corneal edema: Secondary | ICD-10-CM | POA: Diagnosis not present

## 2017-10-05 DIAGNOSIS — Z8709 Personal history of other diseases of the respiratory system: Secondary | ICD-10-CM | POA: Diagnosis not present

## 2017-10-05 DIAGNOSIS — H2011 Chronic iridocyclitis, right eye: Secondary | ICD-10-CM | POA: Diagnosis not present

## 2017-10-05 DIAGNOSIS — J019 Acute sinusitis, unspecified: Secondary | ICD-10-CM | POA: Diagnosis not present

## 2017-10-05 DIAGNOSIS — H40053 Ocular hypertension, bilateral: Secondary | ICD-10-CM | POA: Diagnosis not present

## 2017-10-12 DIAGNOSIS — H2011 Chronic iridocyclitis, right eye: Secondary | ICD-10-CM | POA: Diagnosis not present

## 2017-10-12 DIAGNOSIS — H40053 Ocular hypertension, bilateral: Secondary | ICD-10-CM | POA: Diagnosis not present

## 2017-10-16 DIAGNOSIS — H40051 Ocular hypertension, right eye: Secondary | ICD-10-CM | POA: Diagnosis not present

## 2017-10-16 DIAGNOSIS — H3581 Retinal edema: Secondary | ICD-10-CM | POA: Diagnosis not present

## 2017-10-18 DIAGNOSIS — J019 Acute sinusitis, unspecified: Secondary | ICD-10-CM | POA: Diagnosis not present

## 2017-10-18 DIAGNOSIS — R05 Cough: Secondary | ICD-10-CM | POA: Diagnosis not present

## 2017-10-18 DIAGNOSIS — B9689 Other specified bacterial agents as the cause of diseases classified elsewhere: Secondary | ICD-10-CM | POA: Diagnosis not present

## 2017-10-18 DIAGNOSIS — J9801 Acute bronchospasm: Secondary | ICD-10-CM | POA: Diagnosis not present

## 2017-10-18 DIAGNOSIS — R509 Fever, unspecified: Secondary | ICD-10-CM | POA: Diagnosis not present

## 2017-10-18 DIAGNOSIS — J209 Acute bronchitis, unspecified: Secondary | ICD-10-CM | POA: Diagnosis not present

## 2017-10-18 DIAGNOSIS — R6889 Other general symptoms and signs: Secondary | ICD-10-CM | POA: Diagnosis not present

## 2017-10-19 ENCOUNTER — Ambulatory Visit: Payer: Medicare Other | Admitting: Family Medicine

## 2017-10-19 ENCOUNTER — Other Ambulatory Visit: Payer: Self-pay | Admitting: Adult Health

## 2017-10-19 DIAGNOSIS — Z0289 Encounter for other administrative examinations: Secondary | ICD-10-CM

## 2017-10-19 NOTE — Telephone Encounter (Signed)
Pt needs a New Prescription of albertal and  Neoro inh     [facility] [place].   She was wondering if he has to come in for an appt  Before he gets a new prescription   [name] wife cell [phone]

## 2017-10-19 NOTE — Telephone Encounter (Signed)
lmtcb X1 for pt/spouse. Pt not seen since 2017, needs OV for additional refills.

## 2017-10-20 ENCOUNTER — Telehealth: Payer: Self-pay | Admitting: Internal Medicine

## 2017-10-20 DIAGNOSIS — J209 Acute bronchitis, unspecified: Secondary | ICD-10-CM

## 2017-10-20 MED ORDER — UMECLIDINIUM-VILANTEROL 62.5-25 MCG/INH IN AEPB: 1.0000 | INHALATION_SPRAY | Freq: Every day | RESPIRATORY_TRACT | 0 refills | Status: DC

## 2017-10-20 MED ORDER — ALBUTEROL SULFATE HFA 108 (90 BASE) MCG/ACT IN AERS: 2.0000 | INHALATION_SPRAY | Freq: Four times a day (QID) | RESPIRATORY_TRACT | 0 refills | Status: AC | PRN

## 2017-10-20 NOTE — Telephone Encounter (Signed)
Spoke with patient's wife [name]. Advised her that the patient needed an appt for refills. Patient has been scheduled for 10/30/17 at 415. Advised that I would send in one refill of each. She verbalized understanding. Nothing else needed at time of call.

## 2017-10-21 ENCOUNTER — Other Ambulatory Visit: Payer: Self-pay | Admitting: Internal Medicine

## 2017-10-23 DIAGNOSIS — H40041 Steroid responder, right eye: Secondary | ICD-10-CM | POA: Diagnosis not present

## 2017-10-25 ENCOUNTER — Other Ambulatory Visit: Payer: Self-pay

## 2017-10-25 MED ORDER — GLUCOSE BLOOD VI STRP: ORAL_STRIP | 1 refills | Status: AC

## 2017-10-30 ENCOUNTER — Encounter: Payer: Self-pay | Admitting: Adult Health

## 2017-10-30 ENCOUNTER — Other Ambulatory Visit (INDEPENDENT_AMBULATORY_CARE_PROVIDER_SITE_OTHER): Payer: Medicare Other

## 2017-10-30 ENCOUNTER — Ambulatory Visit (INDEPENDENT_AMBULATORY_CARE_PROVIDER_SITE_OTHER): Payer: Medicare Other | Admitting: Adult Health

## 2017-10-30 ENCOUNTER — Ambulatory Visit (INDEPENDENT_AMBULATORY_CARE_PROVIDER_SITE_OTHER)
Admission: RE | Admit: 2017-10-30 | Discharge: 2017-10-30 | Disposition: A | Discharge status: Home / Self Care | Payer: Medicare Other | Source: Ambulatory Visit | Attending: Adult Health | Admitting: Adult Health

## 2017-10-30 VITALS — BP 118/64 | HR 81 | Temp 97.8°F | Ht 72.0 in | Wt 249.2 lb

## 2017-10-30 DIAGNOSIS — R0989 Other specified symptoms and signs involving the circulatory and respiratory systems: Secondary | ICD-10-CM | POA: Diagnosis not present

## 2017-10-30 DIAGNOSIS — R0600 Dyspnea, unspecified: Secondary | ICD-10-CM

## 2017-10-30 DIAGNOSIS — R05 Cough: Secondary | ICD-10-CM | POA: Diagnosis not present

## 2017-10-30 DIAGNOSIS — J309 Allergic rhinitis, unspecified: Secondary | ICD-10-CM

## 2017-10-30 DIAGNOSIS — J432 Centrilobular emphysema: Secondary | ICD-10-CM

## 2017-10-30 LAB — BRAIN NATRIURETIC PEPTIDE: Pro B Natriuretic peptide (BNP): 113 pg/mL — ABNORMAL HIGH (ref 0.0–100.0)

## 2017-10-30 LAB — BASIC METABOLIC PANEL
BUN: 19 mg/dL (ref 6–23)
CALCIUM: 9.2 mg/dL (ref 8.4–10.5)
CO2: 23 meq/L (ref 19–32)
CREATININE: 1.09 mg/dL (ref 0.40–1.50)
Chloride: 106 mEq/L (ref 96–112)
GFR: 69.96 mL/min (ref >60.00)
Glucose, Bld: 178 mg/dL — ABNORMAL HIGH (ref 70–99)
Potassium: 3.7 mEq/L (ref 3.5–5.1)
SODIUM: 137 meq/L (ref 135–145)

## 2017-10-30 LAB — CBC WITH DIFFERENTIAL/PLATELET
BASOS ABS: 0 10*3/uL (ref 0.0–0.1)
Basophils Relative: 0.4 % (ref 0.0–3.0)
Eosinophils Absolute: 0.1 10*3/uL (ref 0.0–0.7)
Eosinophils Relative: 1.3 % (ref 0.0–5.0)
HCT: 43.9 % (ref 39.0–52.0)
Hemoglobin: 14.8 g/dL (ref 13.0–17.0)
LYMPHS ABS: 0.8 10*3/uL (ref 0.7–4.0)
Lymphocytes Relative: 10.3 % — ABNORMAL LOW (ref 12.0–46.0)
MCHC: 33.6 g/dL (ref 30.0–36.0)
MCV: 93.9 fl (ref 78.0–100.0)
MONO ABS: 1.1 10*3/uL — AB (ref 0.1–1.0)
MONOS PCT: 15.3 % — AB (ref 3.0–12.0)
NEUTROS ABS: 5.3 10*3/uL (ref 1.4–7.7)
NEUTROS PCT: 72.7 % (ref 43.0–77.0)
PLATELETS: 170 10*3/uL (ref 150.0–400.0)
RBC: 4.67 Mil/uL (ref 4.22–5.81)
RDW: 13.9 % (ref 11.5–15.5)
WBC: 7.3 10*3/uL (ref 4.0–10.5)

## 2017-10-30 MED ORDER — LEVALBUTEROL HCL 0.63 MG/3ML IN NEBU
0.6300 mg | INHALATION_SOLUTION | Freq: Once | RESPIRATORY_TRACT | Status: AC
  Administered 2017-10-30: 0.63 mg via RESPIRATORY_TRACT

## 2017-10-30 MED ORDER — ALBUTEROL SULFATE (2.5 MG/3ML) 0.083% IN NEBU: 2.5000 mg | INHALATION_SOLUTION | Freq: Four times a day (QID) | RESPIRATORY_TRACT | 11 refills | Status: DC | PRN

## 2017-10-30 MED ORDER — PREDNISONE 10 MG PO TABS: ORAL_TABLET | ORAL | 0 refills | Status: DC

## 2017-10-30 NOTE — Assessment & Plan Note (Signed)
Add saline and zyrtec

## 2017-10-30 NOTE — Progress Notes (Signed)
$'@Patient'l$  ID: Nicholas [name], male    DOB: [birth year], 77 y.o.   MRN: [id number]  Chief Complaint  Patient presents with  . Acute Visit    COPD     Referring provider: Lucille Passy, MD  HPI: 77 year old male former smoker with known COPD  TEST  Spirometry 10/02/14-Moderate obstruction, FEV1 55%, FVC 67%.  NPSG 2007, AHI 40/ hr Advanced   10/30/2017 Acute OV : COPD  Pt presents for an acute office visit. Complains of 1 month of ongoing cough, congestion with thick yellow mucus , fatigue , low grade fevers and sinus congestion/drainage .  He has been seen x 2 with PCP , given  Augmentin and Avelox for 10 days .  Marland Kitchen1 prednisone taper.  Says only miminal improvement in symptoms . Still not feeling good.  He was last seen in office in 08/2016 for Moderate COPD . He says he remains on ANORO daily.  Chest xray today shows Chronic changes w/         Allergies  Allergen Reactions  . Sudafed [Pseudoephedrine Hcl]     "makes me feel drunk"    Immunization History  Administered Date(s) Administered  . Influenza, Seasonal, Injecte, Preservative Fre 09/05/2012  . Influenza-Unspecified 09/29/2017  . Pneumococcal Conjugate-13 02/11/2015  . Pneumococcal Polysaccharide-23 09/05/2012  . Tdap 07/08/2014  . Zoster 09/13/2012    Past Medical History:  Diagnosis Date  . Arthritis   . Blister of foot or toe    left ssecond toe healing per note of 09/21/12   . BPH (benign prostatic hypertrophy)   . COPD (chronic obstructive pulmonary disease) ([place])   . Descending colostomy s/p reversal 09/27/12 09/28/2012  . Diabetes mellitus    boarderline  . Diabetic foot ulcer ([place]) 09/22/2015  . Diverticulitis of colon with perforation 12/24/2011  . Heart murmur    noted on visit of 09/20/12- EPIC   . Neuropathy   . OSA on CPAP    cpap- doe snot know settings   . Pneumonia    hx. of  . Shortness of breath dyspnea   . Sinus infection    dx 12/13   . Ventral hernia 2013    Tobacco  History: Social History   Tobacco Use  Smoking Status Former Smoker  . Packs/day: 2.00  . Years: 40.00  . Pack years: 80.00  . Types: Cigarettes  . Last attempt to quit: 10/10/1993  . Years since quitting: 24.0  Smokeless Tobacco Former Air traffic controller given: Not Answered   Outpatient Encounter Medications as of 10/30/2017  Medication Sig  . albuterol (PROVENTIL HFA;VENTOLIN HFA) 108 (90 Base) MCG/ACT inhaler Inhale 2 puffs into the lungs every 6 (six) hours as needed for wheezing or shortness of breath.  Marland Kitchen atropine 1 % ophthalmic solution INSTILL 1 DROP INTO RIGHT EYE TWICE A DAY  . Blood Glucose Monitoring Suppl (ONE TOUCH ULTRA SYSTEM KIT) W/DEVICE KIT 1 kit by Does not apply route once. Use as directed to test blood sugar once daily  Dx:E11.49  . dorzolamide-timolol (COSOPT) 22.3-6.8 MG/ML ophthalmic solution PLACE 1 DROP INTO RIGHT EYE TWICE A DAY  . DUREZOL 0.05 % EMUL INSTILL 1 DROP INTO RIGHT EYE TWICE A DAY  . fluticasone (FLONASE) 50 MCG/ACT nasal spray PLACE 2 SPRAYS INTO BOTH NOSTRILS DAILY.  Marland Kitchen gabapentin (NEURONTIN) 400 MG capsule TAKE ONE CAPSULE BY MOUTH 4 TIMES A DAY  . glipiZIDE (GLUCOTROL XL) 5 MG 24 hr tablet Take 2 tablets (10 mg total) by  mouth 2 (two) times daily before a meal.  . glucose blood (ONE TOUCH ULTRA TEST) test strip TEST 2 TIMES A DAY DX: E11.4  . Lancets (ONETOUCH ULTRASOFT) lancets Use as instructed  . linagliptin (TRADJENTA) 5 MG TABS tablet Take 1 tablet (5 mg total) by mouth daily before breakfast.  . metFORMIN (GLUCOPHAGE) 500 MG tablet TAKE 1 TABLET BY MOUTH 2 TIMES DAILY WITH A MEAL.  . naproxen sodium (ANAPROX) 220 MG tablet Take 220 mg by mouth 2 (two) times daily as needed. For pain  . Tamsulosin HCl (FLOMAX) 0.4 MG CAPS Take 0.8 mg by mouth at bedtime.   . traMADol (ULTRAM) 50 MG tablet TAKE 1 TABLET BY MOUTH EVERY 6 HOURS AS NEEDED FOR PAIN  . umeclidinium-vilanterol (ANORO ELLIPTA) 62.5-25 MCG/INH AEPB Inhale 1 puff into the lungs daily.   Marland Kitchen albuterol (PROVENTIL) (2.5 MG/3ML) 0.083% nebulizer solution Take 3 mLs (2.5 mg total) by nebulization every 6 (six) hours as needed for wheezing or shortness of breath.  . predniSONE (DELTASONE) 10 MG tablet 4 tabs for 2 days, then 3 tabs for 2 days, 2 tabs for 2 days, then 1 tab for 2 days, then stop  . [DISCONTINUED] glipiZIDE (GLUCOTROL XL) 5 MG 24 hr tablet TAKE 2 TABLETS BY MOUTH DAILY WITH BREAKFAST.  . [EXPIRED] levalbuterol (XOPENEX) nebulizer solution 0.63 mg    No facility-administered encounter medications on file as of 10/30/2017.      Review of Systems  Constitutional:   No  weight loss, night sweats,  Fevers, chills, fatigue, or  lassitude.  HEENT:   No headaches,  Difficulty swallowing,  Tooth/dental problems, or  Sore throat,                No sneezing, itching, ear ache, nasal congestion, post nasal drip,   CV:  No chest pain,  Orthopnea, PND, swelling in lower extremities, anasarca, dizziness, palpitations, syncope.   GI  No heartburn, indigestion, abdominal pain, nausea, vomiting, diarrhea, change in bowel habits, loss of appetite, bloody stools.   Resp: No shortness of breath with exertion or at rest.  No excess mucus, no productive cough,  No non-productive cough,  No coughing up of blood.  No change in color of mucus.  No wheezing.  No chest wall deformity  Skin: no rash or lesions.  GU: no dysuria, change in color of urine, no urgency or frequency.  No flank pain, no hematuria   MS:  No joint pain or swelling.  No decreased range of motion.  No back pain.    Physical Exam  BP 118/64 (BP Location: Left Arm, Cuff Size: Normal)   Pulse 81   Temp 97.8 F (36.6 C) (Oral)   Ht 6' (1.829 m)   Wt 249 lb 3.2 oz (113 kg)   SpO2 98%   BMI 33.80 kg/m   GEN: A/Ox3; pleasant , NAD, elderly    HEENT:  [place]/AT,  EACs-clear, TMs-wnl, NOSE-clear, THROAT-clear, no lesions, no postnasal drip or exudate noted.   NECK:  Supple w/ fair ROM; no JVD; normal carotid  impulses w/o bruits; no thyromegaly or nodules palpated; no lymphadenopathy.    RESP  Few trace rhonchi ,  no accessory muscle use, no dullness to percussion  CARD:  RRR, no m/r/g, no peripheral edema, pulses intact, no cyanosis or clubbing.  GI:   Soft & nt; nml bowel sounds; no organomegaly or masses detected.   Musco: Warm bil, no deformities or joint swelling noted.   Neuro: alert,  no focal deficits noted.    Skin: Warm, no lesions or rashes    Lab Results:  CBC  No results found for: BNP  ProBNP No results found for: PROBNP  Imaging: Dg Chest 2 View  Result Date: 10/30/2017 CLINICAL DATA:  Cough, fever and chest tightness x1 month. Ex-smoker. EXAM: CHEST  2 VIEW COMPARISON:  None. FINDINGS: The heart size and mediastinal contours are within normal limits. Aortic atherosclerosis of the arch without aneurysmal dilatation. Emphysematous hyperinflation of the lungs, stable in appearance without acute pneumonic consolidation or CHF. The visualized skeletal structures are unremarkable. IMPRESSION: 1. Hyperinflated lungs consistent with COPD. 2. Minimal aortic atherosclerosis at the arch. 3. No active pulmonary disease. Electronically Signed   By: Ashley Royalty M.D.   On: 10/30/2017 16:05     Assessment & Plan:   COPD (chronic obstructive pulmonary disease) with emphysema (HCC) Slow to resolve exacerbation - CXR w/ no acute process  Hold on additional abx for now  Steroid taper  Add mucinex, saline nasal and zyrtec  Add albuterol neb   Plan  Patient Instructions  Prednisone taper over next week .  Call if blood sugars >250.  Begin Mucinex DM Twice daily As needed  Cough/congestion  Begin Zyrtec '10mg'$  At bedtime  As needed  Drainage .  Saline nasal rinses Twice daily  .  Continue on ANORO 1 puff daily.  May use Albuterol Neb every 4hrs as needed for wheezing /shortness of breath .  Follow up in 3-4 weeks and As needed  Dr. Annamaria Boots  Or Renwick Asman  Please contact office for  sooner follow up if symptoms do not improve or worsen or seek emergency care       Allergic rhinitis Add saline and zyrtec      Nicholas Melcher, NP 10/30/2017

## 2017-10-30 NOTE — Assessment & Plan Note (Signed)
Slow to resolve exacerbation - CXR w/ no acute process  Hold on additional abx for now  Steroid taper  Add mucinex, saline nasal and zyrtec  Add albuterol neb   Plan  Patient Instructions  Prednisone taper over next week .  Call if blood sugars >250.  Begin Mucinex DM Twice daily As needed  Cough/congestion  Begin Zyrtec 10mg  At bedtime  As needed  Drainage .  Saline nasal rinses Twice daily  .  Continue on ANORO 1 puff daily.  May use Albuterol Neb every 4hrs as needed for wheezing /shortness of breath .  Follow up in 3-4 weeks and As needed  Dr. Annamaria Boots  Or Jaquil Todt  Please contact office for sooner follow up if symptoms do not improve or worsen or seek emergency care

## 2017-10-30 NOTE — Patient Instructions (Addendum)
Prednisone taper over next week .  Call if blood sugars >250.  Begin Mucinex DM Twice daily As needed  Cough/congestion  Begin Zyrtec 10mg  At bedtime  As needed  Drainage .  Saline nasal rinses Twice daily  .  Continue on ANORO 1 puff daily.  May use Albuterol Neb every 4hrs as needed for wheezing /shortness of breath .  Follow up in 3-4 weeks and As needed  Dr. Annamaria Boots  Or [place]  Please contact office for sooner follow up if symptoms do not improve or worsen or seek emergency care

## 2017-10-31 ENCOUNTER — Other Ambulatory Visit: Payer: Self-pay

## 2017-10-31 MED ORDER — ALBUTEROL SULFATE (2.5 MG/3ML) 0.083% IN NEBU: 2.5000 mg | INHALATION_SOLUTION | Freq: Four times a day (QID) | RESPIRATORY_TRACT | 5 refills | Status: DC | PRN

## 2017-11-01 ENCOUNTER — Other Ambulatory Visit: Payer: Self-pay

## 2017-11-01 MED ORDER — GABAPENTIN 400 MG PO CAPS: ORAL_CAPSULE | ORAL | 2 refills | Status: DC

## 2017-11-06 ENCOUNTER — Telehealth: Payer: Self-pay | Admitting: Adult Health

## 2017-11-06 DIAGNOSIS — H35052 Retinal neovascularization, unspecified, left eye: Secondary | ICD-10-CM | POA: Diagnosis not present

## 2017-11-06 DIAGNOSIS — H35352 Cystoid macular degeneration, left eye: Secondary | ICD-10-CM | POA: Diagnosis not present

## 2017-11-06 MED ORDER — ALBUTEROL SULFATE (2.5 MG/3ML) 0.083% IN NEBU: 2.5000 mg | INHALATION_SOLUTION | Freq: Four times a day (QID) | RESPIRATORY_TRACT | 5 refills | Status: AC | PRN

## 2017-11-06 NOTE — Telephone Encounter (Signed)
New RX has been sent to pharmacy. Will close this message.

## 2017-11-09 DIAGNOSIS — H40041 Steroid responder, right eye: Secondary | ICD-10-CM | POA: Diagnosis not present

## 2017-11-16 ENCOUNTER — Other Ambulatory Visit: Payer: Self-pay | Admitting: Internal Medicine

## 2017-11-20 ENCOUNTER — Ambulatory Visit (INDEPENDENT_AMBULATORY_CARE_PROVIDER_SITE_OTHER): Payer: Medicare Other | Admitting: Adult Health

## 2017-11-20 ENCOUNTER — Encounter: Payer: Self-pay | Admitting: Adult Health

## 2017-11-20 DIAGNOSIS — G4733 Obstructive sleep apnea (adult) (pediatric): Secondary | ICD-10-CM | POA: Diagnosis not present

## 2017-11-20 DIAGNOSIS — J432 Centrilobular emphysema: Secondary | ICD-10-CM | POA: Diagnosis not present

## 2017-11-20 NOTE — Assessment & Plan Note (Signed)
Well controlled on CPAP. 

## 2017-11-20 NOTE — Patient Instructions (Addendum)
Continue on ANORO 1 puff daily.  May use Albuterol Neb every 4hrs as needed for wheezing /shortness of breath .  Continue on CPAP At bedtime   Follow up in 4 months with  Dr. Annamaria Boots  And As needed   Please contact office for sooner follow up if symptoms do not improve or worsen or seek emergency care

## 2017-11-20 NOTE — Progress Notes (Signed)
$'@Patient'w$  ID: Audley Hose, male    DOB: Apr 27, 1941, 77 y.o.   MRN: [id number]  Chief Complaint  Patient presents with  . Follow-up    COPD     Referring provider: Lucille Passy, MD  HPI: 77 year old male former smoker with known COPD  TEST Spirometry 10/02/14-Moderate obstruction, FEV1 55%, FVC 67%.  NPSG 2007, AHI 40/ hr Advanced  11/20/2017 Follow up ; COPD  Pt presents for a 2 week follow up . Seen last ov with COPD flare  CXR w/ COPD changes . He was given prednisone taper . He is feeling better with decreased cough and dyspnea.  Is back to work . Remains on ANORO daily.  He is feeling he is getting back to his baseline .  Vaccines are up to date.   Has OSA on CPAP At bedtime  .doing well feels rested Download shows excellent compliance with average usage at 8 hours.  AHI 0.5/hr.      Allergies  Allergen Reactions  . Sudafed [Pseudoephedrine Hcl]     "makes me feel drunk"    Immunization History  Administered Date(s) Administered  . Influenza, Seasonal, Injecte, Preservative Fre 09/05/2012  . Influenza-Unspecified 09/29/2017  . Pneumococcal Conjugate-13 02/11/2015  . Pneumococcal Polysaccharide-23 09/05/2012  . Tdap 07/08/2014  . Zoster 09/13/2012    Past Medical History:  Diagnosis Date  . Arthritis   . Blister of foot or toe    left ssecond toe healing per note of 09/21/12   . BPH (benign prostatic hypertrophy)   . COPD (chronic obstructive pulmonary disease) ([place])   . Descending colostomy s/p reversal 09/27/12 09/28/2012  . Diabetes mellitus    boarderline  . Diabetic foot ulcer ([place]) 09/22/2015  . Diverticulitis of colon with perforation 12/24/2011  . Heart murmur    noted on visit of 09/20/12- EPIC   . Neuropathy   . OSA on CPAP    cpap- doe snot know settings   . Pneumonia    hx. of  . Shortness of breath dyspnea   . Sinus infection    dx 12/13   . Ventral hernia 2013    Tobacco History: Social History   Tobacco Use  Smoking  Status Former Smoker  . Packs/day: 2.00  . Years: 40.00  . Pack years: 80.00  . Types: Cigarettes  . Last attempt to quit: 10/10/1993  . Years since quitting: 24.1  Smokeless Tobacco Former Systems developer   Counseling given: Not Answered   Outpatient Encounter Medications as of 11/20/2017  Medication Sig  . albuterol (PROVENTIL HFA;VENTOLIN HFA) 108 (90 Base) MCG/ACT inhaler Inhale 2 puffs into the lungs every 6 (six) hours as needed for wheezing or shortness of breath.  Marland Kitchen albuterol (PROVENTIL) (2.5 MG/3ML) 0.083% nebulizer solution Take 3 mLs (2.5 mg total) by nebulization every 6 (six) hours as needed for wheezing or shortness of breath.  Jearl Klinefelter ELLIPTA 62.5-25 MCG/INH AEPB TAKE 1 PUFF BY MOUTH EVERY DAY  . atropine 1 % ophthalmic solution INSTILL 1 DROP INTO RIGHT EYE TWICE A DAY  . Blood Glucose Monitoring Suppl (ONE TOUCH ULTRA SYSTEM KIT) W/DEVICE KIT 1 kit by Does not apply route once. Use as directed to test blood sugar once daily  Dx:E11.49  . dorzolamide-timolol (COSOPT) 22.3-6.8 MG/ML ophthalmic solution PLACE 1 DROP INTO RIGHT EYE TWICE A DAY  . DUREZOL 0.05 % EMUL INSTILL 1 DROP INTO RIGHT EYE TWICE A DAY  . fluticasone (FLONASE) 50 MCG/ACT nasal spray PLACE 2 SPRAYS INTO BOTH  NOSTRILS DAILY.  Marland Kitchen gabapentin (NEURONTIN) 400 MG capsule Take 1 po qid  . glipiZIDE (GLUCOTROL XL) 5 MG 24 hr tablet Take 2 tablets (10 mg total) by mouth 2 (two) times daily before a meal.  . glucose blood (ONE TOUCH ULTRA TEST) test strip TEST 2 TIMES A DAY DX: E11.4  . Lancets (ONETOUCH ULTRASOFT) lancets Use as instructed  . linagliptin (TRADJENTA) 5 MG TABS tablet Take 1 tablet (5 mg total) by mouth daily before breakfast.  . metFORMIN (GLUCOPHAGE) 500 MG tablet TAKE 1 TABLET BY MOUTH 2 TIMES DAILY WITH A MEAL.  . naproxen sodium (ANAPROX) 220 MG tablet Take 220 mg by mouth 2 (two) times daily as needed. For pain  . predniSONE (DELTASONE) 10 MG tablet 4 tabs for 2 days, then 3 tabs for 2 days, 2 tabs for 2  days, then 1 tab for 2 days, then stop  . Tamsulosin HCl (FLOMAX) 0.4 MG CAPS Take 0.8 mg by mouth at bedtime.   . traMADol (ULTRAM) 50 MG tablet TAKE 1 TABLET BY MOUTH EVERY 6 HOURS AS NEEDED FOR PAIN  . [DISCONTINUED] hydrochlorothiazide (MICROZIDE) 12.5 MG capsule Take 1 capsule (12.5 mg total) by mouth daily.   No facility-administered encounter medications on file as of 11/20/2017.      Review of Systems  Constitutional:   No  weight loss, night sweats,  Fevers, chills, + fatigue, or  lassitude.  HEENT:   No headaches,  Difficulty swallowing,  Tooth/dental problems, or  Sore throat,                No sneezing, itching, ear ache, nasal congestion, post nasal drip,   CV:  No chest pain,  Orthopnea, PND, swelling in lower extremities, anasarca, dizziness, palpitations, syncope.   GI  No heartburn, indigestion, abdominal pain, nausea, vomiting, diarrhea, change in bowel habits, loss of appetite, bloody stools.   Resp: No shortness of breath with exertion or at rest.  No excess mucus, no productive cough,  No non-productive cough,  No coughing up of blood.  No change in color of mucus.  No wheezing.  No chest wall deformity  Skin: no rash or lesions.  GU: no dysuria, change in color of urine, no urgency or frequency.  No flank pain, no hematuria   MS:  No joint pain or swelling.  No decreased range of motion.  No back pain.    Physical Exam  BP 122/68 (BP Location: Left Arm, Cuff Size: Normal)   Pulse 82   Ht 6' (1.829 m)   Wt 256 lb (116.1 kg)   SpO2 97%   BMI 34.72 kg/m   GEN: A/Ox3; pleasant , NAD, obese    HEENT:  [place]/AT,  EACs-clear, TMs-wnl, NOSE-clear, THROAT-clear, no lesions, no postnasal drip or exudate noted. Class 2-3 MP airway   NECK:  Supple w/ fair ROM; no JVD; normal carotid impulses w/o bruits; no thyromegaly or nodules palpated; no lymphadenopathy.    RESP  Clear  P & A; w/o, wheezes/ rales/ or rhonchi. no accessory muscle use, no dullness to  percussion  CARD:  RRR, no m/r/g, 1+ peripheral edema, pulses intact, no cyanosis or clubbing.  GI:   Soft & nt; nml bowel sounds; no organomegaly or masses detected.   Musco: Warm bil, no deformities or joint swelling noted.   Neuro: alert, no focal deficits noted.    Skin: Warm, no lesions or rashes    Lab Results:  CBC  BNP No results found for:  BNP  ProBNP  Imaging: Dg Chest 2 View  Result Date: 10/30/2017 CLINICAL DATA:  Cough, fever and chest tightness x1 month. Ex-smoker. EXAM: CHEST  2 VIEW COMPARISON:  None. FINDINGS: The heart size and mediastinal contours are within normal limits. Aortic atherosclerosis of the arch without aneurysmal dilatation. Emphysematous hyperinflation of the lungs, stable in appearance without acute pneumonic consolidation or CHF. The visualized skeletal structures are unremarkable. IMPRESSION: 1. Hyperinflated lungs consistent with COPD. 2. Minimal aortic atherosclerosis at the arch. 3. No active pulmonary disease. Electronically Signed   By: Ashley Royalty M.D.   On: 10/30/2017 16:05     Assessment & Plan:   COPD (chronic obstructive pulmonary disease) with emphysema (HCC) Recent exacerbation now resolved  Cont on ANORO .   Obstructive sleep apnea Well controlled on CPAP .       Rexene Edison, NP 11/20/2017

## 2017-11-20 NOTE — Assessment & Plan Note (Signed)
Recent exacerbation now resolved  Cont on ANORO .

## 2017-11-22 DIAGNOSIS — H40041 Steroid responder, right eye: Secondary | ICD-10-CM | POA: Diagnosis not present

## 2017-11-22 DIAGNOSIS — H4041X2 Glaucoma secondary to eye inflammation, right eye, moderate stage: Secondary | ICD-10-CM | POA: Diagnosis not present

## 2017-11-22 DIAGNOSIS — H409 Unspecified glaucoma: Secondary | ICD-10-CM | POA: Diagnosis not present

## 2017-11-30 DIAGNOSIS — H3581 Retinal edema: Secondary | ICD-10-CM | POA: Diagnosis not present

## 2017-12-13 ENCOUNTER — Other Ambulatory Visit: Payer: Self-pay | Admitting: Internal Medicine

## 2017-12-14 ENCOUNTER — Other Ambulatory Visit: Payer: Self-pay | Admitting: Family Medicine

## 2017-12-14 NOTE — Telephone Encounter (Signed)
Rx last filled 11/14/17.

## 2017-12-18 ENCOUNTER — Telehealth: Payer: Self-pay

## 2017-12-18 NOTE — Telephone Encounter (Signed)
PA initiated via [facility] for Gabapentin 400mg  caps #120 per 30d/thx dmf

## 2017-12-19 NOTE — Telephone Encounter (Signed)
PA approved for Gabapentin #120 per 30d from 1.1.19-12.31.19 per [employer]/thx dmf

## 2017-12-21 ENCOUNTER — Other Ambulatory Visit: Payer: Self-pay | Admitting: Internal Medicine

## 2017-12-21 DIAGNOSIS — H35352 Cystoid macular degeneration, left eye: Secondary | ICD-10-CM | POA: Diagnosis not present

## 2018-01-04 ENCOUNTER — Ambulatory Visit (INDEPENDENT_AMBULATORY_CARE_PROVIDER_SITE_OTHER): Payer: Medicare Other | Admitting: Family Medicine

## 2018-01-04 ENCOUNTER — Encounter: Payer: Self-pay | Admitting: Family Medicine

## 2018-01-04 VITALS — BP 124/64 | HR 89 | Temp 98.5°F | Ht 72.0 in | Wt 258.8 lb

## 2018-01-04 DIAGNOSIS — J069 Acute upper respiratory infection, unspecified: Secondary | ICD-10-CM | POA: Diagnosis not present

## 2018-01-04 MED ORDER — HYDROCODONE-HOMATROPINE 5-1.5 MG/5ML PO SYRP: 5.0000 mL | ORAL_SOLUTION | Freq: Three times a day (TID) | ORAL | 0 refills | Status: AC | PRN

## 2018-01-04 MED ORDER — AZITHROMYCIN 250 MG PO TABS: ORAL_TABLET | ORAL | 0 refills | Status: DC

## 2018-01-04 NOTE — Patient Instructions (Signed)
Great to see you. Take zpack as directed.  Hycodan as needed at night for severe cough.

## 2018-01-04 NOTE — Progress Notes (Signed)
SUBJECTIVE:  Nicholas [name] is a 77 y.o. male who complains of coryza, congestion, sneezing, sore throat, swollen glands, productive cough and headache for 3 days. He denies a history of anorexia, chest pain and chills and denies a history of asthma. Patient denies smoke cigarettes.   Current Outpatient Medications on File Prior to Visit  Medication Sig Dispense Refill  . albuterol (PROVENTIL HFA;VENTOLIN HFA) 108 (90 Base) MCG/ACT inhaler Inhale 2 puffs into the lungs every 6 (six) hours as needed for wheezing or shortness of breath. 1 Inhaler 0  . albuterol (PROVENTIL) (2.5 MG/3ML) 0.083% nebulizer solution Take 3 mLs (2.5 mg total) by nebulization every 6 (six) hours as needed for wheezing or shortness of breath. 360 mL 5  . ANORO ELLIPTA 62.5-25 MCG/INH AEPB TAKE 1 PUFF BY MOUTH EVERY DAY 60 each 0  . Blood Glucose Monitoring Suppl (ONE TOUCH ULTRA SYSTEM KIT) W/DEVICE KIT 1 kit by Does not apply route once. Use as directed to test blood sugar once daily  Dx:E11.49 1 each 0  . DUREZOL 0.05 % EMUL INSTILL 1 DROP INTO RIGHT EYE TWICE A DAY  5  . fluticasone (FLONASE) 50 MCG/ACT nasal spray PLACE 2 SPRAYS INTO BOTH NOSTRILS DAILY. 50 g 4  . gabapentin (NEURONTIN) 400 MG capsule Take 1 po qid 120 capsule 2  . glipiZIDE (GLUCOTROL XL) 5 MG 24 hr tablet Take 2 tablets (10 mg total) by mouth 2 (two) times daily before a meal. 360 tablet 3  . glucose blood (ONE TOUCH ULTRA TEST) test strip TEST 2 TIMES A DAY DX: E11.4 200 each 1  . Lancets (ONETOUCH ULTRASOFT) lancets Use as instructed 100 each 3  . linagliptin (TRADJENTA) 5 MG TABS tablet Take 1 tablet (5 mg total) by mouth daily before breakfast. 30 tablet 11  . metFORMIN (GLUCOPHAGE) 500 MG tablet TAKE 1 TABLET BY MOUTH 2 TIMES DAILY WITH A MEAL. 180 tablet 1  . naproxen sodium (ANAPROX) 220 MG tablet Take 220 mg by mouth 2 (two) times daily as needed. For pain    . Tamsulosin HCl (FLOMAX) 0.4 MG CAPS Take 0.8 mg by mouth at bedtime.     . traMADol  (ULTRAM) 50 MG tablet TAKE 1 TABLET BY MOUTH EVERY 6 HOURS AS NEEDED FOR PAIN 120 tablet 2  . [DISCONTINUED] hydrochlorothiazide (MICROZIDE) 12.5 MG capsule Take 1 capsule (12.5 mg total) by mouth daily. 30 capsule 0   No current facility-administered medications on file prior to visit.     Allergies  Allergen Reactions  . Sudafed [Pseudoephedrine Hcl]     "makes me feel drunk"    Past Medical History:  Diagnosis Date  . Arthritis   . Blister of foot or toe    left ssecond toe healing per note of 09/21/12   . BPH (benign prostatic hypertrophy)   . COPD (chronic obstructive pulmonary disease) ([place])   . Descending colostomy s/p reversal 09/27/12 09/28/2012  . Diabetes mellitus    boarderline  . Diabetic foot ulcer ([place]) 09/22/2015  . Diverticulitis of colon with perforation 12/24/2011  . Heart murmur    noted on visit of 09/20/12- [facility]   . Neuropathy   . OSA on CPAP    cpap- doe snot know settings   . Pneumonia    hx. of  . Shortness of breath dyspnea   . Sinus infection    dx 12/13   . Ventral hernia 2013    Past Surgical History:  Procedure Laterality Date  . APPENDECTOMY  12/23/2011   Procedure: APPENDECTOMY;  Surgeon: Odis Hollingshead, MD;  Location: [facility];  Service: General;  Laterality: N/A;  . bilateral hand surgery      metal placed in hands due to trauma in 2011   . CARDIAC CATHETERIZATION     [facility]  . CHOLECYSTECTOMY    . COLOSTOMY REVISION  12/23/2011   Procedure: COLON RESECTION SIGMOID;  Surgeon: Odis Hollingshead, MD;  [facility];  Service: General;  Laterality: N/A;  . COLOSTOMY TAKEDOWN  09/27/2012   Procedure: LAPAROSCOPIC COLOSTOMY TAKEDOWN;  Surgeon: Odis Hollingshead, MD;  [facility];  Service: General;  Laterality: N/A;  Laparoscopic Colostomy Takedown  . EYE SURGERY Bilateral 2018   cataract extraction  . hartmann procedure    . INCISE AND DRAIN ABCESS     sebaceous cyst lanced in office on lt shoulder  . INSERTION OF MESH N/A  11/19/2015   Procedure: INSERTION OF MESH;  Surgeon: Jackolyn Confer, MD;  [facility];  Service: General;  Laterality: N/A;  . LAPAROTOMY  12/23/2011   Procedure: EXPLORATORY LAPAROTOMY;  Surgeon: Odis Hollingshead, MD;  [facility];  Service: General;  Laterality: N/A;  . right shoulder surgery      related to trauma   . VENTRAL HERNIA REPAIR N/A 11/19/2015   Procedure: LAPAROSCOPIC LYSIS OF ADHSIONS, VENTRAL HERNIA REPAIR WITH MESH;  Surgeon: Jackolyn Confer, MD;  [facility];  Service: General;  Laterality: N/A;    Family History  Problem Relation Age of Onset  . Diabetes Mother   . Cancer Mother        skin  . Heart disease Mother   . Heart attack Mother   . Cancer Father        Leukemia  . Colon cancer Neg Hx     Social History   Socioeconomic History  . Marital status: Married    Spouse name: Not on file  . Number of children: Not on file  . Years of education: Not on file  . Highest education level: Not on file  Occupational History  . Occupation: Counsellor: RETIRED  Social Needs  . Financial resource strain: Not on file  . Food insecurity:    Worry: Not on file    Inability: Not on file  . Transportation needs:    Medical: Not on file    Non-medical: Not on file  Tobacco Use  . Smoking status: Former Smoker    Packs/day: 2.00    Years: 40.00    Pack years: 80.00    Types: Cigarettes    Last attempt to quit: 10/10/1993    Years since quitting: 24.2  . Smokeless tobacco: Former Network engineer and Sexual Activity  . Alcohol use: No    Alcohol/week: 0.0 oz  . Drug use: No  . Sexual activity: Never  Lifestyle  . Physical activity:    Days per week: Not on file    Minutes per session: Not on file  . Stress: Not on file  Relationships  . Social connections:    Talks on phone: Not on file    Gets together: Not on file    Attends religious service: Not on file    Active member of club or organization: Not on file     Attends meetings of clubs or organizations: Not on file    Relationship status: Not on file  . Intimate partner violence:    Fear of current  or ex partner: Not on file    Emotionally abused: Not on file    Physically abused: Not on file    Forced sexual activity: Not on file  Other Topics Concern  . Not on file  Social History Narrative  . Not on file   The PMH, PSH, Social History, Family History, Medications, and allergies have been reviewed in [facility], and have been updated if relevant.  OBJECTIVE: BP 124/64 (BP Location: Left Arm, Patient Position: Sitting, Cuff Size: Normal)   Pulse 89   Temp 98.5 F (36.9 C) (Oral)   Ht 6' (1.829 m)   Wt 258 lb 12.8 oz (117.4 kg)   SpO2 94%   BMI 35.10 kg/m   He appears well, vital signs are as noted. Ears normal.  Throat and pharynx normal.  Neck supple. No adenopathy in the neck. Nose is congested. Sinuses non tender. The chest is clear, without wheezes or rales.  ASSESSMENT:  sinusitis  PLAN: Due to abrupt progression of symptoms, will treat for sinusitis/URI with zpack, as needed hyocdan. Symptomatic therapy suggested: push fluids, rest and return office visit prn if symptoms persist or worsen. Call or return to clinic prn if these symptoms worsen or fail to improve as anticipated.

## 2018-01-18 DIAGNOSIS — H35352 Cystoid macular degeneration, left eye: Secondary | ICD-10-CM | POA: Diagnosis not present

## 2018-01-18 DIAGNOSIS — H4041X2 Glaucoma secondary to eye inflammation, right eye, moderate stage: Secondary | ICD-10-CM | POA: Diagnosis not present

## 2018-01-23 ENCOUNTER — Ambulatory Visit: Payer: Medicare Other

## 2018-01-29 ENCOUNTER — Ambulatory Visit: Payer: Medicare Other

## 2018-01-30 NOTE — Progress Notes (Signed)
wORKS   Subjective:   Nicholas [name] is a 77 y.o. male who presents for Medicare Annual/Subsequent preventive examination. Works 50-55 hrs/ week  Review of Systems:  No ROS.  Medicare Wellness Visit. Additional risk factors are reflected in the social history.    Sleep patterns: wakes 1-2x to urinate.Wears cpap. Sleeps 8 hrs.  Home Safety/Smoke Alarms: Feels safe in home. Smoke alarms in place.  Living environment; residence and Firearm Safety: Lives with wife in 1 story home.    Male:   CCS- 04/18/12. Recall 10 yrs    PSA-  Lab Results  Component Value Date   PSA 3.56 01/20/2017       Objective:    Vitals: BP 140/68 (BP Location: Left Arm, Patient Position: Sitting, Cuff Size: Normal)   Pulse 77   Ht 6' (1.829 m)   Wt 262 lb 6.4 oz (119 kg)   SpO2 96%   BMI 35.59 kg/m   Body mass index is 35.59 kg/m.  Advanced Directives 01/31/2018 01/20/2017 01/18/2016 11/19/2015 11/19/2015 11/16/2015 07/09/2015  Does Patient Have a Medical Advance Directive? No No Yes Yes No No No  Type of Advance Directive - - Living will [place];Living will - - -  Does patient want to make changes to medical advance directive? - - - No - Patient declined - - -  Copy of [place] in Chart? - - No - copy requested No - copy requested - - -  Would patient like information on creating a medical advance directive? No - Patient declined - No - patient declined information - No - patient declined information No - patient declined information No - patient declined information  Pre-existing out of facility DNR order (yellow form or pink MOST form) - - - - - - -    Tobacco Social History   Tobacco Use  Smoking Status Former Smoker  . Packs/day: 2.00  . Years: 40.00  . Pack years: 80.00  . Types: Cigarettes  . Last attempt to quit: 10/10/1993  . Years since quitting: 24.3  Smokeless Tobacco Former Engineer, structural given: Not Answered   Clinical Intake: Pain :  No/denies pain    Past Medical History:  Diagnosis Date  . Arthritis   . Blister of foot or toe    left ssecond toe healing per note of 09/21/12   . BPH (benign prostatic hypertrophy)   . COPD (chronic obstructive pulmonary disease) ([place])   . Descending colostomy s/p reversal 09/27/12 09/28/2012  . Diabetes mellitus    boarderline  . Diabetic foot ulcer ([place]) 09/22/2015  . Diverticulitis of colon with perforation 12/24/2011  . Heart murmur    noted on visit of 09/20/12- [facility]   . Neuropathy   . OSA on CPAP    cpap- doe snot know settings   . Pneumonia    hx. of  . Shortness of breath dyspnea   . Sinus infection    dx 12/13   . Ventral hernia 2013   Past Surgical History:  Procedure Laterality Date  . APPENDECTOMY  12/23/2011   Procedure: APPENDECTOMY;  Surgeon: Odis Hollingshead, MD;  [facility];  Service: General;  Laterality: N/A;  . bilateral hand surgery      metal placed in hands due to trauma in 2011   . CARDIAC CATHETERIZATION     [facility]  . CHOLECYSTECTOMY    . COLOSTOMY REVISION  12/23/2011   Procedure: COLON RESECTION SIGMOID;  Surgeon: Odis Hollingshead, MD;  [facility];  Service: General;  Laterality: N/A;  . COLOSTOMY TAKEDOWN  09/27/2012   Procedure: LAPAROSCOPIC COLOSTOMY TAKEDOWN;  Surgeon: Odis Hollingshead, MD;  [facility];  Service: General;  Laterality: N/A;  Laparoscopic Colostomy Takedown  . EYE SURGERY Bilateral 2018   cataract extraction  . hartmann procedure    . INCISE AND DRAIN ABCESS     sebaceous cyst lanced in office on lt shoulder  . INSERTION OF MESH N/A 11/19/2015   Procedure: INSERTION OF MESH;  Surgeon: Jackolyn Confer, MD;  [facility];  Service: General;  Laterality: N/A;  . LAPAROTOMY  12/23/2011   Procedure: EXPLORATORY LAPAROTOMY;  Surgeon: Odis Hollingshead, MD;  [facility];  Service: General;  Laterality: N/A;  . right shoulder surgery      related to trauma   . VENTRAL HERNIA REPAIR N/A 11/19/2015    Procedure: LAPAROSCOPIC LYSIS OF ADHSIONS, VENTRAL HERNIA REPAIR WITH MESH;  Surgeon: Jackolyn Confer, MD;  [facility];  Service: General;  Laterality: N/A;   Family History  Problem Relation Age of Onset  . Diabetes Mother   . Cancer Mother        skin  . Heart disease Mother   . Heart attack Mother   . Cancer Father        Leukemia  . Colon cancer Neg Hx    Social History   Socioeconomic History  . Marital status: Married    Spouse name: Not on file  . Number of children: Not on file  . Years of education: Not on file  . Highest education level: Not on file  Occupational History  . Occupation: Counsellor: RETIRED  Social Needs  . Financial resource strain: Not on file  . Food insecurity:    Worry: Not on file    Inability: Not on file  . Transportation needs:    Medical: Not on file    Non-medical: Not on file  Tobacco Use  . Smoking status: Former Smoker    Packs/day: 2.00    Years: 40.00    Pack years: 80.00    Types: Cigarettes    Last attempt to quit: 10/10/1993    Years since quitting: 24.3  . Smokeless tobacco: Former Network engineer and Sexual Activity  . Alcohol use: No    Alcohol/week: 0.0 oz  . Drug use: No  . Sexual activity: Never  Lifestyle  . Physical activity:    Days per week: Not on file    Minutes per session: Not on file  . Stress: Not on file  Relationships  . Social connections:    Talks on phone: Not on file    Gets together: Not on file    Attends religious service: Not on file    Active member of club or organization: Not on file    Attends meetings of clubs or organizations: Not on file    Relationship status: Not on file  Other Topics Concern  . Not on file  Social History Narrative  . Not on file    Outpatient Encounter Medications as of 01/31/2018  Medication Sig  . albuterol (PROVENTIL HFA;VENTOLIN HFA) 108 (90 Base) MCG/ACT inhaler Inhale 2 puffs into the lungs every 6 (six) hours as needed  for wheezing or shortness of breath.  Marland Kitchen albuterol (PROVENTIL) (2.5 MG/3ML) 0.083% nebulizer solution Take 3 mLs (2.5 mg total) by nebulization every 6 (six) hours as  needed for wheezing or shortness of breath.  Jearl Klinefelter ELLIPTA 62.5-25 MCG/INH AEPB TAKE 1 PUFF BY MOUTH EVERY DAY  . Blood Glucose Monitoring Suppl (ONE TOUCH ULTRA SYSTEM KIT) W/DEVICE KIT 1 kit by Does not apply route once. Use as directed to test blood sugar once daily  Dx:E11.49  . DUREZOL 0.05 % EMUL INSTILL 1 DROP INTO RIGHT EYE TWICE A DAY  . fluticasone (FLONASE) 50 MCG/ACT nasal spray PLACE 2 SPRAYS INTO BOTH NOSTRILS DAILY.  Marland Kitchen gabapentin (NEURONTIN) 400 MG capsule Take 1 po qid  . glipiZIDE (GLUCOTROL XL) 5 MG 24 hr tablet Take 2 tablets (10 mg total) by mouth 2 (two) times daily before a meal.  . glucose blood (ONE TOUCH ULTRA TEST) test strip TEST 2 TIMES A DAY DX: E11.4  . metFORMIN (GLUCOPHAGE) 500 MG tablet TAKE 1 TABLET BY MOUTH 2 TIMES DAILY WITH A MEAL.  . naproxen sodium (ANAPROX) 220 MG tablet Take 220 mg by mouth 2 (two) times daily as needed. For pain  . Tamsulosin HCl (FLOMAX) 0.4 MG CAPS Take 0.8 mg by mouth at bedtime.   . traMADol (ULTRAM) 50 MG tablet TAKE 1 TABLET BY MOUTH EVERY 6 HOURS AS NEEDED FOR PAIN  . dorzolamide-timolol (COSOPT) 22.3-6.8 MG/ML ophthalmic solution INSTILL 1 DROP TWICE A DAY INTO RIGHT EYE  . HYDROcodone-homatropine (HYCODAN) 5-1.5 MG/5ML syrup Take 5 mLs by mouth every 8 (eight) hours as needed for cough. (Patient not taking: Reported on 01/31/2018)  . Lancets (ONETOUCH ULTRASOFT) lancets Use as instructed (Patient not taking: Reported on 01/31/2018)  . linagliptin (TRADJENTA) 5 MG TABS tablet Take 1 tablet (5 mg total) by mouth daily before breakfast. (Patient not taking: Reported on 01/31/2018)  . prednisoLONE acetate (PRED FORTE) 1 % ophthalmic suspension INSTILL 1 DROP INTO THE RIGHT EYE TWICE A DAY  . [DISCONTINUED] azithromycin (ZITHROMAX) 250 MG tablet 2 tabs by mouth on day 1  followed by 1 tab by mouth daily days 2- 5  . [DISCONTINUED] hydrochlorothiazide (MICROZIDE) 12.5 MG capsule Take 1 capsule (12.5 mg total) by mouth daily.   No facility-administered encounter medications on file as of 01/31/2018.     Activities of Daily Living In your present state of health, do you have any difficulty performing the following activities: 01/31/2018  Hearing? N  Vision? N  Comment wears reading glasses. diabetic eye exam yearly with Dr.Dunn.   Difficulty concentrating or making decisions? N  Walking or climbing stairs? N  Dressing or bathing? N  Doing errands, shopping? N  Preparing Food and eating ? N  Using the Toilet? N  In the past six months, have you accidently leaked urine? N  Do you have problems with loss of bowel control? N  Managing your Medications? N  Managing your Finances? N  Housekeeping or managing your Housekeeping? N  Some recent data might be hidden    Patient Care Team: Lucille Passy, MD as PCP - General (Family Medicine) Jackolyn Confer, MD as Consulting Physician (General Surgery) Philemon Kingdom, MD as Consulting Physician (Internal Medicine) Juluis Rainier as Consulting Physician (Optometry)   Assessment:   This is a routine wellness examination for [facility]. Physical assessment deferred to PCP.   Exercise Activities and Dietary recommendations   Diet (meal preparation, eat out, water intake, caffeinated beverages, dairy products, fruits and vegetables): in general, a "healthy" diet  , well balanced      Goals    . Increase physical activity     Starting 01/20/2017, I  will continue to walk 2-3 miles daily.      . Patient Stated     Continue working and staying healthy       Fall Risk Fall Risk  01/31/2018 01/20/2017 01/18/2016 07/09/2015  Falls in the past year? No No No No     Depression Screen PHQ 2/9 Scores 01/31/2018 01/20/2017 01/18/2016 07/09/2015  PHQ - 2 Score 0 0 0 0    Cognitive Function Ad8 score reviewed for  issues:  Issues making decisions:no  Less interest in hobbies / activities:no  Repeats questions, stories (family complaining):no  Trouble using ordinary gadgets (microwave, computer, phone):no  Forgets the month or year: no  Mismanaging finances: no  Remembering appts:no  Daily problems with thinking and/or memory:no Ad8 score is=0     MMSE - Mini Mental State Exam 01/20/2017 01/18/2016  Orientation to time 5 5  Orientation to Place 5 5  Registration 3 3  Attention/ Calculation 0 0  Recall 1 3  Recall-comments pt was unable to recall 1 of 3 words -  Language- name 2 objects 0 0  Language- repeat 1 1  Language- follow 3 step command 3 3  Language- read & follow direction 0 0  Write a sentence 0 0  Copy design 0 0  Total score 18 20        Immunization History  Administered Date(s) Administered  . Influenza, Seasonal, Injecte, Preservative Fre 09/05/2012  . Influenza-Unspecified 09/29/2017  . Pneumococcal Conjugate-13 02/11/2015  . Pneumococcal Polysaccharide-23 09/05/2012  . Tdap 07/08/2014  . Zoster 09/13/2012    Screening Tests Health Maintenance  Topic Date Due  . HEMOGLOBIN A1C  07/22/2017  . OPHTHALMOLOGY EXAM  07/28/2017  . URINE MICROALBUMIN  01/20/2018  . FOOT EXAM  02/15/2018  . INFLUENZA VACCINE  05/10/2018  . COLONOSCOPY  04/18/2022  . TETANUS/TDAP  07/08/2024  . PNA vac Low Risk Adult  Completed      Plan:  Follow up PCP as directed  Continue to eat heart healthy diet (full of fruits, vegetables, whole grains, lean protein, water--limit salt, fat, and sugar intake) and increase physical activity as tolerated.  Continue doing brain stimulating activities (puzzles, reading, adult coloring books, staying active) to keep memory sharp.     I have personally reviewed and noted the following in the patient's chart:   . Medical and social history . Use of alcohol, tobacco or illicit drugs  . Current medications and  supplements . Functional ability and status . Nutritional status . Physical activity . Advanced directives . List of other physicians . Hospitalizations, surgeries, and ER visits in previous 12 months . Vitals . Screenings to include cognitive, depression, and falls . Referrals and appointments  In addition, I have reviewed and discussed with patient certain preventive protocols, quality metrics, and best practice recommendations. A written personalized care plan for preventive services as well as general preventive health recommendations were provided to patient.     Naaman Plummer [employer], [place]  01/31/2018

## 2018-01-31 ENCOUNTER — Encounter: Payer: Self-pay | Admitting: Behavioral Health

## 2018-01-31 ENCOUNTER — Ambulatory Visit (INDEPENDENT_AMBULATORY_CARE_PROVIDER_SITE_OTHER): Payer: Medicare Other | Admitting: Behavioral Health

## 2018-01-31 VITALS — BP 140/68 | HR 77 | Ht 72.0 in | Wt 262.4 lb

## 2018-01-31 DIAGNOSIS — Z0000 Encounter for general adult medical examination without abnormal findings: Secondary | ICD-10-CM | POA: Diagnosis not present

## 2018-01-31 NOTE — Patient Instructions (Signed)
Continue to eat heart healthy diet (full of fruits, vegetables, whole grains, lean protein, water--limit salt, fat, and sugar intake) and increase physical activity as tolerated.  Continue doing brain stimulating activities (puzzles, reading, adult coloring books, staying active) to keep memory sharp.    Nicholas [name] , Thank you for taking time to come for your Medicare Wellness Visit. I appreciate your ongoing commitment to your health goals. Please review the following plan we discussed and let me know if I can assist you in the future.   These are the goals we discussed: Goals    . Increase physical activity     Starting 01/20/2017, I will continue to walk 2-3 miles daily.      . Patient Stated     Continue working and staying healthy       This is a list of the screening recommended for you and due dates:  Health Maintenance  Topic Date Due  . Hemoglobin A1C  07/22/2017  . Eye exam for diabetics  07/28/2017  . Urine Protein Check  01/20/2018  . Complete foot exam   02/15/2018  . Flu Shot  05/10/2018  . Colon Cancer Screening  04/18/2022  . Tetanus Vaccine  07/08/2024  . Pneumonia vaccines  Completed    Health Maintenance, Male A healthy lifestyle and preventive care is important for your health and wellness. Ask your health care provider about what schedule of regular examinations is right for you. What should I know about weight and diet? Eat a Healthy Diet  Eat plenty of vegetables, fruits, whole grains, low-fat dairy products, and lean protein.  Do not eat a lot of foods high in solid fats, added sugars, or salt.  Maintain a Healthy Weight Regular exercise can help you achieve or maintain a healthy weight. You should:  Do at least 150 minutes of exercise each week. The exercise should increase your heart rate and make you sweat (moderate-intensity exercise).  Do strength-training exercises at least twice a week.  Watch Your Levels of Cholesterol and Blood  Lipids  Have your blood tested for lipids and cholesterol every 5 years starting at 76 years of age. If you are at high risk for heart disease, you should start having your blood tested when you are 76 years old. You may need to have your cholesterol levels checked more often if: ? Your lipid or cholesterol levels are high. ? You are older than 76 years of age. ? You are at high risk for heart disease.  What should I know about cancer screening? Many types of cancers can be detected early and may often be prevented. Lung Cancer  You should be screened every year for lung cancer if: ? You are a current smoker who has smoked for at least 30 years. ? You are a former smoker who has quit within the past 15 years.  Talk to your health care provider about your screening options, when you should start screening, and how often you should be screened.  Colorectal Cancer  Routine colorectal cancer screening usually begins at 76 years of age and should be repeated every 5-10 years until you are 76 years old. You may need to be screened more often if early forms of precancerous polyps or small growths are found. Your health care provider may recommend screening at an earlier age if you have risk factors for colon cancer.  Your health care provider may recommend using home test kits to check for hidden blood in the stool.  A small camera at the end of a tube can be used to examine your colon (sigmoidoscopy or colonoscopy). This checks for the earliest forms of colorectal cancer.  Prostate and Testicular Cancer  Depending on your age and overall health, your health care provider may do certain tests to screen for prostate and testicular cancer.  Talk to your health care provider about any symptoms or concerns you have about testicular or prostate cancer.  Skin Cancer  Check your skin from head to toe regularly.  Tell your health care provider about any new moles or changes in moles, especially  if: ? There is a change in a mole's size, shape, or color. ? You have a mole that is larger than a pencil eraser.  Always use sunscreen. Apply sunscreen liberally and repeat throughout the day.  Protect yourself by wearing long sleeves, pants, a wide-brimmed hat, and sunglasses when outside.  What should I know about heart disease, diabetes, and high blood pressure?  If you are 91-30 years of age, have your blood pressure checked every 3-5 years. If you are 33 years of age or older, have your blood pressure checked every year. You should have your blood pressure measured twice-once when you are at a hospital or clinic, and once when you are not at a hospital or clinic. Record the average of the two measurements. To check your blood pressure when you are not at a hospital or clinic, you can use: ? An automated blood pressure machine at a pharmacy. ? A home blood pressure monitor.  Talk to your health care provider about your target blood pressure.  If you are between 57-33 years old, ask your health care provider if you should take aspirin to prevent heart disease.  Have regular diabetes screenings by checking your fasting blood sugar level. ? If you are at a normal weight and have a low risk for diabetes, have this test once every three years after the age of 66. ? If you are overweight and have a high risk for diabetes, consider being tested at a younger age or more often.  A one-time screening for abdominal aortic aneurysm (AAA) by ultrasound is recommended for men aged 17-75 years who are current or former smokers. What should I know about preventing infection? Hepatitis B If you have a higher risk for hepatitis B, you should be screened for this virus. Talk with your health care provider to find out if you are at risk for hepatitis B infection. Hepatitis C Blood testing is recommended for:  Everyone born from [birth year] through 1964.  Anyone with known risk factors for hepatitis  C.  Sexually Transmitted Diseases (STDs)  You should be screened each year for STDs including gonorrhea and chlamydia if: ? You are sexually active and are younger than 76 years of age. ? You are older than 76 years of age and your health care provider tells you that you are at risk for this type of infection. ? Your sexual activity has changed since you were last screened and you are at an increased risk for chlamydia or gonorrhea. Ask your health care provider if you are at risk.  Talk with your health care provider about whether you are at high risk of being infected with HIV. Your health care provider may recommend a prescription medicine to help prevent HIV infection.  What else can I do?  Schedule regular health, dental, and eye exams.  Stay current with your vaccines (immunizations).  Do not use  any tobacco products, such as cigarettes, chewing tobacco, and e-cigarettes. If you need help quitting, ask your health care provider.  Limit alcohol intake to no more than 2 drinks per day. One drink equals 12 ounces of beer, 5 ounces of wine, or 1 ounces of hard liquor.  Do not use street drugs.  Do not share needles.  Ask your health care provider for help if you need support or information about quitting drugs.  Tell your health care provider if you often feel depressed.  Tell your health care provider if you have ever been abused or do not feel safe at home. This information is not intended to replace advice given to you by your health care provider. Make sure you discuss any questions you have with your health care provider. Document Released: 03/24/2008 Document Revised: 05/25/2016 Document Reviewed: 06/30/2015 Elsevier Interactive Patient Education  [employer].

## 2018-02-01 NOTE — Progress Notes (Signed)
Medical screening examination/treatment/procedure(s) were performed by the Wellness Coach, RN. As primary care provider I was immediately available for consulation/collaboration. I agree with above documentation. Marquel Spoto, AGNP-C 

## 2018-02-05 ENCOUNTER — Other Ambulatory Visit: Payer: Self-pay | Admitting: Physician Assistant

## 2018-02-05 ENCOUNTER — Ambulatory Visit
Admission: RE | Admit: 2018-02-05 | Discharge: 2018-02-05 | Disposition: A | Discharge status: Home / Self Care | Payer: Medicare Other | Source: Ambulatory Visit | Attending: Physician Assistant | Admitting: Physician Assistant

## 2018-02-05 ENCOUNTER — Other Ambulatory Visit
Admission: RE | Admit: 2018-02-05 | Discharge: 2018-02-05 | Disposition: A | Discharge status: Home / Self Care | Payer: Medicare Other | Source: Ambulatory Visit | Attending: Physician Assistant | Admitting: Physician Assistant

## 2018-02-05 ENCOUNTER — Encounter: Payer: Medicare Other | Attending: Physician Assistant | Admitting: Physician Assistant

## 2018-02-05 DIAGNOSIS — Z87891 Personal history of nicotine dependence: Secondary | ICD-10-CM | POA: Diagnosis not present

## 2018-02-05 DIAGNOSIS — B999 Unspecified infectious disease: Secondary | ICD-10-CM

## 2018-02-05 DIAGNOSIS — E1142 Type 2 diabetes mellitus with diabetic polyneuropathy: Secondary | ICD-10-CM | POA: Insufficient documentation

## 2018-02-05 DIAGNOSIS — E11610 Type 2 diabetes mellitus with diabetic neuropathic arthropathy: Secondary | ICD-10-CM | POA: Insufficient documentation

## 2018-02-05 DIAGNOSIS — E1151 Type 2 diabetes mellitus with diabetic peripheral angiopathy without gangrene: Secondary | ICD-10-CM | POA: Diagnosis not present

## 2018-02-05 DIAGNOSIS — L97512 Non-pressure chronic ulcer of other part of right foot with fat layer exposed: Secondary | ICD-10-CM | POA: Insufficient documentation

## 2018-02-05 DIAGNOSIS — N400 Enlarged prostate without lower urinary tract symptoms: Secondary | ICD-10-CM | POA: Insufficient documentation

## 2018-02-05 DIAGNOSIS — E11621 Type 2 diabetes mellitus with foot ulcer: Secondary | ICD-10-CM | POA: Insufficient documentation

## 2018-02-05 DIAGNOSIS — G4730 Sleep apnea, unspecified: Secondary | ICD-10-CM | POA: Diagnosis not present

## 2018-02-05 DIAGNOSIS — L97519 Non-pressure chronic ulcer of other part of right foot with unspecified severity: Secondary | ICD-10-CM | POA: Insufficient documentation

## 2018-02-05 DIAGNOSIS — E669 Obesity, unspecified: Secondary | ICD-10-CM | POA: Diagnosis not present

## 2018-02-05 DIAGNOSIS — S91102A Unspecified open wound of left great toe without damage to nail, initial encounter: Secondary | ICD-10-CM | POA: Diagnosis not present

## 2018-02-05 DIAGNOSIS — Z6835 Body mass index (BMI) 35.0-35.9, adult: Secondary | ICD-10-CM | POA: Insufficient documentation

## 2018-02-05 LAB — HEMOGLOBIN A1C
HEMOGLOBIN A1C: 6.3 % — AB (ref 4.8–5.6)
Mean Plasma Glucose: 134.11 mg/dL

## 2018-02-06 DIAGNOSIS — H44001 Unspecified purulent endophthalmitis, right eye: Secondary | ICD-10-CM | POA: Diagnosis not present

## 2018-02-08 NOTE — Progress Notes (Signed)
[name], [name] ([id number]) Visit Report for 02/05/2018 Chief Complaint Document Details Patient Name: Nicholas [name], [name] Date of Service: 02/05/2018 9:45 AM Medical Record Number: [id number] Patient Account Number: [id number] Date of Birth/Sex: Jan 09, 1941 (77 y.o. M) Treating RN: Primary Care Provider: Arnette Norris Other Clinician: Referring Provider: Referral, Self Treating Provider/Extender: Melburn Hake, Aylee Littrell Weeks in Treatment: 0 Information Obtained from: Patient Chief Complaint Right 1st toe ulcer Electronic Signature(s) Signed: 02/06/2018 8:17:44 AM By: Worthy Keeler PA-C Entered By: Worthy Keeler on 02/05/2018 10:43:18 Hetzer, Logun L. ([id number]) -------------------------------------------------------------------------------- Debridement Details Patient Name: [name], [name] Date of Service: 02/05/2018 9:45 AM Medical Record Number: [id number] Patient Account Number: [id number] Date of Birth/Sex: 03/03/1941 (77 y.o. M) Treating RN: Roger Shelter Primary Care Provider: Arnette Norris Other Clinician: Referring Provider: Referral, Self Treating Provider/Extender: Melburn Hake, Lyndee Herbst Weeks in Treatment: 0 Debridement Performed for Wound #6 Right,Plantar Toe Great Assessment: Performed By: Physician STONE III, Lanita Stammen E., PA-C Debridement Type: Debridement Severity of Tissue Pre Fat layer exposed Debridement: Pre-procedure Verification/Time Yes - 10:33 Out Taken: Start Time: 10:33 Pain Control: Other : lidocaine 4% Total Area Debrided (L x W): 1.1 (cm) x 1.2 (cm) = 1.32 (cm) Tissue and other material Viable, Non-Viable, Callus, Eschar, Slough, Subcutaneous, Skin: Dermis , Skin: Epidermis, debrided: Biofilm, Slough Level: Skin/Subcutaneous Tissue Debridement Description: Excisional Instrument: Curette Bleeding: Minimum Hemostasis Achieved: Pressure End Time: 10:35 Procedural Pain: 0 Post Procedural Pain: 0 Response to Treatment: Procedure was tolerated well Post Debridement  Measurements of Total Wound Length: (cm) 1.1 Width: (cm) 1.2 Depth: (cm) 1.2 Volume: (cm) 1.244 Character of Wound/Ulcer Post Debridement: Stable Severity of Tissue Post Debridement: Fat layer exposed Post Procedure Diagnosis Same as Pre-procedure Electronic Signature(s) Signed: 02/05/2018 5:23:19 PM By: Roger Shelter Signed: 02/06/2018 8:17:44 AM By: Worthy Keeler PA-C Entered By: Roger Shelter on 02/05/2018 10:35:33 Szumski, Antone L. ([id number]) -------------------------------------------------------------------------------- HPI Details Patient Name: [name], [name] Date of Service: 02/05/2018 9:45 AM Medical Record Number: [id number] Patient Account Number: [id number] Date of Birth/Sex: 21-Dec-1940 (77 y.o. M) Treating RN: Primary Care Provider: Arnette Norris Other Clinician: Referring Provider: Referral, Self Treating Provider/Extender: Melburn Hake, Jamillia Closson Weeks in Treatment: 0 History of Present Illness HPI Description: Pleasant 77 year old with history of diabetes (hemoglobin A1c 6.1 in November 2016), peripheral neuropathy, and sleep apnea. No history of PAD. Left ABI 1.01. He developed blisters and subsequent ulcerations on the plantar aspect of his left first and second toe in early December 2016. Denies any trauma, unusual activity, or new footwear. Has been fitted for orthotics in the past but did not like them. He is ambulating per his baseline without difficulty. No claudication or rest pain. 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 X-ray of left foot 09/23/2015 showed no evidence for osteomyelitis. Completed a course of doxycycline. Performing dressing changes with Prisma. Awaiting orthotics at [facility]. He returns to clinic for follow-up and is without new complaints. No significant pain. No fever or chills. No drainage. 01/03/17; this is a patient with type 2 diabetes well controlled with most recent hemoglobin A1c of 5.9. He does however have diabetic  peripheral neuropathy which is quite significant. Previously in this clinic with plantar ulcerations of his left first and second toe. He has done reasonably well although 2 weeks ago he noted some bleeding from the medial aspect of his right great toe. His daughter is apparently been working on this and is removed the medial part of the nail cuticle from the nailbed. There is still some bleeding. Because  of his diabetic neuropathy status he was concerned enough to come in and see [place] today. He does not have a podiatrist. He does not have known PAD. Last arterial studies were normal but these were in 2013. His ABI in this clinic was 1.0 on the right. 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 He does not wear diabetic shoes however he has been told issues he has are as good as that Readmission: 02/05/18 on evaluation today patient presents for initial evaluation concerning an issue which she first noted last Thursday, four days ago, when he arrived home and noted that he had a lot of blood in his shoe. Subsequently as it turned out it appears that an ulcer on the plantar aspect of his right great toe. He's not able to really see the bottom of this area and so was not aware of it or any issues with it prior. He states that his daughter cuts his toenails and "checks on his toes" for him. He states this was likely done the weekend before although he cannot remember exactly. He does sometimes where no shoes when in his home. Although not very often. He does not know of any injury that occurred to his great toe to cause this issue. He has previously had a plantar foot ulcer for which we have treated him but nothing recent. No fevers, chills, nausea, or vomiting noted at this time. Even at this point patient states that he's really having no pain he does have a history of neuropathy. The last hemoglobin A1c that I could see in the system with  was actually when  you're ago on 01/20/17 and was 7.3. I do not have anything recent other than that. 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 This does require a lot of walking and long days. He's previously had diabetic insoles which they paid quite a bit for although insurance cover the majority of this nonetheless actually made his feet hurt and did not help he does not wear them. Electronic Signature(s) Signed: 02/06/2018 8:17:44 AM By: Worthy Keeler PA-C Entered By: Worthy Keeler on 02/06/2018 08:13:54 Lucier, Sanford L. ([id number]) -------------------------------------------------------------------------------- Physical Exam Details Patient Name: Fulcher, Keldon L. Date of Service: 02/05/2018 9:45 AM Medical Record Number: [id number] Patient Account Number: [id number] Date of Birth/Sex: 12-May-1941 (77 y.o. M) Treating RN: Primary Care Provider: Arnette Norris Other Clinician: Referring Provider: Referral, Self Treating Provider/Extender: STONE III, Madsen Riddle Weeks in Treatment: 0 Constitutional patient is hypertensive.. pulse regular and within target range for patient.Marland Kitchen respirations regular, non-labored and within target range for patient.Marland Kitchen temperature within target range for patient.. Obese and well-hydrated in no acute distress. Eyes conjunctiva clear no eyelid edema noted. pupils equal round and reactive to light and accommodation. Ears, Nose, Mouth, and Throat no gross abnormality of ear auricles or external auditory canals. normal hearing noted during conversation. mucus membranes moist. Respiratory normal breathing without difficulty. clear to auscultation bilaterally. Cardiovascular regular rate and rhythm with normal S1, S2. Gastrointestinal (GI) soft, non-tender, non-distended, +BS. no ventral hernia  noted. Musculoskeletal normal gait and posture. no significant deformity or arthritic changes, no loss or range of motion, no clubbing. Psychiatric this patient is able to make decisions and demonstrates good insight into disease process. Alert and Oriented  x 3. pleasant and cooperative. Notes On inspection today patient had a on initial inspection seemingly superficial ulcer to the great toe. However upon further evaluation inspection along with using a skinny probe this actually probe down to bone. I subsequently brought his wife in for the remainder of the visit at this time as well so she can evaluate and see how the debridement progress. I also wanted her to see how the dressing change would be performance and she will be the one performing this at their home. Nonetheless this one did required debridement and post debridement was definitely deeper overall after cleaning the way the majority of the slough. There still some Slough to be taken care of as well although I think this will take a little time. Electronic Signature(s) Signed: 02/06/2018 8:17:44 AM By: Worthy Keeler PA-C Entered By: Worthy Keeler on 02/06/2018 08:12:13 Rennert, Krosby L. ([id number]) -------------------------------------------------------------------------------- Physician Orders Details Patient Name: Horn, Galdino L. Date of Service: 02/05/2018 9:45 AM Medical Record Number: [id number] Patient Account Number: [id number] Date of Birth/Sex: 10/14/1940 (77 y.o. M) Treating RN: Roger Shelter Primary Care Provider: Arnette Norris Other Clinician: Referring Provider: Referral, Self Treating Provider/Extender: Melburn Hake, Hubbard Seldon Weeks in Treatment: 0 Verbal / Phone Orders: No Diagnosis Coding ICD-10 Coding Code Description E11.621 Type 2 diabetes mellitus with foot ulcer L97.512 Non-pressure chronic ulcer of other part of right foot with fat layer exposed E11.42 Type 2 diabetes mellitus with diabetic  polyneuropathy Wound Cleansing Wound #6 Right,Plantar Toe Great o Clean wound with Normal Saline. Anesthetic (add to Medication List) Wound #6 Right,Plantar Toe Great o Topical Lidocaine 4% cream applied to wound bed prior to debridement (In Clinic Only). Primary Wound Dressing Wound #6 Right,Plantar Toe Great o Silver Alginate Secondary Dressing Wound #6 Right,Plantar Toe Great o Dry Gauze o Conform/Kerlix - felt cut to open over wound then wrap with conform and tape Dressing Change Frequency Wound #6 Right,Plantar Toe Great o Change dressing every day. Follow-up Appointments Wound #6 Right,Plantar Toe Great o Return Appointment in 1 week. Laboratory o Hemoglobin A1c (glycated HgB)/Hemoglobin.total in Blood (CHEM) oooo LOINC Code: 0254-2 oooo Convenience Name: Hb A1c -Method not specified Radiology o X-ray, foot - right great toe Demos, Maciah L. ([id number]) Electronic Signature(s) Signed: 02/05/2018 5:23:19 PM By: Roger Shelter Signed: 02/06/2018 8:17:44 AM By: Worthy Keeler PA-C Entered By: Roger Shelter on 02/05/2018 10:46:06 Ripoll, Dahir L. ([id number]) -------------------------------------------------------------------------------- Problem List Details Patient Name: Landstrom, Marquin L. Date of Service: 02/05/2018 9:45 AM Medical Record Number: [id number] Patient Account Number: [id number] Date of Birth/Sex: Aug 10, 1941 (77 y.o. M) Treating RN: Primary Care Provider: Arnette Norris Other Clinician: Referring Provider: Referral, Self Treating Provider/Extender: Melburn Hake, Jazara Swiney Weeks in Treatment: 0 Active Problems ICD-10 Impacting Encounter Code Description Active Date Wound Healing Diagnosis E11.621 Type 2 diabetes mellitus with foot ulcer 02/05/2018 Yes L97.512 Non-pressure chronic ulcer of other part of right foot with fat 02/05/2018 Yes layer exposed E11.42 Type 2 diabetes mellitus with diabetic polyneuropathy 02/05/2018 Yes Inactive Problems Resolved  Problems Electronic Signature(s) Signed: 02/06/2018 8:17:44 AM By: Worthy Keeler PA-C Entered By: Worthy Keeler on 02/05/2018 10:22:13 Lacey, Xyon L. ([id number]) -------------------------------------------------------------------------------- Progress Note/History and Physical Details Patient Name: Gerstner, Esequiel L. Date of Service: 02/05/2018 9:45 AM Medical Record Number: [id number] Patient Account Number: [id number] Date of Birth/Sex: Jan 23, 1941 (77 y.o. M) Treating RN: Primary Care Provider: Arnette Norris Other Clinician: Referring Provider: Referral, Self Treating Provider/Extender: Melburn Hake, Carine Nordgren Weeks in Treatment: 0 Subjective Chief Complaint Information obtained  from Patient Right 1st toe ulcer History of Present Illness (HPI) Pleasant 77 year old with history of diabetes (hemoglobin A1c 6.1 in November 2016), peripheral neuropathy, and sleep apnea. No history of PAD. Left ABI 1.01. He developed blisters and subsequent ulcerations on the plantar aspect of his left first and second toe in early December 2016. Denies any trauma, unusual activity, or new footwear. Has been fitted for orthotics in the past but did not like them. He is ambulating per his baseline without difficulty. No claudication or rest pain. 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 X-ray of left foot 09/23/2015 showed no evidence for osteomyelitis. Completed a course of doxycycline. Performing dressing changes with Prisma. Awaiting orthotics at [facility]. He returns to clinic for follow-up and is without new complaints. No significant pain. No fever or chills. No drainage. 01/03/17; this is a patient with type 2 diabetes well controlled with most recent hemoglobin A1c of 5.9. He does however have diabetic peripheral neuropathy which is quite significant. Previously in this clinic with plantar ulcerations of his left first and second toe. He has done reasonably well although 2 weeks ago he noted some  bleeding from the medial aspect of his right great toe. His daughter is apparently been working on this and is removed the medial part of the nail cuticle from the nailbed. There is still some bleeding. Because of his diabetic neuropathy status he was concerned enough to come in and see [place] today. He does not have a podiatrist. He does not have known PAD. Last arterial studies were normal but these were in 2013. His ABI in this clinic was 1.0 on the right. 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 He does not wear diabetic shoes however he has been told issues he has are as good as that Readmission: 02/05/18 on evaluation today patient presents for initial evaluation concerning an issue which she first noted last Thursday, four days ago, when he arrived home and noted that he had a lot of blood in his shoe. Subsequently as it turned out it appears that an ulcer on the plantar aspect of his right great toe. He's not able to really see the bottom of this area and so was not aware of it or any issues with it prior. He states that his daughter cuts his toenails and "checks on his toes" for him. He states this was likely done the weekend before although he cannot remember exactly. He does sometimes where no shoes when in his home. Although not very often. He does not know of any injury that occurred to his great toe to cause this issue. He has previously had a plantar foot ulcer for which we have treated him but nothing recent. No fevers, chills, nausea, or vomiting noted at this time. Even at this point patient states that he's really having no pain he does have a history of neuropathy. The last hemoglobin A1c that I could see in the system with  was actually when you're ago on 01/20/17 and was 7.3. I do not have anything recent other than that. 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 This does require a lot of walking and long days. He's previously had diabetic insoles which they paid quite a bit for although insurance  cover the majority of this nonetheless actually made his feet hurt and did not help he does not wear them. Wound History Patient presents with 1 open wound that has been present for approximately 1 week. Patient has been treating wound in the following manner: neosporin. Laboratory tests have not been performed in the last month. Patient reportedly has not tested Gribble, Trejon L. ([id number]) positive for an antibiotic resistant organism. Patient reportedly has not tested positive for osteomyelitis. Patient reportedly has not had testing performed to evaluate circulation in the legs. Patient History Information obtained from Patient. Allergies Sudafed, Betadine Family History Cancer - Mother,Father, Diabetes - Mother, Heart Disease - Mother, Hypertension - Mother, No family history of Hereditary Spherocytosis, Kidney Disease, Lung Disease, Seizures, Stroke, Thyroid Problems, Tuberculosis. Social History Former smoker - quit about 25 years ago, Marital Status - Married, Alcohol Use - Never, Drug Use - No History, Caffeine Use - Never. Medical History Eyes Patient has history of Cataracts - 2/18 Denies history of Glaucoma, Optic Neuritis Ear/Nose/Mouth/Throat Denies history of Chronic sinus problems/congestion, Middle ear problems Hematologic/Lymphatic Denies history of Anemia, Hemophilia, Human Immunodeficiency Virus, Lymphedema, Sickle Cell Disease Respiratory Patient has history of Chronic Obstructive Pulmonary Disease (COPD) Denies history of Aspiration, Asthma, Pneumothorax, Sleep Apnea, Tuberculosis Cardiovascular Denies history of Angina, Arrhythmia, Congestive Heart Failure, Coronary Artery Disease, Deep Vein  Thrombosis, Hypertension, Hypotension, Myocardial Infarction, Peripheral Arterial Disease, Peripheral Venous Disease, Phlebitis, Vasculitis Gastrointestinal Denies history of Cirrhosis , Colitis, Crohn s, Hepatitis A, Hepatitis B, Hepatitis C Endocrine Patient has history of Type II Diabetes Denies history of Type I Diabetes Immunological Denies history of Lupus Erythematosus, Raynaud s, Scleroderma Integumentary (Skin) Denies history of History of Burn, History of pressure wounds Musculoskeletal Patient has history of Osteoarthritis - hands Denies history of Gout, Rheumatoid Arthritis, Osteomyelitis Neurologic Patient has history of Neuropathy - hands and feet Denies history of Dementia, Quadriplegia, Paraplegia, Seizure Disorder Oncologic Denies history of Received Chemotherapy, Received Radiation Psychiatric Denies history of Anorexia/bulimia, Confinement Anxiety Patient is treated with Oral Agents. Blood sugar is tested. Blood sugar results noted at the following times: Breakfast - 174. Hospitalization/Surgery History - 09/09/2012, [facility], small bowel obstruction and colostomy. Medical And Surgical History Notes Constitutional Symptoms (General Health) Bert, Eliu L. ([id number]) DM II; Genitourinary BPH Review of Systems (ROS) Eyes Denies complaints or symptoms of Dry Eyes, Vision Changes, Glasses / Contacts. Ear/Nose/Mouth/Throat Denies complaints or symptoms of Difficult clearing ears, Sinusitis. Hematologic/Lymphatic Denies complaints or symptoms of Bleeding / Clotting Disorders, Human Immunodeficiency Virus. Respiratory Denies complaints or symptoms of Chronic or frequent coughs, Shortness of Breath. Cardiovascular Denies complaints or symptoms of Chest pain, LE edema. Gastrointestinal Denies complaints or symptoms of Frequent diarrhea, Nausea, Vomiting. Immunological Denies complaints or symptoms of Hives, Itching. Integumentary (Skin) Complains or has  symptoms of Wounds. Denies complaints or symptoms of Bleeding or bruising tendency, Breakdown, Swelling. Musculoskeletal Denies complaints or symptoms of Muscle Pain, Muscle Weakness. Neurologic Denies complaints or symptoms of Numbness/parasthesias, Focal/Weakness. Psychiatric Denies complaints or symptoms of Anxiety, Claustrophobia. Objective Constitutional patient is hypertensive.. pulse regular and within target range for patient.Marland Kitchen respirations regular, non-labored and within target range for patient.Marland Kitchen temperature within target range for patient.. Obese and well-hydrated in no acute distress. Vitals Time Taken: 10:00 AM, Height: 72 in, Source: Measured, Weight: 264 lbs, Source: Measured, BMI: 35.8, Temperature: 97.8 F, Pulse: 70 bpm, Respiratory Rate: 18 breaths/min, Blood Pressure: 137/62 mmHg. Eyes conjunctiva clear no eyelid edema noted. pupils equal round and reactive to light and accommodation.  Ears, Nose, Mouth, and Throat no gross abnormality of ear auricles or external auditory canals. normal hearing noted during conversation. mucus membranes moist. Respiratory normal breathing without difficulty. clear to auscultation bilaterally. Cardiovascular regular rate and rhythm with normal S1, S2. Seely, Jadon L. ([id number]) Gastrointestinal (GI) soft, non-tender, non-distended, +BS. no ventral hernia noted. Musculoskeletal normal gait and posture. no significant deformity or arthritic changes, no loss or range of motion, no clubbing. Psychiatric this patient is able to make decisions and demonstrates good insight into disease process. Alert and Oriented x 3. pleasant and cooperative. General Notes: On inspection today patient had a on initial inspection seemingly superficial ulcer to the great toe. However upon further evaluation inspection along with using a skinny probe this actually probe down to bone. I subsequently brought his wife in for the remainder of the visit at this  time as well so she can evaluate and see how the debridement progress. I also wanted her to see how the dressing change would be performance and she will be the one performing this at their home. Nonetheless this one did required debridement and post debridement was definitely deeper overall after cleaning the way the majority of the slough. There still some Slough to be taken care of as well although I think this will take a little time. Integumentary (Hair, Skin) Wound #6 status is Open. Original cause of wound was Gradually Appeared. The wound is located on the [employer]. The wound measures 1.1cm length x 1.2cm width x 1.3cm depth; 1.037cm^2 area and 1.348cm^3 volume. There is bone and Fat Layer (Subcutaneous Tissue) Exposed exposed. There is no tunneling or undermining noted. There is a large amount of sanguinous drainage noted. The wound margin is distinct with the outline attached to the wound base. There is small (1-33%) red granulation within the wound bed. There is a large (67-100%) amount of necrotic tissue within the wound bed including Eschar and Adherent Slough. The periwound skin appearance exhibited: Maceration. Periwound temperature was noted as No Abnormality. Assessment Active Problems ICD-10 E11.621 - Type 2 diabetes mellitus with foot ulcer L97.512 - Non-pressure chronic ulcer of other part of right foot with fat layer exposed E11.42 - Type 2 diabetes mellitus with diabetic polyneuropathy Procedures Wound #6 Pre-procedure diagnosis of Wound #6 is a Diabetic Wound/Ulcer of the Lower Extremity located on the Right,Plantar Toe Great .Severity of Tissue Pre Debridement is: Fat layer exposed. There was a Excisional Skin/Subcutaneous Tissue Debridement with a total area of 1.32 sq cm performed by STONE III, Javious Hallisey E., PA-C. With the following instrument(s): Curette. to remove Viable and Non-Viable tissue/material Material removed includes Eschar, Callus, Subcutaneous  Tissue, and Slough, Skin: Dermis, Skin: Epidermis, Biofilm, and Slough after achieving pain control using Other (lidocaine 4%). No specimens were taken. A time out was conducted at 10:33, prior to the start of the procedure. A Minimum amount of bleeding was controlled with Pressure. The procedure was tolerated well with a pain level of 0 throughout and a pain level of 0 following the procedure. Post Debridement Measurements: 1.1cm length x 1.2cm width x 1.2cm depth; 1.244cm^3 volume. Character of Wound/Ulcer Post Debridement is stable. Severity of Tissue Post Debridement is: Fat layer exposed. Post procedure Diagnosis Wound #6: Same as Pre-Procedure Pearce, Glenda L. ([id number]) Plan Wound Cleansing: Wound #6 Right,Plantar Toe Great: Clean wound with Normal Saline. Anesthetic (add to Medication List): Wound #6 Right,Plantar Toe Great: Topical Lidocaine 4% cream applied to wound bed prior to debridement (In Clinic Only). Primary Wound  Dressing: Wound #6 Right,Plantar Toe Great: Silver Alginate Secondary Dressing: Wound #6 Right,Plantar Toe Great: Dry Gauze Conform/Kerlix - felt cut to open over wound then wrap with conform and tape Dressing Change Frequency: Wound #6 Right,Plantar Toe Great: Change dressing every day. Follow-up Appointments: Wound #6 Right,Plantar Toe Great: Return Appointment in 1 week. Laboratory ordered were: Hb A1c -Method not specified Radiology ordered were: X-ray, foot - right great toe I did go ahead and send the patient for an x-ray of his great toe. We will see what the shows and make a determination on the best treatment options going forward based on whether or not any evidence of osteomyelitis exist. He's in agreement with this plan. No antibiotics were started this point although a wound culture may be obtained in the future possibly even a bone culture if were able to do so depending on the x-ray findings. That would be ideal if need be. Otherwise we  will see him for reevaluation in one weeks time we will initiate a silver cell dressing per above. Please see above for specific wound care orders. We will see patient for re-evaluation in 1 week(s) here in the clinic. If anything worsens or changes patient will contact our office for additional recommendations. Electronic Signature(s) Signed: 02/06/2018 8:17:44 AM By: Worthy Keeler PA-C Entered By: Worthy Keeler on 02/06/2018 08:14:15 Lamontagne, Milan L. ([id number]) -------------------------------------------------------------------------------- ROS/PFSH Details Patient Name: Pozzi, Ernest L. Date of Service: 02/05/2018 9:45 AM Medical Record Number: [id number] Patient Account Number: [id number] Date of Birth/Sex: October 11, 1940 (77 y.o. M) Treating RN: Montey Hora Primary Care Provider: Arnette Norris Other Clinician: Referring Provider: Referral, Self Treating Provider/Extender: Melburn Hake, Deontray Hunnicutt Weeks in Treatment: 0 Label Progress Note Print Version as History and Physical for this encounter Information Obtained From Patient Wound History Do you currently have one or more open woundso Yes How many open wounds do you currently haveo 1 Approximately how long have you had your woundso 1 week How have you been treating your wound(s) until nowo neosporin Has your wound(s) ever healed and then re-openedo No Have you had any lab work done in the past montho No Have you tested positive for an antibiotic resistant organism (MRSA, VRE)o No Have you tested positive for osteomyelitis (bone infection)o No Have you had any tests for circulation on your legso No Eyes Complaints and Symptoms: Negative for: Dry Eyes; Vision Changes; Glasses / Contacts Medical History: Positive for: Cataracts - 2/18 Negative for: Glaucoma; Optic Neuritis Ear/Nose/Mouth/Throat Complaints and Symptoms: Negative for: Difficult clearing ears; Sinusitis Medical History: Negative for: Chronic sinus problems/congestion;  Middle ear problems Hematologic/Lymphatic Complaints and Symptoms: Negative for: Bleeding / Clotting Disorders; Human Immunodeficiency Virus Medical History: Negative for: Anemia; Hemophilia; Human Immunodeficiency Virus; Lymphedema; Sickle Cell Disease Respiratory Complaints and Symptoms: Negative for: Chronic or frequent coughs; Shortness of Breath Medical History: Positive for: Chronic Obstructive Pulmonary Disease (COPD) Negative for: Aspiration; Asthma; Pneumothorax; Sleep Apnea; Tuberculosis Cardiovascular Noyes, Zaahir L. ([id number]) Complaints and Symptoms: Negative for: Chest pain; LE edema Medical History: Negative for: Angina; Arrhythmia; Congestive Heart Failure; Coronary Artery Disease; Deep Vein Thrombosis; Hypertension; Hypotension; Myocardial Infarction; Peripheral Arterial Disease; Peripheral Venous Disease; Phlebitis; Vasculitis Gastrointestinal Complaints and Symptoms: Negative for: Frequent diarrhea; Nausea; Vomiting Medical History: Negative for: Cirrhosis ; Colitis; Crohnos; Hepatitis A; Hepatitis B; Hepatitis C Immunological Complaints and Symptoms: Negative for: Hives; Itching Medical History: Negative for: Lupus Erythematosus; Raynaudos; Scleroderma Integumentary (Skin) Complaints and Symptoms: Positive for: Wounds Negative for: Bleeding or bruising tendency; Breakdown; Swelling Medical  History: Negative for: History of Burn; History of pressure wounds Musculoskeletal Complaints and Symptoms: Negative for: Muscle Pain; Muscle Weakness Medical History: Positive for: Osteoarthritis - hands Negative for: Gout; Rheumatoid Arthritis; Osteomyelitis Neurologic Complaints and Symptoms: Negative for: Numbness/parasthesias; Focal/Weakness Medical History: Positive for: Neuropathy - hands and feet Negative for: Dementia; Quadriplegia; Paraplegia; Seizure Disorder Psychiatric Complaints and Symptoms: Negative for: Anxiety; Claustrophobia Medical  History: Negative for: Anorexia/bulimia; Confinement Anxiety Stammen, Keifer L. ([id number]) Constitutional Symptoms (General Health) Medical History: Past Medical History Notes: DM II; Endocrine Medical History: Positive for: Type II Diabetes Negative for: Type I Diabetes Time with diabetes: 5 years Treated with: Oral agents Blood sugar tested every day: Yes Tested : BID Blood sugar testing results: Breakfast: 174 Genitourinary Medical History: Past Medical History Notes: BPH Oncologic Medical History: Negative for: Received Chemotherapy; Received Radiation HBO Extended History Items Eyes: Cataracts Immunizations Pneumococcal Vaccine: Received Pneumococcal Vaccination: Yes Immunization Notes: up to date Implantable Devices Hospitalization / Surgery History Name of Hospital Purpose of Hospitalization/Surgery Date [facility] small bowel obstruction and colostomy 09/09/2012 Family and Social History Cancer: Yes - Mother,Father; Diabetes: Yes - Mother; Heart Disease: Yes - Mother; Hereditary Spherocytosis: No; Hypertension: Yes - Mother; Kidney Disease: No; Lung Disease: No; Seizures: No; Stroke: No; Thyroid Problems: No; Tuberculosis: No; Former smoker - quit about 25 years ago; Marital Status - Married; Alcohol Use: Never; Drug Use: No History; Caffeine Use: Never; Financial Concerns: No; Food, Clothing or Shelter Needs: No; Support System Lacking: No; Transportation Concerns: No; Advanced Directives: No; Patient does not want information on Advanced Directives Electronic Signature(s) Signed: 02/05/2018 4:56:01 PM By: Montey Hora Signed: 02/06/2018 8:17:44 AM By: Worthy Keeler PA-C Entered By: Montey Hora on 02/05/2018 09:59:27 Mccaslin, Jerron L. ([id number]) Robins, Chaysen L. ([id number]) -------------------------------------------------------------------------------- SuperBill Details Patient Name: Riquelme, Chawn L. Date of Service: 02/05/2018 Medical Record Number:  [id number] Patient Account Number: [id number] Date of Birth/Sex: 29-Apr-1941 (77 y.o. M) Treating RN: Primary Care Provider: Arnette Norris Other Clinician: Referring Provider: Referral, Self Treating Provider/Extender: Melburn Hake, Bobetta Korf Weeks in Treatment: 0 Diagnosis Coding ICD-10 Codes Code Description E11.621 Type 2 diabetes mellitus with foot ulcer L97.512 Non-pressure chronic ulcer of other part of right foot with fat layer exposed E11.42 Type 2 diabetes mellitus with diabetic polyneuropathy Facility Procedures CPT4 Code: 46270350 Description: 09381 - DEB SUBQ TISSUE 20 SQ CM/< ICD-10 Diagnosis Description L97.512 Non-pressure chronic ulcer of other part of right foot with fat Modifier: layer exposed Quantity: 1 Physician Procedures CPT4 Code: 8299371 Description: 99214 - WC PHYS LEVEL 4 - EST PT ICD-10 Diagnosis Description E11.621 Type 2 diabetes mellitus with foot ulcer L97.512 Non-pressure chronic ulcer of other part of right foot with fat E11.42 Type 2 diabetes mellitus with diabetic  polyneuropathy Modifier: 25 layer exposed Quantity: 1 CPT4 Code: [id number] Description: 11042 - WC PHYS SUBQ TISS 20 SQ CM ICD-10 Diagnosis Description L97.512 Non-pressure chronic ulcer of other part of right foot with fat Modifier: layer exposed Quantity: 1 Electronic Signature(s) Signed: 02/06/2018 8:17:44 AM By: Worthy Keeler PA-C Entered By: Worthy Keeler on 02/06/2018 08:13:07

## 2018-02-08 NOTE — Progress Notes (Signed)
Olveda, Deontre L. ([id number]) Visit Report for 02/05/2018 Abuse/Suicide Risk Screen Details Patient Name: Nicholas [name], [name] Date of Service: 02/05/2018 9:45 AM Medical Record Number: [id number] Patient Account Number: [id number] Date of Birth/Sex: 06-Mar-1941 (77 y.o. M) Treating RN: Montey Hora Primary Care Jamyla Ard: Arnette Norris Other Clinician: Referring Rithy Mandley: Referral, Self Treating Kennith Morss/Extender: STONE III, HOYT Weeks in Treatment: 0 Abuse/Suicide Risk Screen Items Answer ABUSE/SUICIDE RISK SCREEN: Has anyone close to you tried to hurt or harm you recentlyo No Do you feel uncomfortable with anyone in your familyo No Has anyone forced you do things that you didnot want to doo No Do you have any thoughts of harming yourselfo No Patient displays signs or symptoms of abuse and/or neglect. No Electronic Signature(s) Signed: 02/05/2018 4:56:01 PM By: Montey Hora Entered By: Montey Hora on 02/05/2018 09:54:03 Nielsen, Ronney L. ([id number]) -------------------------------------------------------------------------------- Activities of Daily Living Details Patient Name: Delauter, Manveer L. Date of Service: 02/05/2018 9:45 AM Medical Record Number: [id number] Patient Account Number: [id number] Date of Birth/Sex: 03-28-1941 (77 y.o. M) Treating RN: Montey Hora Primary Care Munirah Doerner: Arnette Norris Other Clinician: Referring Darivs Lunden: Referral, Self Treating Holdan Stucke/Extender: Melburn Hake, HOYT Weeks in Treatment: 0 Activities of Daily Living Items Answer Activities of Daily Living (Please select one for each item) Drive Automobile Completely Able Take Medications Completely Able Use Telephone Completely Able Care for Appearance Completely Able Use Toilet Completely Able Bath / Shower Completely Able Dress Self Completely Able Feed Self Completely Able Walk Completely Able Get In / Out Bed Completely Able Housework Completely Able Prepare Meals Completely [place] for Self Completely Able Electronic Signature(s) Signed: 02/05/2018 4:56:01 PM By: Montey Hora Entered By: Montey Hora on 02/05/2018 09:55:04 [place], [place]. ([id number]) -------------------------------------------------------------------------------- Education Assessment Details Patient Name: Brashear, Denny L. Date of Service: 02/05/2018 9:45 AM Medical Record Number: [id number] Patient Account Number: [id number] Date of Birth/Sex: 1941/08/18 (77 y.o. M) Treating RN: Montey Hora Primary Care Maite Burlison: Arnette Norris Other Clinician: Referring Charise Leinbach: Referral, Self Treating Alaynna Kerwood/Extender: Melburn Hake, HOYT Weeks in Treatment: 0 Primary Learner Assessed: Patient Learning Preferences/Education Level/Primary Language Learning Preference: Explanation, Demonstration Highest Education Level: College or Above Preferred Language: English Cognitive Barrier Assessment/Beliefs Language Barrier: No Translator Needed: No Memory Deficit: No Emotional Barrier: No Cultural/Religious Beliefs Affecting Medical Care: No Physical Barrier Assessment Impaired Vision: No Impaired Hearing: No Decreased Hand dexterity: No Knowledge/Comprehension Assessment Knowledge Level: Medium Comprehension Level: Medium Ability to understand written Medium instructions: Ability to understand verbal Medium instructions: Motivation Assessment Anxiety Level: Calm Cooperation: Cooperative Education Importance: Acknowledges Need Interest in Health Problems: Asks Questions Perception: Coherent Willingness to Engage in Self- Medium Management Activities: Readiness to Engage in Self- Medium Management Activities: Electronic Signature(s) Signed: 02/05/2018 4:56:01 PM By: Montey Hora Entered By: Montey Hora on 02/05/2018 09:55:31 Stjames, Kaylen L. ([id number]) -------------------------------------------------------------------------------- Fall Risk Assessment Details Patient Name: [name],  [name] Date of Service: 02/05/2018 9:45 AM Medical Record Number: [id number] Patient Account Number: [id number] Date of Birth/Sex: [birth year] (77 y.o. M) Treating RN: Montey Hora Primary Care Lauranne Beyersdorf: Arnette Norris Other Clinician: Referring Anarely Nicholls: Referral, Self Treating Lela Murfin/Extender: Melburn Hake, HOYT Weeks in Treatment: 0 Fall Risk Assessment Items Have you had 2 or more falls in the last 12 monthso 0 No Have you had any fall that resulted in injury in the last 12 monthso 0 No FALL RISK ASSESSMENT: History of falling - immediate or within 3 months 0 No Secondary diagnosis 0 No Ambulatory aid None/bed rest/wheelchair/nurse 0 Yes Crutches/cane/walker 0 No Furniture  0 No IV Access/Saline Lock 0 No Gait/Training Normal/bed rest/immobile 0 Yes Weak 10 Yes Impaired 0 No Mental Status Oriented to own ability 0 Yes Electronic Signature(s) Signed: 02/05/2018 4:56:01 PM By: Montey Hora Entered By: Montey Hora on 02/05/2018 09:55:51 Withem, Vimal L. ([id number]) -------------------------------------------------------------------------------- Foot Assessment Details Patient Name: [name], [name] Date of Service: 02/05/2018 9:45 AM Medical Record Number: [id number] Patient Account Number: [id number] Date of Birth/Sex: 10/13/1940 (77 y.o. M) Treating RN: Montey Hora Primary Care Dezra Mandella: Arnette Norris Other Clinician: Referring Bilbo Carcamo: Referral, Self Treating Keino Placencia/Extender: Melburn Hake, HOYT Weeks in Treatment: 0 Foot Assessment Items Site Locations + = Sensation present, - = Sensation absent, C = Callus, U = Ulcer R = Redness, W = Warmth, M = Maceration, PU = Pre-ulcerative lesion F = Fissure, S = Swelling, D = Dryness Assessment Right: Left: Other Deformity: No No Prior Foot Ulcer: No No Prior Amputation: No No Charcot Joint: No No Ambulatory Status: Ambulatory Without Help Gait: Steady Electronic Signature(s) Signed: 02/05/2018 4:56:01 PM By: Montey Hora Entered By: Montey Hora on 02/05/2018 10:09:44 Palmisano, Leigh L. ([id number]) -------------------------------------------------------------------------------- Nutrition Risk Assessment Details Patient Name: Burr, Antoin L. Date of Service: 02/05/2018 9:45 AM Medical Record Number: [id number] Patient Account Number: [id number] Date of Birth/Sex: [birth year] (77 y.o. M) Treating RN: Montey Hora Primary Care Raha Tennison: Arnette Norris Other Clinician: Referring Salil Raineri: Referral, Self Treating Esterlene Atiyeh/Extender: STONE III, HOYT Weeks in Treatment: 0 Height (in): 72 Weight (lbs): 260 Body Mass Index (BMI): 35.3 Nutrition Risk Assessment Items NUTRITION RISK SCREEN: I have an illness or condition that made me change the kind and/or amount of 0 No food I eat I eat fewer than two meals per day 0 No I eat few fruits and vegetables, or milk products 0 No I have three or more drinks of beer, liquor or wine almost every day 0 No I have tooth or mouth problems that make it hard for me to eat 0 No I Danyael't always have enough money to buy the food I need 0 No I eat alone most of the time 0 No I take three or more different prescribed or over-the-counter drugs a day 1 Yes Without wanting to, I have lost or gained 10 pounds in the last six months 0 No I am not always physically able to shop, cook and/or feed myself 0 No Nutrition Protocols Good Risk Protocol 0 No interventions needed Moderate Risk Protocol Electronic Signature(s) Signed: 02/05/2018 4:56:01 PM By: Montey Hora Entered By: Montey Hora on 02/05/2018 09:55:59

## 2018-02-09 DIAGNOSIS — H44001 Unspecified purulent endophthalmitis, right eye: Secondary | ICD-10-CM | POA: Diagnosis not present

## 2018-02-10 NOTE — Progress Notes (Signed)
Fahrner, Cordelle L. ([id number]) Visit Report for 02/05/2018 Allergy List Details Patient Name: Nicholas [name], [name] Date of Service: 02/05/2018 9:45 AM Medical Record Number: [id number] Patient Account Number: [id number] Date of Birth/Sex: 1941/01/02 (77 y.o. M) Treating RN: Montey Hora Primary Care Cagney Steenson: Arnette Norris Other Clinician: Referring Delisa Finck: Referral, Self Treating Brynnlie Unterreiner/Extender: STONE III, HOYT Weeks in Treatment: 0 Allergies Active Allergies Sudafed Betadine Allergy Notes Electronic Signature(s) Signed: 02/05/2018 4:56:01 PM By: Montey Hora Entered By: Montey Hora on 02/05/2018 09:54:21 Fleeman, Tellis L. ([id number]) -------------------------------------------------------------------------------- Arrival Information Details Patient Name: Nicholas [name], [name] Date of Service: 02/05/2018 9:45 AM Medical Record Number: [id number] Patient Account Number: [id number] Date of Birth/Sex: 11/06/1940 (77 y.o. M) Treating RN: Montey Hora Primary Care Saya Mccoll: Arnette Norris Other Clinician: Referring Aalayah Riles: Referral, Self Treating Miya Luviano/Extender: Melburn Hake, HOYT Weeks in Treatment: 0 Visit Information Patient Arrived: Ambulatory Arrival Time: 09:52 Accompanied By: self Transfer Assistance: None Patient Identification Verified: Yes Secondary Verification Process Completed: Yes Patient Has Alerts: Yes Patient Alerts: DMII History Since Last Visit Added or deleted any medications: No Any new allergies or adverse reactions: No Had a fall or experienced change in activities of daily living that may affect risk of falls: No Signs or symptoms of abuse/neglect since last visito No Hospitalized since last visit: No Implantable device outside of the clinic excluding cellular tissue based products placed in the center since last visit: No Pain Present Now: No Electronic Signature(s) Signed: 02/05/2018 4:56:01 PM By: Montey Hora Entered By: Montey Hora on 02/05/2018  09:52:57 Peters, Tomothy L. ([id number]) -------------------------------------------------------------------------------- Encounter Discharge Information Details Patient Name: Nicholas [name], [name] Date of Service: 02/05/2018 9:45 AM Medical Record Number: [id number] Patient Account Number: [id number] Date of Birth/Sex: 1940/12/21 (77 y.o. M) Treating RN: Primary Care Arjuna Doeden: Arnette Norris Other Clinician: Referring Clements Toro: Referral, Self Treating Ylianna Almanzar/Extender: Melburn Hake, HOYT Weeks in Treatment: 0 Encounter Discharge Information Items Discharge Pain Level: 0 Discharge Condition: Stable Ambulatory Status: Ambulatory Discharge Destination: Home Transportation: Private Auto Accompanied By: wife Schedule Follow-up Appointment: Yes Medication Reconciliation completed and No provided to Patient/Care Marelin Tat: Provided on Clinical Summary of Care: 02/05/2018 Form Type Recipient Paper Patient [facility] Electronic Signature(s) Signed: 02/05/2018 10:54:29 AM By: Alric Quan Entered By: Alric Quan on 02/05/2018 10:54:28 Steers, Hill L. ([id number]) -------------------------------------------------------------------------------- Lower Extremity Assessment Details Patient Name: Nicholas [name], [name] Date of Service: 02/05/2018 9:45 AM Medical Record Number: [id number] Patient Account Number: [id number] Date of Birth/Sex: [birth year] (77 y.o. M) Treating RN: Montey Hora Primary Care Rajesh Wyss: Arnette Norris Other Clinician: Referring Syndey Jaskolski: Referral, Self Treating Rilen Shukla/Extender: Melburn Hake, HOYT Weeks in Treatment: 0 Edema Assessment Assessed: [Left: No] [Right: No] [Left: Edema] [Right: :] Calf Left: Right: Point of Measurement: 35 cm From Medial Instep 41 cm 42.1 cm Ankle Left: Right: Point of Measurement: 11 cm From Medial Instep 25.435 cm 26.4 cm Vascular Assessment Pulses: Dorsalis Pedis Palpable: [Left:Yes] [Right:Yes] Posterior Tibial Popliteal Doppler Audible:  [Left:Inaudible] Extremity colors, hair growth, and conditions: Extremity Color: [Left:Mottled] [Right:Mottled] Hair Growth on Extremity: [Left:Yes] [Right:Yes] Temperature of Extremity: [Left:Warm] [Right:Warm] Capillary Refill: [Left:< 3 seconds] [Right:< 3 seconds] Blood Pressure: Brachial: [Left:137] [Right:137] Dorsalis Pedis: 132 [Left:Dorsalis Pedis: 120] Ankle: Posterior Tibial: 140 [Left:Posterior Tibial: 122 1.02] [Right:0.89] Toe Nail Assessment Left: Right: Thick: Yes Yes Discolored: Yes Yes Deformed: Yes Yes Improper Length and Hygiene: Yes Yes Electronic Signature(s) Signed: 02/05/2018 4:56:01 PM By: Montey Hora Entered By: Montey Hora on 02/05/2018 10:16:13 Konen, Kaston L. ([id number]) -------------------------------------------------------------------------------- Multi Wound Chart Details Patient Name: Nicholas [name], [name] Date of Service:  02/05/2018 9:45 AM Medical Record Number: [id number] Patient Account Number: [id number] Date of Birth/Sex: 12-20-1940 (77 y.o. 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] Comorbid History: [6:Cataracts, Chronic Obstructive N/A Pulmonary Disease (COPD), Type II Diabetes, Osteoarthritis, Neuropathy] Date Acquired: [6:02/01/2018] [N/A:N/A] Weeks of Treatment: [6:0] [N/A:N/A] Wound Status: [6:Open] [N/A:N/A] Pending Amputation on [6:Yes] [N/A:N/A] Presentation: Measurements L x W x D [6:1.1x1.2x1.3]  [N/A:N/A] (cm) Area (cm) : [6:1.037] [N/A:N/A] Volume (cm) : [6:1.348] [N/A:N/A] % Reduction in Area: [6:0.00%] [N/A:N/A] % Reduction in Volume: [6:0.00%] [N/A:N/A] Classification: [6:Grade 2] [N/A:N/A] Exudate Amount: [6:Large] [N/A:N/A] Exudate Type: [6:Sanguinous] [N/A:N/A] Exudate Color: [6:red] [N/A:N/A] Wound Margin: [6:Distinct, outline attached] [N/A:N/A] Granulation Amount: [6:Small (1-33%)] [N/A:N/A] Granulation Quality: [6:Red] [N/A:N/A] Necrotic Amount: [6:Large (67-100%)] [N/A:N/A] Necrotic Tissue: [6:Eschar, Adherent Slough] [N/A:N/A] Exposed Structures: [6:Fat Layer (Subcutaneous Tissue) Exposed: Yes Bone: Yes] [N/A:N/A] Epithelialization: [6:None] [N/A:N/A] Periwound Skin Texture: [6:No Abnormalities Noted] [N/A:N/A] Periwound Skin Moisture: [6:Maceration: Yes] [N/A:N/A] Periwound Skin Color: [6:No Abnormalities Noted] [N/A:N/A] Temperature: [6:No Abnormality] [N/A:N/A] Tenderness on Palpation: No N/A N/A Wound Preparation: Ulcer Cleansing: N/A N/A Rinsed/Irrigated with Saline Topical Anesthetic Applied: Other: lidocaine 4% Treatment Notes Electronic Signature(s) Signed: 02/05/2018 5:23:19 PM By: Roger Shelter Entered By: Roger Shelter on 02/05/2018 10:29:20 Pewitt, Thoren L. ([id number]) -------------------------------------------------------------------------------- [place] Details Patient Name: Nicholas [name], [name] Date of Service: 02/05/2018 9:45 AM Medical Record Number: [id number] Patient Account Number: [id number] Date of Birth/Sex: 1941-01-18 (77 y.o. M) Treating RN: Roger Shelter Primary Care Rainier Feuerborn: Arnette Norris Other Clinician: Referring Jacub Waiters: Referral, Self Treating Ishaq Maffei/Extender: Melburn Hake, HOYT Weeks in Treatment: 0 Active Inactive ` Orientation to the Wound Care Program Nursing Diagnoses: Knowledge deficit related to the wound healing center program Goals: Patient/caregiver will verbalize understanding of  the [place] Program Date Initiated: 02/05/2018 Target Resolution Date: 02/26/2018 Goal Status: Active Interventions: Provide education on orientation to the wound center Notes: ` Wound/Skin Impairment Nursing Diagnoses: Impaired tissue integrity Goals: Patient/caregiver will verbalize understanding of skin care regimen Date Initiated: 02/05/2018 Target Resolution Date: 02/26/2018 Goal Status: Active Ulcer/skin breakdown will have a volume reduction of 30% by week 4 Date Initiated: 02/05/2018 Target Resolution Date: 02/26/2018 Goal Status: Active Interventions: Assess patient/caregiver ability to obtain necessary supplies Assess patient/caregiver ability to perform ulcer/skin care regimen upon admission and as needed Assess ulceration(s) every visit Treatment Activities: Skin care regimen initiated : 02/05/2018 Notes: Electronic Signature(s) Signed: 02/05/2018 5:23:19 PM By: Evette Cristal, Cowan L. ([id number]) Entered By: Roger Shelter on 02/05/2018 10:29:03 Sterry, Orestes L. ([id number]) -------------------------------------------------------------------------------- Pain Assessment Details Patient Name: Nicholas [name], [name] Date of Service: 02/05/2018 9:45 AM Medical Record Number: [id number] Patient Account Number: [id number] Date of Birth/Sex: 1940-12-14 (77 y.o. M) Treating RN: Montey Hora Primary Care Armstrong Creasy: Arnette Norris Other Clinician: Referring Cyanna Neace: Referral, Self Treating Morene Cecilio/Extender: Melburn Hake, HOYT Weeks in Treatment: 0 Active Problems Location of Pain Severity and Description of Pain Patient Has Paino No Site Locations Pain Management and Medication Current Pain Management: Electronic Signature(s) Signed: 02/05/2018 4:56:01 PM By: Montey Hora Entered By: Montey Hora on 02/05/2018 09:53:31 Musto, Salif L. ([id number]) -------------------------------------------------------------------------------- Patient/Caregiver Education  Details Patient Name: Nicholas [name], [name] Date of  Service: 02/05/2018 9:45 AM Medical Record Number: [id number] Patient Account Number: [id number] Date of Birth/Gender: 11-Jan-1941 (77 y.o. M) Treating RN: Ahmed Prima Primary Care Physician: Arnette Norris Other Clinician: Referring Physician: Referral, Self Treating Physician/Extender: Melburn Hake, HOYT Weeks in Treatment: 0 Education Assessment Education Provided To: Patient Education Topics Provided Wound/Skin Impairment: Handouts: Caring for Your Ulcer, Other: change dressing as ordered Methods: Demonstration, Explain/Verbal Responses: State content correctly Electronic Signature(s) Signed: 02/07/2018 4:27:21 PM By: Alric Quan Entered By: Alric Quan on 02/05/2018 10:54:47 Whitham, Delynn L. ([id number]) -------------------------------------------------------------------------------- Wound Assessment Details Patient Name: Nicholas [name], [name] Date of Service: 02/05/2018 9:45 AM Medical Record Number: [id number] Patient Account Number: [id number] Date of Birth/Sex: 12-Apr-1941 (77 y.o. M) Treating RN: Roger Shelter Primary Care Deborra Phegley: Arnette Norris Other Clinician: Referring Bettylou Frew: Referral, Self Treating Xaniyah Buchholz/Extender: STONE III, HOYT Weeks in Treatment: 0 Wound Status Wound Number: 6 Primary Diabetic Wound/Ulcer of the Lower Extremity Etiology: Wound Location: Right Toe Great - Plantar Wound Open Wounding Event: Gradually Appeared Status: Date Acquired: 02/01/2018 Comorbid Cataracts, Chronic Obstructive Pulmonary Weeks Of Treatment: 0 History: Disease (COPD), Type II Diabetes, Clustered Wound: No Osteoarthritis, Neuropathy Pending Amputation On Presentation Photos Photo Uploaded By: Montey Hora on 02/05/2018 10:49:23 Wound Measurements Length: (cm) 1.1 Width: (cm) 1.2 Depth: (cm) 1.3 Area: (cm) 1.037 Volume: (cm) 1.348 % Reduction in Area: 0% % Reduction in Volume: 0% Epithelialization: None Tunneling:  No Undermining: No Wound Description Classification: Grade 2 Wound Margin: Distinct, outline attached Exudate Amount: Large Exudate Type: Sanguinous Exudate Color: red Foul Odor After Cleansing: No Slough/Fibrino Yes Wound Bed Granulation Amount: Small (1-33%) Exposed Structure Granulation Quality: Red Fat Layer (Subcutaneous Tissue) Exposed: Yes Necrotic Amount: Large (67-100%) Bone Exposed: Yes Necrotic Quality: Eschar, Adherent Slough Periwound Skin Texture Texture Color No Abnormalities Noted: No No Abnormalities Noted: No Nicholas [name], [name] ([id number]) Moisture Temperature / Pain No Abnormalities Noted: No Temperature: No Abnormality Maceration: Yes Wound Preparation Ulcer Cleansing: Rinsed/Irrigated with Saline Topical Anesthetic Applied: Other: lidocaine 4%, Treatment Notes Wound #6 (Right, Plantar Toe Great) 1. Cleansed with: Clean wound with Normal Saline 2. Anesthetic Topical Lidocaine 4% cream to wound bed prior to debridement 4. Dressing Applied: Other dressing (specify in notes) 5. Secondary Dressing Applied Dry Gauze Kerlix/Conform 6. Footwear/Offloading device applied Surgical shoe 7. Secured with Tape Notes silvercel, felt Electronic Signature(s) Signed: 02/05/2018 5:23:19 PM By: Roger Shelter Entered By: Roger Shelter on 02/05/2018 10:27:52 Nicholas [name], [name] ([id number]) -------------------------------------------------------------------------------- Vitals Details Patient Name: Nicholas [name], [name] Date of Service: 02/05/2018 9:45 AM Medical Record Number: [id number] Patient Account Number: [id number] Date of Birth/Sex: [birth year] (77 y.o. M) Treating RN: Montey Hora Primary Care Nimsi Males: Arnette Norris Other Clinician: Referring Massiah Longanecker: Referral, Self Treating Jameila Keeny/Extender: STONE III, HOYT Weeks in Treatment: 0 Vital Signs Time Taken: 10:00 Temperature (F): 97.8 Height (in): 72 Pulse (bpm): 70 Source: Measured Respiratory Rate  (breaths/min): 18 Weight (lbs): 264 Blood Pressure (mmHg): 137/62 Source: Measured Reference Range: 80 - 120 mg / dl Body Mass Index (BMI): 35.8 Electronic Signature(s) Signed: 02/05/2018 4:56:01 PM By: Montey Hora Entered By: Montey Hora on 02/05/2018 10:00:50

## 2018-02-12 ENCOUNTER — Ambulatory Visit: Payer: Medicare Other | Admitting: Physician Assistant

## 2018-02-12 DIAGNOSIS — H44001 Unspecified purulent endophthalmitis, right eye: Secondary | ICD-10-CM | POA: Diagnosis not present

## 2018-02-15 ENCOUNTER — Encounter: Payer: Medicare Other | Attending: Physician Assistant | Admitting: Physician Assistant

## 2018-02-15 DIAGNOSIS — Z87891 Personal history of nicotine dependence: Secondary | ICD-10-CM | POA: Diagnosis not present

## 2018-02-15 DIAGNOSIS — J449 Chronic obstructive pulmonary disease, unspecified: Secondary | ICD-10-CM | POA: Diagnosis not present

## 2018-02-15 DIAGNOSIS — E11621 Type 2 diabetes mellitus with foot ulcer: Secondary | ICD-10-CM | POA: Diagnosis not present

## 2018-02-15 DIAGNOSIS — L97512 Non-pressure chronic ulcer of other part of right foot with fat layer exposed: Secondary | ICD-10-CM | POA: Diagnosis not present

## 2018-02-15 DIAGNOSIS — L97516 Non-pressure chronic ulcer of other part of right foot with bone involvement without evidence of necrosis: Secondary | ICD-10-CM | POA: Insufficient documentation

## 2018-02-15 DIAGNOSIS — G4730 Sleep apnea, unspecified: Secondary | ICD-10-CM | POA: Insufficient documentation

## 2018-02-15 DIAGNOSIS — Z7984 Long term (current) use of oral hypoglycemic drugs: Secondary | ICD-10-CM | POA: Insufficient documentation

## 2018-02-15 DIAGNOSIS — E1142 Type 2 diabetes mellitus with diabetic polyneuropathy: Secondary | ICD-10-CM | POA: Insufficient documentation

## 2018-02-19 ENCOUNTER — Ambulatory Visit: Payer: Medicare Other | Admitting: Physician Assistant

## 2018-02-19 NOTE — Progress Notes (Addendum)
Polo, Marqual L. ([id number]) Visit Report for 02/15/2018 Arrival Information Details Patient Name: Nicholas [name], [name] Date of Service: 02/15/2018 12:30 PM Medical Record Number: [id number] Patient Account Number: [id number] Date of Birth/Sex: [birth year] (77 y.o. M) Treating RN: Montey Hora Primary Care Jameison Haji: Arnette Norris Other Clinician: Referring Ariana Cavenaugh: Arnette Norris Treating Donnella Morford/Extender: Melburn Hake, HOYT Weeks in Treatment: 1 Visit Information History Since Last Visit Added or deleted any medications: No Patient Arrived: Ambulatory Any new allergies or adverse reactions: No Arrival Time: 12:43 Had a fall or experienced change in No Accompanied By: self activities of daily living that may affect Transfer Assistance: None risk of falls: Patient Identification Verified: Yes Signs or symptoms of abuse/neglect since last visito No Secondary Verification Process Completed: Yes Hospitalized since last visit: No Patient Has Alerts: Yes Implantable device outside of the clinic excluding No Patient Alerts: DMII cellular tissue based products placed in the center since last visit: Has Dressing in Place as Prescribed: Yes Pain Present Now: No Electronic Signature(s) Signed: 02/15/2018 4:34:09 PM By: Montey Hora Entered By: Montey Hora on 02/15/2018 12:43:21 Tietje, Atharva L. ([id number]) -------------------------------------------------------------------------------- Encounter Discharge Information Details Patient Name: Ennis, Tejuan L. Date of Service: 02/15/2018 12:30 PM Medical Record Number: [id number] Patient Account Number: [id number] Date of Birth/Sex: 1941/03/06 (77 y.o. M) Treating RN: Roger Shelter Primary Care Krina Mraz: Arnette Norris Other Clinician: Referring Biruk Troia: Arnette Norris Treating Crystallynn Noorani/Extender: Melburn Hake, HOYT Weeks in Treatment: 1 Encounter Discharge Information Items Discharge Condition: Stable Ambulatory Status: Ambulatory Discharge Destination:  Home Transportation: Private Auto Schedule Follow-up Appointment: Yes Clinical Summary of Care: Electronic Signature(s) Signed: 02/15/2018 5:11:28 PM By: Roger Shelter Entered By: Roger Shelter on 02/15/2018 13:20:36 Brass, Dezmin L. ([id number]) -------------------------------------------------------------------------------- Lower Extremity Assessment Details Patient Name: Rivers, Ayub L. Date of Service: 02/15/2018 12:30 PM Medical Record Number: [id number] Patient Account Number: [id number] Date of Birth/Sex: Dec 05, 1940 (77 y.o. M) Treating RN: Montey Hora Primary Care Taivon Haroon: Arnette Norris Other Clinician: Referring Doyle Tegethoff: Arnette Norris Treating Lailee Hoelzel/Extender: Melburn Hake, HOYT Weeks in Treatment: 1 Edema Assessment Assessed: [Left: No] [Right: No] Edema: [Left: Ye] [Right: s] Vascular Assessment Pulses: Dorsalis Pedis Palpable: [Right:Yes] Posterior Tibial Extremity colors, hair growth, and conditions: Extremity Color: [Right:Normal] Hair Growth on Extremity: [Right:Yes] Temperature of Extremity: [Right:Warm] Capillary Refill: [Right:< 3 seconds] Toe Nail Assessment Left: Right: Thick: Yes Discolored: Yes Deformed: No Improper Length and Hygiene: No Electronic Signature(s) Signed: 02/15/2018 4:34:09 PM By: Montey Hora Entered By: Montey Hora on 02/15/2018 12:51:32 Tecson, Royden L. ([id number]) -------------------------------------------------------------------------------- Multi Wound Chart Details Patient Name: Tassinari, Reino L. Date of Service: 02/15/2018 12:30 PM Medical Record Number: [id number] Patient Account Number: [id number] Date of Birth/Sex: [birth year] (77 y.o. 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] Comorbid History: [6:Cataracts, Chronic Obstructive N/A Pulmonary Disease (COPD), Type II Diabetes, Osteoarthritis, Neuropathy] Date Acquired: [6:02/01/2018] [N/A:N/A] Weeks of Treatment: [6:1] [N/A:N/A] Wound Status: [6:Open] [N/A:N/A] Pending Amputation on [6:Yes] [N/A:N/A] Presentation: Measurements L x W x D [6:0.7x0.6x0.6] [N/A:N/A] (cm) Area (cm) : [6:0.33] [N/A:N/A] Volume (cm) : [6:0.198] [N/A:N/A] % Reduction in Area: [6:68.20%] [N/A:N/A] % Reduction in Volume: [6:85.30%] [N/A:N/A] Classification: [6:Grade 2] [N/A:N/A] Exudate Amount: [6:Large] [N/A:N/A]  Exudate Type: [6:Sanguinous] [N/A:N/A] Exudate Color: [6:red] [N/A:N/A] Wound Margin: [6:Distinct, outline attached] [N/A:N/A] Granulation Amount: [6:Medium (34-66%)] [N/A:N/A] Granulation Quality: [6:Red] [N/A:N/A] Necrotic Amount: [6:Medium (34-66%)] [N/A:N/A] Exposed Structures: [6:Fat Layer (Subcutaneous Tissue) Exposed: Yes Bone: Yes] [N/A:N/A] Epithelialization: [6:None] [N/A:N/A] Periwound Skin Texture: [6:No Abnormalities Noted] [N/A:N/A] Periwound Skin Moisture: [6:Maceration: Yes] [N/A:N/A] Periwound Skin Color: [6:No Abnormalities Noted] [N/A:N/A] Temperature: [6:No Abnormality] [N/A:N/A] Tenderness on Palpation: [6:No] [N/A:N/A] Wound Preparation: Ulcer Cleansing: N/A N/A Rinsed/Irrigated with Saline Topical Anesthetic Applied: Other: lidocaine 4% Treatment Notes Electronic Signature(s) Signed: 02/16/2018 4:00:06 PM By: Alric Quan Entered By: Alric Quan on 02/15/2018 12:58:17 Rosemond, Olaoluwa L. ([id number]) -------------------------------------------------------------------------------- Multi-Disciplinary Care Plan  Details Patient Name: Vokes, Myers L. Date of Service: 02/15/2018 12:30 PM Medical Record Number: [id number] Patient Account Number: [id number] Date of Birth/Sex: Oct 23, 1940 (77 y.o. M) Treating RN: Ahmed Prima Primary Care Silviano Neuser: Arnette Norris Other Clinician: Referring Hameed Kolar: Arnette Norris Treating Janos Shampine/Extender: Melburn Hake, HOYT Weeks in Treatment: 1 Active Inactive Electronic Signature(s) Signed: 2018/03/18 3:58:02 PM By: Alric Quan Signed: Mar 18, 2018 4:06:44 PM By: Roger Shelter Previous Signature: 02/16/2018 4:00:06 PM Version By: Alric Quan Entered By: Roger Shelter on Mar 18, 2018 15:46:44 Rubenstein, Jarett L. ([id number]) -------------------------------------------------------------------------------- Pain Assessment Details Patient Name: Blow, Ruvim L. Date of Service: 02/15/2018 12:30 PM Medical Record Number: [id number] Patient Account Number: [id number] Date of Birth/Sex: [birth year] (77 y.o. M) Treating RN: Montey Hora Primary Care Macyn Remmert: Arnette Norris Other Clinician: Referring Jaclene Bartelt: Arnette Norris Treating Kaelene Elliston/Extender: Melburn Hake, HOYT Weeks in Treatment: 1 Active Problems Location of Pain Severity and Description of Pain Patient Has Paino Yes Site Locations Pain Location: Pain in Ulcers With Dressing Change: Yes Duration of the Pain. Constant / Intermittento Intermittent Character of Pain Describe the Pain: Throbbing Pain Management and Medication Current Pain Management: Goals for Pain Management "throbs a little" Electronic Signature(s) Signed: 02/15/2018 4:34:09 PM By: Montey Hora Entered By: Montey Hora on 02/15/2018 12:43:56 Baskerville, Keiron L. ([id number]) -------------------------------------------------------------------------------- Patient/Caregiver Education Details Patient Name: Sliwa, Kairos L. Date of Service: 02/15/2018 12:30 PM Medical Record Number: [id number] Patient Account Number: [id number] Date of Birth/Gender:  Feb 24, 1941 (77 y.o. M) Treating RN: Roger Shelter Primary Care Physician: Arnette Norris Other Clinician: Referring Physician: Arnette Norris Treating Physician/Extender: Melburn Hake, HOYT Weeks in Treatment: 1 Education Assessment Education Provided To: Patient Education Topics Provided Wound Debridement: Handouts: Wound Debridement Methods: Explain/Verbal Responses: State content correctly Wound/Skin Impairment: Handouts: Caring for Your Ulcer Methods: Explain/Verbal Responses: State content correctly Electronic Signature(s) Signed: 02/15/2018 5:11:28 PM By: Roger Shelter Entered By: Roger Shelter on 02/15/2018 13:20:53 Ritter, Sherley L. ([id number]) -------------------------------------------------------------------------------- Wound Assessment Details Patient Name: Vierra, Shondale L. Date of Service: 02/15/2018 12:30 PM Medical Record Number: [id number] Patient Account Number: [id number] Date of Birth/Sex: 11-19-1940 (77 y.o. M) Treating RN: Montey Hora Primary Care Harshil Cavallaro: Arnette Norris Other Clinician: Referring Lynda Capistran: Arnette Norris Treating Shakema Surita/Extender: Melburn Hake, HOYT Weeks in Treatment: 1 Wound Status Wound Number: 6 Primary Diabetic Wound/Ulcer of the Lower Extremity Etiology: Wound Location: Right Toe Great - Plantar Wound Open Wounding Event: Gradually Appeared Status: Date Acquired: 02/01/2018 Comorbid Cataracts, Chronic Obstructive Pulmonary Weeks Of Treatment: 1 History: Disease (COPD), Type II Diabetes, Clustered Wound: No Osteoarthritis, Neuropathy Pending Amputation On Presentation Photos Photo Uploaded By: Gretta Cool, BSN, RN, CWS, Kim on 02/15/2018 16:11:27 Wound Measurements Length: (cm) 0.7 Width: (cm) 0.6 Depth: (cm) 0.6 Area: (cm) 0.33 Volume: (cm) 0.198 % Reduction in Area: 68.2% % Reduction in Volume: 85.3% Epithelialization: None Tunneling: No Undermining: No Wound Description Classification: Grade 2 Wound Margin: Distinct, outline  attached Exudate Amount: Large Exudate Type: Sanguinous Exudate Color: red Foul Odor After Cleansing: No Slough/Fibrino Yes Wound Bed Granulation Amount: Medium (34-66%) Exposed Structure Granulation Quality: Red Fat Layer (Subcutaneous Tissue) Exposed: Yes Necrotic Amount: Medium (34-66%) Bone Exposed: Yes Necrotic Quality: Adherent Slough Periwound Skin Texture Texture Color No Abnormalities Noted: No No Abnormalities Noted: No Molock, Esaiah L. ([id number]) Moisture Temperature / Pain No Abnormalities Noted: No Temperature: No Abnormality Maceration: Yes Wound Preparation Ulcer Cleansing: Rinsed/Irrigated with Saline Topical Anesthetic Applied: Other: lidocaine 4%, Electronic Signature(s) Signed: 02/15/2018 4:34:09 PM By: Montey Hora Entered By: Montey Hora on 02/15/2018 12:49:44 Spaeth, Elden L. ([id number]) -------------------------------------------------------------------------------- Vitals Details Patient Name: Schwenke, Keeghan L. Date of Service: 02/15/2018 12:30 PM Medical Record Number: [id number] Patient Account Number: [id number] Date of Birth/Sex: 1941/03/01 (77 y.o. M) Treating RN: Montey Hora Primary Care Brooklee Michelin: Arnette Norris Other Clinician: Referring Izrael Peak: Arnette Norris Treating Tamara Kenyon/Extender: Melburn Hake, HOYT Weeks in Treatment: 1 Vital Signs Time Taken: 12:45 Temperature (F): 98.1 Height (in): 72 Pulse (bpm): 74 Weight (lbs): 264 Respiratory Rate (breaths/min): 18 Body Mass Index (BMI): 35.8 Blood Pressure (mmHg): 124/56 Reference Range: 80 - 120 mg / dl Electronic Signature(s) Signed: 02/15/2018 4:34:09 PM By: Montey Hora Entered By: Montey Hora on 02/15/2018 12:45:06

## 2018-02-19 NOTE — Progress Notes (Signed)
[name], [name] ([id number]) Visit Report for 02/15/2018 Chief Complaint Document Details Patient Name: [name], [name] Date of Service: 02/15/2018 12:30 PM Medical Record Number: [id number] Patient Account Number: [id number] Date of Birth/Sex: 1941-04-21 (77 y.o. M) Treating RN: Nicholas [name] Primary Care Provider: Arnette [name] Other Clinician: Referring Provider: Arnette [name] Treating Provider/Extender: Nicholas [name] Weeks in Treatment: 1 Information Obtained from: Patient Chief Complaint Right 1st toe ulcer Electronic Signature(s) Signed: 02/16/2018 2:32:11 PM By: Nicholas Keeler PA-C Entered By: Nicholas [name] on 02/15/2018 12:57:29 [name], [name] ([id number]) -------------------------------------------------------------------------------- Debridement Details Patient Name: [name], [name] Date of Service: 02/15/2018 12:30 PM Medical Record Number: [id number] Patient Account Number: [id number] Date of Birth/Sex: Jan 08, 1941 (77 y.o. M) Treating RN: Nicholas [name] Primary Care Provider: Arnette [name] Other Clinician: Referring Provider: Arnette [name] Treating Provider/Extender: Nicholas [name] Weeks in Treatment: 1 Debridement Performed for Wound #6 Right,Plantar Toe Great Assessment: Performed By: Physician Nicholas III, [name] E., PA-C Debridement Type: Debridement Severity of Tissue Pre Fat layer exposed Debridement: Pre-procedure Verification/Time Yes - 13:02 Out Taken: Start Time: 13:02 Pain Control: Lidocaine 4% Topical Solution Total Area Debrided (L x W): 0.7 (cm) x 0.6 (cm) = 0.42 (cm) Tissue and other material Viable, Non-Viable, Callus, Slough, Subcutaneous, Fibrin/Exudate, Slough debrided: Level: Skin/Subcutaneous Tissue Debridement Description: Excisional Instrument: Curette Bleeding: Minimum Hemostasis Achieved: Pressure End Time: 13:06 Procedural Pain: 0 Post Procedural Pain: 0 Response to Treatment: Procedure was tolerated well Level of Consciousness: Awake and  Alert Post Procedure Vitals: Temperature: 98.1 Pulse: 74 Respiratory Rate: 18 Blood Pressure: Systolic Blood Pressure: 626 Diastolic Blood Pressure: 56 Post Debridement Measurements of Total Wound Length: (cm) 0.7 Width: (cm) 0.6 Depth: (cm) 0.7 Volume: (cm) 0.231 Character of Wound/Ulcer Post Debridement: Requires Further Debridement Severity of Tissue Post Debridement: Fat layer exposed Post Procedure Diagnosis Same as Pre-procedure Electronic Signature(s) Signed: 02/16/2018 2:32:11 PM By: Nicholas Keeler PA-C Signed: 02/16/2018 4:00:06 PM By: Nicholas Shiver, [name] ([id number]) Entered By: Nicholas [name] on 02/15/2018 13:05:36 [name], [name] ([id number]) -------------------------------------------------------------------------------- HPI Details Patient Name: [name], [name] Date of Service: 02/15/2018 12:30 PM Medical Record Number: [id number] Patient Account Number: [id number] Date of Birth/Sex: 1941-03-28 (77 y.o. M) Treating RN: Nicholas [name] Primary Care Provider: Arnette [name] Other Clinician: Referring Provider: Arnette [name] Treating Provider/Extender: Nicholas [name] Weeks in Treatment: 1 History of Present Illness HPI Description: Pleasant 77 year old with history of diabetes (hemoglobin A1c 6.1 in November 2016), peripheral neuropathy, and sleep apnea. No history of PAD. Left ABI 1.01. He developed blisters and subsequent ulcerations on the plantar aspect of his left first and second toe in early December 2016. Denies any trauma, unusual activity, or new footwear. Has been fitted for orthotics in the past but did not like them. He is ambulating per his baseline without difficulty. No claudication or rest pain. 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 X-ray of left foot 09/23/2015 showed no evidence for osteomyelitis. Completed a course of doxycycline. Performing dressing changes with Prisma. Awaiting orthotics at [facility]. He returns to clinic  for follow-up and is without new complaints. No significant pain. No fever or chills. No drainage. 01/03/17; this is a patient with type 2 diabetes well controlled with most recent hemoglobin A1c of 5.9. He does however have diabetic peripheral neuropathy which is quite significant. Previously in this clinic with plantar ulcerations of his left first and second toe. He has done reasonably well although 2 weeks ago he noted some bleeding from the medial aspect of his  right great toe. His daughter is apparently been working on this and is removed the medial part of the nail cuticle from the nailbed. There is still some bleeding. Because of his diabetic neuropathy status he was concerned enough to come in and see [place] today. He does not have a podiatrist. He does not have known PAD. Last arterial studies were normal but these were in 2013. His ABI in this clinic was 1.0 on the right. 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 He does not wear diabetic shoes however he has been told issues he has are as good as that Readmission: 02/05/18 on evaluation today patient presents for initial evaluation concerning an issue which she first noted last Thursday, four days ago, when he arrived home and noted that he had a lot of blood in his shoe. Subsequently as it turned out it appears that an ulcer on the plantar aspect of his right great toe. He's not able to really see the bottom of this area and so was not aware of it or any issues with it prior. He states that his daughter cuts his toenails and "checks on his toes" for him. He states this was likely done the weekend before although he cannot remember exactly. He does sometimes where no shoes when in his home. Although not very often. He does not know of any injury that occurred to his great toe to cause this issue. He has previously had a plantar foot ulcer for which we have treated him but nothing recent. No fevers, chills,  nausea, or vomiting noted at this time. Even at this point patient states that he's really having no pain he does have a history of neuropathy. The last hemoglobin A1c that I could see in the system with [place] was actually when you're ago on 01/20/17 and was 7.3. I do not have anything recent other than that. 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 This does require a lot of walking and long days. He's previously had diabetic insoles which they paid quite a bit for although insurance cover the majority of this nonetheless actually made his feet hurt and did not help he does not wear them. 02/15/18 on evaluation today patient appears to be doing rather well in regard to his right great toe ulcer. He has been tolerating the dressing changes without complication. With that being said he did go have the x-ray performed which I did review today and showed no evidence of infection. This is good news. His hemoglobin A1c was also 6.3 and actually appear to be doing fairly well. I was here I think his diabetes is under fairly good control in my opinion. Fortunately appears to have no evidence of worsening symptoms as far as the ulcer is concerned. Electronic Signature(s) Signed: 02/16/2018 2:32:11 PM By: Nicholas Keeler PA-C Entered By: Nicholas [name] on 02/15/2018 13:13:15 Flamenco, Zekiel L. ([id number]) Hoskinson, Sundeep L. ([id number]) -------------------------------------------------------------------------------- Physical Exam Details Patient Name: [name], [name] Date of Service: 02/15/2018 12:30 PM Medical Record Number: [id number] Patient Account Number: [id number] Date of Birth/Sex: 08-29-1941 (77 y.o. M) Treating RN: Nicholas [name] Primary Care Provider: Arnette [name] Other Clinician: Referring Provider: Arnette [name] Treating Provider/Extender: Nicholas III, [name] Weeks in Treatment: 1 Constitutional Well-nourished and  well-hydrated in no acute distress. Respiratory normal breathing without difficulty. Psychiatric this patient is able to make decisions and demonstrates good insight into disease process. Alert and Oriented x 3. pleasant and cooperative. Notes I'm going to suggest at this point we proceed with further debridement of the ulcer to clear away necrotic slough noted on evaluation today. He was in agreement this plan post debridement the wound bed did appear to be doing better still there was no bone exposed which is good news. Especially in light of the negative x-ray. Electronic Signature(s) Signed: 02/16/2018 2:32:11 PM By: Nicholas Keeler PA-C Entered By: Nicholas [name] on 02/15/2018 13:13:58 Duvall, Quavis L. ([id number]) -------------------------------------------------------------------------------- Physician Orders Details Patient Name: [name], [name] Date of Service: 02/15/2018 12:30 PM Medical Record Number: [id number] Patient Account Number: [id number] Date of Birth/Sex: November 22, 1940 (77 y.o. M) Treating RN: Nicholas [name] Primary Care Provider: Arnette [name] Other Clinician: Referring Provider: Arnette [name] Treating Provider/Extender: Nicholas [name] Weeks in Treatment: 1 Verbal / Phone Orders: Yes Clinician: Carolyne Fiscal, Debi Read Back and Verified: Yes Diagnosis Coding ICD-10 Coding Code Description E11.621 Type 2 diabetes mellitus with foot ulcer L97.512 Non-pressure chronic ulcer of other part of right foot with fat layer exposed E11.42 Type 2 diabetes mellitus with diabetic polyneuropathy Wound Cleansing Wound #6 Right,Plantar Toe Great o Clean wound with Normal Saline. Anesthetic (add to Medication List) Wound #6 Right,Plantar Toe Great o Topical Lidocaine 4% cream applied to wound bed prior to debridement (In Clinic Only). Primary Wound Dressing Wound #6  Right,Plantar Toe Great o Silver Alginate Secondary Dressing Wound #6 Right,Plantar Toe Great o Dry Gauze o Conform/Kerlix - felt cut to open over wound then wrap with conform and tape o Foam Dressing Change Frequency Wound #6 Right,Plantar Toe Great o Change dressing every day. Follow-up Appointments Wound #6 Right,Plantar Toe Great o Return Appointment in 1 week. Edema Control Wound #6 Right,Plantar Toe Great o Elevate legs to the level of the heart and pump ankles as often as possible Additional Orders / Instructions Wound #6 Right,Plantar Toe Great o Increase protein intake. [name], [name] ([id number]) Patient Medications Allergies: Sudafed, Betadine Notifications Medication Indication Start End lidocaine DOSE 1 - topical 4 % cream - 1 cream topical Electronic Signature(s) Signed: 02/16/2018 2:32:11 PM By: Nicholas Keeler PA-C Signed: 02/16/2018 4:00:06 PM By: Nicholas [name] Entered By: Nicholas [name] on 02/15/2018 13:08:59 [name], [name] ([id number]) -------------------------------------------------------------------------------- Prescription 02/15/2018 Patient Name: Vanderhoff, Rylon L. Provider: Worthy Keeler PA-C Date of Birth: 1940-11-15 NPI#: [id number] Sex: Valeta Harms: [id number] Phone #: [phone] License #: Patient Address: [facility] [place], [place] [address] [address] [place], [place] [address] [phone] Allergies Sudafed Betadine Medication Medication: Route: Strength: Form: lidocaine 4 % topical cream topical 4% cream Class: TOPICAL LOCAL ANESTHETICS Dose: Frequency / Time: Indication: 1 1 cream topical Number of Refills: Number of Units: 0 Generic Substitution: Start Date: End Date: One Time Use: Substitution Permitted No Note to Pharmacy: Signature(s): Date(s): Electronic Signature(s) Signed: 02/16/2018 2:32:11 PM By:  Nicholas Keeler PA-C Signed: 02/16/2018 4:00:06 PM By: Nicholas [name] Entered By: Nicholas [name] on 02/15/2018 13:09:00 Lor, Baruc L. ([id number]) Gadway, Domnique L. ([id number]) --------------------------------------------------------------------------------  Problem List Details Patient Name: [name], [name] Date of Service: 02/15/2018 12:30 PM Medical Record Number: [id number] Patient Account Number: [id number] Date of Birth/Sex: 1940/12/05 (77 y.o. M) Treating RN: Nicholas [name] Primary Care Provider: Arnette [name] Other Clinician: Referring Provider:  Arnette [name] Treating Provider/Extender: Nicholas [name] Weeks in Treatment: 1 Active Problems ICD-10 Impacting Encounter Code Description Active Date Wound Healing Diagnosis E11.621 Type 2 diabetes mellitus with foot ulcer 02/05/2018 Yes L97.512 Non-pressure chronic ulcer of other part of right foot with fat 02/05/2018 Yes layer exposed E11.42 Type 2 diabetes mellitus with diabetic polyneuropathy 02/05/2018 Yes Inactive Problems Resolved Problems Electronic Signature(s) Signed: 02/16/2018 2:32:11 PM By: Nicholas Keeler PA-C Entered By: Nicholas [name] on 02/15/2018 12:57:23 Cale, Markeis L. ([id number]) -------------------------------------------------------------------------------- Progress Note/History and Physical Details Patient Name: [name], [name] Date of Service: 02/15/2018 12:30 PM Medical Record Number: [id number] Patient Account Number: [id number] Date of Birth/Sex: 06-06-1941 (77 y.o. M) Treating RN: Nicholas [name] Primary Care Provider: Arnette [name] Other Clinician: Referring Provider: Arnette [name] Treating Provider/Extender: Nicholas [name] Weeks in Treatment: 1 Subjective Chief Complaint Information obtained from Patient Right 1st toe ulcer History of Present Illness (HPI) Pleasant 77 year old with history of diabetes (hemoglobin A1c 6.1 in November 2016), peripheral neuropathy, and sleep apnea. No history of PAD. Left ABI  1.01. He developed blisters and subsequent ulcerations on the plantar aspect of his left first and second toe in early December 2016. Denies any trauma, unusual activity, or new footwear. Has been fitted for orthotics in the past but did not like them. He is ambulating per his baseline without difficulty. No claudication or rest pain. 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 X-ray of left foot 09/23/2015 showed no evidence for osteomyelitis. Completed a course of doxycycline. Performing dressing changes with Prisma. Awaiting orthotics at [facility]. He returns to clinic for follow-up and is without new complaints. No significant pain. No fever or chills. No drainage. 01/03/17; this is a patient with type 2 diabetes well controlled with most recent hemoglobin A1c of 5.9. He does however have diabetic peripheral neuropathy which is quite significant. Previously in this clinic with plantar ulcerations of his left first and second toe. He has done reasonably well although 2 weeks ago he noted some bleeding from the medial aspect of his right great toe. His daughter is apparently been working on this and is removed the medial part of the nail cuticle from the nailbed. There is still some bleeding. Because of his diabetic neuropathy status he was concerned enough to come in and see [place] today. He does not have a podiatrist. He does not have known PAD. Last arterial studies were normal but these were in 2013. His ABI in this clinic was 1.0 on the right. 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 He does not wear diabetic shoes however he has been told issues he has are as good as that Readmission: 02/05/18 on evaluation today patient presents for initial evaluation concerning an issue which she first noted last Thursday, four days ago, when he arrived home and noted that he had a lot of blood in his shoe. Subsequently as it turned out it appears that an ulcer on  the plantar aspect of his right great toe. He's not able to really see the bottom of this area and so was not aware of it or any issues with it prior. He states that his daughter cuts his toenails and "checks on his toes" for him. He states this was likely done the weekend before although he cannot remember exactly. He does sometimes where no shoes when in his home. Although not very often. He does not know of any injury that occurred to his great  toe to cause this issue. He has previously had a plantar foot ulcer for which we have treated him but nothing recent. No fevers, chills, nausea, or vomiting noted at this time. Even at this point patient states that he's really having no pain he does have a history of neuropathy. The last hemoglobin A1c that I could see in the system with [place] was actually when you're ago on 01/20/17 and was 7.3. I do not have anything recent other than that. 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 This does require a lot of walking and long days. He's previously had diabetic insoles which they paid quite a bit for although insurance cover the majority of this nonetheless actually made his feet hurt and did not help he does not wear them. 02/15/18 on evaluation today patient appears to be doing rather well in regard to his right great toe ulcer. He has been tolerating the dressing changes without complication. With that being said he did go have the x-ray performed which I did review today and showed no evidence of infection. This is good news. His hemoglobin A1c was also 6.3 and actually appear to be doing fairly well. I was here I think his diabetes is under fairly good control in my opinion. Fortunately appears to have no evidence [name], [name] ([id number]) of worsening  symptoms as far as the ulcer is concerned. Wound History Patient presents with 1 open wound that has been present for approximately 1 week. Patient has been treating wound in the following manner: neosporin. Laboratory tests have not been performed in the last month. Patient reportedly has not tested positive for an antibiotic resistant organism. Patient reportedly has not tested positive for osteomyelitis. Patient reportedly has not had testing performed to evaluate circulation in the legs. Patient History Information obtained from Patient. Family History Cancer - Mother,Father, Diabetes - Mother, Heart Disease - Mother, Hypertension - Mother, No family history of Hereditary Spherocytosis, Kidney Disease, Lung Disease, Seizures, Stroke, Thyroid Problems, Tuberculosis. Social History Former smoker - quit about 25 years ago, Marital Status - Married, Alcohol Use - Never, Drug Use - No History, Caffeine Use - Never. Medical History Eyes Patient has history of Cataracts - 2/18 Denies history of Glaucoma, Optic Neuritis Ear/Nose/Mouth/Throat Denies history of Chronic sinus problems/congestion, Middle ear problems Hematologic/Lymphatic Denies history of Anemia, Hemophilia, Human Immunodeficiency Virus, Lymphedema, Sickle Cell Disease Respiratory Patient has history of Chronic Obstructive Pulmonary Disease (COPD) Denies history of Aspiration, Asthma, Pneumothorax, Sleep Apnea, Tuberculosis Cardiovascular Denies history of Angina, Arrhythmia, Congestive Heart Failure, Coronary Artery Disease, Deep Vein Thrombosis, Hypertension, Hypotension, Myocardial Infarction, Peripheral Arterial Disease, Peripheral Venous Disease, Phlebitis, Vasculitis Gastrointestinal Denies history of Cirrhosis , Colitis, Crohn s, Hepatitis A, Hepatitis B, Hepatitis C Endocrine Patient has history of Type II Diabetes Denies history of Type I Diabetes Immunological Denies history of Lupus Erythematosus, Raynaud s,  Scleroderma Integumentary (Skin) Denies history of History of Burn, History of pressure wounds Musculoskeletal Patient has history of Osteoarthritis - hands Denies history of Gout, Rheumatoid Arthritis, Osteomyelitis Neurologic Patient has history of Neuropathy - hands and feet Denies history of Dementia, Quadriplegia, Paraplegia, Seizure Disorder Oncologic Denies history of Received Chemotherapy, Received Radiation Psychiatric Denies history of Anorexia/bulimia, Confinement Anxiety Patient is treated with Oral Agents. Blood sugar is tested. Blood sugar results noted  at the following times: Breakfast - 174. Hospitalization/Surgery History - 09/09/2012, [facility], small bowel obstruction and colostomy. Franchini, [name] ([id number]) Medical And Surgical History Notes Constitutional Symptoms (General Health) DM II; Genitourinary BPH Review of Systems (ROS) Constitutional Symptoms (General Health) Denies complaints or symptoms of Fever, Chills. Respiratory The patient has no complaints or symptoms. Cardiovascular The patient has no complaints or symptoms. Psychiatric The patient has no complaints or symptoms. Objective Constitutional Well-nourished and well-hydrated in no acute distress. Vitals Time Taken: 12:45 PM, Height: 72 in, Weight: 264 lbs, BMI: 35.8, Temperature: 98.1 F, Pulse: 74 bpm, Respiratory Rate: 18 breaths/min, Blood Pressure: 124/56 mmHg. Respiratory normal breathing without difficulty. Psychiatric this patient is able to make decisions and demonstrates good insight into disease process. Alert and Oriented x 3. pleasant and cooperative. General Notes: I'm going to suggest at this point we proceed with further debridement of the ulcer to clear away necrotic slough noted on evaluation today. He was in agreement this plan post debridement the wound bed did appear to be doing better still there was no bone exposed which is good news. Especially in light of the  negative x-ray. Integumentary (Hair, Skin) Wound #6 status is Open. Original cause of wound was Gradually Appeared. The wound is located on the [employer]. The wound measures 0.7cm length x 0.6cm width x 0.6cm depth; 0.33cm^2 area and 0.198cm^3 volume. There is bone and Fat Layer (Subcutaneous Tissue) Exposed exposed. There is no tunneling or undermining noted. There is a large amount of sanguinous drainage noted. The wound margin is distinct with the outline attached to the wound base. There is medium (34-66%) red granulation within the wound bed. There is a medium (34-66%) amount of necrotic tissue within the wound bed including Adherent Slough. The periwound skin appearance exhibited: Maceration. Periwound temperature was noted as No Abnormality. [name], [name] ([id number]) Assessment Active Problems ICD-10 E11.621 - Type 2 diabetes mellitus with foot ulcer L97.512 - Non-pressure chronic ulcer of other part of right foot with fat layer exposed E11.42 - Type 2 diabetes mellitus with diabetic polyneuropathy Procedures Wound #6 Pre-procedure diagnosis of Wound #6 is a Diabetic Wound/Ulcer of the Lower Extremity located on the Right,Plantar Toe Great .Severity of Tissue Pre Debridement is: Fat layer exposed. There was a Excisional Skin/Subcutaneous Tissue Debridement with a total area of 0.42 sq cm performed by Nicholas III, [name] E., PA-C. With the following instrument(s): Curette. to remove Viable and Non-Viable tissue/material Material removed includes Callus, Subcutaneous Tissue, and Slough, Fibrin/Exudate, and [place] after achieving pain control using Lidocaine 4% Topical Solution. No specimens were taken. A time out was conducted at 13:02, prior to the start of the procedure. A Minimum amount of bleeding was controlled with Pressure. The procedure was tolerated well with a pain level of 0 throughout and a pain level of 0 following the procedure. Patient s Level of Consciousness  post procedure was recorded as Awake and Alert and post-procedure vitals were taken including Temperature: 98.1 F, Pulse: 74 bpm, Respiratory Rate: 18 breaths/min, Blood Pressure: (124)/(56) mmHg. Post Debridement Measurements: 0.7cm length x 0.6cm width x 0.7cm depth; 0.231cm^3 volume. Character of Wound/Ulcer Post Debridement requires further debridement. Severity of Tissue Post Debridement is: Fat layer exposed. Post procedure Diagnosis Wound #6: Same as Pre-Procedure Plan Wound Cleansing: Wound #6 Right,Plantar Toe Great: Clean wound with Normal Saline. Anesthetic (add to Medication List): Wound #6 Right,Plantar Toe Great: Topical Lidocaine 4% cream applied to wound bed prior to debridement (In Clinic Only). Primary  Wound Dressing: Wound #6 Right,Plantar Toe Great: Silver Alginate Secondary Dressing: Wound #6 Right,Plantar Toe Great: Dry Gauze Conform/Kerlix - felt cut to open over wound then wrap with conform and tape Foam Dressing Change Frequency: Wound #6 Right,Plantar Toe Great: Change dressing every day. Follow-up Appointments: Wound #6 Right,Plantar Toe Great: Return Appointment in 1 week. Baugher, Romulo L. ([id number]) Edema Control: Wound #6 Right,Plantar Toe Great: Elevate legs to the level of the heart and pump ankles as often as possible Additional Orders / Instructions: Wound #6 Right,Plantar Toe Great: Increase protein intake. The following medication(s) was prescribed: lidocaine topical 4 % cream 1 1 cream topical was prescribed at facility I'm gonna recommend at this point time that we go ahead and initiate treatment with the silver alginate dressing as we did last week and we will continue this for at least one more week. Subsequently will see were things stand following and then make decisions from there as far as where we go next. Patient is in agreement with this plan. Please see above for specific wound care orders. We will see patient for re-evaluation in 1  week(s) here in the clinic. If anything worsens or changes patient will contact our office for additional recommendations. Electronic Signature(s) Signed: 02/16/2018 2:32:11 PM By: Nicholas Keeler PA-C Entered By: Nicholas [name] on 02/15/2018 13:14:32 Boyson, Kieth L. ([id number]) -------------------------------------------------------------------------------- ROS/PFSH Details Patient Name: Longwell, Daymeon L. Date of Service: 02/15/2018 12:30 PM Medical Record Number: [id number] Patient Account Number: [id number] Date of Birth/Sex: 07-29-1941 (77 y.o. M) Treating RN: Nicholas [name] Primary Care Provider: Arnette [name] Other Clinician: Referring Provider: Arnette [name] Treating Provider/Extender: Nicholas [name] Weeks in Treatment: 1 Label Progress Note Print Version as History and Physical for this encounter Information Obtained From Patient Wound History Do you currently have one or more open woundso Yes How many open wounds do you currently haveo 1 Approximately how long have you had your woundso 1 week How have you been treating your wound(s) until nowo neosporin Has your wound(s) ever healed and then re-openedo No Have you had any lab work done in the past montho No Have you tested positive for an antibiotic resistant organism (MRSA, VRE)o No Have you tested positive for osteomyelitis (bone infection)o No Have you had any tests for circulation on your legso No Constitutional Symptoms (General Health) Complaints and Symptoms: Negative for: Fever; Chills Medical History: Past Medical History Notes: DM II; Eyes Medical History: Positive for: Cataracts - 2/18 Negative for: Glaucoma; Optic Neuritis Ear/Nose/Mouth/Throat Medical History: Negative for: Chronic sinus problems/congestion; Middle ear problems Hematologic/Lymphatic Medical History: Negative for: Anemia; Hemophilia; Human Immunodeficiency Virus; Lymphedema; Sickle Cell Disease Respiratory Complaints and Symptoms: No  Complaints or Symptoms Medical History: Positive for: Chronic Obstructive Pulmonary Disease (COPD) Negative for: Aspiration; Asthma; Pneumothorax; Sleep Apnea; Tuberculosis Cardiovascular Lankford, Dallas L. ([id number]) Complaints and Symptoms: No Complaints or Symptoms Medical History: Negative for: Angina; Arrhythmia; Congestive Heart Failure; Coronary Artery Disease; Deep Vein Thrombosis; Hypertension; Hypotension; Myocardial Infarction; Peripheral Arterial Disease; Peripheral Venous Disease; Phlebitis; Vasculitis Gastrointestinal Medical History: Negative for: Cirrhosis ; Colitis; Crohnos; Hepatitis A; Hepatitis B; Hepatitis C Endocrine Medical History: Positive for: Type II Diabetes Negative for: Type I Diabetes Time with diabetes: 5 years Treated with: Oral agents Blood sugar tested every day: Yes Tested : BID Blood sugar testing results: Breakfast: 174 Genitourinary Medical History: Past Medical History Notes: BPH Immunological Medical History: Negative for: Lupus Erythematosus; Raynaudos; Scleroderma Integumentary (Skin) Medical History: Negative for: History of Burn; History of pressure wounds  Musculoskeletal Medical History: Positive for: Osteoarthritis - hands Negative for: Gout; Rheumatoid Arthritis; Osteomyelitis Neurologic Medical History: Positive for: Neuropathy - hands and feet Negative for: Dementia; Quadriplegia; Paraplegia; Seizure Disorder Oncologic Medical History: Negative for: Received Chemotherapy; Received Radiation Psychiatric Tercero, Yojan L. ([id number]) Complaints and Symptoms: No Complaints or Symptoms Medical History: Negative for: Anorexia/bulimia; Confinement Anxiety HBO Extended History Items Eyes: Cataracts Immunizations Pneumococcal Vaccine: Received Pneumococcal Vaccination: Yes Immunization Notes: up to date Implantable Devices Hospitalization / Surgery History Name of Hospital Purpose of Hospitalization/Surgery Date [facility] small bowel obstruction and colostomy 09/09/2012 Family and Social History Cancer: Yes - Mother,Father; Diabetes: Yes - Mother; Heart Disease: Yes - Mother; Hereditary Spherocytosis: No; Hypertension: Yes - Mother; Kidney Disease: No; Lung Disease: No; Seizures: No; Stroke: No; Thyroid Problems: No; Tuberculosis: No; Former smoker - quit about 25 years ago; Marital Status - Married; Alcohol Use: Never; Drug Use: No History; Caffeine Use: Never; Financial Concerns: No; Food, Clothing or Shelter Needs: No; Support System Lacking: No; Transportation Concerns: No; Advanced Directives: No; Patient does not want information on Advanced Directives Physician Affirmation I have reviewed and agree with the above information. Electronic Signature(s) Signed: 02/16/2018 2:32:11 PM By: Nicholas Keeler PA-C Signed: 02/16/2018 4:00:06 PM By: Nicholas [name] Entered By: Nicholas [name] on 02/15/2018 13:13:40 Muffley, Ferdinand L. ([id number]) -------------------------------------------------------------------------------- SuperBill Details Patient Name: Apostol, Osmel L. Date of Service: 02/15/2018 Medical Record Number: [id number] Patient Account Number: [id number] Date of Birth/Sex: 18-Feb-1941 (77 y.o. M) Treating RN: Nicholas [name] Primary Care Provider: Arnette [name] Other Clinician: Referring Provider: Arnette [name] Treating Provider/Extender: Nicholas [name] Weeks in Treatment: 1 Diagnosis Coding ICD-10 Codes Code Description E11.621 Type 2 diabetes mellitus with foot ulcer L97.512 Non-pressure chronic ulcer of other part of right foot with fat layer exposed E11.42 Type 2 diabetes mellitus with diabetic polyneuropathy Facility Procedures CPT4 Code: 21975883 Description: 25498 - DEB SUBQ TISSUE 20 SQ CM/< ICD-10 Diagnosis Description L97.512 Non-pressure chronic ulcer of other part of right foot with fat Modifier: layer exposed Quantity: 1 Physician Procedures CPT4 Code: 2641583 Description: 11042 - WC  PHYS SUBQ TISS 20 SQ CM ICD-10 Diagnosis Description L97.512 Non-pressure chronic ulcer of other part of right foot with fat Modifier: layer exposed Quantity: 1 Electronic Signature(s) Signed: 02/16/2018 2:32:11 PM By: Nicholas Keeler PA-C Entered By: Nicholas [name] on 02/15/2018 13:14:40

## 2018-02-20 ENCOUNTER — Ambulatory Visit: Payer: Medicare Other | Admitting: Physician Assistant

## 2018-02-24 ENCOUNTER — Other Ambulatory Visit: Payer: Self-pay | Admitting: Family Medicine

## 2018-03-10 DEATH — deceased

## 2018-03-22 ENCOUNTER — Ambulatory Visit: Payer: Medicare Other | Admitting: Internal Medicine

## 2019-02-15 IMAGING — DX DG CERVICAL SPINE COMPLETE 4+V
4 series · 4 of 4 positions shown · non-contrast
Comparison: None.

CLINICAL DATA: Neck pain for 2 weeks with left radiculopathy.

EXAM:
CERVICAL SPINE - COMPLETE 4+ VIEW

[c-spine lat]
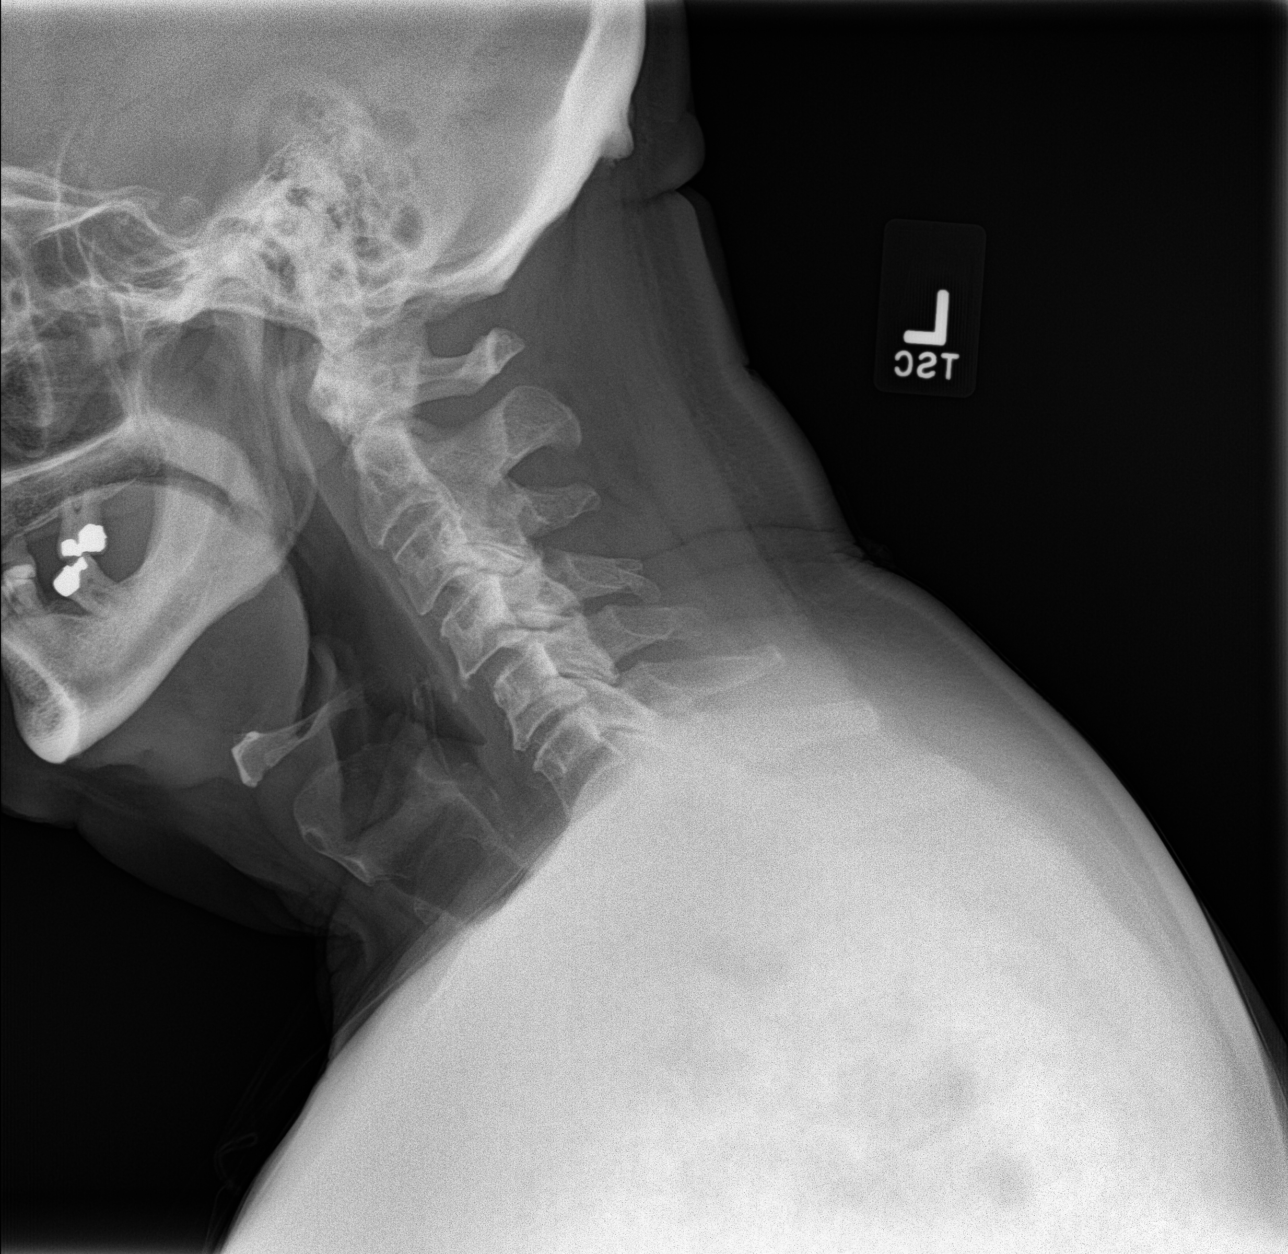

[c-spine ap]
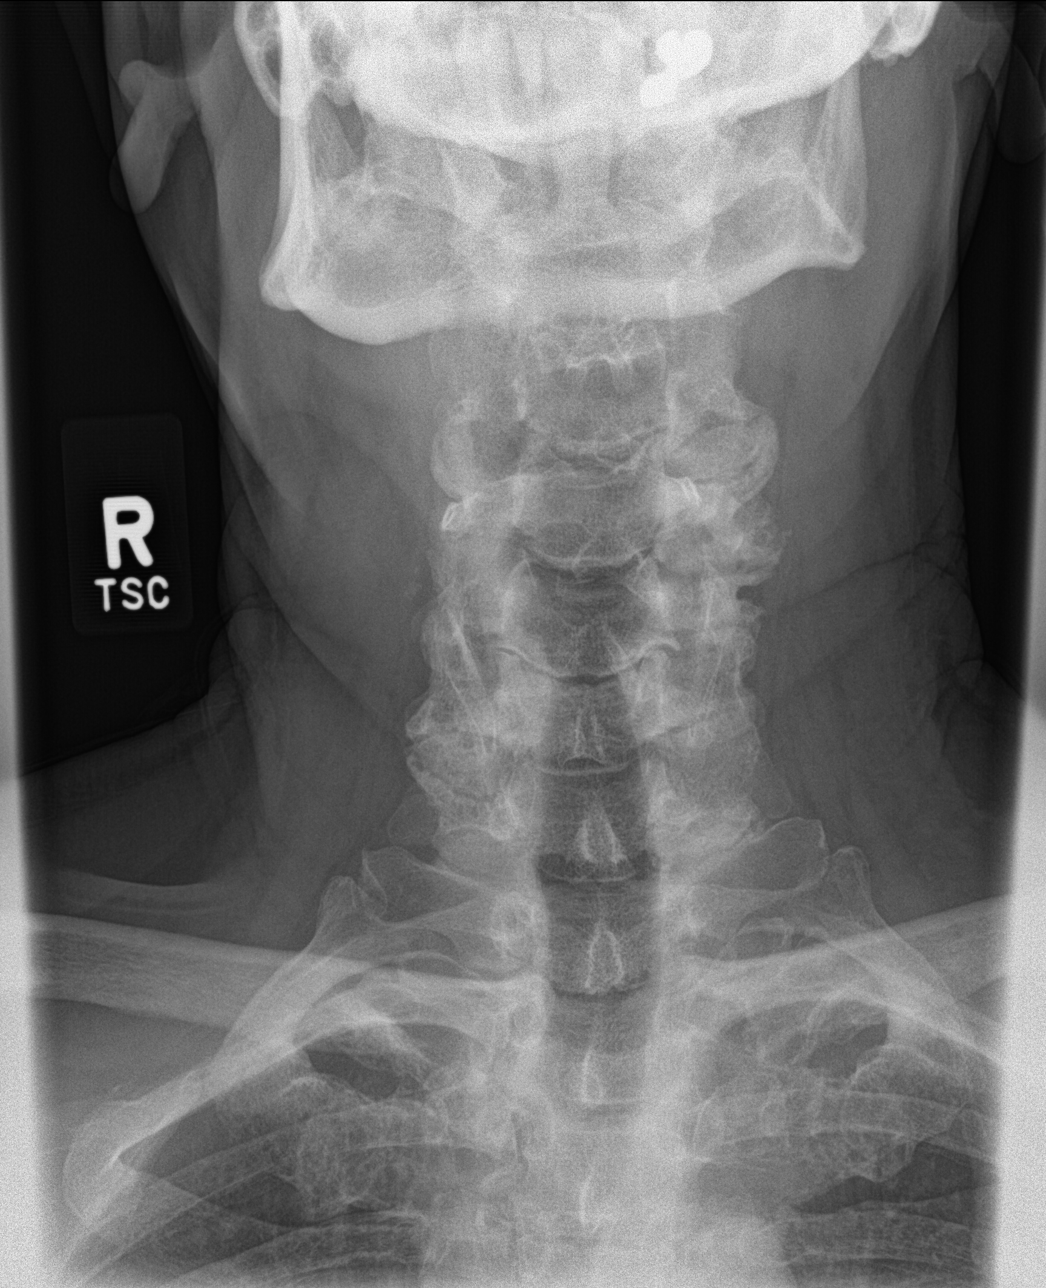

[c-spine open mouth]
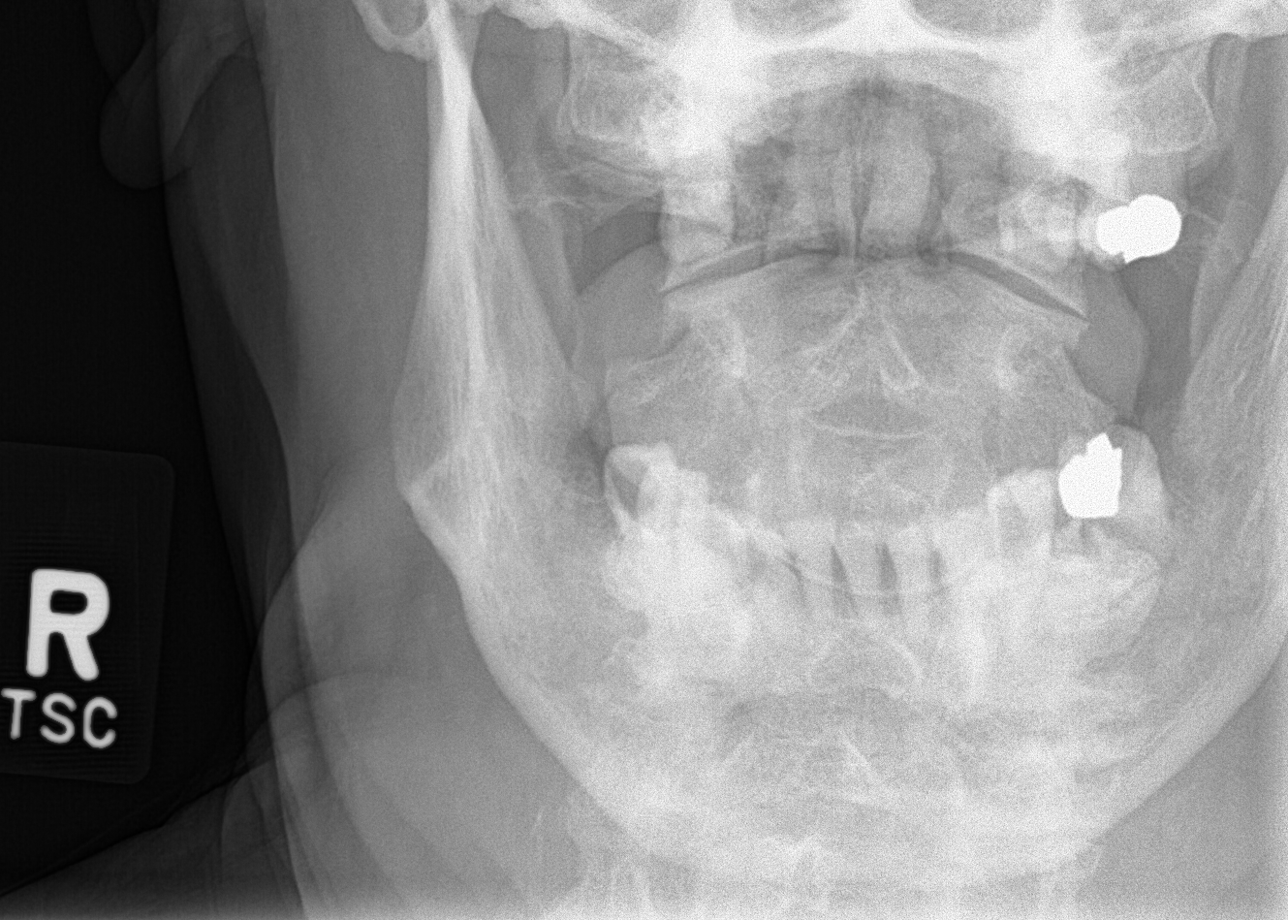

[c-spine swimmers]
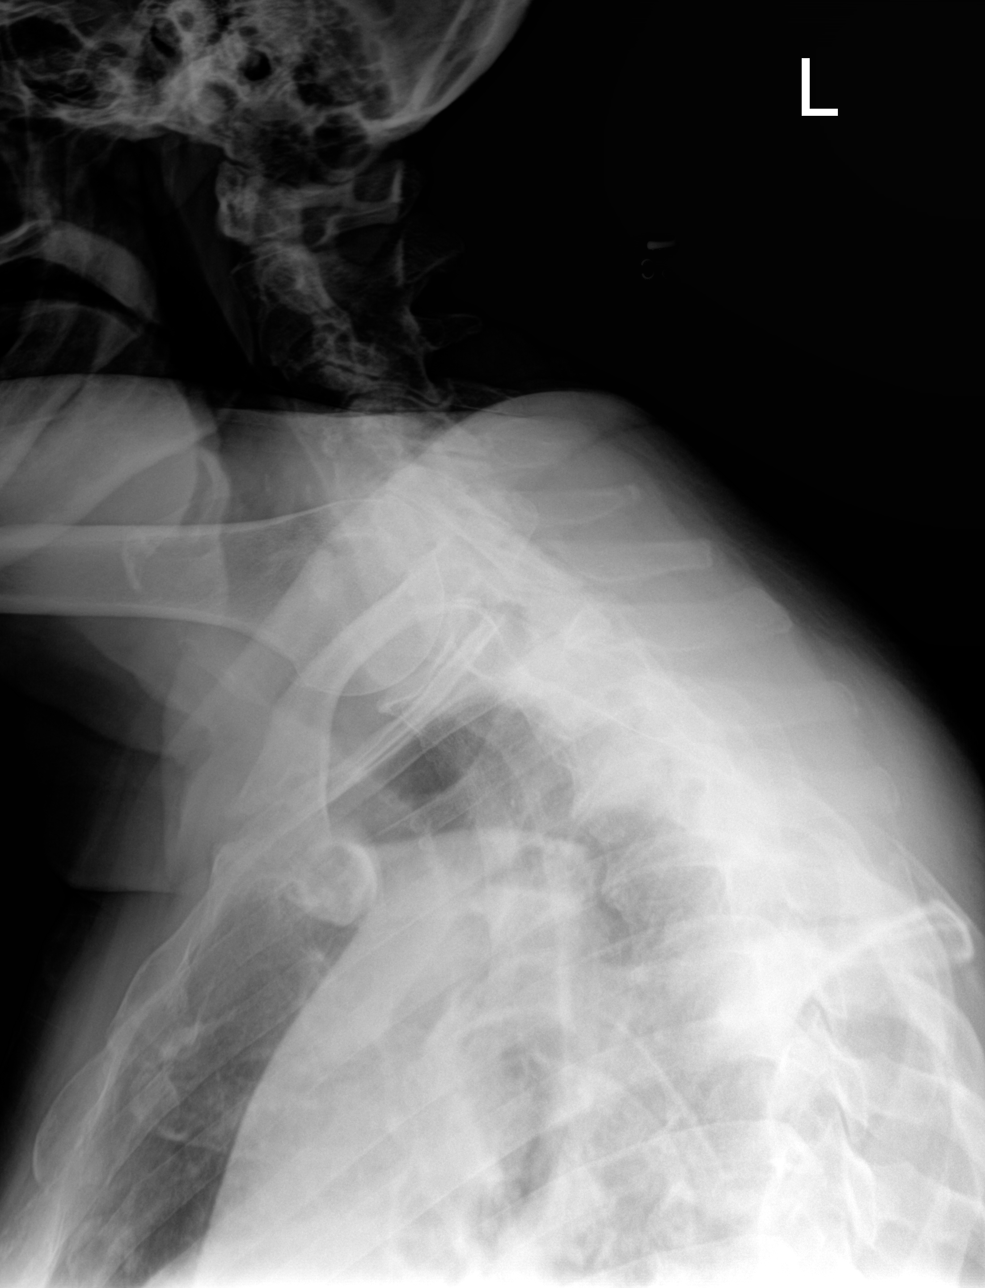

[4 of 4 positions shown; findings below may reference images not displayed]

FINDINGS: There is no evidence of cervical spine fracture or prevertebral soft
tissue swelling. Alignment is normal. Degenerative joint changes
with narrowed joint space and osteophyte formation are identified in
the mid to lower cervical spine.
IMPRESSION: No acute fracture or dislocation. Mild degenerative joint changes of
cervical spine.

## 2022-06-17 ENCOUNTER — Encounter: Payer: Self-pay | Admitting: Internal Medicine
